# Patient Record
Sex: Female | Born: 1940
Health system: Southern US, Community
[De-identification: ages and names within clinical notes are randomized; demographics above are authoritative.]

## PROBLEM LIST (undated history)

## (undated) DIAGNOSIS — K219 Gastro-esophageal reflux disease without esophagitis: Secondary | ICD-10-CM

## (undated) DIAGNOSIS — C801 Malignant (primary) neoplasm, unspecified: Secondary | ICD-10-CM

## (undated) DIAGNOSIS — E039 Hypothyroidism, unspecified: Secondary | ICD-10-CM

## (undated) DIAGNOSIS — Z923 Personal history of irradiation: Secondary | ICD-10-CM

## (undated) DIAGNOSIS — F319 Bipolar disorder, unspecified: Secondary | ICD-10-CM

## (undated) DIAGNOSIS — C50919 Malignant neoplasm of unspecified site of unspecified female breast: Secondary | ICD-10-CM

## (undated) DIAGNOSIS — J449 Chronic obstructive pulmonary disease, unspecified: Secondary | ICD-10-CM

## (undated) DIAGNOSIS — Z950 Presence of cardiac pacemaker: Secondary | ICD-10-CM

## (undated) DIAGNOSIS — F329 Major depressive disorder, single episode, unspecified: Secondary | ICD-10-CM

## (undated) DIAGNOSIS — I2510 Atherosclerotic heart disease of native coronary artery without angina pectoris: Secondary | ICD-10-CM

## (undated) DIAGNOSIS — E119 Type 2 diabetes mellitus without complications: Secondary | ICD-10-CM

## (undated) DIAGNOSIS — E210 Primary hyperparathyroidism: Secondary | ICD-10-CM

## (undated) DIAGNOSIS — I4891 Unspecified atrial fibrillation: Secondary | ICD-10-CM

## (undated) DIAGNOSIS — K750 Abscess of liver: Secondary | ICD-10-CM

## (undated) DIAGNOSIS — E559 Vitamin D deficiency, unspecified: Secondary | ICD-10-CM

## (undated) DIAGNOSIS — F17200 Nicotine dependence, unspecified, uncomplicated: Secondary | ICD-10-CM

## (undated) DIAGNOSIS — F3289 Other specified depressive episodes: Secondary | ICD-10-CM

## (undated) DIAGNOSIS — E785 Hyperlipidemia, unspecified: Secondary | ICD-10-CM

## (undated) DIAGNOSIS — I442 Atrioventricular block, complete: Secondary | ICD-10-CM

## (undated) DIAGNOSIS — E215 Disorder of parathyroid gland, unspecified: Secondary | ICD-10-CM

## (undated) DIAGNOSIS — I5181 Takotsubo syndrome: Secondary | ICD-10-CM

## (undated) DIAGNOSIS — I10 Essential (primary) hypertension: Secondary | ICD-10-CM

## (undated) HISTORY — PX: BREAST BIOPSY: SHX20

## (undated) HISTORY — DX: Atherosclerotic heart disease of native coronary artery without angina pectoris: I25.10

## (undated) HISTORY — DX: Hypothyroidism, unspecified: E03.9

## (undated) HISTORY — DX: Type 2 diabetes mellitus without complications: E11.9

## (undated) HISTORY — DX: Takotsubo syndrome: I51.81

## (undated) HISTORY — PX: ABDOMINAL HYSTERECTOMY: SHX81

## (undated) HISTORY — DX: Other specified depressive episodes: F32.89

## (undated) HISTORY — DX: Essential (primary) hypertension: I10

## (undated) HISTORY — DX: Vitamin D deficiency, unspecified: E55.9

## (undated) HISTORY — DX: Abscess of liver: K75.0

## (undated) HISTORY — DX: Unspecified atrial fibrillation: I48.91

## (undated) HISTORY — DX: Major depressive disorder, single episode, unspecified: F32.9

## (undated) HISTORY — DX: Bipolar disorder, unspecified: F31.9

## (undated) HISTORY — DX: Chronic obstructive pulmonary disease, unspecified: J44.9

## (undated) HISTORY — DX: Hypercalcemia: E83.52

## (undated) HISTORY — PX: INSERT / REPLACE / REMOVE PACEMAKER: SUR710

## (undated) HISTORY — PX: CARDIAC CATHETERIZATION: SHX172

## (undated) HISTORY — DX: Primary hyperparathyroidism: E21.0

## (undated) HISTORY — DX: Atrioventricular block, complete: I44.2

## (undated) HISTORY — PX: PARATHYROIDECTOMY: SHX19

## (undated) HISTORY — DX: Malignant neoplasm of unspecified site of unspecified female breast: C50.919

## (undated) HISTORY — DX: Hyperlipidemia, unspecified: E78.5

## (undated) HISTORY — DX: Presence of cardiac pacemaker: Z95.0

## (undated) HISTORY — DX: Gastro-esophageal reflux disease without esophagitis: K21.9

## (undated) HISTORY — PX: BREAST LUMPECTOMY: SHX2

## (undated) HISTORY — DX: Nicotine dependence, unspecified, uncomplicated: F17.200

## (undated) HISTORY — DX: Disorder of parathyroid gland, unspecified: E21.5

---

## 1998-09-14 ENCOUNTER — Ambulatory Visit (HOSPITAL_COMMUNITY): Admission: RE | Admit: 1998-09-14 | Discharge: 1998-09-14 | Payer: Self-pay | Admitting: Endocrinology

## 1998-09-14 ENCOUNTER — Encounter: Payer: Self-pay | Admitting: Endocrinology

## 1999-05-23 ENCOUNTER — Ambulatory Visit (HOSPITAL_COMMUNITY): Admission: RE | Admit: 1999-05-23 | Discharge: 1999-05-23 | Payer: Self-pay | Admitting: Gastroenterology

## 1999-05-23 ENCOUNTER — Encounter: Payer: Self-pay | Admitting: Gastroenterology

## 1999-06-06 ENCOUNTER — Other Ambulatory Visit: Admission: RE | Admit: 1999-06-06 | Discharge: 1999-06-06 | Payer: Self-pay | Admitting: Gastroenterology

## 1999-06-06 ENCOUNTER — Encounter (INDEPENDENT_AMBULATORY_CARE_PROVIDER_SITE_OTHER): Payer: Self-pay | Admitting: Specialist

## 1999-12-26 ENCOUNTER — Ambulatory Visit (HOSPITAL_COMMUNITY): Admission: RE | Admit: 1999-12-26 | Discharge: 1999-12-27 | Payer: Self-pay | Admitting: Internal Medicine

## 1999-12-27 ENCOUNTER — Encounter: Payer: Self-pay | Admitting: Internal Medicine

## 2000-05-11 ENCOUNTER — Ambulatory Visit (HOSPITAL_COMMUNITY): Admission: RE | Admit: 2000-05-11 | Discharge: 2000-05-11 | Payer: Self-pay | Admitting: Internal Medicine

## 2000-05-11 ENCOUNTER — Encounter: Payer: Self-pay | Admitting: Internal Medicine

## 2000-07-11 ENCOUNTER — Ambulatory Visit (HOSPITAL_COMMUNITY): Admission: RE | Admit: 2000-07-11 | Discharge: 2000-07-11 | Payer: Self-pay | Admitting: Specialist

## 2000-10-01 ENCOUNTER — Ambulatory Visit (HOSPITAL_COMMUNITY): Admission: RE | Admit: 2000-10-01 | Discharge: 2000-10-01 | Payer: Self-pay | Admitting: Specialist

## 2002-04-04 ENCOUNTER — Encounter: Payer: Self-pay | Admitting: Internal Medicine

## 2002-04-04 ENCOUNTER — Inpatient Hospital Stay (HOSPITAL_COMMUNITY): Admission: RE | Admit: 2002-04-04 | Discharge: 2002-04-11 | Payer: Self-pay | Admitting: Internal Medicine

## 2002-04-05 ENCOUNTER — Encounter: Payer: Self-pay | Admitting: Internal Medicine

## 2002-04-11 ENCOUNTER — Encounter: Payer: Self-pay | Admitting: Internal Medicine

## 2002-07-08 ENCOUNTER — Ambulatory Visit (HOSPITAL_COMMUNITY): Admission: RE | Admit: 2002-07-08 | Discharge: 2002-07-08 | Payer: Self-pay | Admitting: Family Medicine

## 2003-02-03 ENCOUNTER — Encounter: Admission: RE | Admit: 2003-02-03 | Discharge: 2003-02-03 | Payer: Self-pay | Admitting: Obstetrics and Gynecology

## 2003-04-23 ENCOUNTER — Ambulatory Visit (HOSPITAL_COMMUNITY): Admission: RE | Admit: 2003-04-23 | Discharge: 2003-04-23 | Payer: Self-pay | Admitting: Internal Medicine

## 2003-04-23 ENCOUNTER — Encounter: Payer: Self-pay | Admitting: Internal Medicine

## 2003-05-08 ENCOUNTER — Encounter: Admission: RE | Admit: 2003-05-08 | Discharge: 2003-05-08 | Payer: Self-pay | Admitting: Family Medicine

## 2003-08-11 ENCOUNTER — Encounter: Admission: RE | Admit: 2003-08-11 | Discharge: 2003-08-11 | Payer: Self-pay | Admitting: Obstetrics and Gynecology

## 2004-04-12 ENCOUNTER — Ambulatory Visit: Payer: Self-pay | Admitting: Internal Medicine

## 2004-04-12 ENCOUNTER — Ambulatory Visit: Payer: Self-pay | Admitting: *Deleted

## 2004-04-13 ENCOUNTER — Ambulatory Visit: Payer: Self-pay | Admitting: Internal Medicine

## 2004-04-15 ENCOUNTER — Ambulatory Visit: Payer: Self-pay | Admitting: *Deleted

## 2004-04-15 ENCOUNTER — Ambulatory Visit: Payer: Self-pay | Admitting: Internal Medicine

## 2004-04-20 ENCOUNTER — Ambulatory Visit (HOSPITAL_COMMUNITY): Admission: RE | Admit: 2004-04-20 | Discharge: 2004-04-20 | Payer: Self-pay | Admitting: *Deleted

## 2004-05-16 ENCOUNTER — Ambulatory Visit: Payer: Self-pay | Admitting: Internal Medicine

## 2004-06-07 ENCOUNTER — Ambulatory Visit: Payer: Self-pay | Admitting: Internal Medicine

## 2004-07-07 ENCOUNTER — Ambulatory Visit: Payer: Self-pay | Admitting: Internal Medicine

## 2004-07-26 ENCOUNTER — Ambulatory Visit: Payer: Self-pay | Admitting: Internal Medicine

## 2004-08-08 ENCOUNTER — Ambulatory Visit: Payer: Self-pay | Admitting: Internal Medicine

## 2004-08-12 ENCOUNTER — Ambulatory Visit: Payer: Self-pay | Admitting: Internal Medicine

## 2004-08-19 ENCOUNTER — Ambulatory Visit: Payer: Self-pay | Admitting: Internal Medicine

## 2004-08-25 ENCOUNTER — Ambulatory Visit: Payer: Self-pay | Admitting: Internal Medicine

## 2004-09-07 ENCOUNTER — Ambulatory Visit: Payer: Self-pay | Admitting: Internal Medicine

## 2004-09-08 ENCOUNTER — Ambulatory Visit: Payer: Self-pay | Admitting: Internal Medicine

## 2004-10-05 ENCOUNTER — Ambulatory Visit: Payer: Self-pay | Admitting: Internal Medicine

## 2004-11-07 ENCOUNTER — Ambulatory Visit: Payer: Self-pay | Admitting: Internal Medicine

## 2004-12-05 ENCOUNTER — Ambulatory Visit: Payer: Self-pay | Admitting: Internal Medicine

## 2004-12-14 ENCOUNTER — Ambulatory Visit: Payer: Self-pay | Admitting: Internal Medicine

## 2004-12-19 ENCOUNTER — Ambulatory Visit: Payer: Self-pay | Admitting: Internal Medicine

## 2005-01-09 ENCOUNTER — Ambulatory Visit: Payer: Self-pay | Admitting: Internal Medicine

## 2005-01-16 ENCOUNTER — Ambulatory Visit: Payer: Self-pay | Admitting: Internal Medicine

## 2005-02-06 ENCOUNTER — Ambulatory Visit: Payer: Self-pay | Admitting: Nurse Practitioner

## 2005-02-15 ENCOUNTER — Ambulatory Visit: Payer: Self-pay | Admitting: Internal Medicine

## 2005-03-27 ENCOUNTER — Ambulatory Visit: Payer: Self-pay | Admitting: Nurse Practitioner

## 2005-04-27 ENCOUNTER — Ambulatory Visit: Payer: Self-pay | Admitting: Nurse Practitioner

## 2005-04-27 ENCOUNTER — Ambulatory Visit: Payer: Self-pay | Admitting: Internal Medicine

## 2005-05-03 ENCOUNTER — Ambulatory Visit: Payer: Self-pay | Admitting: Nurse Practitioner

## 2005-05-16 ENCOUNTER — Ambulatory Visit (HOSPITAL_COMMUNITY): Admission: RE | Admit: 2005-05-16 | Discharge: 2005-05-16 | Payer: Self-pay | Admitting: Internal Medicine

## 2005-05-18 ENCOUNTER — Ambulatory Visit: Payer: Self-pay | Admitting: Nurse Practitioner

## 2005-05-24 ENCOUNTER — Ambulatory Visit: Payer: Self-pay | Admitting: Family Medicine

## 2005-06-05 ENCOUNTER — Ambulatory Visit (HOSPITAL_COMMUNITY): Admission: RE | Admit: 2005-06-05 | Discharge: 2005-06-05 | Payer: Self-pay | Admitting: Internal Medicine

## 2005-06-19 ENCOUNTER — Ambulatory Visit: Payer: Self-pay | Admitting: Nurse Practitioner

## 2005-06-21 ENCOUNTER — Ambulatory Visit: Payer: Self-pay | Admitting: Nurse Practitioner

## 2005-07-04 ENCOUNTER — Ambulatory Visit: Payer: Self-pay | Admitting: Nurse Practitioner

## 2005-07-19 ENCOUNTER — Ambulatory Visit: Payer: Self-pay | Admitting: Nurse Practitioner

## 2005-07-20 ENCOUNTER — Ambulatory Visit: Payer: Self-pay | Admitting: Nurse Practitioner

## 2005-08-03 ENCOUNTER — Ambulatory Visit: Payer: Self-pay | Admitting: Nurse Practitioner

## 2005-08-10 ENCOUNTER — Ambulatory Visit: Payer: Self-pay | Admitting: Internal Medicine

## 2005-08-12 ENCOUNTER — Emergency Department (HOSPITAL_COMMUNITY): Admission: EM | Admit: 2005-08-12 | Discharge: 2005-08-12 | Payer: Self-pay | Admitting: Emergency Medicine

## 2005-08-14 ENCOUNTER — Ambulatory Visit: Payer: Self-pay | Admitting: Nurse Practitioner

## 2005-08-17 ENCOUNTER — Ambulatory Visit: Payer: Self-pay | Admitting: Nurse Practitioner

## 2005-08-28 ENCOUNTER — Ambulatory Visit: Payer: Self-pay | Admitting: Internal Medicine

## 2005-09-04 ENCOUNTER — Ambulatory Visit (HOSPITAL_COMMUNITY): Admission: RE | Admit: 2005-09-04 | Discharge: 2005-09-04 | Payer: Self-pay | Admitting: *Deleted

## 2005-09-04 ENCOUNTER — Ambulatory Visit: Payer: Self-pay | Admitting: "Endocrinology

## 2005-09-08 ENCOUNTER — Ambulatory Visit: Payer: Self-pay | Admitting: Nurse Practitioner

## 2005-09-11 ENCOUNTER — Encounter (HOSPITAL_COMMUNITY): Admission: RE | Admit: 2005-09-11 | Discharge: 2005-12-10 | Payer: Self-pay | Admitting: "Endocrinology

## 2005-09-25 ENCOUNTER — Ambulatory Visit: Payer: Self-pay | Admitting: Nurse Practitioner

## 2005-10-10 ENCOUNTER — Ambulatory Visit: Payer: Self-pay | Admitting: "Endocrinology

## 2005-10-16 ENCOUNTER — Ambulatory Visit: Payer: Self-pay | Admitting: Nurse Practitioner

## 2005-10-17 ENCOUNTER — Ambulatory Visit: Payer: Self-pay | Admitting: Nurse Practitioner

## 2005-11-02 ENCOUNTER — Ambulatory Visit: Payer: Self-pay | Admitting: Internal Medicine

## 2005-11-16 ENCOUNTER — Ambulatory Visit (HOSPITAL_COMMUNITY): Admission: RE | Admit: 2005-11-16 | Discharge: 2005-11-16 | Payer: Self-pay | Admitting: Internal Medicine

## 2005-11-16 ENCOUNTER — Ambulatory Visit: Payer: Self-pay | Admitting: Cardiology

## 2005-11-21 ENCOUNTER — Ambulatory Visit: Payer: Self-pay | Admitting: Nurse Practitioner

## 2005-12-07 ENCOUNTER — Ambulatory Visit (HOSPITAL_COMMUNITY): Admission: RE | Admit: 2005-12-07 | Discharge: 2005-12-08 | Payer: Self-pay | Admitting: Surgery

## 2005-12-07 ENCOUNTER — Encounter (INDEPENDENT_AMBULATORY_CARE_PROVIDER_SITE_OTHER): Payer: Self-pay | Admitting: *Deleted

## 2005-12-20 ENCOUNTER — Ambulatory Visit: Payer: Self-pay | Admitting: "Endocrinology

## 2005-12-21 ENCOUNTER — Ambulatory Visit: Payer: Self-pay | Admitting: Nurse Practitioner

## 2005-12-26 ENCOUNTER — Ambulatory Visit (HOSPITAL_COMMUNITY): Admission: RE | Admit: 2005-12-26 | Discharge: 2005-12-26 | Payer: Self-pay | Admitting: "Endocrinology

## 2006-01-23 ENCOUNTER — Ambulatory Visit: Payer: Self-pay | Admitting: Cardiology

## 2006-01-30 ENCOUNTER — Ambulatory Visit: Payer: Self-pay | Admitting: Gastroenterology

## 2006-01-31 ENCOUNTER — Ambulatory Visit: Payer: Self-pay | Admitting: Internal Medicine

## 2006-02-05 ENCOUNTER — Ambulatory Visit: Payer: Self-pay | Admitting: Cardiovascular Disease

## 2006-02-05 ENCOUNTER — Ambulatory Visit: Payer: Self-pay | Admitting: Internal Medicine

## 2006-02-23 ENCOUNTER — Ambulatory Visit: Payer: Self-pay | Admitting: Gastroenterology

## 2006-02-28 ENCOUNTER — Ambulatory Visit: Payer: Self-pay | Admitting: Gastroenterology

## 2006-02-28 ENCOUNTER — Encounter (INDEPENDENT_AMBULATORY_CARE_PROVIDER_SITE_OTHER): Payer: Self-pay | Admitting: *Deleted

## 2006-02-28 LAB — HM COLONOSCOPY

## 2006-03-06 ENCOUNTER — Ambulatory Visit: Payer: Self-pay | Admitting: Cardiology

## 2006-03-07 ENCOUNTER — Ambulatory Visit: Payer: Self-pay | Admitting: "Endocrinology

## 2006-04-02 ENCOUNTER — Ambulatory Visit: Payer: Self-pay | Admitting: Internal Medicine

## 2006-04-03 ENCOUNTER — Ambulatory Visit: Payer: Self-pay | Admitting: *Deleted

## 2006-04-16 ENCOUNTER — Ambulatory Visit: Payer: Self-pay | Admitting: Internal Medicine

## 2006-04-26 ENCOUNTER — Ambulatory Visit: Payer: Self-pay | Admitting: Gastroenterology

## 2006-05-01 ENCOUNTER — Ambulatory Visit: Payer: Self-pay | Admitting: Cardiovascular Disease

## 2006-05-16 ENCOUNTER — Ambulatory Visit: Payer: Self-pay | Admitting: Cardiology

## 2006-05-25 ENCOUNTER — Ambulatory Visit: Payer: Self-pay | Admitting: Internal Medicine

## 2006-05-25 ENCOUNTER — Ambulatory Visit: Payer: Self-pay | Admitting: Cardiology

## 2006-05-25 LAB — CONVERTED CEMR LAB
Chol/HDL Ratio, serum: 6.2
Cholesterol: 270 mg/dL (ref 0–200)
HDL: 43.4 mg/dL (ref >39.0)
Triglyceride fasting, serum: 414 mg/dL (ref 0–149)
VLDL: 83 mg/dL — ABNORMAL HIGH (ref 0–40)

## 2006-06-07 ENCOUNTER — Ambulatory Visit: Payer: Self-pay | Admitting: "Endocrinology

## 2006-06-08 ENCOUNTER — Ambulatory Visit: Payer: Self-pay | Admitting: Cardiology

## 2006-06-19 ENCOUNTER — Ambulatory Visit (HOSPITAL_COMMUNITY): Admission: RE | Admit: 2006-06-19 | Discharge: 2006-06-19 | Payer: Self-pay | Admitting: Internal Medicine

## 2006-06-29 ENCOUNTER — Ambulatory Visit: Payer: Self-pay | Admitting: Cardiology

## 2006-07-02 ENCOUNTER — Encounter: Admission: RE | Admit: 2006-07-02 | Discharge: 2006-07-02 | Payer: Self-pay | Admitting: Internal Medicine

## 2006-07-05 ENCOUNTER — Ambulatory Visit: Payer: Self-pay | Admitting: Internal Medicine

## 2006-07-05 ENCOUNTER — Ambulatory Visit (HOSPITAL_COMMUNITY): Admission: RE | Admit: 2006-07-05 | Discharge: 2006-07-05 | Payer: Self-pay | Admitting: Internal Medicine

## 2006-07-12 ENCOUNTER — Ambulatory Visit: Payer: Self-pay | Admitting: Cardiovascular Disease

## 2006-07-27 ENCOUNTER — Ambulatory Visit: Payer: Self-pay | Admitting: Cardiology

## 2006-07-27 ENCOUNTER — Ambulatory Visit: Payer: Self-pay

## 2006-08-16 ENCOUNTER — Ambulatory Visit: Payer: Self-pay | Admitting: Cardiology

## 2006-09-06 ENCOUNTER — Ambulatory Visit: Payer: Self-pay | Admitting: Cardiology

## 2006-09-14 ENCOUNTER — Ambulatory Visit: Payer: Self-pay | Admitting: "Endocrinology

## 2006-09-27 ENCOUNTER — Ambulatory Visit: Payer: Self-pay | Admitting: Cardiology

## 2006-10-18 ENCOUNTER — Ambulatory Visit: Payer: Self-pay | Admitting: Cardiology

## 2006-10-31 ENCOUNTER — Ambulatory Visit: Payer: Self-pay | Admitting: Internal Medicine

## 2006-11-15 ENCOUNTER — Ambulatory Visit: Payer: Self-pay | Admitting: Internal Medicine

## 2006-11-21 ENCOUNTER — Ambulatory Visit: Payer: Self-pay | Admitting: Internal Medicine

## 2006-11-29 ENCOUNTER — Ambulatory Visit: Payer: Self-pay | Admitting: Cardiology

## 2006-12-13 ENCOUNTER — Ambulatory Visit: Payer: Self-pay | Admitting: "Endocrinology

## 2006-12-13 ENCOUNTER — Ambulatory Visit: Payer: Self-pay | Admitting: Cardiology

## 2006-12-27 ENCOUNTER — Ambulatory Visit: Payer: Self-pay | Admitting: Cardiology

## 2006-12-28 ENCOUNTER — Emergency Department (HOSPITAL_COMMUNITY): Admission: EM | Admit: 2006-12-28 | Discharge: 2006-12-28 | Payer: Self-pay | Admitting: *Deleted

## 2006-12-29 ENCOUNTER — Encounter: Payer: Self-pay | Admitting: Internal Medicine

## 2006-12-29 ENCOUNTER — Inpatient Hospital Stay (HOSPITAL_COMMUNITY): Admission: EM | Admit: 2006-12-29 | Discharge: 2007-01-02 | Payer: Self-pay | Admitting: Emergency Medicine

## 2006-12-29 ENCOUNTER — Ambulatory Visit: Payer: Self-pay | Admitting: *Deleted

## 2007-01-05 ENCOUNTER — Ambulatory Visit: Payer: Self-pay | Admitting: Family Medicine

## 2007-01-08 ENCOUNTER — Ambulatory Visit: Payer: Self-pay | Admitting: Cardiology

## 2007-01-18 ENCOUNTER — Encounter: Payer: Self-pay | Admitting: Internal Medicine

## 2007-01-18 ENCOUNTER — Ambulatory Visit: Payer: Self-pay

## 2007-01-23 ENCOUNTER — Ambulatory Visit: Payer: Self-pay | Admitting: Cardiology

## 2007-02-06 ENCOUNTER — Ambulatory Visit: Payer: Self-pay | Admitting: Cardiology

## 2007-02-06 ENCOUNTER — Ambulatory Visit: Payer: Self-pay | Admitting: Internal Medicine

## 2007-02-27 ENCOUNTER — Ambulatory Visit: Payer: Self-pay | Admitting: Cardiology

## 2007-03-27 ENCOUNTER — Ambulatory Visit: Payer: Self-pay | Admitting: Cardiology

## 2007-04-04 ENCOUNTER — Ambulatory Visit: Payer: Self-pay | Admitting: "Endocrinology

## 2007-04-19 ENCOUNTER — Ambulatory Visit: Payer: Self-pay | Admitting: Internal Medicine

## 2007-04-24 ENCOUNTER — Ambulatory Visit: Payer: Self-pay | Admitting: Internal Medicine

## 2007-05-16 ENCOUNTER — Ambulatory Visit: Payer: Self-pay | Admitting: Cardiology

## 2007-05-31 ENCOUNTER — Ambulatory Visit: Payer: Self-pay | Admitting: Cardiovascular Disease

## 2007-06-28 ENCOUNTER — Ambulatory Visit: Payer: Self-pay | Admitting: Internal Medicine

## 2007-07-12 ENCOUNTER — Ambulatory Visit: Payer: Self-pay | Admitting: Cardiology

## 2007-07-17 ENCOUNTER — Ambulatory Visit: Payer: Self-pay | Admitting: Internal Medicine

## 2007-07-23 ENCOUNTER — Ambulatory Visit: Payer: Self-pay | Admitting: "Endocrinology

## 2007-08-13 ENCOUNTER — Ambulatory Visit: Payer: Self-pay | Admitting: Cardiology

## 2007-09-03 ENCOUNTER — Ambulatory Visit: Payer: Self-pay | Admitting: Cardiovascular Disease

## 2007-09-11 ENCOUNTER — Ambulatory Visit: Payer: Self-pay | Admitting: Cardiology

## 2007-09-11 ENCOUNTER — Inpatient Hospital Stay (HOSPITAL_COMMUNITY): Admission: EM | Admit: 2007-09-11 | Discharge: 2007-09-17 | Payer: Self-pay | Admitting: Emergency Medicine

## 2007-09-12 ENCOUNTER — Encounter: Payer: Self-pay | Admitting: Cardiology

## 2007-10-02 ENCOUNTER — Ambulatory Visit: Payer: Self-pay | Admitting: Cardiology

## 2007-10-16 ENCOUNTER — Ambulatory Visit: Payer: Self-pay | Admitting: Cardiology

## 2007-11-06 ENCOUNTER — Ambulatory Visit: Payer: Self-pay | Admitting: "Endocrinology

## 2007-11-18 ENCOUNTER — Ambulatory Visit: Payer: Self-pay | Admitting: Internal Medicine

## 2007-11-20 ENCOUNTER — Ambulatory Visit: Payer: Self-pay | Admitting: Cardiology

## 2007-12-16 ENCOUNTER — Ambulatory Visit: Payer: Self-pay | Admitting: Cardiology

## 2007-12-17 ENCOUNTER — Encounter: Payer: Self-pay | Admitting: Internal Medicine

## 2008-01-01 ENCOUNTER — Encounter: Payer: Self-pay | Admitting: Internal Medicine

## 2008-01-13 ENCOUNTER — Ambulatory Visit: Payer: Self-pay | Admitting: Cardiology

## 2008-01-24 ENCOUNTER — Encounter: Payer: Self-pay | Admitting: Internal Medicine

## 2008-01-27 ENCOUNTER — Ambulatory Visit: Payer: Self-pay | Admitting: Cardiology

## 2008-02-17 ENCOUNTER — Ambulatory Visit: Payer: Self-pay | Admitting: Internal Medicine

## 2008-02-20 ENCOUNTER — Ambulatory Visit: Payer: Self-pay | Admitting: "Endocrinology

## 2008-02-23 ENCOUNTER — Encounter: Payer: Self-pay | Admitting: Gastroenterology

## 2008-02-24 ENCOUNTER — Encounter: Payer: Self-pay | Admitting: Gastroenterology

## 2008-02-25 ENCOUNTER — Telehealth (INDEPENDENT_AMBULATORY_CARE_PROVIDER_SITE_OTHER): Payer: Self-pay | Admitting: *Deleted

## 2008-02-25 ENCOUNTER — Inpatient Hospital Stay (HOSPITAL_COMMUNITY): Admission: AD | Admit: 2008-02-25 | Discharge: 2008-03-06 | Payer: Self-pay | Admitting: Internal Medicine

## 2008-02-25 ENCOUNTER — Ambulatory Visit: Payer: Self-pay | Admitting: Gastroenterology

## 2008-02-25 ENCOUNTER — Ambulatory Visit: Payer: Self-pay | Admitting: Cardiology

## 2008-02-25 DIAGNOSIS — K5730 Diverticulosis of large intestine without perforation or abscess without bleeding: Secondary | ICD-10-CM | POA: Insufficient documentation

## 2008-02-25 DIAGNOSIS — E039 Hypothyroidism, unspecified: Secondary | ICD-10-CM | POA: Insufficient documentation

## 2008-02-25 DIAGNOSIS — I10 Essential (primary) hypertension: Secondary | ICD-10-CM | POA: Insufficient documentation

## 2008-02-25 DIAGNOSIS — F319 Bipolar disorder, unspecified: Secondary | ICD-10-CM | POA: Insufficient documentation

## 2008-02-25 DIAGNOSIS — E785 Hyperlipidemia, unspecified: Secondary | ICD-10-CM | POA: Insufficient documentation

## 2008-02-26 ENCOUNTER — Ambulatory Visit: Payer: Self-pay | Admitting: Infectious Disease

## 2008-02-26 ENCOUNTER — Encounter: Payer: Self-pay | Admitting: Internal Medicine

## 2008-03-02 ENCOUNTER — Ambulatory Visit: Payer: Self-pay | Admitting: Internal Medicine

## 2008-03-05 ENCOUNTER — Encounter: Payer: Self-pay | Admitting: Internal Medicine

## 2008-03-06 ENCOUNTER — Encounter (INDEPENDENT_AMBULATORY_CARE_PROVIDER_SITE_OTHER): Payer: Self-pay | Admitting: Physician Assistant

## 2008-03-09 ENCOUNTER — Emergency Department (HOSPITAL_COMMUNITY): Admission: EM | Admit: 2008-03-09 | Discharge: 2008-03-09 | Payer: Self-pay | Admitting: Emergency Medicine

## 2008-03-17 ENCOUNTER — Ambulatory Visit: Payer: Self-pay | Admitting: Cardiovascular Disease

## 2008-03-17 ENCOUNTER — Telehealth: Payer: Self-pay | Admitting: Gastroenterology

## 2008-03-23 ENCOUNTER — Ambulatory Visit: Payer: Self-pay | Admitting: Internal Medicine

## 2008-03-23 ENCOUNTER — Encounter: Payer: Self-pay | Admitting: Gastroenterology

## 2008-03-23 ENCOUNTER — Ambulatory Visit: Payer: Self-pay | Admitting: Cardiology

## 2008-03-26 ENCOUNTER — Ambulatory Visit: Payer: Self-pay | Admitting: Gastroenterology

## 2008-03-26 DIAGNOSIS — I2510 Atherosclerotic heart disease of native coronary artery without angina pectoris: Secondary | ICD-10-CM | POA: Insufficient documentation

## 2008-03-26 DIAGNOSIS — Z9861 Coronary angioplasty status: Secondary | ICD-10-CM

## 2008-03-26 DIAGNOSIS — I442 Atrioventricular block, complete: Secondary | ICD-10-CM | POA: Insufficient documentation

## 2008-03-26 LAB — CONVERTED CEMR LAB
Basophils Absolute: 0.1 10*3/uL (ref 0.0–0.1)
Basophils Relative: 1.5 % (ref 0.0–3.0)
HCT: 38.2 % (ref 36.0–46.0)
Hemoglobin: 13.5 g/dL (ref 12.0–15.0)
MCHC: 35.4 g/dL (ref 30.0–36.0)
Monocytes Absolute: 0.5 10*3/uL (ref 0.1–1.0)
Neutro Abs: 4.6 10*3/uL (ref 1.4–7.7)
RBC: 4.22 M/uL (ref 3.87–5.11)
RDW: 13.1 % (ref 11.5–14.6)
Sed Rate: 46 mm/hr — ABNORMAL HIGH (ref 0–22)

## 2008-03-27 ENCOUNTER — Telehealth: Payer: Self-pay | Admitting: Gastroenterology

## 2008-03-30 ENCOUNTER — Ambulatory Visit: Payer: Self-pay | Admitting: Internal Medicine

## 2008-04-06 ENCOUNTER — Ambulatory Visit: Payer: Self-pay | Admitting: Cardiology

## 2008-04-06 ENCOUNTER — Telehealth: Payer: Self-pay | Admitting: Gastroenterology

## 2008-04-09 ENCOUNTER — Ambulatory Visit: Payer: Self-pay | Admitting: Gastroenterology

## 2008-04-14 ENCOUNTER — Ambulatory Visit: Payer: Self-pay

## 2008-04-14 ENCOUNTER — Ambulatory Visit: Payer: Self-pay | Admitting: Cardiology

## 2008-05-05 ENCOUNTER — Ambulatory Visit: Payer: Self-pay | Admitting: Cardiology

## 2008-06-02 ENCOUNTER — Ambulatory Visit: Payer: Self-pay | Admitting: Cardiovascular Disease

## 2008-06-11 ENCOUNTER — Telehealth: Payer: Self-pay | Admitting: Gastroenterology

## 2008-06-30 ENCOUNTER — Ambulatory Visit: Payer: Self-pay | Admitting: Cardiovascular Disease

## 2008-07-03 ENCOUNTER — Telehealth: Payer: Self-pay | Admitting: Internal Medicine

## 2008-07-07 ENCOUNTER — Ambulatory Visit: Payer: Self-pay | Admitting: "Endocrinology

## 2008-07-07 ENCOUNTER — Telehealth: Payer: Self-pay | Admitting: Gastroenterology

## 2008-07-14 ENCOUNTER — Ambulatory Visit: Payer: Self-pay | Admitting: Internal Medicine

## 2008-07-28 ENCOUNTER — Ambulatory Visit: Payer: Self-pay | Admitting: Cardiovascular Disease

## 2008-08-25 ENCOUNTER — Ambulatory Visit: Payer: Self-pay | Admitting: Cardiology

## 2008-09-14 ENCOUNTER — Ambulatory Visit: Payer: Self-pay | Admitting: Cardiology

## 2008-09-28 ENCOUNTER — Ambulatory Visit: Payer: Self-pay | Admitting: Cardiovascular Disease

## 2008-10-08 ENCOUNTER — Ambulatory Visit: Payer: Self-pay | Admitting: Cardiovascular Disease

## 2008-10-22 ENCOUNTER — Ambulatory Visit: Payer: Self-pay | Admitting: Internal Medicine

## 2008-11-03 ENCOUNTER — Encounter: Payer: Self-pay | Admitting: Internal Medicine

## 2008-11-10 ENCOUNTER — Encounter: Payer: Self-pay | Admitting: Internal Medicine

## 2008-11-10 ENCOUNTER — Ambulatory Visit: Payer: Self-pay | Admitting: Internal Medicine

## 2008-11-12 ENCOUNTER — Ambulatory Visit: Payer: Self-pay | Admitting: Internal Medicine

## 2008-12-03 ENCOUNTER — Ambulatory Visit: Payer: Self-pay | Admitting: Internal Medicine

## 2008-12-31 ENCOUNTER — Ambulatory Visit: Payer: Self-pay | Admitting: Cardiovascular Disease

## 2008-12-31 LAB — CONVERTED CEMR LAB: POC INR: 2.3

## 2009-01-05 ENCOUNTER — Encounter: Payer: Self-pay | Admitting: *Deleted

## 2009-01-28 ENCOUNTER — Ambulatory Visit: Payer: Self-pay | Admitting: Cardiology

## 2009-01-28 LAB — CONVERTED CEMR LAB: POC INR: 1.9

## 2009-02-10 ENCOUNTER — Encounter: Payer: Self-pay | Admitting: *Deleted

## 2009-02-12 ENCOUNTER — Encounter: Payer: Self-pay | Admitting: Internal Medicine

## 2009-02-16 ENCOUNTER — Ambulatory Visit: Payer: Self-pay | Admitting: Internal Medicine

## 2009-02-22 ENCOUNTER — Encounter: Payer: Self-pay | Admitting: Internal Medicine

## 2009-02-22 ENCOUNTER — Ambulatory Visit: Payer: Self-pay | Admitting: Cardiology

## 2009-02-22 LAB — CONVERTED CEMR LAB
POC INR: 2.4
Prothrombin Time: 18.7 s

## 2009-03-04 ENCOUNTER — Telehealth: Payer: Self-pay | Admitting: Internal Medicine

## 2009-03-04 ENCOUNTER — Ambulatory Visit: Payer: Self-pay | Admitting: "Endocrinology

## 2009-03-09 ENCOUNTER — Telehealth: Payer: Self-pay | Admitting: Internal Medicine

## 2009-03-22 ENCOUNTER — Ambulatory Visit: Payer: Self-pay | Admitting: Cardiology

## 2009-03-22 ENCOUNTER — Telehealth: Payer: Self-pay | Admitting: Internal Medicine

## 2009-03-22 ENCOUNTER — Ambulatory Visit: Payer: Self-pay | Admitting: Internal Medicine

## 2009-03-22 DIAGNOSIS — F17200 Nicotine dependence, unspecified, uncomplicated: Secondary | ICD-10-CM | POA: Insufficient documentation

## 2009-03-25 ENCOUNTER — Telehealth (INDEPENDENT_AMBULATORY_CARE_PROVIDER_SITE_OTHER): Payer: Self-pay | Admitting: Radiology

## 2009-03-29 ENCOUNTER — Encounter: Payer: Self-pay | Admitting: Cardiology

## 2009-03-29 ENCOUNTER — Ambulatory Visit: Payer: Self-pay

## 2009-04-05 ENCOUNTER — Ambulatory Visit: Payer: Self-pay | Admitting: Cardiology

## 2009-04-05 LAB — CONVERTED CEMR LAB: POC INR: 2.6

## 2009-04-15 ENCOUNTER — Encounter: Payer: Self-pay | Admitting: Internal Medicine

## 2009-04-15 ENCOUNTER — Telehealth: Payer: Self-pay | Admitting: Internal Medicine

## 2009-04-19 ENCOUNTER — Ambulatory Visit (HOSPITAL_COMMUNITY): Admission: RE | Admit: 2009-04-19 | Discharge: 2009-04-19 | Payer: Self-pay | Admitting: Cardiology

## 2009-04-19 ENCOUNTER — Telehealth (INDEPENDENT_AMBULATORY_CARE_PROVIDER_SITE_OTHER): Payer: Self-pay | Admitting: *Deleted

## 2009-04-22 ENCOUNTER — Ambulatory Visit (HOSPITAL_COMMUNITY): Admission: RE | Admit: 2009-04-22 | Discharge: 2009-04-22 | Payer: Self-pay | Admitting: Cardiovascular Disease

## 2009-04-22 ENCOUNTER — Ambulatory Visit: Payer: Self-pay | Admitting: Cardiovascular Disease

## 2009-04-26 ENCOUNTER — Telehealth (INDEPENDENT_AMBULATORY_CARE_PROVIDER_SITE_OTHER): Payer: Self-pay | Admitting: *Deleted

## 2009-04-27 ENCOUNTER — Telehealth (INDEPENDENT_AMBULATORY_CARE_PROVIDER_SITE_OTHER): Payer: Self-pay | Admitting: *Deleted

## 2009-04-29 ENCOUNTER — Ambulatory Visit: Payer: Self-pay | Admitting: Internal Medicine

## 2009-04-29 LAB — CONVERTED CEMR LAB: POC INR: 1.6

## 2009-04-30 ENCOUNTER — Telehealth (INDEPENDENT_AMBULATORY_CARE_PROVIDER_SITE_OTHER): Payer: Self-pay | Admitting: *Deleted

## 2009-05-13 ENCOUNTER — Ambulatory Visit: Payer: Self-pay | Admitting: Internal Medicine

## 2009-05-21 ENCOUNTER — Encounter: Payer: Self-pay | Admitting: Internal Medicine

## 2009-05-28 ENCOUNTER — Encounter: Payer: Self-pay | Admitting: Internal Medicine

## 2009-05-31 ENCOUNTER — Ambulatory Visit: Payer: Self-pay | Admitting: Internal Medicine

## 2009-06-07 ENCOUNTER — Ambulatory Visit: Payer: Self-pay | Admitting: Internal Medicine

## 2009-06-14 ENCOUNTER — Encounter: Payer: Self-pay | Admitting: Internal Medicine

## 2009-07-05 ENCOUNTER — Ambulatory Visit: Payer: Self-pay | Admitting: Cardiology

## 2009-07-08 ENCOUNTER — Telehealth (INDEPENDENT_AMBULATORY_CARE_PROVIDER_SITE_OTHER): Payer: Self-pay | Admitting: *Deleted

## 2009-08-05 ENCOUNTER — Ambulatory Visit: Payer: Self-pay | Admitting: Internal Medicine

## 2009-08-05 LAB — CONVERTED CEMR LAB: POC INR: 2.2

## 2009-08-11 ENCOUNTER — Ambulatory Visit: Payer: Self-pay | Admitting: Internal Medicine

## 2009-08-11 DIAGNOSIS — F329 Major depressive disorder, single episode, unspecified: Secondary | ICD-10-CM | POA: Insufficient documentation

## 2009-08-11 DIAGNOSIS — Z860100 Personal history of colon polyps, unspecified: Secondary | ICD-10-CM | POA: Insufficient documentation

## 2009-08-11 DIAGNOSIS — K750 Abscess of liver: Secondary | ICD-10-CM | POA: Insufficient documentation

## 2009-08-11 DIAGNOSIS — M25529 Pain in unspecified elbow: Secondary | ICD-10-CM | POA: Insufficient documentation

## 2009-08-11 LAB — CONVERTED CEMR LAB
ALT: 22 units/L (ref 0–35)
Bilirubin, Direct: 0.1 mg/dL (ref 0.0–0.3)
Direct LDL: 77.5 mg/dL
HDL: 50.8 mg/dL (ref >39.00)
Total Protein: 7.1 g/dL (ref 6.0–8.3)
Triglycerides: 208 mg/dL — ABNORMAL HIGH (ref 0.0–149.0)
VLDL: 41.6 mg/dL — ABNORMAL HIGH (ref 0.0–40.0)

## 2009-08-30 ENCOUNTER — Ambulatory Visit: Payer: Self-pay | Admitting: "Endocrinology

## 2009-09-02 ENCOUNTER — Ambulatory Visit: Payer: Self-pay | Admitting: Cardiology

## 2009-09-02 LAB — CONVERTED CEMR LAB: POC INR: 2

## 2009-09-07 ENCOUNTER — Encounter: Payer: Self-pay | Admitting: Internal Medicine

## 2009-09-17 ENCOUNTER — Ambulatory Visit: Payer: Self-pay | Admitting: Internal Medicine

## 2009-09-23 ENCOUNTER — Ambulatory Visit: Payer: Self-pay | Admitting: Cardiology

## 2009-09-23 LAB — CONVERTED CEMR LAB: POC INR: 2.3

## 2009-10-01 ENCOUNTER — Encounter: Payer: Self-pay | Admitting: Internal Medicine

## 2009-10-18 ENCOUNTER — Telehealth (INDEPENDENT_AMBULATORY_CARE_PROVIDER_SITE_OTHER): Payer: Self-pay | Admitting: *Deleted

## 2009-10-21 ENCOUNTER — Ambulatory Visit: Payer: Self-pay | Admitting: Internal Medicine

## 2009-11-08 ENCOUNTER — Ambulatory Visit: Payer: Self-pay | Admitting: Internal Medicine

## 2009-11-08 DIAGNOSIS — M76899 Other specified enthesopathies of unspecified lower limb, excluding foot: Secondary | ICD-10-CM | POA: Insufficient documentation

## 2009-11-11 ENCOUNTER — Ambulatory Visit: Payer: Self-pay | Admitting: Internal Medicine

## 2009-11-11 DIAGNOSIS — I4891 Unspecified atrial fibrillation: Secondary | ICD-10-CM | POA: Insufficient documentation

## 2009-11-17 ENCOUNTER — Encounter: Payer: Self-pay | Admitting: Internal Medicine

## 2009-11-17 ENCOUNTER — Telehealth: Payer: Self-pay | Admitting: Internal Medicine

## 2009-11-22 ENCOUNTER — Ambulatory Visit: Payer: Self-pay | Admitting: Internal Medicine

## 2009-11-24 LAB — CONVERTED CEMR LAB
BUN: 14 mg/dL (ref 6–23)
Basophils Absolute: 0.1 10*3/uL (ref 0.0–0.1)
Calcium: 9.4 mg/dL (ref 8.4–10.5)
Creatinine, Ser: 1.1 mg/dL (ref 0.4–1.2)
Eosinophils Relative: 1.1 % (ref 0.0–5.0)
GFR calc non Af Amer: 52.34 mL/min (ref >60)
Glucose, Bld: 184 mg/dL — ABNORMAL HIGH (ref 70–99)
HCT: 38.1 % (ref 36.0–46.0)
Lymphocytes Relative: 25.6 % (ref 12.0–46.0)
Lymphs Abs: 3.3 10*3/uL (ref 0.7–4.0)
Monocytes Relative: 4.1 % (ref 3.0–12.0)
Neutrophils Relative %: 68.7 % (ref 43.0–77.0)
Platelets: 208 10*3/uL (ref 150.0–400.0)
Potassium: 3.7 meq/L (ref 3.5–5.1)
RDW: 13.1 % (ref 11.5–14.6)
WBC: 12.8 10*3/uL — ABNORMAL HIGH (ref 4.5–10.5)

## 2010-01-17 ENCOUNTER — Ambulatory Visit: Payer: Self-pay | Admitting: Internal Medicine

## 2010-01-17 DIAGNOSIS — R259 Unspecified abnormal involuntary movements: Secondary | ICD-10-CM | POA: Insufficient documentation

## 2010-01-17 DIAGNOSIS — D179 Benign lipomatous neoplasm, unspecified: Secondary | ICD-10-CM | POA: Insufficient documentation

## 2010-01-17 LAB — CONVERTED CEMR LAB: TSH: 2.67 microintl units/mL (ref 0.35–5.50)

## 2010-01-18 LAB — CONVERTED CEMR LAB: Lithium Lvl: 0.84 meq/L (ref 0.80–1.40)

## 2010-01-19 ENCOUNTER — Telehealth: Payer: Self-pay | Admitting: Internal Medicine

## 2010-01-21 ENCOUNTER — Telehealth: Payer: Self-pay | Admitting: Internal Medicine

## 2010-01-25 ENCOUNTER — Encounter: Payer: Self-pay | Admitting: Internal Medicine

## 2010-02-11 ENCOUNTER — Encounter: Payer: Self-pay | Admitting: Internal Medicine

## 2010-02-18 ENCOUNTER — Ambulatory Visit: Payer: Self-pay | Admitting: Internal Medicine

## 2010-02-19 ENCOUNTER — Encounter: Payer: Self-pay | Admitting: Internal Medicine

## 2010-02-21 ENCOUNTER — Ambulatory Visit: Payer: Self-pay | Admitting: "Endocrinology

## 2010-03-04 ENCOUNTER — Encounter: Payer: Self-pay | Admitting: Internal Medicine

## 2010-03-08 ENCOUNTER — Encounter: Payer: Self-pay | Admitting: Internal Medicine

## 2010-03-08 ENCOUNTER — Ambulatory Visit: Payer: Self-pay | Admitting: Internal Medicine

## 2010-05-25 ENCOUNTER — Encounter: Payer: Self-pay | Admitting: Internal Medicine

## 2010-05-26 ENCOUNTER — Encounter: Payer: Self-pay | Admitting: Internal Medicine

## 2010-05-26 ENCOUNTER — Ambulatory Visit: Payer: Self-pay | Admitting: Internal Medicine

## 2010-05-26 ENCOUNTER — Telehealth: Payer: Self-pay | Admitting: Internal Medicine

## 2010-06-02 ENCOUNTER — Telehealth: Payer: Self-pay | Admitting: Internal Medicine

## 2010-06-24 ENCOUNTER — Encounter: Payer: Self-pay | Admitting: Internal Medicine

## 2010-06-24 LAB — CONVERTED CEMR LAB
CO2: 27 meq/L
Calcium: 9.8 mg/dL
Chloride: 104 meq/L
Creatinine, Ser: 0.87 mg/dL
Sodium: 138 meq/L

## 2010-07-28 ENCOUNTER — Ambulatory Visit: Payer: Self-pay | Admitting: "Endocrinology

## 2010-08-18 ENCOUNTER — Telehealth: Payer: Self-pay | Admitting: Internal Medicine

## 2010-08-27 ENCOUNTER — Encounter (INDEPENDENT_AMBULATORY_CARE_PROVIDER_SITE_OTHER): Payer: Self-pay | Admitting: *Deleted

## 2010-08-28 ENCOUNTER — Encounter: Payer: Self-pay | Admitting: Internal Medicine

## 2010-09-01 ENCOUNTER — Encounter: Payer: Self-pay | Admitting: Internal Medicine

## 2010-09-01 ENCOUNTER — Ambulatory Visit
Admission: RE | Admit: 2010-09-01 | Discharge: 2010-09-01 | Payer: Self-pay | Source: Home / Self Care | Attending: Internal Medicine | Admitting: Internal Medicine

## 2010-09-06 NOTE — Progress Notes (Signed)
Summary: Shaking   Phone Note Call from Patient Call back at [facility] Phone [phone]   Caller: Patient Summary of Call: Pt called stating that she was unclear on why if anything MD wanted to do about her shaking. I called pt back to advise per pt instructions MD stated: i suspect lithium may be what is causing your tremors - as long as the lithium levels are in good range, the occasssional tremor is nothing to worry about - will check labs today for this. Labs were within range and message was left on PT stating same. Pt was unavailable to take my call but person who answered will tell pt to call [facility] Pt called back and I advised of above but she stated that she is not having an "occasional" tremor, shaking is severe mostly in the morning every other day and she is still concerned, please advise. Initial call taken by: Margaret Pyle, CMA,  January 21, 2010 10:57 AM  Follow-up for Phone Call        refer to neuro for eval and tx of same- Follow-up by: Newt Lukes MD,  January 21, 2010 12:16 PM  Additional Follow-up for Phone Call Additional follow up Details #1::        left mess to call office back...............Marland KitchenLamar Sprinkles, CMA  January 21, 2010 12:34 PM   Pt informed and will expect a call from [facility] Additional Follow-up by: Margaret Pyle, CMA,  January 21, 2010 2:58 PM

## 2010-09-06 NOTE — Progress Notes (Signed)
----   Converted from flag ---- ---- 07/07/2009 1:32 PM, Newt Lukes MD wrote: ok  ---- 07/07/2009 12:47 PM, Verdell Face wrote: Thsi pt, Stacy [name] wants to switch PCP's from Dr Jonny Ruiz to Dr Felicity Coyer, she wants a female, is this okay?  Elnita Maxwell ------------------------------

## 2010-09-06 NOTE — Consult Note (Signed)
Summary: [facility]  [facility]   Imported By: Sherian Rein 03/25/2010 15:06:22  _____________________________________________________________________  External Attachment:    Type:   Image     Comment:   External Document

## 2010-09-06 NOTE — Assessment & Plan Note (Signed)
Summary: 1 YR F/U MEDTRONIC  Medications Added SYNTHROID 88 MCG  TABS (LEVOTHYROXINE SODIUM) Take 1 tablet by mouth on even days PRADAXA 150 MG CAPS (DABIGATRAN ETEXILATE MESYLATE) take one tab by mouth two times a day      Allergies Added:   Primary Provider:  Newt Lukes MD  CC:  1 year follow up.  Pt has questions about Pradaxa.  History of Present Illness: Stacy [name] is seen in followup for  bradycardia and  intermittent complete heart block.  She is status post pacemaker implantation.  She has a history of coronary disease and has had problems with recurrent chest pain. We undertook Myoview scan in the fall. She then underwent catheterization demonstrating no obstruction; her previously implanted stent was patent her ejection fraction was normal  She's had no significant chest pain. There's been no change in her breathing status. There is no peripheral edema   She comes in asking about Pradaxa; her sister is on it.  Current Medications (verified): 1)  Fluoxetine Hcl 20 Mg Caps (Fluoxetine Hcl) .Marland Kitchen.. 1 Tablet By Mouth Once Daily 2)  Furosemide 40 Mg Tabs (Furosemide) .... Take 1 Tablet By Mouth 3)  Synthroid 88 Mcg  Tabs (Levothyroxine Sodium) .... Take 1 Tablet By Mouth On Even Days 4)  Lithium Carbonate 300 Mg Caps (Lithium Carbonate) .... Take 1 Tablet Two Times A Day 5)  Prilosec 40 Mg Cpdr (Omeprazole) .... Take 1 Daily 6)  Lipitor 80 Mg  Tabs (Atorvastatin Calcium) .Marland Kitchen.. 1 Tablet By Mouth Once Daily 7)  Coumadin 5 Mg Tabs (Warfarin Sodium) .... Take As Directed By Coumadin Clinic. 8)  Aspirin 81 Mg Tabs (Aspirin) .... Once Daily 9)  Centrum Silver  Tabs (Multiple Vitamins-Minerals) .... Once Daily 10)  Fe-Caps 250 Mg Cr-Caps (Ferrous Sulfate) .... Once Daily 11)  Synthroid 100 Mcg Tabs (Levothyroxine Sodium) .... Take 1 By Mouth On Odd Days 12)  Avapro 150 Mg Tabs (Irbesartan) .... Take One-Half  Tablet By Mouth Two Times A Day 13)  Calcium 500 Mg Tabs (Calcium)  .... Take 1 Two Times A Day  Allergies (verified): 1)  ! Pcn 2)  ! Flagyl (Metronidazole) 3)  Codeine  Past History:  Past Medical History: Last updated: 11/11/2009 chronic bipolar disorder Coronary artery disease s/p PTCA complete AVB s/p PPM--Medtronic Adapta ADDr01 hypertension hyperlipidemia hypothyroid Penicillin and codeine allergies History of takotsubo GERD Diverticulosis Tobacco abuse and COPD  MD rooster: endo - brennan cards -Graciela Husbands psyc - [facility] - [facility] - [facility]   Past Surgical History: Last updated: 11/11/2009 Hysterectomy Pacemaker--Medtronic Adapta ADDr01 Right inferior parathyroidectomy  Family History: Last updated: 11/03/2008 Family History of Heart Disease: mother No FH of Colon Cancer:  Social History: Last updated: 08/11/2009 Widowed, 2 girls lives with daughter (who is Engineer, civil (consulting) at [employer]) Patient currently smokes.  Alcohol Use - no Daily Caffeine Use Illicit Drug Use - no  Vital Signs:  Patient profile:   70 year old female Height:      67 inches Weight:      151 pounds BMI:     23.74 Pulse rate:   67 / minute Pulse rhythm:   regular BP sitting:   152 / 80  (left arm) Cuff size:   regular  Vitals Entered By: Judithe Modest CMA (November 11, 2009 1:47 PM)  Physical Exam  General:  The patient was alert and oriented in no acute distress. HEENT Normal.  Neck veins were flat, carotids were brisk.  Lungs were clear.  Heart sounds were regular without murmurs or gallops.  Abdomen was soft with active bowel sounds. There is no clubbing cyanosis or edema. Skin Warm and dry    PPM Specifications Following MD:  Sherryl Manges, MD     PPM Vendor:  Medtronic     PPM Model Number:  [id number]     PPM Serial Number:  [id number] PPM DOI:  07/05/2006     PPM Implanting MD:  Sherryl Manges, MD  Lead 1    Location: RA     DOI: 12/26/1999     Model #: [id number]     Serial #: [id number]     Status: active Lead 2    Location: RV      DOI: 12/26/1999     Model #: [id number]     Serial #: [id number]     Status: active  Magnet Response Rate:  BOL 85 ERI  65  Indications:  CHB  Explantation Comments:  [facility]  PPM Follow Up Remote Check?  No Battery Voltage:  2.77 V     Battery Est. Longevity:  7 years     Pacer Dependent:  Yes       PPM Device Measurements Atrium  Amplitude: 5.6 mV, Impedance: 570 ohms, Threshold: 0.625 V at 0.4 msec Right Ventricle  Impedance: 1298 ohms, Threshold: 1.0 V at 0.4 msec  Episodes MS Episodes:  61     Percent Mode Switch:  0.2%     Coumadin:  Yes Ventricular High Rate:  2     Atrial Pacing:  38.8%     Ventricular Pacing:  99.3%  Parameters Mode:  DDDR     Lower Rate Limit:  60     Upper Rate Limit:  130 Paced AV Delay:  200     Sensed AV Delay:  200 Next Remote Date:  02/10/2010     Next Cardiology Appt Due:  11/06/2010 Tech Comments:  No parameter changes.  2 VHR episodes the longest 15 seconds.  [facility] transmissions every 3 months.  ROV 1 year with Dr. Graciela Husbands. Altha Harm, LPN  November 12, 1322 2:15 PM   Impression & Recommendations:  Problem # 1:  ATRIOVENTRICULAR BLOCK, COMPLETE (ICD-426.0) stable post pacemaker insertion The following medications were removed from the medication list:    Aspirin 81 Mg Tabs (Aspirin) ..... Once daily  Problem # 2:  CORONARY ARTERY DISEASE, S/P PTCA (ICD-414.9) We will. anticipate discontinuing her aspirin. The following medications were removed from the medication list:    Aspirin 81 Mg Tabs (Aspirin) ..... Once daily  Problem # 3:  PACEMAKER, MDT DDD-MEDTRONIC ADAPTA ADDR01 (ICD-V45.01) Device parameters and data were reviewed and no changes were made  Problem # 4:  ATRIAL FIBRILLATION (ICD-427.31) Will discontinue asa.  We had a lengthy discussion regarding the relative merits of Coumadin versus Pradaxa. These included a relative benefits as well as risks. The patient would like to begin on Pradaxa. This discussion took greater than 10  minutes  The following medications were removed from the medication list:    Aspirin 81 Mg Tabs (Aspirin) ..... Once daily  Patient Instructions: 1)  Your physician recommends that you return for lab work in: 7-10 days- bmet/cbc (427.31;414.8) 2)  Your physician has recommended you make the following change in your medication: 1) STOP aspirin, 2) STOP coumadin, 3) START Pradaxa 150mg  two times a day. You will need to be off coumadin for 3 days prior to starting your Pradaxa. 3)  Your physician wants you to follow-up in: 6 months.   You will receive a reminder letter in the mail two months in advance. If you don't receive a letter, please call our office to schedule the follow-up appointment. Prescriptions: PRADAXA 150 MG CAPS (DABIGATRAN ETEXILATE MESYLATE) take one tab by mouth two times a day  #60 x 6   Entered by:   Sherri Rad, RN, BSN   Authorized by:   Nathen May, MD, [facility]   Signed by:   Sherri Rad, RN, BSN on 11/11/2009   Method used:   Electronically to        [facility] [phone]* (retail)       [address]       [place], [place]  [address]       Ph: [phone]       Fax: [phone]   RxID:   [id number]

## 2010-09-06 NOTE — Cardiovascular Report (Signed)
Summary: Office Visit Remote   Office Visit Remote   Imported By: Roderic Ovens 10/01/2009 13:52:06  _____________________________________________________________________  External Attachment:    Type:   Image     Comment:   External Document

## 2010-09-06 NOTE — Letter (Signed)
Summary: Remote Device Check  [facility], Main Office  [address]   [place], [place] [address]   Phone: [phone]  Fax: [phone]     March 08, 2010 MRN: [id number]   Aylee Maranto [address] [place], [place]  [address]   Dear Ms. Lince,   Your remote transmission was recieved and reviewed by your physician.  All diagnostics were within normal limits for you.  __X___Your next transmission is scheduled for: 05-26-2010.  Please transmit at any time this day.  If you have a wireless device your transmission will be sent automatically.   Sincerely,  Vella Kohler

## 2010-09-06 NOTE — Medication Information (Signed)
Summary: Coumadin Clinic  Anticoagulant Therapy  Managed by: Inactive Referring MD: Sherryl Manges MD PCP: Newt Lukes MD Supervising MD: Johney Frame MD, Fayrene Fearing Indication 1: CVA-stroke (ICD-436) Indication 2: TIA (ICD-435.9) Lab Used: [facility] Site: [facility] INR RANGE 2 - 3          Comments: Pt started on Pradaxa 3 days ago, off coumadin. Cloyde Reams RN  November 17, 2009 10:22 AM   Allergies: 1)  ! Pcn 2)  ! Flagyl (Metronidazole) 3)  Codeine  Anticoagulation Management History:      Positive risk factors for bleeding include an age of 70 years or older.  The bleeding index is 'intermediate risk'.  Positive CHADS2 values include History of HTN.  Negative CHADS2 values include Age > 11 years old.  The start date was 04/11/2002.  Anticoagulation responsible provider: Jaylea Plourde MD, Fayrene Fearing.  Exp: 12/2010.    Anticoagulation Management Assessment/Plan:      The target INR is 2 - 3.  The next INR is due 11/18/2009.  Anticoagulation instructions were given to patient.  Results were reviewed/authorized by Inactive.         Prior Anticoagulation Instructions: INR 2.0  Take an extra 1/2 tablet today then resume same dosage 1 tablet daily except 1/2 tablet on Mondays and Wednesdays.  Recheck in 4 weeks.

## 2010-09-06 NOTE — Progress Notes (Signed)
Summary: med refills  Phone Note Refill Request Message from:  Fax from Pharmacy on May 26, 2010 8:40 AM  Refills Requested: Medication #1:  LITHIUM CARBONATE 300 MG CAPS Take 1 tablet two times a day  Medication #2:  FLUOXETINE HCL 20 MG CAPS 1 tablet by mouth once daily  Medication #3:  SYNTHROID 88 MCG  TABS Take 1 tablet by mouth on even days  Medication #4:  AVAPRO 150 MG TABS Take one-half  tablet by mouth two times a day [facility]  Initial call taken by: Orlan Leavens RMA,  May 26, 2010 8:41 AM  Follow-up for Phone Call        MD sign paper request fax backto [facility]. updated EMR Follow-up by: Orlan Leavens RMA,  May 26, 2010 8:45 AM    Prescriptions: SYNTHROID 100 MCG TABS (LEVOTHYROXINE SODIUM) Take 1 by mouth on odd days  #90 x 3   Entered by:   Orlan Leavens RMA   Authorized by:   Newt Lukes MD   Signed by:   Orlan Leavens RMA on 05/26/2010   Method used:   Historical   RxID:   [id number] PRILOSEC 40 MG CPDR (OMEPRAZOLE) Take 1 daily  #90 x 3   Entered by:   Orlan Leavens RMA   Authorized by:   Newt Lukes MD   Signed by:   Orlan Leavens RMA on 05/26/2010   Method used:   Historical   RxID:   [id number] LIPITOR 80 MG  TABS (ATORVASTATIN CALCIUM) 1 tablet by mouth once daily  #90 x 3   Entered by:   Orlan Leavens RMA   Authorized by:   Newt Lukes MD   Signed by:   Orlan Leavens RMA on 05/26/2010   Method used:   Historical   RxID:   [id number] AVAPRO 150 MG TABS (IRBESARTAN) Take one-half  tablet by mouth two times a day  #90 x 3   Entered by:   Orlan Leavens RMA   Authorized by:   Newt Lukes MD   Signed by:   Orlan Leavens RMA on 05/26/2010   Method used:   Historical   RxID:   [id number] SYNTHROID 88 MCG  TABS (LEVOTHYROXINE SODIUM) Take 1 tablet by mouth on even days  #90 x 3   Entered by:   Orlan Leavens RMA   Authorized by:   Newt Lukes MD   Signed by:   Orlan Leavens RMA on 05/26/2010   Method used:    Historical   RxID:   [id number] FLUOXETINE HCL 20 MG CAPS (FLUOXETINE HCL) 1 tablet by mouth once daily  #90 x 3   Entered by:   Orlan Leavens RMA   Authorized by:   Newt Lukes MD   Signed by:   Orlan Leavens RMA on 05/26/2010   Method used:   Historical   RxID:   [id number] LITHIUM CARBONATE 300 MG CAPS (LITHIUM CARBONATE) Take 1 tablet two times a day  #180 x 3   Entered by:   Orlan Leavens RMA   Authorized by:   Newt Lukes MD   Signed by:   Orlan Leavens RMA on 05/26/2010   Method used:   Historical   RxID:   [id number]

## 2010-09-06 NOTE — Cardiovascular Report (Signed)
Summary: Office Visit Remote   Office Visit Remote   Imported By: Roderic Ovens 06/09/2010 16:18:31  _____________________________________________________________________  External Attachment:    Type:   Image     Comment:   External Document

## 2010-09-06 NOTE — Miscellaneous (Signed)
Summary: Flu Vaccine / [facility]  Flu Vaccine / [facility]   Imported By: Lennie Odor 05/31/2010 10:19:23  _____________________________________________________________________  External Attachment:    Type:   Image     Comment:   External Document

## 2010-09-06 NOTE — Letter (Signed)
Summary: Remote Device Check  [facility], Main Office  [address]   [place], [place] [address]   Phone: [phone]  Fax: [phone]     October 01, 2009 MRN: [id number]   Stacy [name] [address] [place], [place]  [address]   Dear [name],   Your remote transmission was recieved and reviewed by your physician.  All diagnostics were within normal limits for you.    ___X___Your next office visit is scheduled for:  November 11, 2009 AT 1:45PM WITH DR Graciela Husbands.     Sincerely,  Proofreader

## 2010-09-06 NOTE — Progress Notes (Signed)
Summary: pradaxa   Phone Note Call from Patient Call back at Home Phone [phone] Call back at [phone]   Caller: Patient Reason for Call: Talk to Nurse Summary of Call: has questions about pradaxa and if she still needs to go to coumadin clinic Initial call taken by: Migdalia Dk,  November 17, 2009 10:00 AM  Follow-up for Phone Call        Spoke with pt. Patient started Pradaxa 150 mg three days ago. Pt has an appointment in the [facility]in Clinic on tomorrow April 14th @ 9:30 AM.. Pt would like to know if she needs tokep the  appointment. I let pt. know she does not needs to continue checking her INR. CCR aware and canceled tomorrow's appointment. Follow-up by: Ollen Gross, RN, BSN,  November 17, 2009 10:22 AM

## 2010-09-06 NOTE — Medication Information (Signed)
Summary: rov/tm  Anticoagulant Therapy  Managed by: Cloyde Reams, RN, BSN Referring MD: Sherryl Manges MD PCP: Lesle Chris, MD Supervising MD: Shirlee Latch MD, Itzabella Sorrels Indication 1: CVA-stroke (ICD-436) Indication 2: TIA (ICD-435.9) Lab Used: [facility] Site: [facility] INR POC 2.0 INR RANGE 2 - 3  Dietary changes: no    Health status changes: no    Bleeding/hemorrhagic complications: no    Recent/future hospitalizations: no    Any changes in medication regimen? no    Recent/future dental: no  Any missed doses?: no       Is patient compliant with meds? yes       Allergies (verified): 1)  ! Pcn 2)  ! Flagyl (Metronidazole) 3)  Codeine  Anticoagulation Management History:      The patient is taking warfarin and comes in today for a routine follow up visit.  Positive risk factors for bleeding include an age of 70 years or older.  The bleeding index is 'intermediate risk'.  Positive CHADS2 values include History of HTN.  Negative CHADS2 values include Age > 70 years old.  The start date was 04/11/2002.  Anticoagulation responsible provider: Shirlee Latch MD, Chisom Muntean.  INR POC: 2.0.  Cuvette Lot#: [id number].  Exp: 12/2010.    Anticoagulation Management Assessment/Plan:      The patient's current anticoagulation dose is Coumadin 5 mg tabs: Take as directed by coumadin clinic..  The target INR is 2 - 3.  The next INR is due 11/18/2009.  Anticoagulation instructions were given to patient.  Results were reviewed/authorized by Cloyde Reams, RN, BSN.  She was notified by Cloyde Reams RN.         Prior Anticoagulation Instructions: INR 2.3 Continue 5mg s everyday except 2.5mg s on Mondays and Wednesdays. Recheck in 4 weeks.   Current Anticoagulation Instructions: INR 2.0  Take an extra 1/2 tablet today then resume same dosage 1 tablet daily except 1/2 tablet on Mondays and Wednesdays.  Recheck in 4 weeks.   Prescriptions: COUMADIN 5 MG TABS (WARFARIN SODIUM) Take as directed by coumadin  clinic.  #35 x 3   Entered by:   Cloyde Reams RN   Authorized by:   Nathen May, MD, [facility]   Signed by:   Cloyde Reams RN on 10/21/2009   Method used:   Electronically to        [facility] [address] [phone]* (retail)       [address]       [place], [place]  [address]       Ph: [phone]       Fax: [phone]   RxID:   [id number]

## 2010-09-06 NOTE — Progress Notes (Signed)
Summary: pt needs Rx filled    Phone Note Refill Request Call back at [phone] Message from:  [facility] on [facility] fax# [phone]  Refills Requested: Medication #1:  PRADAXA 150 MG CAPS take one tab by mouth two times a day pt changed Pharm and didn't get one of her meds and needs it called in asap  Initial call taken by: Omer Jack,  June 02, 2010 10:25 AM    Prescriptions: PRADAXA 150 MG CAPS (DABIGATRAN ETEXILATE MESYLATE) take one tab by mouth two times a day  #180 x 3   Entered by:   Judithe Modest CMA   Authorized by:   Nathen May, MD, [facility]   Signed by:   Judithe Modest CMA on 06/02/2010   Method used:   Faxed to ...       [facility] (mail[facility]order)             , [place]         Ph:        Fax: [phone]   RxID:   [id number]

## 2010-09-06 NOTE — Letter (Signed)
Summary: Request for power mobility/[facility] [employer]  Request for power mobility/[employer]   Imported By: Sherian Rein 03/10/2010 11:58:05  _____________________________________________________________________  External Attachment:    Type:   Image     Comment:   External Document

## 2010-09-06 NOTE — Cardiovascular Report (Signed)
Summary: Office Visit Remote   Office Visit Remote   Imported By: Roderic Ovens 03/11/2010 10:41:34  _____________________________________________________________________  External Attachment:    Type:   Image     Comment:   External Document

## 2010-09-06 NOTE — Medication Information (Signed)
Summary: rov/ez  Anticoagulant Therapy  Managed by: Bethena Midget, RN, BSN Referring MD: Sherryl Manges MD PCP: Lesle Chris, MD Supervising MD: Myrtis Ser MD, Tinnie Gens Indication 1: CVA-stroke (ICD-436) Indication 2: TIA (ICD-435.9) Lab Used: [facility] Site: [facility] INR POC 2.3 INR RANGE 2 - 3  Dietary changes: no    Health status changes: no    Bleeding/hemorrhagic complications: no    Recent/future hospitalizations: no    Any changes in medication regimen? no    Recent/future dental: no  Any missed doses?: no       Is patient compliant with meds? yes       Allergies: 1)  ! Pcn 2)  ! Flagyl (Metronidazole) 3)  Codeine  Anticoagulation Management History:      The patient is taking warfarin and comes in today for a routine follow up visit.  Positive risk factors for bleeding include an age of 70 years or older.  The bleeding index is 'intermediate risk'.  Positive CHADS2 values include History of HTN.  Negative CHADS2 values include Age > 57 years old.  The start date was 04/11/2002.  Anticoagulation responsible provider: Myrtis Ser MD, Tinnie Gens.  INR POC: 2.3.  Cuvette Lot#: [id number].  Exp: 11/2010.    Anticoagulation Management Assessment/Plan:      The patient's current anticoagulation dose is Coumadin 5 mg tabs: Take as directed by coumadin clinic..  The target INR is 2 - 3.  The next INR is due 10/21/2009.  Anticoagulation instructions were given to patient.  Results were reviewed/authorized by Bethena Midget, RN, BSN.  She was notified by Bethena Midget, RN, BSN.         Prior Anticoagulation Instructions: INR: 2.0 Change dose to 1 tablet daily except 1/2 tablet on Mondays and Fridays Recheck in 3 weeks  Current Anticoagulation Instructions: INR 2.3 Continue 5mg s everyday except 2.5mg s on Mondays and Wednesdays. Recheck in 4 weeks.

## 2010-09-06 NOTE — Cardiovascular Report (Signed)
Summary: Office Visit   Office Visit   Imported By: Roderic Ovens 11/16/2009 16:39:49  _____________________________________________________________________  External Attachment:    Type:   Image     Comment:   External Document

## 2010-09-06 NOTE — Progress Notes (Signed)
Summary: Exercise?  Phone Note Call from Patient Call back at Home Phone [phone]   Caller: Patient Summary of Call: Pt called requesting MD's recommendation on starting an exercise program at [facility] with Dx of Arthritis? Okay to exercise? Initial call taken by: Margaret Pyle, CMA,  January 19, 2010 11:36 AM  Follow-up for Phone Call        yes, medically stable to participate in program at gym Follow-up by: Newt Lukes MD,  January 19, 2010 12:56 PM  Additional Follow-up for Phone Call Additional follow up Details #1::        pt informed Additional Follow-up by: Margaret Pyle, CMA,  January 19, 2010 3:07 PM

## 2010-09-06 NOTE — Medication Information (Signed)
Summary: [facility]   Imported By: Lester [place] 05/31/2010 08:38:04  _____________________________________________________________________  External Attachment:    Type:   Image     Comment:   External Document

## 2010-09-06 NOTE — Medication Information (Signed)
Summary: rov/tm  Anticoagulant Therapy  Managed by: Bethena Midget, RN, BSN Referring MD: Sherryl Manges MD PCP: Lesle Chris, MD Supervising MD: Jens Som MD, Arlys John Indication 1: CVA-stroke (ICD-436) Indication 2: TIA (ICD-435.9) Lab Used: [facility] Site: [facility] INR POC 2.0 INR RANGE 2 - 3  Dietary changes: no    Health status changes: no    Bleeding/hemorrhagic complications: no    Recent/future hospitalizations: no    Any changes in medication regimen? no    Recent/future dental: no  Any missed doses?: no       Is patient compliant with meds? yes       Allergies: 1)  ! Pcn 2)  ! Flagyl (Metronidazole) 3)  Codeine  Anticoagulation Management History:      The patient is taking warfarin and comes in today for a routine follow up visit.  Positive risk factors for bleeding include an age of 70 years or older.  The bleeding index is 'intermediate risk'.  Positive CHADS2 values include History of HTN.  Negative CHADS2 values include Age > 50 years old.  The start date was 04/11/2002.  Anticoagulation responsible provider: Jens Som MD, Arlys John.  INR POC: 2.0.  Cuvette Lot#: [id number].  Exp: 11/2010.    Anticoagulation Management Assessment/Plan:      The patient's current anticoagulation dose is Coumadin 5 mg tabs: Take as directed by coumadin clinic..  The target INR is 2 - 3.  The next INR is due 09/23/2009.  Anticoagulation instructions were given to patient.  Results were reviewed/authorized by Bethena Midget, RN, BSN.  She was notified by Ysidro Evert, Pharm D Candidate.         Prior Anticoagulation Instructions: INR 2.2 Continue 5mg s daily except 2.5mg s on Mondays, Wednesdays, and Fridays. Recheck in 4 weeks.   Current Anticoagulation Instructions: INR: 2.0 Change dose to 1 tablet daily except 1/2 tablet on Mondays and Fridays Recheck in 3 weeks

## 2010-09-06 NOTE — Assessment & Plan Note (Signed)
Summary: SHAKING/HANDS MAINLY --REFUSED ANOTHER MD---STC   Vital Signs:  Patient profile:   70 year old female Height:      67 inches (170.18 cm) Weight:      150.4 pounds (68.36 kg) O2 Sat:      94 % on Room air Temp:     98.0 degrees F (36.67 degrees C) oral Pulse rate:   90 / minute BP sitting:   112 / 72  (left arm) Cuff size:   regular  Vitals Entered By: Orlan Leavens (January 17, 2010 2:50 PM)  O2 Flow:  Room air CC: shaking hands, also pt is concern about knot on (R) side back, denies pain Is Patient Diabetic? No Pain Assessment Patient in pain? no        Primary Care Provider:  Newt Lukes MD  CC:  shaking hands, also pt is concern about knot on (R) side back, and denies pain.  History of Present Illness: here for followup -3 month  1) hypothyroidism - follows with endo - no recent dose changes in thyroid replacment - no skin or weight changes  2)bipolar dz with depression - Li levels monitored periodically but not in past 6 months - no dose adjustments or changes symptoms well controlled - follows with psyc several times each year and as needed - does note long standing shaking of hands and arms, most notable in AM but not every AM - ?getting worse  3) dyslipidemia -  reports compliance with ongoing medical treatment and no changes in medication dose or frequency. denies adverse side effects related to current therapy.   4) CAD issues - reviewed hx - reports compliance with ongoing medical treatment and no changes in medication dose or frequency. denies adverse side effects related to current therapy.   5) c/o left flank knot - noticed 2 weeks ago - no injury or truama - not painful or discolored - not changing in size  Current Medications (verified): 1)  Fluoxetine Hcl 20 Mg Caps (Fluoxetine Hcl) .Marland Kitchen.. 1 Tablet By Mouth Once Daily 2)  Furosemide 40 Mg Tabs (Furosemide) .... Take 1 Tablet By Mouth 3)  Synthroid 88 Mcg  Tabs (Levothyroxine Sodium) ....  Take 1 Tablet By Mouth On Even Days 4)  Lithium Carbonate 300 Mg Caps (Lithium Carbonate) .... Take 1 Tablet Two Times A Day 5)  Prilosec 40 Mg Cpdr (Omeprazole) .... Take 1 Daily 6)  Lipitor 80 Mg  Tabs (Atorvastatin Calcium) .Marland Kitchen.. 1 Tablet By Mouth Once Daily 7)  Pradaxa 150 Mg Caps (Dabigatran Etexilate Mesylate) .... Take One Tab By Mouth Two Times A Day 8)  Centrum Silver  Tabs (Multiple Vitamins-Minerals) .... Once Daily 9)  Fe-Caps 250 Mg Cr-Caps (Ferrous Sulfate) .... Once Daily 10)  Synthroid 100 Mcg Tabs (Levothyroxine Sodium) .... Take 1 By Mouth On Odd Days 11)  Avapro 150 Mg Tabs (Irbesartan) .... Take One-Half  Tablet By Mouth Two Times A Day 12)  Calcium 500 Mg Tabs (Calcium) .... Take 1 Two Times A Day  Allergies (verified): 1)  ! Pcn 2)  ! Flagyl (Metronidazole) 3)  Codeine  Past History:  Past Medical History: chronic bipolar disorder Coronary artery disease s/p PTCA complete AVB s/p PPM--[employer]pta [id number] hypertension hyperlipidemia hypothyroid Penicillin and codeine allergies History of takotsubo GERD Diverticulosis Tobacco abuse and COPD  MD roster: endo - brennan cards -Graciela Husbands psyc - sara roberts[facility] - [facility] - kramer   Review of Systems  The patient denies anorexia, chest pain,  syncope, and abdominal pain.    Physical Exam  General:  alert, well-developed, well-nourished, and cooperative to examination.    Lungs:  normal respiratory effort, no intercostal retractions or use of accessory muscles; normal breath sounds bilaterally - no crackles and no wheezes.    Heart:  normal rate, regular rhythm, no murmur, and no rub. BLE without edema. normal DP pulses and normal cap refill in all 4 extremities    Neurologic:  alert & oriented X3 and cranial nerves II-XII symetrically intact.  strength normal in all extremities, sensation intact to light touch, and gait normal. speech fluent without dysarthria or aphasia; follows commands with good  comprehension. no resting or intention tremor noted Skin:  soft lipoma approx 2 cm diameter over left flank - no rashes, vesicles, ulcers, or erythema. No other nodules or irregularity to palpation.  Psych:  Oriented X3, memory intact for recent and remote, normally interactive, good eye contact, not anxious appearing, not depressed appearing, and not agitated.      Impression & Recommendations:  Problem # 1:  TREMOR (ICD-781.0) none on exam - reports endo keeping thyroid under good control but will check tsh now- also check Li level now as this most likely to be contrib to her symptoms  consider neuro eval if thyroid and Dierdre Searles leveles are normal Orders: T-Lithium Level (40102-72536) TLB-TSH (Thyroid Stimulating Hormone) (84443-TSH)  Problem # 2:  LIPOMA (ICD-214.9) dx education and reassurance provided - no need for other eval at this time  Problem # 3:  HYPOTHYROIDISM (ICD-244.9)  Her updated medication list for this problem includes:    Synthroid 88 Mcg Tabs (Levothyroxine sodium) .Marland Kitchen... Take 1 tablet by mouth on even days    Synthroid 100 Mcg Tabs (Levothyroxine sodium) .Marland Kitchen... Take 1 by mouth on odd days  Orders: TLB-TSH (Thyroid Stimulating Hormone) (84443-TSH)  managed by endo - will contact him if asst needed based on these labs and tremor symptoms   Problem # 4:  ATRIAL FIBRILLATION (ICD-427.31)   The patient is now on Pradaxa.  Reviewed the following: PT: 18.7 (02/22/2009)    Coumadin Dose (weekly): 30 mg (11/17/2009) Prior Coumadin Dose (weekly): 30 mg (11/17/2009) Next Protime: 11/18/2009 (dated on 10/21/2009)  Problem # 5:  HYPERTENSION (ICD-401.9)  Her updated medication list for this problem includes:    Furosemide 40 Mg Tabs (Furosemide) .Marland Kitchen... Take 1 tablet by mouth    Avapro 150 Mg Tabs (Irbesartan) .Marland Kitchen... Take one-half  tablet by mouth two times a day  BP today: 112/72 Prior BP: 152/80 (11/11/2009)  Labs Reviewed: K+: 3.7 (11/22/2009) Creat: : 1.1  (11/22/2009)   Chol: 158 (08/11/2009)   HDL: 50.80 (08/11/2009)   LDL: DEL (05/25/2006)   TG: 208.0 (08/11/2009)  Problem # 6:  DYSLIPIDEMIA (ICD-272.4)  Her updated medication list for this problem includes:    Lipitor 80 Mg Tabs (Atorvastatin calcium) .Marland Kitchen... 1 tablet by mouth once daily  Labs Reviewed: SGOT: 20 (08/11/2009)   SGPT: 22 (08/11/2009)   HDL:50.80 (08/11/2009), 43.4 (05/25/2006)  LDL:DEL (05/25/2006)  Chol:158 (08/11/2009), 270 (05/25/2006)  Trig:208.0 (08/11/2009), 414 (05/25/2006)  Complete Medication List: 1)  Fluoxetine Hcl 20 Mg Caps (Fluoxetine hcl) .Marland Kitchen.. 1 tablet by mouth once daily 2)  Furosemide 40 Mg Tabs (Furosemide) .... Take 1 tablet by mouth 3)  Synthroid 88 Mcg Tabs (Levothyroxine sodium) .... Take 1 tablet by mouth on even days 4)  Lithium Carbonate 300 Mg Caps (Lithium carbonate) .... Take 1 tablet two times a day 5)  Prilosec 40  Mg Cpdr (Omeprazole) .... Take 1 daily 6)  Lipitor 80 Mg Tabs (Atorvastatin calcium) .Marland Kitchen.. 1 tablet by mouth once daily 7)  Pradaxa 150 Mg Caps (Dabigatran etexilate mesylate) .... Take one tab by mouth two times a day 8)  Centrum Silver Tabs (Multiple vitamins-minerals) .... Once daily 9)  Fe-caps 250 Mg Cr-caps (Ferrous sulfate) .... Once daily 10)  Synthroid 100 Mcg Tabs (Levothyroxine sodium) .... Take 1 by mouth on odd days 11)  Avapro 150 Mg Tabs (Irbesartan) .... Take one-half  tablet by mouth two times a day 12)  Calcium 500 Mg Tabs (Calcium) .... Take 1 two times a day  Patient Instructions: 1)  it was good to see you today. 2)  i suspect lithium may be what is causing your tremors - as long as the lithium levels are in good range, the occasssional tremor is nothing to worry about - will check labs today for this 3)  test(s) ordered today - your results will be posted on the phone tree for review in 48-72 hours from the time of test completion; call [phone] and enter your 9 digit MRN (listed above on this page, just below  your name); if any changes need to be made or there are abnormal results, you will be contacted directly.  4)  no medication changes recommeneded at this time 5)  Please schedule a follow-up appointment in 4-6 months, sooner if problems.

## 2010-09-06 NOTE — Assessment & Plan Note (Signed)
Summary: 3-6 MTH FU--STC   Vital Signs:  Patient profile:   70 year old female Height:      67 inches (170.18 cm) Weight:      151.4 pounds (68.82 kg) O2 Sat:      94 % on Room air Temp:     98.3 degrees F (36.83 degrees C) oral Pulse rate:   83 / minute BP sitting:   124 / 78  (left arm) Cuff size:   regular  Vitals Entered By: Stacy [name] (November 08, 2009 9:53 AM)  O2 Flow:  Room air CC: 3 month follow-up. Pt is concern about why she  shakes in am, also need refill on prilosec Is Patient Diabetic? No Pain Assessment Patient in pain? no        Primary Care Provider:  Newt Lukes MD  CC:  3 month follow-up. Pt is concern about why she  shakes in am and also need refill on prilosec.  History of Present Illness: here for followup -3 month  1) hypothyroidism - follows with Stacy - no recent dose changes in thyroid replacment - no skin or weight changes  2)bipolar dz with depression - Li levels monitored periodically - no dose adjustments - symptoms well controlled - follows with psyc several times each year and as needed - does note long standing shaking of hands and arms, most notable in AM but not every AM -  3) dyslipidemia -  reports compliance with ongoing medical treatment and no changes in medication dose or frequency. denies adverse side effects related to current therapy.   4) CAD issues - reviewed hx - reports compliance with ongoing medical treatment and no changes in medication dose or frequency. denies adverse side effects related to current therapy.   5) c/o left knee and outer hip pain pain onset approx 1 mo ago pain precipitated by twisting motion when stepped wrong off step pain worst with lying on left side at night no radiation of pain up or down leg or into buttock denies assoc swelling or brusing no other joints with pain  Clinical Review Panels:  Immunizations   Last Tetanus Booster:  Historical (08/07/2004)   Last Flu Vaccine:  Fluvax  3+ (08/11/2009)   Last Pneumovax:  Historical (01/05/2006)  Lipid Management   Cholesterol:  158 (08/11/2009)   LDL (bad choesterol):  DEL (05/25/2006)   HDL (good cholesterol):  50.80 (08/11/2009)   Triglycerides:  414 (05/25/2006)  CBC   WBC:  8.3 (03/26/2008)   RBC:  4.22 (03/26/2008)   Hgb:  13.5 (03/26/2008)   Hct:  38.2 (03/26/2008)   Platelets:  228 (03/26/2008)   MCV  90.7 (03/26/2008)   MCHC  35.4 (03/26/2008)   RDW  13.1 (03/26/2008)   PMN:  54.7 (03/26/2008)   Lymphs:  35.4 (03/26/2008)   Monos:  6.3 (03/26/2008)   Eosinophils:  2.1 (03/26/2008)   Basophil:  1.5 (03/26/2008)  Complete Metabolic Panel   BUN:  14 (04/09/2008)   Creatinine:  0.8 (04/09/2008)   Albumin:  3.8 (08/11/2009)   Total Protein:  7.1 (08/11/2009)   Total Bili:  0.7 (08/11/2009)   Alk Phos:  69 (08/11/2009)   SGPT (ALT):  22 (08/11/2009)   SGOT (AST):  20 (08/11/2009)   Current Medications (verified): 1)  Fluoxetine Hcl 20 Mg Caps (Fluoxetine Hcl) .Marland Kitchen.. 1 Tablet By Mouth Once Daily 2)  Furosemide 40 Mg Tabs (Furosemide) .... Take 1 Tablet By Mouth 3)  Synthroid 88 Mcg  Tabs (  Levothyroxine Sodium) .... Take 1 Tablet By Mouth On Even Days 4)  Lithium Carbonate 300 Mg Caps (Lithium Carbonate) .... Take 1 Tablet Two Times A Day 5)  Prilosec 40 Mg Cpdr (Omeprazole) .... Take 1 Daily 6)  Lipitor 80 Mg  Tabs (Atorvastatin Calcium) .Marland Kitchen.. 1 Tablet By Mouth Once Daily 7)  Coumadin 5 Mg Tabs (Warfarin Sodium) .... Take As Directed By Coumadin Clinic. 8)  Aspirin 81 Mg Tabs (Aspirin) .... Once Daily 9)  Centrum Silver  Tabs (Multiple Vitamins-Minerals) .... Once Daily 10)  Fe-Caps 250 Mg Cr-Caps (Ferrous Sulfate) .... Once Daily 11)  Synthroid 100 Mcg Tabs (Levothyroxine Sodium) .... Take 1 By Mouth On Odd Days 12)  Avapro 150 Mg Tabs (Irbesartan) .... Take One-Half  Tablet By Mouth Two Times A Day 13)  Calcium 500 Mg Tabs (Calcium) .... Take 1 Two Times A Day  Allergies (verified): 1)  ! Pcn 2)   ! Flagyl (Metronidazole) 3)  Codeine  Past History:  Past Medical History: chronic bipolar disorder Coronary artery disease s/p PTCA complete AVB s/p PPM hypertension hyperlipidemia hypothyroid Penicillin and codeine allergies History of takotsubo GERD Diverticulosis Tobacco abuse and COPD  MD rooster: Stacy [name] cards -Stacy [name] psyc - Stacy [name] Stacy [name] Stacy [name]   Past Surgical History: Hysterectomy Pacemaker PTCA-Stent Right inferior parathyroidectomy  Review of Systems  The patient denies anorexia, fever, weight loss, chest pain, syncope, peripheral edema, headaches, and abdominal pain.    Physical Exam  General:  alert, well-developed, well-nourished, and cooperative to examination.    Lungs:  normal respiratory effort, no intercostal retractions or use of accessory muscles; normal breath sounds bilaterally - no crackles and no wheezes.    Heart:  normal rate, regular rhythm, no murmur, and no rub. BLE without edema. normal DP pulses and normal cap refill in all 4 extremities    Msk:  left knee: decreased range of motion, diffuse boggy synovitis. Tender to palpation on joint line. Increased pain with weight bearing. Positive crepitus. +palp tenderness over left greater troch bursa but normal FROM at left hip Neurologic:  alert & oriented X3 and cranial nerves II-XII symetrically intact.  strength normal in all extremities, sensation intact to light touch, and gait normal. speech fluent without dysarthria or aphasia; follows commands with good comprehension. no resting or intention tremor noted Psych:  Oriented X3, memory intact for recent and remote, normally interactive, good eye contact, not anxious appearing, not depressed appearing, and not agitated.      Impression & Recommendations:  Problem # 1:  BIPOLAR AFFECTIVE DISORDER (ICD-296.80)  cont Li and other med mgmt as per psyc -  appears very well controlled suspect occ AM tremor related  to Stacy [name] tx - no evidence for other neuro dz  Problem # 2:  HYPERTENSION (ICD-401.9)  Her updated medication list for this problem includes:    Furosemide 40 Mg Tabs (Furosemide) .Marland Kitchen... Take 1 tablet by mouth    Avapro 150 Mg Tabs (Irbesartan) .Marland Kitchen... Take one-half  tablet by mouth two times a day  BP today: 124/78 Prior BP: 124/78 (08/11/2009)  Labs Reviewed: Creat: : 0.8 (04/09/2008)   Chol: 158 (08/11/2009)   HDL: 50.80 (08/11/2009)   LDL: DEL (05/25/2006)   TG: 208.0 (08/11/2009)  Problem # 3:  DYSLIPIDEMIA (ICD-272.4)  Her updated medication list for this problem includes:    Lipitor 80 Mg Tabs (Atorvastatin calcium) .Marland Kitchen... 1 tablet by mouth once daily  Labs Reviewed: SGOT: 20 (08/11/2009)  SGPT: 22 (08/11/2009)   HDL:50.80 (08/11/2009), 43.4 (05/25/2006)  LDL:DEL (05/25/2006)  Chol:158 (08/11/2009), 270 (05/25/2006)  Trig:208.0 (08/11/2009), 414 (05/25/2006)  Problem # 4:  HYPOTHYROIDISM (ICD-244.9)  managed by Stacy -  Her updated medication list for this problem includes:    Synthroid 88 Mcg Tabs (Levothyroxine sodium) .Marland Kitchen... Take 1 tablet by mouth on even days    Synthroid 100 Mcg Tabs (Levothyroxine sodium) .Marland Kitchen... Take 1 by mouth on odd days  Labs Reviewed: TSH: 3.292 (03/04/2009)    Chol: 158 (08/11/2009)   HDL: 50.80 (08/11/2009)   LDL: DEL (05/25/2006)   TG: 208.0 (08/11/2009)  Problem # 5:  TROCHANTERIC BURSITIS, LEFT (ICD-726.5)  refer to ?steroid shot by Stacy/sports med  Orders: Orthopedic Referral (Stacy) Time spent with patient 25 minutes, more than 50% of this time was spent counseling patient on possible side effects of meds, esp Lithium and treatment options for left knee OA and greater troch pain symptoms - pt prefers poss shot  Complete Medication List: 1)  Fluoxetine Hcl 20 Mg Caps (Fluoxetine hcl) .Marland Kitchen.. 1 tablet by mouth once daily 2)  Furosemide 40 Mg Tabs (Furosemide) .... Take 1 tablet by mouth 3)  Synthroid 88 Mcg Tabs (Levothyroxine sodium) .... Take  1 tablet by mouth on even days 4)  Lithium Carbonate 300 Mg Caps (Lithium carbonate) .... Take 1 tablet two times a day 5)  Prilosec 40 Mg Cpdr (Omeprazole) .... Take 1 daily 6)  Lipitor 80 Mg Tabs (Atorvastatin calcium) .Marland Kitchen.. 1 tablet by mouth once daily 7)  Coumadin 5 Mg Tabs (Warfarin sodium) .... Take as directed by coumadin clinic. 8)  Aspirin 81 Mg Tabs (Aspirin) .... Once daily 9)  Centrum Silver Tabs (Multiple vitamins-minerals) .... Once daily 10)  Fe-caps 250 Mg Cr-caps (Ferrous sulfate) .... Once daily 11)  Synthroid 100 Mcg Tabs (Levothyroxine sodium) .... Take 1 by mouth on odd days 12)  Avapro 150 Mg Tabs (Irbesartan) .... Take one-half  tablet by mouth two times a day 13)  Calcium 500 Mg Tabs (Calcium) .... Take 1 two times a day  Patient Instructions: 1)  it was good to see you today. 2)  i suspect lithium may be what is causing your tremors - as long as the lithium levels are in good range, the occasssional tremor is nothing to worry about 3)  we'll make referral to dr. Farris Has (or someone at [facility]) to consider cortisone shots for your left outer hip and knee. Our office will contact you regarding this appointment once made.  4)  left hip rpoblem if called "greater trochanetic bursitis" 5)  Please schedule a follow-up appointment in 4-6 months, sooner if problems.  Prescriptions: PRILOSEC 40 MG CPDR (OMEPRAZOLE) Take 1 daily  #30 x 5   Entered by:   Stacy [name]   Authorized by:   Stacy Lukes MD   Signed by:   Stacy [name] on 11/08/2009   Method used:   Electronically to        [facility] [address] [address] [phone]* (retail)       [address]       [place], [place]  [address]       Ph: [phone]       Fax: [phone]   RxID:   [id number]

## 2010-09-06 NOTE — Assessment & Plan Note (Signed)
Summary: new / medicare / #/ cd   Vital Signs:  Patient profile:   70 year old female Height:      67 inches (170.18 cm) Weight:      153.2 pounds (69.64 kg) O2 Sat:      97 % on Room air Temp:     97.0 degrees F (36.11 degrees C) oral Pulse rate:   85 / minute BP sitting:   124 / 78  (left arm) Cuff size:   regular  Vitals Entered By: Orlan Leavens (August 11, 2009 9:28 AM)  O2 Flow:  Room air CC: New patient Is Patient Diabetic? No Pain Assessment Patient in pain? no        Primary Care Provider:  Lesle Chris, MD  CC:  New patient.  History of Present Illness: new pt to me - prec followed by my partner dr. Jonny Ruiz but not since 12/2006 -  recently, PC from [facility] Cle[facility]  1) hypothyroidism - follows with endo - no recent dose changes in thyroid replacment - no skin or weight changes  2)bipolar dz with depression - Li levels monitored periodically - no dose adjustments - symptoms well controlled - follows with psyc several times each year and as needed   3) dyslipidemia -  reports compliance with ongoing medical treatment and no changes in medication dose or frequency. denies adverse side effects related to current therapy.   4)CAD issues - reviewed hx - reports compliance with ongoing medical treatment and no changes in medication dose or frequency. denies adverse side effects related to current therapy.   5) c/o left elbow pain pain onset approx 1 mo ago pain precipitated by accidental fall onto left elbow and right knee pain worst with extension at elbow - no radiation of pain up or down arm denies assoc swelling or brusing no other joints with pain  Clinical Review Panels:  Prevention   Last Colonoscopy:  Location:  [facility].  (02/28/2006)  Immunizations   Last Tetanus Booster:  Historical (08/07/2004)   Last Flu Vaccine:  Fluvax 3+ (08/11/2009)   Last Pneumovax:  Historical (01/05/2006)  Lipid Management   Cholesterol:  270  (05/25/2006)   LDL (bad choesterol):  DEL (05/25/2006)   HDL (good cholesterol):  43.4 (05/25/2006)   Triglycerides:  414 (05/25/2006)  CBC   WBC:  8.3 (03/26/2008)   RBC:  4.22 (03/26/2008)   Hgb:  13.5 (03/26/2008)   Hct:  38.2 (03/26/2008)   Platelets:  228 (03/26/2008)   MCV  90.7 (03/26/2008)   MCHC  35.4 (03/26/2008)   RDW  13.1 (03/26/2008)   PMN:  54.7 (03/26/2008)   Lymphs:  35.4 (03/26/2008)   Monos:  6.3 (03/26/2008)   Eosinophils:  2.1 (03/26/2008)   Basophil:  1.5 (03/26/2008)   Current Medications (verified): 1)  Fluoxetine Hcl 20 Mg Caps (Fluoxetine Hcl) .Marland Kitchen.. 1 Tablet By Mouth Once Daily 2)  Furosemide 40 Mg Tabs (Furosemide) .... Take 1 Tablet By Mouth 3)  Synthroid 88 Mcg  Tabs (Levothyroxine Sodium) .... Take 1 Tablet By Mouth On Even Days 4)  Lithium Carbonate 300 Mg Caps (Lithium Carbonate) .... Take 1 Tablet Two Times A Day 5)  Prilosec 40 Mg Cpdr (Omeprazole) .... Take 1 Daily 6)  Lipitor 80 Mg  Tabs (Atorvastatin Calcium) .Marland Kitchen.. 1 Tablet By Mouth Once Daily 7)  Coumadin 5 Mg Tabs (Warfarin Sodium) .... Take As Directed By Coumadin Clinic. 8)  Aspirin 81 Mg Tabs (Aspirin) .... Once  Daily 9)  Centrum Silver  Tabs (Multiple Vitamins-Minerals) .... Once Daily 10)  Fe-Caps 250 Mg Cr-Caps (Ferrous Sulfate) .... Once Daily 11)  Synthroid 100 Mcg Tabs (Levothyroxine Sodium) .... Take 1 By Mouth On Odd Days 12)  Avapro 150 Mg Tabs (Irbesartan) .... Take One-Half  Tablet By Mouth Two Times A Day  Allergies (verified): 1)  ! Pcn 2)  ! Flagyl (Metronidazole) 3)  Codeine  Past History:  Past Surgical History: Last updated: 03/26/2008 Hysterectomy Pacemaker PTCA-Stent Right inferior parathyroidectomy  Family History: Last updated: 11/03/2008 Family History of Heart Disease: mother No FH of Colon Cancer:  Past Medical History: chronic bipolar disorder Coronary artery disease and hypertensive cardiovascular disease with hyperlipidemia. Chronic thyroid  dysfunction Penicillin and codeine allergies History of takotsubo GERD Diverticulosis Hyperlipidemia Tobacco abuse and COPD  MD rooster: endo - brennan cards -Graciela Husbands psyc - [facility] - [facility]  CORONARY ARTERY DISEASE, S/P PTCA (ICD-414.9) COUMADIN THERAPY (ICD-V58.61) ATRIOVENTRICULAR BLOCK, COMPLETE (ICD-426.0) DIVERTICULITIS-COLON (ICD-562.11), hx of BIPOLAR AFFECTIVE DISORDER (ICD-296.80) COLONIC POLYPS (ICD-211.3), hx of HYPOTHYROIDISM (ICD-244.9) HYPERTENSION (ICD-401.9) DYSLIPIDEMIA (ICD-272.4)  Social History: Widowed, 2 girls lives with daughter (who is Engineer, civil (consulting) at [employer]) Patient currently smokes.  Alcohol Use - no Daily Caffeine Use Illicit Drug Use - no  Review of Systems       see HPI above. I have reviewed all other systems and they were negative.   Physical Exam  General:  alert, well-developed, well-nourished, and cooperative to examination.    Eyes:  vision grossly intact; pupils equal, round and reactive to light.  conjunctiva and lids normal.   wears corrective lenses Ears:  normal pinnae bilaterally, without erythema, swelling, or tenderness to palpation. TMs clear, without effusion, or cerumen impaction. Hearing grossly normal bilaterally  Mouth:  teeth and gums in good repair; mucous membranes moist, without lesions or ulcers. oropharynx clear without exudate, no erythema.  Lungs:  normal respiratory effort, no intercostal retractions or use of accessory muscles; normal breath sounds bilaterally - no crackles and no wheezes.    Heart:  normal rate, regular rhythm, no murmur, and no rub. BLE without edema. normal DP pulses and normal cap refill in all 4 extremities    Abdomen:  soft, non-tender, normal bowel sounds, no distention; no masses and no appreciable hepatomegaly or splenomegaly.   Msk:  L elbow with tenderness over lateral epicondyle to palpation but no bruise or swelling or erythema - FROM but c/o pain with active  extension>flexion Neurologic:  alert & oriented X3 and cranial nerves II-XII symetrically intact.  strength normal in all extremities, sensation intact to light touch, and gait normal. speech fluent without dysarthria or aphasia; follows commands with good comprehension.  Psych:  Oriented X3, memory intact for recent and remote, normally interactive, good eye contact, not anxious appearing, not depressed appearing, and not agitated.      Impression & Recommendations:  Problem # 1:  ELBOW PAIN, LEFT (ICD-719.42)  fall x 1 mo ago - still pain with ext - check xray r/o fx advised pressure band for support pending xray results  Orders: T-Elbow Comp Left (73080TC)  Problem # 2:  DYSLIPIDEMIA (ICD-272.4)  fasting today - will check FLP and LFTs adjsut meds if/ as needed  Her updated medication list for this problem includes:    Lipitor 80 Mg Tabs (Atorvastatin calcium) .Marland Kitchen... 1 tablet by mouth once daily    HDL:43.4 (05/25/2006)  LDL:DEL (05/25/2006)  Chol:270 (05/25/2006)  Trig:414 (05/25/2006)  Orders: TLB-Lipid Panel (  80061-LIPID)  Problem # 3:  BIPOLAR AFFECTIVE DISORDER (ICD-296.80) cont Li and other med mgmt as per psyc -  appears very well controlled  Problem # 4:  HYPERTENSION (ICD-401.9)  The following medications were removed from the medication list:    Metoprolol Tartrate 25 Mg Tabs (Metoprolol tartrate) .Marland Kitchen... Take 1/2 tablet by mouth twice a day Her updated medication list for this problem includes:    Furosemide 40 Mg Tabs (Furosemide) .Marland Kitchen... Take 1 tablet by mouth    Avapro 150 Mg Tabs (Irbesartan) .Marland Kitchen... Take one-half  tablet by mouth two times a day  BP today: 124/78 Prior BP: 130/84 (03/22/2009)  Labs Reviewed: Creat: : 0.8 (04/09/2008)   Chol: 270 (05/25/2006)   HDL: 43.4 (05/25/2006)   LDL: DEL (05/25/2006)   TG: 414 (05/25/2006)  Problem # 5:  HYPOTHYROIDISM (ICD-244.9) mgmt as ongoing by endo -   Her updated medication list for this problem includes:     Synthroid 88 Mcg Tabs (Levothyroxine sodium) .Marland Kitchen... Take 1 tablet by mouth on even days    Synthroid 100 Mcg Tabs (Levothyroxine sodium) .Marland Kitchen... Take 1 by mouth on odd days  Labs Reviewed: TSH: 3.292 (03/04/2009)    Chol: 270 (05/25/2006)   HDL: 43.4 (05/25/2006)   LDL: DEL (05/25/2006)   TG: 414 (05/25/2006)  Problem # 6:  CORONARY ARTERY DISEASE, S/P PTCA (ICD-414.9) recent eval by cards with mgmt of coumadin as ongoing - no changes rec by me today The following medications were removed from the medication list:    Metoprolol Tartrate 25 Mg Tabs (Metoprolol tartrate) .Marland Kitchen... Take 1/2 tablet by mouth twice a day Her updated medication list for this problem includes:    Furosemide 40 Mg Tabs (Furosemide) .Marland Kitchen... Take 1 tablet by mouth    Aspirin 81 Mg Tabs (Aspirin) ..... Once daily    Avapro 150 Mg Tabs (Irbesartan) .Marland Kitchen... Take one-half  tablet by mouth two times a day  Complete Medication List: 1)  Fluoxetine Hcl 20 Mg Caps (Fluoxetine hcl) .Marland Kitchen.. 1 tablet by mouth once daily 2)  Furosemide 40 Mg Tabs (Furosemide) .... Take 1 tablet by mouth 3)  Synthroid 88 Mcg Tabs (Levothyroxine sodium) .... Take 1 tablet by mouth on even days 4)  Lithium Carbonate 300 Mg Caps (Lithium carbonate) .... Take 1 tablet two times a day 5)  Prilosec 40 Mg Cpdr (Omeprazole) .... Take 1 daily 6)  Lipitor 80 Mg Tabs (Atorvastatin calcium) .Marland Kitchen.. 1 tablet by mouth once daily 7)  Coumadin 5 Mg Tabs (Warfarin sodium) .... Take as directed by coumadin clinic. 8)  Aspirin 81 Mg Tabs (Aspirin) .... Once daily 9)  Centrum Silver Tabs (Multiple vitamins-minerals) .... Once daily 10)  Fe-caps 250 Mg Cr-caps (Ferrous sulfate) .... Once daily 11)  Synthroid 100 Mcg Tabs (Levothyroxine sodium) .... Take 1 by mouth on odd days 12)  Avapro 150 Mg Tabs (Irbesartan) .... Take one-half  tablet by mouth two times a day  Other Orders: TLB-Hepatic/Liver Function Pnl (80076-HEPATIC) Flu Vaccine 2yrs + (60454) Administration Flu vaccine  - MCR (U9811)  Patient Instructions: 1)  it was good to see you today.  2)  test(s) ordered today (xray and blood work) - your results will be posted on the phone tree for review in 48-72 hours from the time of test completion; call [phone] and enter your 9 digit MRN (listed above on this page, just below your name); if any changes need to be made or there are abnormal results, you will be contacted directly.  3)  no medication changes recommended today - 4)  wear a pressure band (like for tennis elbow) around your left elbow for support  5)  Please schedule a follow-up appointment in 3-6 months, sooner if problems.    Immunization History:  Pneumovax Immunization History:    Pneumovax:  historical (01/05/2006)  Tetanus/Td Immunization History:    Tetanus/Td:  historical (08/07/2004)    Colonoscopy  Procedure date:  02/28/2006  Findings:      Location:  [facility].  Results: Polyp.  Results: Diverticulosis.         Cardiac Cath  Procedure date:  09/16/2007  Findings:      Done @ [facility] Impression: 1. severe RCA stenosis wih successful PCI using a bare-metal stent. 2. Nonobstruction left anterior descending artery stenosis 3. Normal left circumflex 4. Normal left ventricular function Flu Vaccine Consent Questions     Do you have a history of severe allergic reactions to this vaccine? no    Any prior history of allergic reactions to egg and/or gelatin? no    Do you have a sensitivity to the preservative Thimersol? no    Do you have a past history of Guillan-Barre Syndrome? no    Do you currently have an acute febrile illness? no    Have you ever had a severe reaction to latex? no    Vaccine information given and explained to patient? yes    Are you currently pregnant? no    Lot Number:[id number]   Exp Date:11/04/2009   Manufacturer: [employer]    Site Given Right Deltoid IM 1. severe RCA stenosis wih successful PCI using a bare-metal  stent. 2. Nonobstruction left anterior descending artery stenosis 3. Normal left circumflex 4. Normal left ventricular function   .medflu

## 2010-09-06 NOTE — Letter (Signed)
Summary: Device-Delinquent Phone Journalist, newspaper, Main Office  [address]   [place], [place] [address]   Phone: [phone]  Fax: [phone]     February 11, 2010 MRN: [id number]   Stacy [name] [address] [place], [place]  [address]   Dear Ms. Barabas,  According to our records, you were scheduled for a device phone transmission on                              .     We did not receive any results from this check.  If you transmitted on your scheduled day, please call us to help troubleshoot your system.  If you forgot to send your transmission, please send one upon receipt of this letter.  Thank you,   Architectural technologist [facility]

## 2010-09-06 NOTE — Letter (Signed)
Summary: Device-Delinquent Phone Journalist, newspaper, Main Office  [address]   [place], [place] [address]   Phone: [phone]  Fax: [phone]     September 07, 2009 MRN: [id number]   Riti Dupras [address] [place], [place]  [address]   Dear Ms. Ambroise,  According to our records, you were scheduled for a device phone transmission on  September 01, 2009.     We did not receive any results from this check.  If you transmitted on your scheduled day, please call us to help troubleshoot your system.  If you forgot to send your transmission, please send one upon receipt of this letter.  Thank you,   Architectural technologist [facility]

## 2010-09-07 ENCOUNTER — Other Ambulatory Visit: Payer: Self-pay | Admitting: Internal Medicine

## 2010-09-07 ENCOUNTER — Ambulatory Visit (INDEPENDENT_AMBULATORY_CARE_PROVIDER_SITE_OTHER): Payer: Medicare HMO | Admitting: Internal Medicine

## 2010-09-07 ENCOUNTER — Encounter: Payer: Self-pay | Admitting: Internal Medicine

## 2010-09-07 DIAGNOSIS — E785 Hyperlipidemia, unspecified: Secondary | ICD-10-CM

## 2010-09-07 DIAGNOSIS — I4891 Unspecified atrial fibrillation: Secondary | ICD-10-CM

## 2010-09-07 DIAGNOSIS — R7309 Other abnormal glucose: Secondary | ICD-10-CM

## 2010-09-07 DIAGNOSIS — Z5181 Encounter for therapeutic drug level monitoring: Secondary | ICD-10-CM

## 2010-09-07 DIAGNOSIS — E039 Hypothyroidism, unspecified: Secondary | ICD-10-CM

## 2010-09-07 LAB — HEMOGLOBIN A1C: Hgb A1c MFr Bld: 6.3 % (ref 4.6–6.5)

## 2010-09-07 LAB — HEPATIC FUNCTION PANEL
ALT: 20 U/L (ref 0–35)
AST: 19 U/L (ref 0–37)
Albumin: 4 g/dL (ref 3.5–5.2)
Alkaline Phosphatase: 75 U/L (ref 39–117)

## 2010-09-07 LAB — LIPID PANEL
HDL: 50.2 mg/dL (ref >39.00)
Total CHOL/HDL Ratio: 3
Triglycerides: 152 mg/dL — ABNORMAL HIGH (ref 0.0–149.0)

## 2010-09-08 DIAGNOSIS — E119 Type 2 diabetes mellitus without complications: Secondary | ICD-10-CM | POA: Insufficient documentation

## 2010-09-08 NOTE — Letter (Signed)
Summary: Device-Delinquent Phone Journalist, newspaper, Main Office  [address]   [place], [place] [address]   Phone: [phone]  Fax: [phone]     August 27, 2010 MRN: [id number]   Stacy [name] [address] [place], [place]  [address]   Dear Ms. Whorley,  According to our records, you were scheduled for a device phone transmission on 08-25-2010.     We did not receive any results from this check.  If you transmitted on your scheduled day, please call us to help troubleshoot your system.  If you forgot to send your transmission, please send one upon receipt of this letter.  Thank you,   Architectural technologist [facility]

## 2010-09-08 NOTE — Progress Notes (Signed)
Summary: Referral  Phone Note Call from Patient   Caller: Patient [phone] Summary of Call: Pt called stating she was seen at [facility] for "knot" on jawline and advised that she needed referral to ENT MD. Pt is requesting referral, will she need OV with VAL first? Initial call taken by: Margaret Pyle, CMA,  August 18, 2010 11:21 AM  Follow-up for Phone Call        yes - need OV here to eval same or get records so we have documentation on emr of the problem Follow-up by: Newt Lukes MD,  August 18, 2010 12:34 PM  Additional Follow-up for Phone Call Additional follow up Details #1::        pt says she was seen by her dentist and an ENT MD that Dx her as having a blocked salivary gland. Additional Follow-up by: Margaret Pyle, CMA,  August 22, 2010 1:42 PM

## 2010-09-09 ENCOUNTER — Ambulatory Visit (INDEPENDENT_AMBULATORY_CARE_PROVIDER_SITE_OTHER): Payer: Medicare HMO

## 2010-09-09 ENCOUNTER — Encounter: Payer: Self-pay | Admitting: Internal Medicine

## 2010-09-09 DIAGNOSIS — E119 Type 2 diabetes mellitus without complications: Secondary | ICD-10-CM

## 2010-09-12 ENCOUNTER — Telehealth: Payer: Self-pay | Admitting: Internal Medicine

## 2010-09-13 ENCOUNTER — Encounter (INDEPENDENT_AMBULATORY_CARE_PROVIDER_SITE_OTHER): Payer: Self-pay | Admitting: *Deleted

## 2010-09-13 ENCOUNTER — Telehealth: Payer: Self-pay | Admitting: Internal Medicine

## 2010-09-14 NOTE — Assessment & Plan Note (Signed)
Summary: Diabetes   Vitals Entered By: Orlan Leavens RMA (September 09, 2010 10:59 AM) CC: New Diabete/Demonstrate how to use Blood sugar monitor and test everyday Is Patient Diabetic? Yes CBG Result 103   CC:  New Diabete/Demonstrate how to use Blood sugar monitor and test everyday.  Allergies: 1)  ! Pcn 2)  ! Flagyl (Metronidazole) 3)  Codeine   Complete Medication List: 1)  Fluoxetine Hcl 20 Mg Caps (Fluoxetine hcl) .Marland Kitchen.. 1 tablet by mouth once daily 2)  Synthroid 88 Mcg Tabs (Levothyroxine sodium) .... Take 1 tablet by mouth on even days 3)  Lithium Carbonate 300 Mg Caps (Lithium carbonate) .... Take 1 tablet two times a day 4)  Prilosec 40 Mg Cpdr (Omeprazole) .... Take 1 daily 5)  Lipitor 80 Mg Tabs (Atorvastatin calcium) .Marland Kitchen.. 1 tablet by mouth once daily 6)  Pradaxa 150 Mg Caps (Dabigatran etexilate mesylate) .... Take one tab by mouth two times a day 7)  Synthroid 100 Mcg Tabs (Levothyroxine sodium) .... Take 1 by mouth on odd days 8)  Avapro 150 Mg Tabs (Irbesartan) .... Take one-half  tablet by mouth two times a day 9)  Calcium 500 Mg Tabs (Calcium) .... Take 1 two times a day 10)  Multivitamins Tabs (Multiple vitamin) .... Take 1 by mouth once daily 11)  Metformin Hcl 500 Mg Xr24h-tab (Metformin hcl) .Marland Kitchen.. 1 by mouth  every morning 12)  Onetouch Ultra Blue Strp (Glucose blood) .... Check blood sugars once daily  dx 250.00 13)  Onetouch Lancets Misc (Lancets) .... Use as once daily for blood sugars dx 250.00  Other Orders: Glucose, (CBG) (86578) Prescriptions: ONETOUCH LANCETS  MISC (LANCETS) use as once daily for blood sugars dx 250.00  #30 x 3   Entered by:   Orlan Leavens RMA   Authorized by:   Newt Lukes MD   Signed by:   Newt Lukes MD on 09/09/2010   Method used:   Electronically to        [facility] [phone]* (retail)       [address]       [place], [place]  [address]       Ph: [phone]       Fax: [phone]   RxID:    [id number] ONETOUCH ULTRA BLUE  STRP (GLUCOSE BLOOD) check blood sugars once daily  dx 250.00  #30 x 3   Entered by:   Orlan Leavens RMA   Authorized by:   Newt Lukes MD   Signed by:   Newt Lukes MD on 09/09/2010   Method used:   Electronically to        [facility] [phone]* (retail)       [address]       [place], [place]  [address]       Ph: [phone]       Fax: [phone]   RxID:   [id number]    Orders Added: 1)  Glucose, (CBG) [82962]    Laboratory Results   Blood Tests     CBG Random:: 103mg /dL

## 2010-09-14 NOTE — Assessment & Plan Note (Signed)
Summary: OV---B4 REFERRED TO  ENT---STC   Vital Signs:  Patient profile:   70 year old female Height:      67 inches (170.18 cm) Weight:      147 pounds (66.82 kg) BMI:     23.11 O2 Sat:      96 % on Room air Temp:     97.8 degrees F (36.56 degrees C) oral Pulse rate:   81 / minute BP sitting:   132 / 80  (left arm) Cuff size:   regular  Vitals Entered By: Orlan Leavens RMA (September 07, 2010 10:44 AM)  O2 Flow:  Room air CC: follow-up visit Is Patient Diabetic? No Pain Assessment Patient in pain? no        Primary Care Provider:  Newt Lukes MD  CC:  follow-up visit.  History of Present Illness: here for followup -  hypothyroidism - follows with endo - no recent dose changes in thyroid replacment - no skin or weight changes  bipolar dz with depression - Li levels monitored periodically but not in past 6 months - no dose adjustments or changes -symptoms well controlled - follows with psyc several times each year and as needed - does note long standing shaking of hands and arms, most notable in AM but not every AM - ?getting worse - has seen neuro for same and felt related to Li  dyslipidemia -  reports compliance with ongoing medical treatment and no changes in medication dose or frequency. denies adverse side effects related to current therapy. no myalgia  AF - on pradaxa for anticoag = follows with cards for same  CAD issues - reviewed hx - reports compliance with ongoing medical treatment and no changes in medication dose or frequency. denies adverse side effects related to current therapy.   hyperglycemia? noted on incidental labs 06/2010 at psyc and believes she was fasting - no fh dm or personal hx same - no pu/pd  Clinical Review Panels:  Immunizations   Last Tetanus Booster:  Historical (08/07/2004)   Last Flu Vaccine:  Historical given @ [facility], [address] (05/26/2010)   Last Pneumovax:  Historical (01/05/2006)  Lipid Management   Cholesterol:   158 (08/11/2009)   LDL (bad choesterol):  DEL (05/25/2006)   HDL (good cholesterol):  50.80 (08/11/2009)   Triglycerides:  414 (05/25/2006)  CBC   WBC:  12.8 (11/22/2009)   RBC:  4.09 (11/22/2009)   Hgb:  13.1 (11/22/2009)   Hct:  38.1 (11/22/2009)   Platelets:  208.0 (11/22/2009)   MCV  93.3 (11/22/2009)   MCHC  34.2 (11/22/2009)   RDW  13.1 (11/22/2009)   PMN:  68.7 (11/22/2009)   Lymphs:  25.6 (11/22/2009)   Monos:  4.1 (11/22/2009)   Eosinophils:  1.1 (11/22/2009)   Basophil:  0.5 (11/22/2009)  Complete Metabolic Panel   Glucose:  112 (06/24/2010)   Sodium:  138 (06/24/2010)   Potassium:  4.9 (06/24/2010)   Chloride:  104 (06/24/2010)   CO2:  27 (06/24/2010)   BUN:  12 (06/24/2010)   Creatinine:  0.87 (06/24/2010)   Albumin:  3.8 (08/11/2009)   Total Protein:  7.1 (08/11/2009)   Calcium:  9.8 (06/24/2010)   Total Bili:  0.7 (08/11/2009)   Alk Phos:  69 (08/11/2009)   SGPT (ALT):  22 (08/11/2009)   SGOT (AST):  20 (08/11/2009)   -  Date:  06/24/2010    BG Random: 112    BUN: 12    Creatinine: 0.87  Sodium: 138    Potassium: 4.9    Chloride: 104    CO2 Total: 27    Calcium: 9.8  Current Medications (verified): 1)  Fluoxetine Hcl 20 Mg Caps (Fluoxetine Hcl) .Marland Kitchen.. 1 Tablet By Mouth Once Daily 2)  Furosemide 40 Mg Tabs (Furosemide) .... Take 1 Tablet By Mouth 3)  Synthroid 88 Mcg  Tabs (Levothyroxine Sodium) .... Take 1 Tablet By Mouth On Even Days 4)  Lithium Carbonate 300 Mg Caps (Lithium Carbonate) .... Take 1 Tablet Two Times A Day 5)  Prilosec 40 Mg Cpdr (Omeprazole) .... Take 1 Daily 6)  Lipitor 80 Mg  Tabs (Atorvastatin Calcium) .Marland Kitchen.. 1 Tablet By Mouth Once Daily 7)  Pradaxa 150 Mg Caps (Dabigatran Etexilate Mesylate) .... Take One Tab By Mouth Two Times A Day 8)  Fe-Caps 250 Mg Cr-Caps (Ferrous Sulfate) .... Once Daily 9)  Synthroid 100 Mcg Tabs (Levothyroxine Sodium) .... Take 1 By Mouth On Odd Days 10)  Avapro 150 Mg Tabs (Irbesartan) .... Take  One-Half  Tablet By Mouth Two Times A Day 11)  Calcium 500 Mg Tabs (Calcium) .... Take 1 Two Times A Day 12)  Multivitamins  Tabs (Multiple Vitamin) .... Take 1 By Mouth Once Daily  Allergies (verified): 1)  ! Pcn 2)  ! Flagyl (Metronidazole) 3)  Codeine  Past History:  Past Medical History: chronic bipolar disorder Coronary artery disease s/p PTCA complete AVB s/p PPM--Medtronic Adapta ADDr01 AFib - pradaxa hypertension hyperlipidemia hypothyroid Penicillin and codeine allergies History of takotsubo GERD Diverticulosis Tobacco abuse and COPD  MD roster: endo - b[facility] cards -Graciela Husbands psyc - [facility] - [facility] - [facility]   Review of Systems  The patient denies anorexia, fever, weight gain, chest pain, headaches, and abdominal pain.    Physical Exam  General:  alert, well-developed, well-nourished, and cooperative to examination.    Lungs:  normal respiratory effort, no intercostal retractions or use of accessory muscles; normal breath sounds bilaterally - no crackles and no wheezes.    Heart:  normal rate, irregular rhythm, no murmur, and no rub. BLE without edema. Psych:  Oriented X3, memory intact for recent and remote, normally interactive, good eye contact, not anxious appearing, not depressed appearing, and not agitated.      Impression & Recommendations:  Problem # 1:  HYPERGLYCEMIA (ICD-790.29)  112 fasting 06/2010 - check a1c now -  Orders: TLB-A1C / Hgb A1C (Glycohemoglobin) (83036-A1C)  Labs Reviewed: Creat: 0.87 (06/24/2010)     Problem # 2:  DYSLIPIDEMIA (ICD-272.4)  Her updated medication list for this problem includes:    Lipitor 80 Mg Tabs (Atorvastatin calcium) .Marland Kitchen... 1 tablet by mouth once daily  Orders: TLB-Lipid Panel (80061-LIPID)  Labs Reviewed: SGOT: 20 (08/11/2009)   SGPT: 22 (08/11/2009)   HDL:50.80 (08/11/2009), 43.4 (05/25/2006)  LDL:DEL (05/25/2006)  Chol:158 (08/11/2009), 270 (05/25/2006)  Trig:208.0  (08/11/2009), 414 (05/25/2006)  Problem # 3:  HYPOTHYROIDISM (ICD-244.9)  Her updated medication list for this problem includes:    Synthroid 88 Mcg Tabs (Levothyroxine sodium) .Marland Kitchen... Take 1 tablet by mouth on even days    Synthroid 100 Mcg Tabs (Levothyroxine sodium) .Marland Kitchen... Take 1 by mouth on odd days  managed by endo -  Labs Reviewed: TSH: 2.67 (01/17/2010)    Chol: 158 (08/11/2009)   HDL: 50.80 (08/11/2009)   LDL: DEL (05/25/2006)   TG: 208.0 (08/11/2009)  Problem # 4:  ATRIAL FIBRILLATION (ICD-427.31) per cards 01/2010 -  The patient is now on Pradaxa.  Complete Medication List:  1)  Fluoxetine Hcl 20 Mg Caps (Fluoxetine hcl) .Marland Kitchen.. 1 tablet by mouth once daily 2)  Synthroid 88 Mcg Tabs (Levothyroxine sodium) .... Take 1 tablet by mouth on even days 3)  Lithium Carbonate 300 Mg Caps (Lithium carbonate) .... Take 1 tablet two times a day 4)  Prilosec 40 Mg Cpdr (Omeprazole) .... Take 1 daily 5)  Lipitor 80 Mg Tabs (Atorvastatin calcium) .Marland Kitchen.. 1 tablet by mouth once daily 6)  Pradaxa 150 Mg Caps (Dabigatran etexilate mesylate) .... Take one tab by mouth two times a day 7)  Synthroid 100 Mcg Tabs (Levothyroxine sodium) .... Take 1 by mouth on odd days 8)  Avapro 150 Mg Tabs (Irbesartan) .... Take one-half  tablet by mouth two times a day 9)  Calcium 500 Mg Tabs (Calcium) .... Take 1 two times a day 10)  Multivitamins Tabs (Multiple vitamin) .... Take 1 by mouth once daily  Other Orders: TLB-Hepatic/Liver Function Pnl (80076-HEPATIC)  Patient Instructions: 1)  it was good to see you today. 2)  medications reviewed, no new changes today -  3)  test(s) ordered today - your results will be posted on the phone tree for review in 48-72 hours from the time of test completion; call [phone] and enter your 9 digit MRN (listed above on this page, just below your name); if any changes need to be made or there are abnormal results, you will be contacted directly.  4)  no medication changes  recommeneded at this time 5)  Please schedule a follow-up appointment in 4-6 months, sooner if problems.    Orders Added: 1)  TLB-Lipid Panel [80061-LIPID] 2)  TLB-Hepatic/Liver Function Pnl [80076-HEPATIC] 3)  TLB-A1C / Hgb A1C (Glycohemoglobin) [83036-A1C] 4)  Est. Patient Level IV [44034]

## 2010-09-19 ENCOUNTER — Encounter: Payer: Self-pay | Admitting: Internal Medicine

## 2010-09-20 ENCOUNTER — Encounter: Payer: Medicare HMO | Attending: Internal Medicine | Admitting: *Deleted

## 2010-09-20 ENCOUNTER — Telehealth: Payer: Self-pay | Admitting: Internal Medicine

## 2010-09-20 ENCOUNTER — Encounter: Payer: Self-pay | Admitting: Internal Medicine

## 2010-09-20 DIAGNOSIS — Z713 Dietary counseling and surveillance: Secondary | ICD-10-CM | POA: Insufficient documentation

## 2010-09-20 DIAGNOSIS — E119 Type 2 diabetes mellitus without complications: Secondary | ICD-10-CM | POA: Insufficient documentation

## 2010-09-21 ENCOUNTER — Encounter: Payer: Self-pay | Admitting: Internal Medicine

## 2010-09-21 ENCOUNTER — Telehealth: Payer: Self-pay | Admitting: Internal Medicine

## 2010-09-22 NOTE — Progress Notes (Signed)
Summary: referral  Phone Note Call from Patient Call back at Home Phone [phone]   Caller: Patient Summary of Call: Pt called requesting referral to [facility] Initial call taken by: Margaret Pyle, CMA,  September 12, 2010 1:17 PM  Follow-up for Phone Call        ok - order done Follow-up by: Newt Lukes MD,  September 12, 2010 2:46 PM  Additional Follow-up for Phone Call Additional follow up Details #1::        pt advised that she will be contacted by [facility] with appt info Additional Follow-up by: Margaret Pyle, CMA,  September 12, 2010 3:06 PM

## 2010-09-22 NOTE — Letter (Signed)
Summary: Results Follow-up Letter  [facility]  [address] [place], [place] [address]   Phone: [phone]  Fax: [phone]    09/13/2010  [address] [place], [place]  [address]  [place]  Dear Ms. Gidley,   The following are the results of your recent test 09/07/10. Per MD labs show early type 2 diabetes; other labs are ok. Will start low dose Metformin XR 500mg  take once daily. Prescription has been sent to your pharmacy. Will recheck labs at return office visit in 3 months to monitor sugar control. Please also get glucometer and supplies to check once each AM before breakfast. Keep a log og blood sugars and call if > 200    Sincerely,  Orlan Leavens RMA/Dr. Rene Paci [facility]

## 2010-09-22 NOTE — Progress Notes (Signed)
Summary: BS  Phone Note Call from Patient Call back at Home Phone [phone]   Caller: Patient Summary of Call: Left msg on triage stating need written information on how to use blood sugar monitor, and when she suppose to check BS. Per md she has sent referral for pt to see DM educator. [facility] has fx info waiting on there response. Let pt know about referral and she should check BS in AM before breakfast. Also sending pt letter with md response from labs from 09/07/10 Initial call taken by: Orlan Leavens RMA,  September 13, 2010 2:45 PM

## 2010-09-26 ENCOUNTER — Encounter (INDEPENDENT_AMBULATORY_CARE_PROVIDER_SITE_OTHER): Payer: Self-pay | Admitting: *Deleted

## 2010-09-28 NOTE — Progress Notes (Signed)
Summary: DIABETIC SUPPLIES ORDER---  Phone Note Call from Patient   Call For: Newt Lukes MD Summary of Call: Per John/[facility]--checking up on order for diabetic supplies--Ph#:  [phone]----DGU:  [id number] Initial call taken by: Ivar Bury,  September 21, 2010 9:46 AM  Follow-up for Phone Call        signed yest  - given to lucy today to fax Follow-up by: Newt Lukes MD,  September 21, 2010 11:34 AM  Additional Follow-up for Phone Call Additional follow up Details #1::        Notified John md sign faxed order Additional Follow-up by: Orlan Leavens RMA,  September 21, 2010 12:05 PM

## 2010-09-28 NOTE — Letter (Signed)
Summary: [facility]  [facility]   Imported By: Lester Chadwick 09/23/2010 10:33:05  _____________________________________________________________________  External Attachment:    Type:   Image     Comment:   External Document

## 2010-09-28 NOTE — Medication Information (Signed)
Summary: Losartan / [facility]  Losartan / [facility]   Imported By: Lennie Odor 09/22/2010 09:34:32  _____________________________________________________________________  External Attachment:    Type:   Image     Comment:   External Document

## 2010-09-28 NOTE — Progress Notes (Signed)
Summary: losartan  Phone Note From Pharmacy   [facility] Reason for Call: Patient requests substitution Summary of Call: Md recieved fax stating pt is requesting a new rx for Losartan as an approve change from Avapro 150mg  provide that you deem this clinically appropriate. With your approval this change could potentially save member $26.00 a year. MD agrred to change to Losartan fax form back to [facility]. Will update EMR Initial call taken by: Orlan Leavens RMA,  September 20, 2010 8:20 AM    New/Updated Medications: LOSARTAN POTASSIUM 50 MG TABS (LOSARTAN POTASSIUM) take 1 by mouth once daily Prescriptions: LOSARTAN POTASSIUM 50 MG TABS (LOSARTAN POTASSIUM) take 1 by mouth once daily  #90 x 3   Entered by:   Orlan Leavens RMA   Authorized by:   Newt Lukes MD   Signed by:   Orlan Leavens RMA on 09/20/2010   Method used:   Historical   RxID:   [id number]

## 2010-09-28 NOTE — Medication Information (Signed)
Summary: [facility]   Imported By: Sherian Rein 09/23/2010 13:49:58  _____________________________________________________________________  External Attachment:    Type:   Image     Comment:   External Document

## 2010-10-04 NOTE — Cardiovascular Report (Signed)
Summary: Office Visit Remote   Office Visit Remote   Imported By: Roderic Ovens 09/30/2010 11:21:41  _____________________________________________________________________  External Attachment:    Type:   Image     Comment:   External Document

## 2010-10-04 NOTE — Letter (Signed)
Summary: Remote Device Check  [facility], Main Office  [address]   [place], [place] [address]   Phone: [phone]  Fax: [phone]     September 26, 2010 MRN: [id number]   Stacy [name] [address] [place], [place]  [address]   Dear Ms. Parish,   Your remote transmission was recieved and reviewed by your physician.  All diagnostics were within normal limits for you.   __X____Your next office visit is scheduled for:  April 2012 with Dr Graciela Husbands. Please call our office to schedule an appointment.    Sincerely,  Vella Kohler

## 2010-11-01 ENCOUNTER — Encounter: Payer: Medicare HMO | Attending: Internal Medicine | Admitting: *Deleted

## 2010-11-01 DIAGNOSIS — Z713 Dietary counseling and surveillance: Secondary | ICD-10-CM | POA: Insufficient documentation

## 2010-11-01 DIAGNOSIS — E119 Type 2 diabetes mellitus without complications: Secondary | ICD-10-CM | POA: Insufficient documentation

## 2010-11-06 ENCOUNTER — Encounter: Payer: Self-pay | Admitting: Internal Medicine

## 2010-11-11 LAB — CBC
HCT: 42.8 % (ref 36.0–46.0)
Hemoglobin: 13.3 g/dL (ref 12.0–15.0)
Hemoglobin: 14.5 g/dL (ref 12.0–15.0)
MCHC: 33.6 g/dL (ref 30.0–36.0)
RBC: 4.25 MIL/uL (ref 3.87–5.11)
RBC: 4.58 MIL/uL (ref 3.87–5.11)
WBC: 9.1 10*3/uL (ref 4.0–10.5)
WBC: 8.4 10*3/uL (ref 4.0–10.5)

## 2010-11-11 LAB — BASIC METABOLIC PANEL
Chloride: 104 mEq/L (ref 96–112)
Creatinine, Ser: 0.87 mg/dL (ref 0.4–1.2)
GFR calc Af Amer: >60 mL/min (ref >60)
GFR calc Af Amer: >60 mL/min (ref >60)
GFR calc non Af Amer: >60 mL/min (ref >60)
Potassium: 4 mEq/L (ref 3.5–5.1)
Potassium: 4 mEq/L (ref 3.5–5.1)
Sodium: 141 mEq/L (ref 135–145)
Sodium: 143 mEq/L (ref 135–145)

## 2010-11-11 LAB — PROTIME-INR
INR: 1.2 (ref 0.00–1.49)
INR: 2 — ABNORMAL HIGH (ref 0.00–1.49)

## 2010-11-11 LAB — APTT
aPTT: 36 seconds (ref 24–37)
aPTT: 43 seconds — ABNORMAL HIGH (ref 24–37)

## 2010-11-17 ENCOUNTER — Encounter: Payer: Self-pay | Admitting: Internal Medicine

## 2010-11-17 ENCOUNTER — Ambulatory Visit (INDEPENDENT_AMBULATORY_CARE_PROVIDER_SITE_OTHER): Payer: Medicare HMO | Admitting: Internal Medicine

## 2010-11-17 VITALS — BP 108/64 | HR 67 | Ht 66.0 in | Wt 144.8 lb

## 2010-11-17 DIAGNOSIS — F17200 Nicotine dependence, unspecified, uncomplicated: Secondary | ICD-10-CM

## 2010-11-17 DIAGNOSIS — I442 Atrioventricular block, complete: Secondary | ICD-10-CM

## 2010-11-17 DIAGNOSIS — I4891 Unspecified atrial fibrillation: Secondary | ICD-10-CM

## 2010-11-17 DIAGNOSIS — I259 Chronic ischemic heart disease, unspecified: Secondary | ICD-10-CM

## 2010-11-17 DIAGNOSIS — Z950 Presence of cardiac pacemaker: Secondary | ICD-10-CM

## 2010-11-17 NOTE — Assessment & Plan Note (Signed)
Intermittent atrial fibrillation. Relatively infrequent. On Pradaxa

## 2010-11-17 NOTE — Assessment & Plan Note (Addendum)
The patient's device was interrogated and the information was fully reviewed.  The device was reprogrammed to A-D. To minimize ventricular pacing and preserve battery

## 2010-11-17 NOTE — Assessment & Plan Note (Signed)
We spent about 10 minutes discussing different strategies to try to stop smoking again.

## 2010-11-17 NOTE — Assessment & Plan Note (Signed)
Without chest pain 

## 2010-11-17 NOTE — Patient Instructions (Signed)
Your physician recommends that you schedule a follow-up appointment in: YEAR WITH DR KLEIN  Your physician recommends that you continue on your current medications as directed. Please refer to the Current Medication list given to you today. 

## 2010-11-17 NOTE — Progress Notes (Signed)
HPI  Stacy [name] is a 70 y.o. female seen in followup for  bradycardia and  intermittent complete heart block.  She is status post pacemaker implantation.  She has a history of coronary disease and has had problems with recurrent chest pain. We undertook Myoview scan in the 2010. She then underwent catheterization demonstrating no obstruction; her previously implanted stent was patent her ejection fraction was normal  She's had no significant chest pain. There's been no change in her breathing status. There is no peripheral edema.  Is to smoke although she did try to stop for a few days earlier this year.  She is tolerating her Pradaxa.    Past Medical History  Diagnosis Date  . Chronic bipolar disorder   . Coronary artery disease     s/p PTCA  . Complete AV block     s/p PPM--MEDTRONIC ADAPTA ADDr01  . Afib     ON PRADAXA  . Hypertension   . Hyperlipidemia   . Thyroid disease     HYPOTHROID  . Takotsubo syndrome   . GERD (gastroesophageal reflux disease)   . Diverticulosis   . Tobacco abuse     COPD  . COPD (chronic obstructive pulmonary disease)     TOBACCO ABUSE  . H/O: hysterectomy     Past Surgical History  Procedure Date  . Insert / replace / remove pacemaker     MEDTRONIC ADAPTA ADDr01  . Parathyroidectomy     RIGHT INFERIOR    Current Outpatient Prescriptions  Medication Sig Dispense Refill  . atorvastatin (LIPITOR) 80 MG tablet Take 80 mg by mouth daily.        . Calcium Carbonate (CALCIUM 500 PO) Take 1 tablet by mouth 2 (two) times daily.        . dabigatran (PRADAXA) 150 MG CAPS Take 150 mg by mouth every 12 (twelve) hours.        Marland Kitchen FLUoxetine (PROZAC) 20 MG capsule Take 20 mg by mouth daily.        Marland Kitchen glucose blood (ONE TOUCH ULTRA TEST) test strip 1 each by Other route as needed. Use as instructed       . levothyroxine (SYNTHROID, LEVOTHROID) 100 MCG tablet Take 100 mcg by mouth. TAKE 1 TABLET ON ODD DAYS.....Marland KitchenALTERNATE WITH 88 MCG ON EVEN DAYS        . levothyroxine (SYNTHROID, LEVOTHROID) 88 MCG tablet Take 88 mcg by mouth. TAKE 1 TABLET ON EVEN DAYS.Marland KitchenMarland KitchenALTERNATE THIS WITH 100 MCG ON ODD DAYS       . lithium 300 MG capsule Take 300 mg by mouth 2 (two) times daily with a meal.        . losartan (COZAAR) 50 MG tablet Take 50 mg by mouth daily.        . metFORMIN (GLUCOPHAGE-XR) 500 MG 24 hr tablet Take 500 mg by mouth daily with breakfast.        . Multiple Vitamin (MULTIVITAMIN) tablet Take 1 tablet by mouth daily.        Marland Kitchen omeprazole (PRILOSEC) 40 MG capsule Take 40 mg by mouth daily.        . ONE TOUCH LANCETS MISC by Does not apply route.        . NON FORMULARY         Allergies  Allergen Reactions  . Codeine     REACTION: vomiting  . Metronidazole   . Penicillins     REACTION: throat swelling, rash    Review of Systems  negative except from HPI and PMH  Physical Exam Well developed and well nourished Caucasian femalein no acute distress HENT normal E scleral and icterus clear Neck Supple JVP flat; carotids brisk and full Clear to ausculation Regular rate and rhythm, no murmurs Positive S4Soft with active bowel sounds No clubbing cyanosis and edema Alert and oriented, grossly normal motor and sensory function Skin Warm and Dry  ECG P. Synchronous pacing  Assessment and  Plan

## 2010-11-17 NOTE — Assessment & Plan Note (Signed)
Intermittent, we have programmed her device A-D.

## 2010-12-20 NOTE — Assessment & Plan Note (Signed)
[facility]                         ELECTROPHYSIOLOGY OFFICE NOTE   SHAASIA, ODLE                    MRN:          [id number]  DATE:11/18/2007                            DOB:          Aug 07, 1941    Ms. Stacy [name] is seen.  She has complete heart block.  She is status post  pacemaker implantation.  She continues to smoke and has paroxysmal  atrial fibrillation.  She is doing pretty well.   PHYSICAL EXAMINATION:  Her blood pressure was mildly elevated at 144/93.  Her pulse is 85.  LUNGS:  Clear.  CARDIAC:  Heart sounds were regular.  EXTREMITIES:  Without edema.   Interrogation of her Medtronic pacemaker demonstrated a T wave of 1.4  with an impedance of 563, a threshold of 0.75 at 0.4.  The R waves are  11.2 with an impedance of 1080 with a threshold of 1.25 and 0.4.  Battery voltage was 2.78.  There were three minimal mode switch  episodes.   IMPRESSION:  1. Paroxysmal atrial fibrillation.  2. Hypertension.  3. Cigarette abuse.  4. Status post pacemaker for the above.   Ms. Beighley is stable.  We will see her again in one year's time.  She  will begin remote followup in three months.     Duke Salvia, MD, [facility]  Electronically Signed    SCK/Med[facility]  DD: 11/18/2007  DT: 11/18/2007  Job #: [id number]

## 2010-12-20 NOTE — Consult Note (Signed)
Stacy [name], SPOONEMORE             ACCOUNT NO.:  [id number]   MEDICAL RECORD NO.:  [id number]          PATIENT TYPE:  INP   LOCATION:  5149                         FACILITY:  [facility]   PHYSICIAN:  Stacy [name], M.D.DATE OF BIRTH:  August 06, 1941   DATE OF CONSULTATION:  02/27/2008  DATE OF DISCHARGE:                                 CONSULTATION   CONSULTING SURGEON:  Stacy [name], M.D.   PRIMARY CARE PHYSICIAN:  Stacy [name], M.D.   REASON FOR CONSULTATION:  Sigmoid diverticulitis with liver abscess.   HISTORY PRESENT ILLNESS:  Stacy [name] is a 70 year old female patient  with multiple medical problems on Coumadin anticoagulation with known  diverticular disease, but has never had a severe episode of  diverticulitis nor been hospitalized for same.  She developed vague  constitutional symptoms about a week ago consisting of anorexia, chills,  and fever.  She subsequently felt nausea and vomiting and some vague  left lower quadrant discomfort.  She presented to her primary care  physician where she was found to have leukocytosis in the 21,000 range.  CT of the abdomen was ordered that demonstrated sigmoid diverticulitis  with small sigmoid abscess as well as 2 fluid collections around the  liver that were also concerning for abscess.  The patient was admitted,  started on empiric antibiotic therapy.  ID was consulted, and since that  time, the patient's symptoms have improved and her white count is  decreased.  Because of the abscesses, a surgical consultation has been  requested.   REVIEW OF SYSTEMS:  CONSTITUTIONAL:  As per the history present illness  with fevers and chills x1 week, fever up to 103.  GI:  Vague left lower  quadrant pain as mentioned.  She has been having a bloaty gassy feeling  prior to admit this has improved; intermittent nausea, vomiting.  She is  currently hungry.  No further nausea or vomiting.  Otherwise, other  review of systems categories  are negative.   FAMILY MEDICAL HISTORY:  Noncontributory.   SOCIAL HISTORY:  Positive for tobacco.  No alcohol.  No drugs.  She  lives with her daughters in [place].   PAST MEDICAL HISTORY:  1. Hypertension.  2. Hypothyroidism.  3. Bipolar disorder.  4. Dyslipidemia.  5. GERD.  6. Diverticulosis.  7. COPD and tobacco abuse.  8. Paroxysmal atrial fibrillation, complete heart block.  9. CAD with non-ST segment elevated MI in February 2009, EF 60% on      cardiac catheterization at that time.   PAST SURGICAL HISTORY:  1. Medtronic dual chamber pacemaker in November 2007.  2. Right inferior parathyroidectomy in May 2007 by Dr. Gerrit [name].   ALLERGIES:  CODEINE and PENICILLIN.   CURRENT MEDICATIONS:  Prozac, Lasix, Synthroid, lisinopril, Protonix,  Ativan, and lithium.  She has received pneumococcal vaccination this  admission.  She is on Cipro and Flagyl IV.  She is on IV fluids.  She  has p.r.n. Ambien and Tylenol.  The patient was on generic Coumadin  prior to admission.  She is currently not receiving this medication, and  since her INR is  therapeutic, she is not on a bridge.   PHYSICAL EXAMINATION:  CONSTITUTIONAL: GENERAL: Pleasant female patient  complaining of being hungry, states she has improved since arrival.  VITAL SIGNS:  Temperature 97, BP 118/69, pulse 77 and regular, and  respirations 18.  EYES:  Sclerae are noninjected.  Conjunctivae are pink.  Pupils are  equal and reactive to light.  EARS, NOSE, MOUTH, and THROAT:  Ears are symmetrical in appearance  No  otorrhea.  Nose is midline.  No rhinorrhea.  Mouth, oral mucous  membranes are pink and moist.  NECK:  Trachea is midline.  LUNGS:  Respirations effort is nonlabored.  Bilateral lung sounds clear  to auscultation.  She is satting 94% on room air.  CARDIOVASCULAR:  Heart sounds are S1 and S2, very crisp, especially the diastolic heart  sound.  No JVD.  Pulses regular.  No rubs, murmurs, thrills, or gallops.   No peripheral edema.  Pulses are 1+ bilaterally, radial and pedal.  ABDOMEN:  Soft.  Bowel sounds are present.  She is mildly tender in left  lower quadrant with deep palpation.  No guarding.  No rebounding.  MUSCULOSKELETAL:  Extremities are symmetrical in appearance without  clubbing or cyanosis.  NEURO:  Cranial nerves 2 through 12 are grossly intact.  EXTREMITIES:  Sensation in the upper and lower extremities bilaterally  is intact grossly.  Motor strength is 5/5.  PSYCH:  The patient is oriented to time, person, place, and situation.  Her affect is appropriate to current situation.   LAB:  CEA is 3.0.  CA19-9 is 15.7.  CA-125 is 6.0.  Sodium 140,  potassium 4.0, CO2 28, glucose 148, BUN 4, and creatinine 0.85.  PT 2.9.  LFTs were normal on February 25, 2008.  White count is now down to 13,700,  hemoglobin 11, platelets 314,000.   DIAGNOSTICS:  Ultrasound repeated after admission shows a 4.1 x 4.2 x  4.1 cm complex mass in the liver, it is either necrosis or fluid  collection, this is in the central liver.  There is a similar area at  dome of the liver measuring 1.8 x 1.3 x 1.8 cm.  The gallbladder also  contains small amount of sludge and stone.  Again, there is report in  the [facility] of an outside CT done on February 24, 2008, that demonstrated  sigmoid diverticulitis and a small sigmoid abscess as well as areas on  the liver similar to the ultrasound, I am unable to locate the disc at  this time for review.   IMPRESSION:  1. Reported sigmoid diverticulitis with micro perforation, first      episode.  2. Associated liver abscess x2.  3. Paroxysmal atrial fibrillation, history of complete heart block, on      Coumadin, anticoagulation.  4. Coronary artery disease with non-ST segment elevated myocardial      infarction in February 2009.  5. Chronic obstructive pulmonary disease and ongoing tobacco abuse.  6. Bipolar disorder.   PLAN:  1. Consulted interventional radiology for  aspiration of liver      abscesses and obtain cultures.  We will need INR less than 1.5      before proceeding.  2. Need to look at the CT done prior to admission to determine the      severity of diverticulitis.  Obviously, she has had a micro      perforation and had abscess collection, and seems to be improving      as  the abscess is resolving.  We can probably continue current      medical therapy with antibiotics and continue this after discharge      and follow up      CT in 2 weeks after discharge, plus or minus that she will need      interval sigmoid colectomy if so, if the surgeon determines this is      indicated in the future, she will need to obtain cardiac clearance      beforehand.  3. We would continue empiric antibiotic therapy as doing.  ID is      following.      Allison L. Rennis Harding, N.P.      Stacy [name], M.D.  Electronically Signed    ALE/MEDQ  D:  02/27/2008  T:  02/28/2008  Job:  [id number]   cc:   Stacy [name], M.D.

## 2010-12-20 NOTE — H&P (Signed)
NAME:  Stacy [name], Stacy [name]             ACCOUNT NO.:  [id number]   MEDICAL RECORD NO.:  [id number]          PATIENT TYPE:  INP   LOCATION:  5149                         FACILITY:  [facility]   PHYSICIAN:  [name]. Jarold Motto, MD, Clementeen Graham, FACP, FAGA DATE OF BIRTH:  17-Jul-1941   DATE OF ADMISSION:  02/25/2008  DATE OF DISCHARGE:                              HISTORY & PHYSICAL   REASON FOR ADMISSION:  Diverticulitis with CT showing sigmoid abscess as  well as likely abscesses in the liver.   HISTORY OF PRESENT ILLNESS:  The patient is a 70 year old white female.  She has a history of prior diverticulitis.  Colonoscopy within the last  few years had been unremarkable with the exception of a prominent  vascular polyp.  This was removed and the biopsy showed no adenomatous  changes.  She has had colonoscopy on February 25, 2008.  The patient had had  some bleeding per rectum and this was felt to be from the large polyp  that was subsequently removed.  The patient says that she has been  treated as an outpatient in the past with antibiotics for bouts of  diverticulitis, but they have never been complicated.   The patient had been having fever and chills with temperatures up to 103  degrees for the past 7-10 days.  There was some lower abdominal pain,  and intermittent nausea and limited vomiting.  Generally anorexia.  Dr.  Cleta Alberts, her primary care physician, ordered a CT scan of the abdomen  performed at [facility] on February 25, 2008.  This study showed  diverticulitis with sigmoid abscess and 2 lesions in the liver measuring  about 4 x 5 cm that perhaps represent abscesses.  Dr. Jarold Motto saw the  patient in the office and admitted her directly from the office to [facility].  The patient was not toxic in the office and her vital  signs were stable.   PAST MEDICAL HISTORY:  1. Bipolar disorder.  2. Coronary artery disease.  She is status post non-ST-elevation MI in      February 2009.   She is status post placement of a Liberte stent to      the right coronary artery in February 2009.  3. Hypothyroidism.  4. COPD with ongoing tobacco use.  5. Hyperlipidemia.  6. Paroxysmal atrial fibrillation.  7. Chronic Coumadin therapy because of the paroxysmal atrial      fibrillation.  8. History of complete heart block.  She is status post pacemaker.  9. Status post hysterectomy.  10.Status post right parathyroidectomy.  11.Diverticulosis, diverticulitis.  12.History of colon polyp.   CURRENT MEDICATIONS:  1. Fluoxetine 20 mg daily.  2. Furosemide 40 mg daily.  3. Synthroid 88 mcg daily.  4. Lithium carbonate 300 mg once daily.  5. Metoprolol 25 mg one-half tablet twice daily.  6. Protonix 40 mg once daily.  7. Warfarin 5 mg daily.  8. Lisinopril 10 mg daily.  9. Ativan 1 mg one-half to 1 p.o. daily as needed.   ALLERGIES:  PENICILLIN and CODEINE.  CHANTIX has caused nausea and  vomiting in  the past.  ACE INHIBITOR has caused ACE inhibitor-induced  cough.   FAMILY HISTORY:  Positive for heart disease, hypertension, emphysema in  her parents, and brother with vocal cord nodules that have required  occasional resection, some of her siblings have heart disease.   SOCIAL HISTORY:  The patient is widowed.  She has 2 daughters.  She  smokes and that is 1-pack of cigarettes daily.  She does not use  alcohol.  She drinks caffeine daily, but not to excess.  She does not  use illicit drugs.   REVIEW OF SYSTEMS:  RESPIRATORY:  The patient has chronic dyspnea with  exercise and does not exercise much.  She has no hemoptysis.  No  pleuritic pain.  CARDIOVASCULAR:  No palpitations, no angina, no PND,  and no peripheral edema or claudication.  PSYCHIATRIC:  Does not feel  suicidal or homicidal.  Denies significant anxiety.  NEUROLOGIC:  No  memory loss.  No headaches.  No vision changes.  HEMATOLOGIC:  No  significant bruising or inappropriate bleeding.  ID:  Does have the  night  sweats and recent fevers as described above.  Otherwise review of  systems is negative.   PHYSICAL EXAMINATION:  VITAL SIGNS:  The patient weighs 145 pounds,  pulse is 64, and blood pressure 110/62.  GENERAL:  The patient is a non obese and well-nourished appearing white  female, in no distress.  HEENT:  Sclerae nonicteric.  Conjunctivae is pink.  Extraocular  movements are intact.  Oropharynx is moist and clear.  NECK:  No JVD, no thyromegaly, and no masses.  PULMONARY:  Chest is clear to auscultation and percussion bilaterally.  No cough.  No shortness of breath at rest. CARDIOVASCULAR:  Regular rate  and rhythm.  No murmurs, rubs, or gallops.  ABDOMEN:  Flat, nondistended.  No masses, no hepatosplenomegaly.  There  is some minimal discomfort in the left lower quadrant elicited with deep  palpation.  No rebound or guarding.  Bowel sounds active and normal.  RECTAL:  No masses or tenderness.  The stool is guaiac-negative.  EXTREMITIES:  No cyanosis, clubbing, or edema.  NEUROLOGIC:  She is alert and oriented x3.  She is appropriate.  There  is no tremor.  She is able to walk, sit, and stand without difficulty  and without assistance.  PSYCHIATRIC:  The patient is alert and cooperative.  There is minimal  anxiety.   LABORATORY DATA:  White blood cell count 18.3.  Hemoglobin 12,  hematocrit 35.1, and MCV 91.3.  Platelets 337,000.  PT 29.5, INR 2.6,  and PTT 66.  Sodium 134, potassium 3.1, chloride 100, CO2 29, glucose  130, BUN 12, and creatinine 0.9.  Total bilirubin 0.6, alkaline  phosphatase 92, AST 22, and ALT 27.  Albumin 3.1.  Amylase 28 and lipase  18.  CA19-9 is 15.7, CA-125 6.0, and CEA 3.0.  Lithium level 0.37.  Urinalysis negative for nitrites and trace amount of leukocytes.  White  blood cells 3-6 per high-powered field and rare bacteria per high-  powered field.   IMPRESSION:  1. Diverticulitis.  The patient is symptomatic with fever and some      abdominal pain, and  she possibly has associated liver abscesses in      addition to sigmoid abscess.  The patient requiring hospitalization      for IV antibiotics, IV fluids, and bowel rest.  2. Question liver abscesses.  We will need to have the radiologist  here at [facility] read the disc with the CT images to ascertain      whether they also feel that these might be liver abscesses, in      which case these might require drainage.  3. Bipolar disorder.  4. Hypokalemia.   PLAN:  1. The patient admitted to the hospital for the above-stated reasons.      We will start antibiotics with Cipro and Flagyl, and continue her      outpatient oral medications.  2. Allow full liquid diet.  3. [facility] radiologic physician reading of the DVD and CT      images to ascertain regarding need for drainage of abscesses and      for opinion regarding possible liver abscesses.  4. We will add potassium to the patient's IV fluids.      Jennye Moccasin, PA-C      [name]. Jarold Motto, MD, Caleen Essex, FAGA     SG/MEDQ  D:  02/26/2008  T:  02/27/2008  Job:  [id number]

## 2010-12-20 NOTE — Discharge Summary (Signed)
NAMEALYCEA, SEGOVIANO             ACCOUNT NO.:  [id number]   MEDICAL RECORD NO.:  [id number]          PATIENT TYPE:  INP   LOCATION:  6531                         FACILITY:  [facility]   PHYSICIAN:  Maple Mirza, PA   DATE OF BIRTH:  21-Nov-1940   DATE OF ADMISSION:  09/11/2007  DATE OF DISCHARGE:  09/17/2007                               DISCHARGE SUMMARY   THE PATIENT HAS ALLERGIES TO CODEINE, PENICILLIN; NAUSEA AND VOMITING  WITH CHANTIX.   Time for this dictation greater than 40 minutes.   FINAL DIAGNOSES:  1. Admitted with chest pain.  2. Elevated troponin I studies 1.3 x2.  3. A 2-D echocardiogram September 12, 2007; ejection fraction 60%, no      left ventricular wall motion abnormalities, no pericardial      effusion.  4. Non-ST-segment elevation myocardial infarction.  5. Ongoing tobacco.  6. Left heart catheterization September 16, 2007.  There was an 80%      stenosis in proximal right coronary artery with ulcerated plaque,      Liberte stent placed, 80% to 0 residual; left anterior descending      artery disease 40% after the first diagonal, 30% distally; left      circumflex has no significant coronary artery disease.  The left      ventricular function is normal.  7. The patient discharging on Plavix for 30 days and lifelong enteric-      coated aspirin.   SECONDARY DIAGNOSES:  1. Previous history of takotsubo.  2. History of paroxysmal atrial fibrillation with Coumadin therapy.  3. Pacemaker Medtronic Adapta implant for complete heart block.  4. Hypertension.  5. Hypothyroidism.  6. Bipolar disorder.  7. Hyperlipidemia.  8. Gastroesophageal reflux disease with diverticulosis.  9. Ongoing tobacco habituation with chronic obstructive pulmonary      disease.  10.Stress Myoview study April 2007, ejection fraction 77%, no ischemia  11.Patient with nocturnal snoring.   SURGICAL HISTORY:  Right inferior parathyroidectomy.   PROCEDURE:  1. A 2-D echocardiogram  September 12, 2007; ejection fraction 55-60%, no      left ventricular wall motion abnormalities, no pericardial      effusion.  2. Smoking cessation consult refused, the patient determined to quit,      has nicotine patch  3. Left heart catheterization September 16, 2007 as above, proximal 80%      ulcerated plaque in the right coronary artery status post Liberte      stent.  The patient will discharge on one month of Plavix.   BRIEF HISTORY:  Ms. Ethridge is a 70 year old female.  At about 9:00  February 4, she developed sudden onset of anterior chest indigestion and  chest discomfort.  She had no radiation, but she rated it as a scale of  10 over 10.  It gradually eased off a little bit.  She had a cup of  coffee at the [facility] ,and her discomfort again increased.  She  noticed some slight shortness of breath and nausea and diaphoresis.  She  denied vomiting.   She feels that her symptoms are similar with  those that she experienced  when presenting with a myocardial infarction in May 2008.  At that time,  it was felt be takotsubo because the patient had a catheterization with  nonobstructive coronary artery disease.  This is her first time with  recurrent symptoms.  She denies any stress-related problems.  Her  boyfriend made her come to the emergency room.  She felt fine in the ED.  All in all, her discomfort lasted about one and one half hours.   DECISION:  Atypical chest discomfort with negative point of care markers  and negative EKG.  The patient will be counseled on tobacco cessation,  and she will have serial enzymes to rule out myocardial infarction.  We  will also get a 2-D echocardiogram.   HOSPITAL COURSE:  The patient presents February 4 with chest discomfort,  chest pain, and accompanying diaphoresis and nausea.  A 2-D  echocardiogram showed preserved ejection fraction 55-60%, no left  ventricular wall motion abnormalities.  Troponin I studies were elevated  at  1.3 x2.  The patient had tobacco cessation on February 5.  The  patient said she was unwilling to quit any time soon.  The patient has  been on nicotine patch, however, and is wearing it at the time of  discharge.  The patient was on Coumadin coming into hospital; her INR  was 3.2 on admission.  Her Coumadin was held with the proviso that she  would be started on IV heparin with INR less than 2, and she was planned  for left heart catheterization, Monday, February 9.  This was done  without complication.  The study showed that the patient did have an  ulcerated proximal right coronary artery plaque.  This was treated with  a Liberte stent, reducing the 80% stenosis to 0.  She had residual  disease in the LAD, was nonobstructive 40% after the first diagonal, 30%  distally.  The patient will go home on Plavix, aspirin as well as a  strict orders to stop smoking, also heavy statin therapy.  The patient  does have a known history of dyslipidemia.   DISCHARGE MEDICATIONS:  1. Plavix 75 mg daily for 1 month.  She was given a card for the first      14 tablets and then a prescription for the next 16 tablets.  She      has been counseled that she can take this for a longer period of      time, and the prescription also adjusts for this over one-year      course.  2. Enteric-coated aspirin 81 mg daily lifelong.  The patient is      counseled that both aspirin and Plavix work to keep her stent open.  3. Nicotine patch 14 mg per 24 hours, apply to upper outer arm daily      for 2 weeks, then shift to 7 mg patch for 24 hours for 2 weeks and      then stop.  She was counseled to remove the patch every evening to      avoid vivid dreams.  4. Lipitor 80 mg daily at bedtime, a new medication.  5. Micardis 40 mg daily.  6. Protonix 40 mg daily.  7. Lithium carbonate 300 mg daily.  8. Lasix 40 mg daily.  9. Prozac 20 mg daily.  10.Metoprolol 25 mg twice daily.  11.Synthroid 88 mcg daily.   12.Coumadin 5 mg daily.  13.Darvocet-N 100 as needed.   She  has follow-up appointments at [facility] at the [facility]  office.  1. Will be to see the [facility] February 25 at 9:15 followed by      office visit post catheterization with a physician assistant at      9:45.  She will see Dr. Graciela Husbands on April 13 at 3:15.  Of note,      Coumadin has been restarted after the catheterization February 9.   LABORATORY STUDIES:  Pertinent to this admission, on day of discharge  February 10:  Hemoglobin 14.2, hematocrit 41, white cells 9.9, platelets  of 278.  Serum electrolytes on February 10:  Sodium 133, potassium 3.9,  chloride 101, carbonate 27, BUN 17, creatinine 0.96, glucose 111.  Protime February 10 12.7, INR 0.9.  Alkaline phosphatase this admission  58, SGOT is 23, SGPT is 19.  Troponin I studies were 1.3, 1.3, 0.98,  0.17.  TSH this admission is 1.733.      Maple Mirza, PA     GM/MEDQ  D:  09/17/2007  T:  09/18/2007  Job:  [id number]   cc:   Corwin Levins, MD  Duke Salvia, MD, [facility]  [name]. Jarold Motto, MD, Caleen Essex, FAGA

## 2010-12-20 NOTE — Cardiovascular Report (Signed)
NAME:  Stacy [name], Stacy [name] NO.:  [id number]   MEDICAL RECORD NO.:  [id number]          PATIENT TYPE:  INP   LOCATION:  3701                         FACILITY:  [facility]   PHYSICIAN:  Arturo Morton. Riley Kill, MD, FACCDATE OF BIRTH:  May 31, 1941   DATE OF PROCEDURE:  DATE OF DISCHARGE:                            CARDIAC CATHETERIZATION   INDICATIONS:  Ms. Massett is a 70 year old who has recently been  involved in a traumatic personal incident.  She subsequently came in in  pulmonary edema, and also had positive enzymes and elevated BNP.  There  was marked T-wave inversion in the anterior precordial leads as well as  some inferior T-wave changes.  She was seen by Dr. Graciela Husbands and  subsequently referred for cardiac catheterization.  She has a history of  sick sinus syndrome, and currently is on warfarin.   This has been held and she has been given oral vitamin K.   PROCEDURE:  1. Left heart catheterization  2. Selective coronary arteriography.  3. Selective left ventriculography.   DESCRIPTION OF PROCEDURE:  The patient was brought to the  catheterization laboratory and prepped and draped in the usual fashion.  Through an anterior puncture, the femoral artery was entered.  A 5-  French sheath was placed.  We used a standard left Judkins catheter to  engage the left coronary.  The right coronary was engaged with a no-  torque catheter.  Central aortic and left ventricular pressures were  measured with a pigtail.  Ventriculography was performed in the RAO  projection.  She tolerated procedure well.  We then discussed the case  with her significant other for which she had given [place] permission.  There  were no complications.  She was taken to the holding area in  satisfactory clinical condition.   HEMODYNAMIC DATA:  1. Central aortic pressure 125/62.  2. Left ventricular pressure 115/17.  3. No gradient and pullback across the aortic valve.   ANGIOGRAPHIC DATA:  1.  Ventriculography in the RAO projection reveals some reduction in      overall left ventricular function.  Estimated ejection fraction      would be in the range of 35% to 45%.  There is hypokinesis at the      mid distal anterolateral apical and distal inferior wall,      compatible with what appears to be apical ballooning.  There is      very mild mitral regurgitation noted, but it does not appear to be      significant.  The proximal aortic root on the RAO ventriculogram      looks intact.  2. The left main is free of critical disease.  3. The LAD courses to the apex.  There is some calcification in the      left anterior descending artery, and it is in the mid portion but      does not appear to be critical.  Just proximal to this area is      about 30% to 40% narrowing.  There is a major diagonal branch which      assumes an intermediate position, and  a second diagonal branch of      moderate size, both of which are free of critical disease.  4. The circumflex provides a moderate marginal system with 2 to 3      marginal branches.  Both the circumflex marginals have minimal      luminal irregularity but no critical stenoses.  The ostium of the      circumflex does taper, and this would be estimated in the range of      about 30% to 40%, but does not appear to be high-grade.  5. The right coronary artery is a moderate size vessel.  It provides      posterior descending and posterolateral branches.  There is an      eccentric plaque of about 40% noted in the junction of the proximal      and mid vessel.  More distally there is also a second area of 30%      to 40% narrowing in the in the mid vessel in the distal portion      before the acute margin.   CONCLUSION:  1. Moderate reduction in left ventricular function with apical      ballooning suggestive of Takayasu's.  2. Scattered coronary artery plaques without significant high-grade      obstruction.   DISPOSITION:  The patient  will be likely started back on her warfarin.  Medical therapy is recommended.      Arturo Morton. Riley Kill, MD, [facility]  Electronically Signed     TDS/MEDQ  D:  01/01/2007  T:  01/01/2007  Job:  [id number]   cc:   Duke Salvia, MD, [facility]  Pricilla Riffle, MD, [facility]  Bevelyn Buckles. Bensimhon, MD

## 2010-12-20 NOTE — Consult Note (Signed)
NAME:  Stacy [name], Stacy [name]             ACCOUNT NO.:  [id number]   MEDICAL RECORD NO.:  [id number]          PATIENT TYPE:  INP   LOCATION:  5149                         FACILITY:  [facility]   PHYSICIAN:  Jesse Sans. Wall, MD, FACCDATE OF BIRTH:  1940/08/21   DATE OF CONSULTATION:  DATE OF DISCHARGE:                                 CONSULTATION   REFERRING PHYSICIAN:  John N. Marina Goodell, MD   We were asked by Dr. Yancey Flemings to consult on Stacy [name] about  stopping her Coumadin and also about her chronic cardiovascular issues.   HISTORY OF PRESENT ILLNESS:  Stacy [name] was admitted to [facility] on February 26, 2008, because of abdominal pain.  CT scan showing  diverticulitis with sigmoid abscess, as well as two lesions in her liver  that were felt to be abscesses as well.   She was having fever and chills with temperatures up to 103 for the last  7-10 days.  She has some abdominal pain, intermittent nausea, and  limited vomiting.  She saw Dr. Tonette Lederer, her primary care physician  who ordered CT of the abdomen.  This study showed the above findings.  The patient was admitted by Dr. Eloise Harman from the office.  The patient  has not been toxic.   She has history of paroxysmal atrial fibrillation and is on chronic  Coumadin.  Her INR on admission was therapeutic at 2.6.   She also has a history of coronary artery disease status post non-ST-  segment infarct with a Takotsubo  syndrome in the past as well.  During  her hospitalization in February 2009, she underwent a Liberte stent to  the proximal right coronary artery and had a residual 40% LAD, 30%  distal LAD, and normal LV function.   She has had a hard time affording her Plavix.   She is having no symptoms of angina or ischemic symptoms.  She cannot  remember the last time she was in atrial fib.  She does have symptoms  with this for her history.  She is in sinus rhythm now.   PAST MEDICAL HISTORY:  In addition to above, she has  bipolar disorder.  She has a history of hypothyroidism.  She has a history of COPD and she  continues to smoke.  She has hyperlipidemia.  She has a history of  complete heart block and is status post permanent pacemaker implantation  by Dr. Graciela Husbands.  She has had a hysterectomy in the past, right  parathyroidectomy.  She has got a history of colonic polyps.   Her medications on admission were;  1. Fluoxetine 20 mg a day.  2. Furosemide 40 mg a day.  3. Synthroid 88 mcg a day.  4. Lithium 300 mg once a day.  5. Metoprolol 25 mg 1/2 a tablet twice a day.  6. Protonix 40 mg a day.  7. Warfarin 5 mg a day.  8. Lisinopril 10 mg a day.  9. Ativan 1 mg one half to one p.o. daily as needed.   ALLERGIES:  She is allergic to PENICILLIN and CODEINE.  ZANTAC  has  caused nausea and vomiting in the past.  She had an ACE INHIBITOR-  induced cough.   Her family history is positive for heart disease, hypertension, and  emphysema.   SOCIAL HISTORY:  She is widowed.  She has two daughters.  She smokes  about one pack cigarettes a day.  She does not use alcohol.   REVIEW OF SYSTEMS:  Other than HPI is really negative.  She does have  chronic dyspnea on exertion.  She has had no recent angina,  palpitations, or chest pain.   Her exam today, she is very pleasant.  Her blood pressure 120/69, her  pulse is 73 and regular.  EKG shows sinus rhythm.  Her respiratory rate  is 18, temp is 98.1, sat is 96% on room air.  HEENT is normocephalic,  atraumatic.  PERRLA.  Extraocular movements are intact.  Sclerae are  clear.  Facial symmetry is normal.  Carotids upstrokes were equal  bilaterally without bruits.  No JVD.  Thyroid is not enlarged.  Trachea  is midline.  Lungs have remarkably decreased breath sounds throughout.  Heart reveals a nondisplaced PMI.  Regular rate and rhythm.  Permanent  pacemaker is in place.  Abdominal exam is soft, good bowel sounds.  I  did not do any palpation because of her  abscesses and tenderness.  Extremities show no sinus, clubbing, or edema.  Pulses were present but  reduced.   Her laboratory data is remarkable for a normal BMP.  Her INR on February 27, 2008, was 2.9.  CBC shows a white count of 13.7, hemoglobin 11.2,  platelets 314.  Blood cultures x2 from February 25, 2008, show no growth to  date.  Urine looks negative.  Lithium was 0.37 which is subtherapeutic.  Lipase was normal.  Amylase was normal.   CT scans and ultrasound are noted in the chart.   ASSESSMENT:  1. History of paroxysmal atrial fibrillation which has been very      quiescent by history.  She is on chronic anticoagulation and is      therapeutic on her Coumadin.  2. Coronary artery disease.  She has had a history of Takotsubo      disorder, and has also had a Liberte stent to her right coronary      artery with a non-ST-segment elevated myocardial infarction in      February 2009.  She has good left ventricular function.  3. Other problems as mentioned above.   Recommendation in regard to stopping her Coumadin for possible liver  biopsy or drainage of these abscesses is quite reasonable.  Since she  has not had atrial fib in sometime and she is in normal sinus rhythm,  the risk of thromboembolic event is low.  I have discussed this with the  patient.  She is comfortable with this.  Once she is over the current  procedures and acute illness, I would start her Coumadin back for long-  term prevention of thromboembolic events.   Her coronary artery disease is stable and requires no intervention at  present.  We will be available in case she has any problems during her  hospitalization.  Thank you very much for the notification and  consultation.      Thomas C. Daleen Squibb, MD, [facility]  Electronically Signed     TCW/MEDQ  D:  02/28/2008  T:  02/29/2008  Job:  [id number]   cc:   Duke Salvia, MD, [facility]  Wilhemina Bonito. Marina Goodell,  MD

## 2010-12-20 NOTE — H&P (Signed)
NAME:  Stacy [name], Stacy [name] NO.:  [id number]   MEDICAL RECORD NO.:  [id number]          PATIENT TYPE:  EMS   LOCATION:  MAJO                         FACILITY:  [facility]   PHYSICIAN:  Stacy Rued, PA-C     DATE OF BIRTH:  07/02/1941   DATE OF ADMISSION:  09/11/2007  DATE OF DISCHARGE:                              HISTORY & PHYSICAL   PRIMARY CARE PHYSICIAN:  Stacy Levins, MD   PRIMARY CARDIOLOGIST:  Stacy Salvia, MD, [facility]   GASTROENTEROLOGIST:  Stacy Rea. Jarold Motto, MD, Stacy [name], FAGA   BRIEF HISTORY:  Stacy [name] is a 70 year old white female who was in  her usual state of good health until around 9:00 a.m. this morning when  she developed a sudden onset of anterior chest indigestion-type  feelings.  She also described it as a lump in her chest.  This did not  radiate.  She gave it 10 on a scale of 0/10 and it gradually eased to a  5.  After dropping her daughter off at school, she went to [employer] with her boyfriend.  However, drinking after drinking coffee,  her discomfort again increased.  At this point, she noticed some slight  shortness of breath, some nausea and diaphoresis.  She denied vomiting.  She feels that this is similar to when she presented with her myocardial  infarction May 2008 which was felt to be Takayasu as that she had  nonobstructive coronary artery disease by cath.  This is the first time  she has had any recurring symptoms, but she denies any stress related  problems.  Due to her symptoms, her boyfriend made her come to the  emergency room.  She states that after getting to the emergency room she  felt fine.  So, all in all, her discomfort lasted about 1-1/2 hours.  She does not recall any alleviating or aggravating factors.  She does  recall on Monday evening after eating a heavy meal, she did have water  brash which was new for her.   ALLERGIES:  CODEINE AND PENICILLIN.  SHE ALSO HAS NAUSEA AND VOMITING  WITH CHANTIX.   MEDICATIONS PRIOR TO ADMISSION:  Fluoxetine 20 mg daily, Lasix 40 mg  daily, lithium 300 mg daily, metoprolol 25 daily, Micardis 40 daily,  Protonix 40 daily, Synthroid 88 mcg daily, Coumadin 5 mg daily,  propoxyphene napsylate 100/650 p.r.n.   MEDICAL HISTORY:  1. Hypertension.  2. Hypothyroidism.  3. Bipolar disorder.  4. Hyperlipidemia last checked May 2008, showed total cholesterol 239,      triglycerides 173, HDL 50, LDL 154.  5. GERD with diverticulosis.  6. COPD.  7. History of PAF in complete heart block for which she received a      [employer] Adapta pacer by Dr. Graciela [name].  Coumadin is also regulated at      Dr. Odessa [name] office.  8. She suffered a non-ST elevated myocardial infarction during a      stressful episode of stressful event.  Catheterization on Jan 01, 2007, showed EF 35-40%, 30-40% mid LAD, 30-40% ostial circumflex,  40% proximal mid RCA.  She had apical ballooning concerning for      Takayasu.  Echocardiogram in follow-up on January 18, 2007, showed EF      of 50-60% without wall motion abnormalities, left atrial      enlargement.  9. Last stress Myoview available to [place] on November 16, 2005, showed EF of      77%, no ischemia.  10.The patient also snores.   SURGICAL HISTORY:  Notable for right inferior parathyroidectomy.   SOCIAL HISTORY:  She resides in [place] with her daughter.  She has  two daughters that are alive and well.  She is currently widowed.  She  is retired from [employer].  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  She smoked up to a pack  a day for the preceding 40 years.  Denies any alcohol, drugs, herbal  medications, specific diet.  She does state that she walks approximately  one hour per day.   FAMILY HISTORY:  Both parents are deceased.  Mother at age 62 with some  type of heart problem, history of hypertension.  Father at age 60 with  emphysema.  She has three brothers that are living.  She knows that one  of her brothers has nodules that has to be  removed on a regular basis  from his vocal cords from Agent Orange.  She has one sister deceased in  her late 55s with heart problems.  Another sister that is alive and  well.   REVIEW OF SYSTEMS:  Notable for recent upper respiratory virus about a  month ago.  She still has residual sinus problems.  She has gained  approximately 4 pounds over the past 2 months.  Admits to headaches,  continued sinus congestion and glasses.  She has chronic shortness of  breath with exertion.  However this is not changed.  Chronic cough  productive of yellow sputum.  She is postmenopausal.  She also describes  GERD Symptoms.   PHYSICAL EXAMINATION:  GENERAL:  Well-nourished, well-developed,  pleasant, white female in no apparent distress.  VITAL SIGNS:  Temperature 97.8.  Blood pressure initially 178/93, is now  156/77, pulse 66, respirations 18, 99% sat on room air.  HEENT:  Grossly unremarkable.  NECK:  Supple without thyromegaly, adenopathy, JVD or carotid bruits.  CHEST:  Symmetrical excursion.  Lungs sounds are diminished, but clear  to auscultation.  HEART:  PMI is not displaced.  Regular rate and rhythm.  Normal S1-S2.  We do not appreciate any murmurs, rubs, clicks or gallops.  All pulses  are symmetrical and intact.  Did not appreciate abdominal femoral  bruits.  SKIN:  Integument is intact.  ABDOMEN:  Unremarkable.  EXTREMITIES:  Negative cyanosis, clubbing or edema.  MUSCULOSKELETAL/NEUROLOGICAL:  Unremarkable.   STUDIES:  Chest x-ray shows no active disease and EKG shows normal sinus  rhythm with a first-degree AV block with a PR interval of approximately  312.  Old EKGs are not available for comparison.  She does not have any  acute changes.  I-STAT showed H&H of 15.0 and 44.0, sodium 136,  potassium 38, BUN 14, creatinine 1.4, glucose 104.  Point of care  markers negative times one.  PT was 34.1, INR 3.2.   IMPRESSION:  1. Atypical chest discomfort with initial negative point of  care      marker and EKG  2. Hypertension.  3. Continued tobacco use.  4. Slightly elevated INR.  History as noted per past medical history.  DISPOSITION:  Dr. Dietrich Pates reviewed the patient's history, spoke with  and examined the patient and agrees with the above.  We both counseled  her on tobacco cessation.  We will admit her overnight for observation  to rule out  myocardial infarction.  Her discomfort is very atypical from a cardiac  perspective.  We will have an echocardiogram performed, and this did not  show any new changes compared to an echo on January 18, 2007.  She rules  out myocardial infarction.  She will be discharged home for outpatient  follow-up.  We will check her usual labs.      Stacy Rued, PA-C     EW/MEDQ  D:  09/11/2007  T:  09/12/2007  Job:  [id number]   cc:   Gerrit Friends. Dietrich Pates, MD, [facility]  Stacy Levins, MD  Stacy Salvia, MD, [facility]  Stacy Rea. Jarold Motto, MD, Stacy [name], FAGA

## 2010-12-20 NOTE — Assessment & Plan Note (Signed)
[facility]                            CARDIOLOGY OFFICE NOTE   Stacy [name], Stacy [name]                    MRN:          [id number]  DATE:10/02/2007                            DOB:          Mar 05, 1941    This is a patient of Dr. Berton Mount.  This is a 70 year old white  female patient of Dr. Odessa Fleming who presented to the hospital with non-ST  segment elevation MI and has a previous history of Tako-Tsubo syndrome.  She underwent like her LIBERTE stent to the proximal RCA and has  residual 40% LAD, 33 distal LAD, and normal LV function.  She was  discharged home on Plavix for 30 days and Micardis was added to her  medications but she is having trouble affording this as her insurance is  not paying for it.   Overall, the patient is doing pretty well.  She had no recurrent chest  pain since her hospitalization she is working on smoking cessation and  she has down to three cigarettes a day after meals, but is having  trouble quitting completely.  She is exercising 15 minutes a day and  plans on increasing this.   CURRENT MEDICATIONS:  Fluoxetine 20 mg daily, warfarin as directed,  furosemide 40 mg daily, pantoprazole 40 mg daily, Synthroid 88 mcg  daily, Aricept 10 mg daily, lithium 300 mg daily, metoprolol 25 mg  daily, Micardis 40 mg 1/2 daily, Lipitor 80 mg daily, aspirin 81 mg  daily, Plavix 75 mg daily, multivitamin two daily, iron daily and  calcium with D 2 daily.   PHYSICAL EXAM:  Is a pleasant 70 year old white female in no acute  distress.  Blood pressure 149/84, pulse 85, weight 149.  NECK:  Without JVD, HJR bruit or thyroid enlargement.  LUNGS:  Clear anterior posterior lateral.  HEART:  Regular rate and rhythm at 90 beats per minute normal S1-S2 no  murmur, rub, bruit, thrill or heave noted.  Abdomen is soft without organomegaly, masses, lesions or abnormal  tenderness.  Right groin without hematoma or hemorrhage.  EXTREMITIES:  Without  cyanosis or edema.  She has good distal pulses.   EKG AV paced.   IMPRESSION:  1. Status post non-ST segment elevation myocardial infarction on      September 11, 2007 treated with LIBERTE stent to the RCA with      residual 40% LAD after the first diagonal and 13% distal LAD,      normal LV function.  2. Tobacco abuse trying to quit.  3. Paroxysmal atrial fibrillation.  4. Coumadin therapy.  5. Previous history of Tako-Tsubo syndrome.  6. Medtronic pacemaker for complete heart block.  7. Hypertension.  8. Hypothyroidism.  9. Bipolar disorder.  10.Hyperlipidemia.  11.Gastroesophageal reflux disease.  12.Diverticulosis.  13.Chronic obstructive pulmonary disease.  14.History of right inferior parathyroidectomy.   PLAN:  At this time the patient is doing quite well.  I told her she  really needs to completely stop smoking and the importance of this  because she cannot afford the Micardis, we have no samples of this.  I  am switching her  to Atacand 8 mg 1/2 tablet a day once she is out for  Micardis samples.  She is to keep track of her blood pressures and if  they remain elevated, we can go up to 8 mg on Atacand and she is also  been given 1-800 number for assistance with this drug.  She will see Dr.  Graciela Husbands back in April.      Jacolyn Reedy, PA-C  Electronically Signed      Bevelyn Buckles. Bensimhon, MD  Electronically Signed   ML/MedQ  DD: 10/02/2007  DT: 10/02/2007  Job #: [id number]

## 2010-12-20 NOTE — Discharge Summary (Signed)
NAMESHONDELL, FABEL             ACCOUNT NO.:  [id number]   MEDICAL RECORD NO.:  [id number]          PATIENT TYPE:  INP   LOCATION:  6531                         FACILITY:  [facility]   PHYSICIAN:  Gerrit Friends. Dietrich Pates, MD, FACCDATE OF BIRTH:  October 04, 1940   DATE OF ADMISSION:  09/11/2007  DATE OF DISCHARGE:  09/17/2007                               DISCHARGE SUMMARY   ADDENDUM:  This is concerning one of her medications, Micardis, which she takes at  40 mg per day.  She has been getting it here during her hospitalization  from February 4 to February 10.  She had non-ST-elevation myocardial  infarction with right coronary artery ulcerated plaque which was treated  with a stent.  The patient did come in with elevated blood pressure, the  point being that the patient has an intolerance to the ACE INHIBITORS.  She had quite a persistent cough, and she was put on Micardis as an  alternative. I was looking through the generic drug file, and there are  no ARBs which are generic at the present time. She says that her  insurance company is currently refusing to cover Micardis, and she finds  it to be prohibitive in cost.  I told her that she should try to stumble  along and take it until she sees Dr. Graciela Husbands in followup April 13. Maybe  he can have a suggestion.  My thought would be clonidine, but she was  told that she needed ACE or ARB inhibition for sure.      Maple Mirza, PA      Gerrit Friends. Dietrich Pates, MD, [facility]  Electronically Signed    GM/MEDQ  D:  09/17/2007  T:  09/18/2007  Job:  [id number]   cc:   Duke Salvia, MD, [facility]  Corwin Levins, MD  [name]. Jarold Motto, MD, [facility], FAGA

## 2010-12-20 NOTE — Discharge Summary (Signed)
NAME:  Stacy [name], Stacy [name]             ACCOUNT NO.:  [id number]   MEDICAL RECORD NO.:  [id number]          PATIENT TYPE:  INP   LOCATION:  5149                         FACILITY:  [facility]   PHYSICIAN:  Hedwig Morton. Juanda Chance, MD     DATE OF BIRTH:  05-Feb-1941   DATE OF ADMISSION:  02/25/2008  DATE OF DISCHARGE:  03/06/2008                               DISCHARGE SUMMARY   ADMITTING DIAGNOSES:  1. CT scan performed at [facility] radiology showing abscess in the      sigmoid colon and likely abscesses of the liver.  2. History of colon polyps on colonoscopy of February 25, 2008.  3. History of bleeding per rectum attributed to large polyp, which was      subsequently removed with colonoscopy.  4. Prior bouts of diverticulitis treated on an outpatient basis with      antibiotics.  5. Fever and chills with temps measuring up to 103 degrees for the      past 10 days associated with mild lower abdominal pain.  These were      now attributed to the findings on recent CT scan.  6. Bipolar disorder, well managed with current medications.  7. Coronary artery disease.  Status post non-ST elevation myocardial      infarction and placement of a bare metal stent in February 2009.  8. Hypothyroidism.  9. Chronic obstructive pulmonary disease with ongoing tobacco use.  10.Hyperlipidemia.  11.Paroxysmal atrial fibrillation requiring chronic Coumadin therapy.  12.History of complete heart block.  She is status post pacemaker      placement.  13.Status post hysterectomy.  14.Status post right parathyroidectomy.   DISCHARGE DIAGNOSES:  1. Liver abscess was treated with percutaneous drainage and IV      antibiotics.  The abscesses not delineated but may have been      secondary to now resolved sigmoid diverticulitis.  2. Questionable sigmoid diverticulitis.  On CT scan performed here at      [facility], there was no evidence for any sigmoid      diverticulitis or sigmoid abscess.  3. Atrial fibrillation with  chronic Coumadin.  Coumadin has been held      throughout this hospitalization to allow for interventions.  4. Loose stool.  She ruled out for C. diff.  This is probably a side      effect of her antibiotics.  5. Hypothyroidism.  Recently had full workup of her thyroid tests by      Dr. Fransico Michael and labs were acceptable.   Consultations with Cardiology, with General Surgery, with Infectious  disease, with Interventional Radiology.   PROCEDURES:  Percutaneous drain placement to the liver abscess by  Interventional Radiology on July 27 by Dr. Irish Lack.  The drain  was pulled on March 06, 2008.   LABORATORY DATA:  Initial white blood cell count was 18.3.  It was 10.6  at discharge.  Hemoglobin 12.4, hematocrit 37.2, MCV 92.3.  Platelet  count 307.  PT initially 29.5.  It was 13.3 at discharge.  INR 2.6  initially, 1.0 at discharge.  PTT 66.  It was not  rechecked.  Total  bilirubin 0.5, alkaline phosphatase 88.  AST 20, ALT 88.  Albumin 2.9.  Amylase 28, lipase 18.  CA-125 6.0, CA19-9 15.7.  CEA 3.0.  Lithium  level 0.37.  Urinalysis negative.  Abscess culture from the liver showed  no organisms, abundant white blood cells both PMNs and mononuclear  cells.  It did not grow anything on final report.  Stool culture,  negative for Salmonella, Shigella, Campylobacter or Yersinia.  C. diff  toxin negative twice.  Blood cultures negative.  Fecal lactoferrin was  positive.   Acute abdominal series on July 21 showed hyperaeration of the lungs, no  active infiltrates, and no obstruction or free air in the abdomen.  Ultrasound on July 22 showed two hypoechoic lesions in the liver.  Differential diagnosis includes abscess versus necrotic tumor.  Thin  layer of biliary sludge or small nonshadowing gallstones noted.  CT scan  on July 24 showed lesions in the caudate and right hepatic lobe most  consistent with abscesses.  Largest of these measured 2.6 x 5 x 2.8 cm.  CT scan of July 30 showed  essentially near complete resolution of  hepatic abscesses by imaging and no further significant abscesses seen  in the distal aspect of the drain catheter.  Small areas of low  attenuation remaining near the course of the catheter representing  either small pockets of undrained fluid or residual edema.  These were  too small to warrant additional percutaneous intervention.  No new  abscess seen.   HOSPITAL COURSE:  1. Liver abscess.  The patient had the larger abscess drained.  Within      6 days of repeat CT scan showed that the abscess was nearly      completely resolved.  The percutaneous drain was pulled on July 31.      The patient had some discomfort associated with the drain but      overall tolerated the presence of the drain well.  The patient did      not have significant abdominal pain either before she came into the      hospital or during hospitalization.   Infectious Disease helped manage the patient's antibiotics.  Antibiotics  used were IV Cipro and Flagyl.  She is to go home on a 2 weeks further  course of oral Cipro and Flagyl as to complete antibiotic treatment of  the abscess.  1. Diarrhea.  Once antibiotics were started, the patient almost      immediately started having diarrhea.  She was worked up for C.      Diff, which was negative.  Stool cultures were negative.  She was      treated with Florastor during the hospitalization and diarrhea      actually resolved once her diet was advanced to solid foods.  2. Bipolar disorder.  The patient had no incidences of mania during      this hospitalization.  She was always pleasant, agreeable, and      willing to go with the program.  3. Chronic Coumadin for treatment of history of paroxysmal atrial      fibrillation.  The patient's Coumadin was held throughout the      hospitalization as to allow for percutaneous intervention.      Coumadin was restarted on July 30.  The INR is not nearly      therapeutic at this  point, but she will go home without any Lovenox  or subcu heparin coverage.  She is aware that there is a small risk      of stroke and if she has any neurologic symptoms such as difficulty      with speech, vision, or just does not feel right mentally she      should return to the emergency room.  The patient's pulse has been      consistently regular, thus during exam and on repeated EKGs.   CONDITION ON DISCHARGE:  Stable.   DISCHARGE DIET:  Heart healthy.   MEDICATIONS:  At discharge:  1. Fluoxetine 20 mg once daily.  2. Furosemide 40 mg once daily.  3. Lithium 300 mg once daily.  4. Metoprolol 25 mg one-half p.o. twice daily.  5. Protonix 40 mg once daily.  6. Warfarin 2.5 mg twice daily.  7. Lisinopril 10 mg once daily.  8. Ativan 1 mg one-half to 1 tablet daily as needed for her nurse.  9. Lipitor 80 mg once daily.  10.Levofloxacin 88 mcg once daily.  11.Ciprofloxacin 500 mg twice daily.  12.Metronidazole 500 mg three times daily.  13.Calcium with D twice daily.  14.Centrum Silver twice daily.  15.Iron twice daily.   FOLLOW UP:  1. With Dr. Gerrit Friends, she is to call the surgical office and be seen      within the next 2-4 weeks.  2. With Dr. Sheryn Bison.  Plans were going to be made for an      appointment to be seen by him within the next 2-3 weeks, and we      will call the patient's home with that appointment.  3. Coumadin Clinic.  She has been in touch with the Coumadin clinic      and will be calling there on Monday to make an appointment to have      her INR checked by Wednesday or Thursday.  Note that, she went home      on a dose of Coumadin 2.5 mg daily.  Her previous dose of Coumadin      was 5 mg on day one, 2.5 mg on day two and three and this      alternates in a sequential cycle at those doses.  But because she      was going home on a couple more weeks of Cipro, it was feared that      her INR might climb too much and we were conservative with the       Coumadin dosing in this patient for the time being.      Stacy Moccasin, PA-C      Hedwig Morton. Juanda Chance, MD     SG/MEDQ  D:  03/06/2008  T:  03/07/2008  Job:  [id number]   cc:   [name]. Jarold Motto, MD, Caleen Essex, FAGA  Rise Mu, M.D.

## 2010-12-20 NOTE — Cardiovascular Report (Signed)
NAMESALENA, ORTLIEB NO.:  [id number]   MEDICAL RECORD NO.:  [id number]          PATIENT TYPE:  INP   LOCATION:  2807                         FACILITY:  [facility]   PHYSICIAN:  Veverly Fells. Excell Seltzer, MD  DATE OF BIRTH:  1940-12-10   DATE OF PROCEDURE:  09/16/2007  DATE OF DISCHARGE:                            CARDIAC CATHETERIZATION   PROCEDURE:  Left heart catheterization, selective coronary angiogram,  left ventricular angiogram, percutaneous transluminal coronary  angioplasty and stenting of the right proximal right coronary artery,  Angio-Seal of the right femoral artery.   INDICATIONS:  Ms. Asfour is a 70 year old woman who presented with an  acute coronary syndrome.  She had severe substernal chest pain and  ultimately ruled in for an MI with positive troponins and CK-MBs.  She  had a similar presentation just last year and was diagnosed with  takatsubo cardiomyopathy at that point.  Her angiogram demonstrated  coronary plaque but no significant stenoses.  Her chest pain felt  different at this presentation.   Risks and indications of the procedure were reviewed with the patient.  Informed consent was obtained.  The right groin was prepped, draped, and  anesthetized with 1% lidocaine.  Using the modified Seldinger technique,  a 6-French sheath was placed in right femoral artery.  Standard 6-French  Judkins catheters were used for coronary angiography.  An angled pigtail  catheter was used for left ventriculography.   At the completion of the diagnostic procedure, I elected to intervene on  the right coronary artery.  There was a new ulcerated plaque in the  proximal vessel.  This created at least an 80% stenosis in that region,  and there was hypodensity suggestive of thrombus and acute plaque  rupture.  Angiomax was used for anticoagulation.  The patient was  preloaded with 600 mg of clopidogrel and 4 baby aspirin on the table.  Once a therapeutic ACT was  achieved, a 6-French JR-4 side-hole guide was  placed.  A cougar guidewire was used to wire the lesion, and the wire  was placed in the distal right coronary artery.  The lesion was then  predilated with a 2.5 x 10-mm FireStar balloon up to 8 atmospheres.  I  then elected to stent the vessel with a 3.0 x 12-mm Liberte stent which  was deployed at 16 atmospheres.  The stent was well expanded.  The  patient tolerated the stent placed well.  There was TIMI-3 flow in the  vessel after stenting.  The stent was then postdilated with a 3.25 x 10-  mm DuraStar balloon up to 16 atmospheres on 2 inflations.  At completion  of the procedure, there was a good angiographic result with TIMI-3 flow  and a widely expanded stent.  There are diffuse nonobstructive stenoses  throughout the right coronary artery with significant plaque burden, but  there were no other areas of severe stenosis present.  At completion of  the procedure, a 6-French Angio-Seal device was used to  close the  femoral arteriotomy.   FINDINGS:  Aortic pressure 122/53 with a mean of 81, left ventricular  pressure  117/4.   The left mainstem is relatively small, but there are no significant  stenoses.  There is diffuse plaque throughout.  The left main bifurcates  into the LAD and left circumflex.   The LAD is a moderate caliber vessel that courses down and reaches the  LV apex.  The LAD supplies a large first diagonal branch that has minor  plaque just after the first septal perforator.  There is a 30-40%  stenosis in the LAD.  There is a smaller second diagonal branch, and  then there are no further significant stenoses in the LAD.   The left circumflex is relatively small.  It supplies 2 small OM  branches.  There are no significant stenoses throughout the left  circumflex.   The right coronary artery has diffuse disease.  There is an ulcerated  plaque in the proximal vessel with 80% stenosis and hypodensity.  The  stenosis  is very eccentric.  Again, this is new in comparison to her  angiogram last year.  The distal vessel has tandem 50% stenoses.  The  vessel terminates in a PDA branch, and a large posterolateral branch.  Distally, there are no significant stenoses.   Left ventriculography:  Left ventricular function is normal.  The LVEF  is 60-65%.  There is no significant mitral regurgitation.   ASSESSMENT:  1. Severe RCA stenosis (ulcerated plaque) with successful PCI using a      bare-metal stent.  2. Nonobstructive left anterior descending artery stenosis.  3. Normal left circumflex.  4. Normal left ventricular function.   Ms. Hakim was successfully treated with a focal bare-metal stent to  treat the area of severe stenosis in the proximal right coronary artery.  Recommend aspirin and Plavix for 1 month.  Since the patient requires  Coumadin, her aspirin dose will be 81 mg.  Following 30 days of Plavix,  she could continue with aspirin and Coumadin lifelong at the discretion  of Dr. Graciela Husbands.  She will require continued aggressive lipid lowering and  smoking cessation.      Veverly Fells. Excell Seltzer, MD  Electronically Signed     MDC/MEDQ  D:  09/16/2007  T:  09/17/2007  Job:  [id number]   cc:   Corwin Levins, MD  Duke Salvia, MD, [facility]  [name]. Jarold Motto, MD, [facility] Essex, FAGA

## 2010-12-20 NOTE — Assessment & Plan Note (Signed)
[facility]                                 ON-CALL NOTE   [name], [name]                    MRN:          [id number]  DATE:03/08/2008                            DOB:          March 14, 1941    [name] called to say that she was having nausea.  She was  discharged 3 days ago from the hospital after undergoing a percutaneous  drainage of a liver abscess.  She is on Cipro and Flagyl.  She has had a  total of 10 days of antibiotics.   I explained that the nausea is probably related to the Flagyl and that  she can discontinue this at this point.  If she has further problems,  she will call the office in the next couple of days.     Barbette Hair. Arlyce Dice, MD,FACG  Electronically Signed    RDK/MedQ  DD: 03/08/2008  DT: 03/09/2008  Job #: [id number]   cc:   [name]. Jarold Motto, MD, [facility] Essex, FAGA
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
## 2010-12-23 NOTE — Letter (Signed)
January 29, 2008    Dr. Derrill Kay  [facility]  [place]   RE:  GILMA, BESSETTE  MRN:  [id number]  /  DOB:  1941-07-27   Dear Dr. Derrill Kay:   If I can be of any assistance in trying to plan Mrs. Friedmann's  procedure, please do not hesitate to contact me.  I unfortunately do not  have any experience with the agent that is to be injected, but certainly  collaboration with my Cardiology colleagues can be obtained, if  necessary.      Sincerely,      Duke Salvia, MD, [facility]  Electronically Signed    SCK/MedQ  DD: 01/29/2008  DT: 01/30/2008  Job #: [id number]

## 2010-12-23 NOTE — Discharge Summary (Signed)
[place]. [facility]  Patient:    Stacy [name], Stacy [name]                    MRN: [id number] Adm. Date:  [id number] Disc. Date: 12/27/99 Attending:  Nathen May Dictator:   Leonides Cave, P.A. CC:         Nathen May, M.D., [facility]             Cuney. Charmayne Sheer., M.D.                           Discharge Summary  DATE OF BIRTH:  Oct 09, 1940  DISCHARGE DIAGNOSIS:  High-degree heart block, status post permanent pacemaker implantation by Dr. Berton Mount on Dec 26, 1999.  BRIEF HISTORY:  The patient is a 70 year old female with history of cardiac conduction defect and cardiac sarcoid.  She has had some problems recently with Wenckebach.  She recently had a treadmill test to assess chronotropic incompetence on Dec 12, 1999.  The treadmill report revealed modest chronotropic incompetence.  She was then ordered on a Holter monitor and this was reviewed with Dr. Berton Mount.  After review of the Holter monitor, it was felt that the patient needed a permanent pacemaker implantation.  She was admitted to [facility] on Dec 26, 1999 for this procedure.  HOSPITAL COURSE:  The patient was take to the EP lab on Dec 26, 1999 by Dr. Berton Mount.  A Medtronic pacemaker was placed without any periprocedural complications.  The patient tolerated the procedure very well.  On the following day, the pacemaker site was checked and it was felt that the patient could be discharged home in stable condition.  DISCHARGE MEDICATIONS:  The patient was discharged home on the same medications she came in on, which included: 1. Lithium 450 b.i.d. 2. Premarin 0.625 q.d. 3. Prozac 20 q.d. 4. Lasix 80 q.d. 5. Levothroid 0.125 q.d. 6. Protonix 40 q.d. 7. Tranxene 7.5 1/2 b.i.d. 8. Diovan 80 q.d. 9. KCl 20 q.d.  DISCHARGE INSTRUCTIONS:  The patient was given thorough instruction on no overhead activity with her left upper extremity for two weeks.  She was  also told to continue a low fat/low cholesterol diet and to keep her wound site clean and dry for seven days.  FOLLOW-UP:  She will follow up with the [facility] in 7-10 days for a wound check.  She will follow up with Dr. Graciela Husbands in three months for a pacemaker check. DD:  12/27/99 TD:  12/27/99 Job: [id number]

## 2010-12-23 NOTE — Discharge Summary (Signed)
NAME:  JAMIE, HAFFORD NO.:  [id number]   MEDICAL RECORD NO.:  [id number]          PATIENT TYPE:  OIB   LOCATION:  NA                           FACILITY:  [facility]   PHYSICIAN:  Maple Mirza, PA   DATE OF BIRTH:  03/24/1941   DATE OF ADMISSION:  07/05/2006  DATE OF DISCHARGE:  07/05/2006                               DISCHARGE SUMMARY   This patient is a 70 year old female who presented to [facility] today because her pacemaker is an elective replacement  indicator.   PRINCIPLE DIAGNOSES:  1. Medtronic KAPPA dual-chamber pacemaker implanted May 2001 for      complete heart block/tachybrady syndrome.  2. Patient discharging status post explantation of existing pacemaker,      with implantation of Medtronic ADAPTA dual-chamber pacemaker.  The      patient has had no post-procedural complications, discharging      November 29th.  The patient's device has been interrogated.  The      values are within normal limits.   SECONDARY DIAGNOSIS:  The patient has bipolar disorder.   BRIEF HISTORY:  The patient has:  1. History of paroxysmal atrial fibrillation on chronic Coumadin.  2. Hypertension.  3. Aforementioned tachybrady syndrome.  4. Right inferior para-thyroidectomy August 2007.  5. Myoview study April 2007, ejection fraction 77%.  No ischemia or      infarct.   BRIEF HISTORY:  Ms. Schuff is a 70 year old female with a history of  past pacemaker implantation 2001.  She has been relaying pacemaker  information by way of __________  Cardiac Services, by way of a  telephonic transport of information.  On the patient's telephonic  transport on November 27th, the receiving station noted that the  pacemaker was at end of life.  The patient has been alerted to come in  on November 29 for generator change.   HOSPITAL COURSE:  The patient admitted to [facility] through  short-stay November 29th.  She had successful generator change with  implantation of Medtronic ADAPTA dual-chamber pacemaker.  The leads are  in good condition and has stayed in place.  She discharged the same day.   MEDICATIONS:  She goes home on the following medication:  1. Coumadin as directed by the Coumadin clinic.  2. Levothroid 75 mcg daily.  3. Protonix 40 mg daily.  4. Aricept 10 mg daily.  5. Prozac 20 mg daily.  6. Lithium carbonate 300 mg daily.  7. Lisinopril 5 mg daily.  8. Caltrate vitamin D, one daily.  9. Iron, one daily.   The patient is asked to keep her incision dry for the next 7 days and to  sponge bathe until Thursday, December 6.  She is asked to remove her  bandage Friday, November 30th and leave the incision open to the air.  She has follow-up with [facility], [address]  December 19th at 9 o'clock.      Maple Mirza, PA     GM/MEDQ  D:  07/05/2006  T:  07/06/2006  Job:  [id number]   cc:  Corwin Levins, MD  Duke Salvia, MD, [facility]

## 2010-12-23 NOTE — Assessment & Plan Note (Signed)
[facility]                         ELECTROPHYSIOLOGY OFFICE NOTE   Stacy [name], Stacy [name]                    MRN:          [id number]  DATE:07/27/2006                            DOB:          23-Jan-1941    Stacy [name] was seen today in clinic on July 27, 2006, for followup  wound check of her new implanted Medtronic model No. [id number] Adapta.  Date of implant was July 06, 2006 for complete heart block.  On  interrogation of her device today, her battery voltage is 2.77.  P-waves  measured 4.0 to 5.6 millivolts  with an atrial capture threshold of 0.75  volts at 0.4 milliseconds and an atrial lead impedence of 554.  R-waves  measured 15.68 to 22.40 with a ventricular capture threshold of 1 volt  at 0.76 milliseconds, with a ventricular lead impedence of 942.  There  was one mode switch episode noted.  No changes were made in her  parameters.  These are chronic leads so her thresholds were already  turned down and her Steri-Strips removed today.  Her wound was without  redness or edema and she will be seen again in 3 months' time.      Altha Harm, LPN  Electronically Signed      Duke Salvia, MD, [facility]  Electronically Signed   PO/MedQ  DD: 07/27/2006  DT: 07/27/2006  Job #: [id number]
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
## 2010-12-23 NOTE — Consult Note (Signed)
NAME:  Stacy, [name] NO.:  [id number]   MEDICAL RECORD NO.:  [id number]                   PATIENT TYPE:  OIB   LOCATION:  2899                                 FACILITY:  [facility]   PHYSICIAN:  Melvyn Novas, M.D.               DATE OF BIRTH:  1941-05-21   DATE OF CONSULTATION:  04/04/2002  DATE OF DISCHARGE:                                   CONSULTATION   The consult was called in by the [facility] physician assistant to  the Stroke Consult team.   REASON FOR CONSULTATION:  Blurred vision?   FINDINGS:  This is a 70 year old Caucasian right-handed female admitted to  the short stay unit for a cardioversion/ablation therapy for intermittent  atrial fibrillation/atrial flutter.  The procedure did not succeed today.  The patient remained in an irregular heartbeat.  After waking up from the  anesthesia, the patient had complained about blurred vision.  This was more  than 3 1/2 hours after the patient's procedure had begun.  She had very  unspecifically complained that her eyes did not feel right, that her vision  was blurred and that she felt a little more drowsy than usual.  The patient  had left from the PACU at 12:45.  At 15:30,  the R.N. was notified about the  patient's vision impairment.  At approximately 4:00, we received a neurology  consultation.  The patient saturated 90% O2 on room air when she was in the  unit here waiting for her neurology consult.  A CT of the head was  immediately ordered then the consult was called in, results not yet  available.   PAST MEDICAL HISTORY:  Patient has an extensive past medical history  including COPD, cardiac arrhythmia, gastroesophageal reflux disease,  hysterectomy, pacemaker placement.  She is followed by Dr. Philipp Deputy at  [facility].  She also has recurrent sinus infections and  allergies.   SOCIAL HISTORY:  The patient is a heavy smoker two packs per day for over 40  years.  She  denies chronic alcohol use.  She is 70 years old.  Accordingly  to the chart, unemployed and uninsured.   FAMILY HISTORY:  Not noted in the chart.   REVIEW OF SYSTEMS:  The patient complains of shortness of breath, reflux and  heartburn problems and occasional fatigability.  She now complains mostly of  blurred vision since waking up from her procedure.  The chart mentions that  the patient also has depressive symptoms and has a questionable diagnosis of  bipolar disorder.   CURRENT MEDICATIONS:  1. Lithium 450 mg 1 1/2 tablets per day.  2. Levothroid 0.125 mcg once a day.  3. Prozac 20 mg once a day.  4. Furosemide 80 mg a day.  5. Avapro 450 mg one-half a tablet every day.  6. Clorazepate once a day.  7. Aspirin 325 mg once a day.  8. Protonix  40 mg once a day.  9. Cipro, unknown.  10.      K-Dur 20 mEq once a day.   ALLERGIES:  The patient is listed as allergic to PENICILLIN and CODEINE.   PHYSICAL EXAMINATION:  GENERAL:  Alert and oriented pleasant Caucasian right-  handed female, 98 degrees Farenheit temperature, heart rate 82, respiratory  rate 16, blood pressure between 120-130/70.  Height 5 feet 6 inches.  Weight  150 pounds.  LUNGS:  Clear to auscultation.  The patient has no acute pain, distress or  pressure on her chest.  CARDIAC:  Regular rate, no murmur.  ABDOMEN:  Abdomen is soft, nontender.  EXTREMITIES:  No peripheral edema is noted.  Peripheral pulses are palpable.  SKIN:  Neither hypo- or hyperpigmented.  No lesions.  NEUROLOGIC:  Alert and oriented, compliant with normal attention.  Recent  and remote memory seems intact.  Cranial nerves II-XII intact.  HEENT:  Pupils equal, round and reactive to light and accomodation.  The  patient had cataract surgery on the left which makes her left eye more  sluggish to light but she has equal pupillary size.  She has no papilledema  on fundoscopy.  Her extraocular movements appear intact.  She has _________   peripheral vision and she has decreased acuity when reading with glasses.  The patient is not able to read medium size print from the milk carton and  states this is not normal for her.  She has no loss of facial sensation.  There is decreased labial fold in her left face.  The left eye appears wider  open than the right.  Tongue and uvula, however, are midline.  The neck is  supple.  Question of carotid bruit on the left.  MOTOR:  Bilateral 5/5 motor strength.  No pronator drift.  Equal tone and  mass.  SENSORY:  Extremities equal and sensory to temperature, vibration, touch.  I  did not perform pin prick.  Coordination and finger-to-nose test:  the  patient has significant dysmetria and ataxia on the left.  She does not have  baseline tremor.  Her gait is stable as she walks to the bathroom.   ASSESSMENT:  The patient has multiple risk factors for vascular disease and  continues nicotine abuse, and blood pressure problems in her past.  Pacemaker placement made it impossible to obtain MRI so will need a CT scan,  which the patient has still not undergone at this point.  The patient was  admitted for the ablation procedure and was taken off her anticoagulants.  She will need tonight, after the CT hopefully verifies ischemic or no  visible event, to start on IV heparin by pharmacy protocol without bolus.  I  suspect that this is most likely a lesion in the right hemispheric basal  ganglia or its cerebellar connection.  The cranial nerve VII is the only  involved motor finding.  Otherwise I see only ataxia.  Definitely no sensory  loss, which makes this more likely a lacunar size infarct.  I recommended  for the patient _______________ that she should have received the CT by now  and it is pending in 2 1/2 hours and hopefully will be obtained in a fast  fashion now.  The patient will need to be monitored with neuro checks every  4 hours.  Rehabilitation PT, OT and ST have to be involved.  In  addition, I asked the patient to whistle which she states she can do normally  very well  and she could not do that.  It would be helpful to have a speech therapist  advisor evaluate her before her next meal.  I will try to contact Dr. Graciela Husbands  and, if not possible, the [facility] physician on call.   I thank you sincerely for the consultation and hope that all of the stroke  team will follow the patient over the weekend.  Transthoracic echocardiogram  or Carotid Doppler could be helpful depending on the size and development of  infarct and I will leave this up to the discretion of the cardiologist who  performs tests in their own laboratory over the weekend.                                                Melvyn Novas, M.D.    CD/MEDQ  D:  04/04/2002  T:  04/05/2002  Job:  [id number]

## 2010-12-23 NOTE — Discharge Summary (Signed)
   NAME:  Stacy [name], Stacy [name] NO.:  [id number]   MEDICAL RECORD NO.:  [id number]                   PATIENT TYPE:  INP   LOCATION:  3741                                 FACILITY:  [facility]   PHYSICIAN:  Duke Salvia, M.D. [facility]           DATE OF BIRTH:  02-11-1941   DATE OF ADMISSION:  04/04/2002  DATE OF DISCHARGE:                                 DISCHARGE SUMMARY   ADDENDUM:  After speaking with Dr. Graciela Husbands and Dr. Vickey Huger, it was decided  that the patient was not allowed to drive for two weeks until she was seen  in Dr. Oliva Bustard office for visual field and reaction time testing for her  driving evaluation.  Her appointment was switched from October 3 at 1:30  p.m. to September 18 at 9 a.m.  The patient was called at home and I spoke  to her at length with the above decision regarding her driving.  The patient  stated that she understood.     Chinita Pester, C.R.N.P. [facility]                 Duke Salvia, M.D. [facility]    DS/MEDQ  D:  04/11/2002  T:  04/14/2002  Job:  [id number]

## 2010-12-23 NOTE — Assessment & Plan Note (Signed)
[facility]                         ELECTROPHYSIOLOGY OFFICE NOTE   [name], [name]                    MRN:          [id number]  DATE:10/31/2006                            DOB:          1941-05-23    Stacy [name] is seen.  She is status post pacer implantation for  tachy-brady syndrome.  She is doing really very well.  She had a  complete heart block, but has some intrinsic rhythm at this point.   She continues to smoke and she is interested in discontinuation of this.   On examination today, her blood pressure was 151/88, her pulse was 87.  This is an anomaly, compared to her last blood pressure of 118/70.  Lungs were clear.  Heart sounds were regular.  Extremities were without  edema.   Interrogation of her Medtronic Adapta pulse generator demonstrates a P-  wave of 4 with impedance of 563, a threshold of 0.75 at 0.4, R-wave of  15.7 with impedance of 995, a threshold of 1 volt at 0.4.  Device was  reprogrammed.   IMPRESSION:  1. Tachy-brady syndrome.  2. Atrial fibrillation.  3. Coumadin for the above.  4. Cigarette-smoking with desire to discontinue.   I have encouraged her to follow up with Dr. Jonny Ruiz about Chantix.  We will  see her again in one year's time and made no other medication changes at  the present time.     Duke Salvia, MD, [facility]  Electronically Signed    SCK/MedQ  DD: 10/31/2006  DT: 10/31/2006  Job #: [id number]   cc:   Corwin Levins, MD

## 2010-12-23 NOTE — Assessment & Plan Note (Signed)
[facility]                           GASTROENTEROLOGY OFFICE NOTE   Stacy [name], Stacy [name]                    MRN:          [id number]  DATE:04/26/2006                            DOB:          1940/09/09    [name] returns today for followup from her colonoscopy and removal of a  large vascular polyp which surprisingly was a hyperplastic polyp.  She also  had diverticulosis coli and continues to complain of some chronic  constipation.  She has chronic acid reflux for which she takes daily  Protonix, and really to me today denies dysphagia or odynophagia.  Her vital  signs are all stable and her weight is stable.  Examination of her  oropharynx did not show any evidence of thrush or other abnormalities.   I have added daily Benefiber to her regime with liberal p.o. fluids.  She is  to call again if she has recurrent dysphagia or odynophagia and we will  proceed with endoscopy, last done in 2000.                                   [name]. Jarold Motto, MD, [facility], [place]   DRP/MedQ  DD:  04/26/2006  DT:  04/27/2006  Job #:  [id number]   cc:   Corwin Levins, MD

## 2010-12-23 NOTE — Op Note (Signed)
[place]. [facility]  Patient:    Stacy [name], Stacy [name]                    MRN: [id number] Proc. Date: 07/11/00 Adm. Date:  [id number] Disc. Date: [id number] Attending:  Mick Sell                           Operative Report  PREOPERATIVE DIAGNOSIS:  Cataract, left eye.  POSTOPERATIVE DIAGNOSIS:  Cataract, left eye.  OPERATION PERFORMED:  Cataract extraction with intraocular lens implant, left eye.  SURGEON:  Chucky May, M.D.  INDICATIONS FOR SURGERY:  The patient is a 70 year old female with a posterior subcapsular cataract interfering with her ability to drive.  On examination the patient was found to have a dense nuclear sclerotic and posterior subcapsular cataract consistent with the decrease in visual acuity.  DESCRIPTION OF PROCEDURE:  The patient was brought to the main operating room and placed in supine position.  Anesthesia was obtained by means of topical 4% lidocaine drops with tetracaine.  The patient was then prepped and draped in the usual manner.  A lid speculum was inserted and the cornea was entered with a diamond keratome superiorly with an additional port superior temporally. OcuCoat was instilled and an anterior capsulorrhexis was performed without difficulty.  The nucleus was mobilized by hydrodissection, phacoemulsified, and residual cortical material removed by irrigation and aspiration.  The posterior capsule was polished and a posterior chamber lens implant was placed in the bag without difficulty.  OcuCoat was removed and replaced with balanced salt solution.  The wound was hydrated with balanced salt solution and checked for fluid leaks but none were noted.  The eye was dressed with topical Pred Forte, Ocuflox, Voltaren and a Fox shield and the patient was taken to the recovery room in excellent condition where she received written and verbal instructions for her postoperative care and was scheduled for follow up in  24 hours. DD:  07/26/00 TD:  07/26/00 Job: [id number]

## 2010-12-23 NOTE — Op Note (Signed)
Stacy [name], Stacy [name]             ACCOUNT NO.:  [id number]   MEDICAL RECORD NO.:  [id number]          PATIENT TYPE:  OIB   LOCATION:  2899                         FACILITY:  [facility]   PHYSICIAN:  Duke Salvia, MD, FACCDATE OF BIRTH:  06/16/1941   DATE OF PROCEDURE:  07/05/2006  DATE OF DISCHARGE:  07/05/2006                               OPERATIVE REPORT   PREOPERATIVE DIAGNOSIS:  Previously implanted pacemaker, now at ERI,  with reversion to VVI mode.   POSTOPERATIVE DIAGNOSIS:  Previously implanted pacemaker, now at ERI,  with reversion to VVI mode.   PROCEDURE:  Explantation of a previously implanted device and  implantation of new dual-chamber device.   SURGEON:  Duke Salvia, MD, [facility].   DESCRIPTION OF PROCEDURE:  Following the obtaining of informed consent,  the patient was brought to the catheterization laboratory and placed on  the fluoroscopic table in the supine position.  After routine prep and  drape, lidocaine was infiltrated along the line of the previous incision  and carried down to the layer of the device pocket using sharp  dissection.  The pocket was opened.  The device was explanted.  The  leads were freed up.   The previously implanted atrial lead, Medtronic 4592, was assessed.  I  should note that the serial number was [id number].  The P wave was 3.6  with a pacing impedance of 528, and a threshold of 0.6 V at 0.5 msec,  with a current at threshold of 0.9 mA.   The previously implanted ventricular lead was a 6940, 62-cm length lead,  serial #[id number].  The R wave was 18.7 with an impedance of 794,  threshold of 1 V at 0.5 msec, and the catheters was 1.6 mA.   The pocket was copiously irrigated with antibiotic-containing saline  solution, and in the freeing of the pocket, the posterior fibrous  capsule was removed.  The leads and the pulse generator were placed back  in the pocket.  The wound was then closed in 3 layers in the normal  fashion.   The wound was washed and dried, and a benzoin and Steri-Strips  dressing was then applied.  Needle counts, sponge counts and instrument  counts were correct at the end of the procedure according to the staff.  The patient tolerated the procedure without apparent complication.      Duke Salvia, MD, [facility]  Electronically Signed     SCK/MEDQ  D:  07/05/2006  T:  07/06/2006  Job:  [id number]

## 2010-12-23 NOTE — Op Note (Signed)
[place]  Patient:    Stacy [name], Stacy [name]                    MRN: [id number] Proc. Date: 12/26/99 Adm. Date:  [id number] Disc. Date: [id number] Attending:  Nathen May CC:         Electrophysiology Laboratory             [place] [facility]:  Attn:  Katheren Puller. Charmayne Sheer., M.D. - [facility]                           Operative Report  PREOPERATIVE DIAGNOSES:  Bradycardia with second degree heart block.  Heart rate is less than 30.  POSTOPERATIVE DIAGNOSES:  Bradycardia with second degree heart block.  Heart rate is less than 30.  PROCEDURE:  Implantation of a dual chamber rate responsive pacer with contrast venography.  DESCRIPTION OF PROCEDURE:  Following the obtainment of informed consent the patient was brought to electrophysiology laboratory and placed on the fluoroscopic table in supine position.  After routine prep and drape of the left upper chest, intravenous contrast was injected via the left antecubital vein to identify the course and patency of the extrathoracic left subclavian vein.  This having been accomplished, lidocaine was infiltrated in the prepectoral subclavicular region and incision was made and carried down to the layer of the prepectoral fascia using electrocautery.  A pocket was formed using electrocautery.  Hemostasis was obtained.  Thereafter attention was turned to gaining access to the extrathoracic left subclavian vein which was accomplished without difficulty without puncture of the artery or aspiration of air.  Two venipunctures were completed and 2 guidewires were placed and retained.  A 0 silk suture was placed in a figure-of-eight fashion and allowed to hang loosely.  Subsequently 9 [place], and 7 French trocar introducer sheaths were placed through which were passed a Medtronic active fixation ventricular lead 6940-52 cm Serial #[id number] and an ______ fixation  Medtronics 4592 atrial lead 45 cm in length Serial #[id number].  Under fluoroscopic guidance these were manipulated into the right ventricular apex and the right atrial appendage respectively where the bipolar R wave was 11.2 millivolts with a pacing impedance of 909 ohms and a pacing threshold immediately after screw in of 1.1 volts at 0.5 milliseconds.  A current of threshold to 1.7 MA and there was no diaphragmatic pacing at 10 volts.  The bipolar T wave was 3.7 millivolts with a pacing impedance of 442 ohms and a pacing threshold of 0.5 milliseconds at 0.9 volts and a current of threshold of 2.8 MA.  With these acceptable parameters recorded, the leads were secured to the prepectoral fascia.  The hemostatic suture was removed and another suture was replaced.  The pocket was irrigated copiously with antibiotic containing saline solution and the leads were then attached to a Medtronics *Kappa VR4 3 pulse generator, Serial #[id number].  Ventricular and then AV pacing were identified.  The leads in the pulse generator were then placed in the pocket, secured to the prepectoral fascia, and the wound was closed in 3 layers in a normal fashion.  The wound was washed, dried, and a Benzoin steri-strips dressing was then applied.  Needle count, sponge counts, and instrument counts were correct at the end of the procedure according to the EP lab staff.  The patients pacemaker was  programmed at 60-130 and the rate response is on.  COMPLICATIONS:  None apparent. DD:  12/26/99 TD:  12/30/99 Job: [id number]

## 2010-12-23 NOTE — Assessment & Plan Note (Signed)
[facility]                         ELECTROPHYSIOLOGY OFFICE NOTE   Stacy, [name]                    MRN:          [id number]  DATE:07/05/2006                            DOB:          [birth year]    The patient is a very pleasant 70 year old woman with a history of  paroxysmal atrial fibrillation, symptomatic bradycardia, and chronic  Coumadin therapy, who underwent pacemaker insertion several years ago.  She has now reached device ERI.  She has a Medtronic Kappa 400 in place.  This device reverts to the VVI mode and has been pacing her  symptomatically since reversion to this mode.  She otherwise denies  specific complaints today.   PHYSICAL EXAMINATION:  GENERAL:  She is a pleasant, well-appearing woman  in no acute distress.  VITAL SIGNS:  Blood pressure was 118/70, pulse 68 and regular,  respirations 18.  The weight was 137 pounds.  NECK:  No jugular venous distention.  LUNGS:  Clear bilaterally to auscultation.  CARDIOVASCULAR:  Regular rate and rhythm with normal S1 and S2.  EXTREMITIES:  No edema.   IMPRESSION:  1. Symptomatic bradycardia.  2. Status post pacemaker insertion.  3. Paroxysmal atrial fibrillation.  4. History of stroke.   DISCUSSION:  Ms. Rhome is ready for a pacemaker generator change.  Will plan to obtain a PT INR today and other admission labs, and then re-  interrogate her device with changing her generator out later today or  tomorrow.     Doylene Canning. Ladona Ridgel, MD  Electronically Signed    GWT/MedQ  DD: 07/05/2006  DT: 07/05/2006  Job #: [id number]

## 2010-12-23 NOTE — H&P (Signed)
NAME:  Stacy [name], Stacy [name]             ACCOUNT NO.:  [id number]   MEDICAL RECORD NO.:  [id number]          PATIENT TYPE:  OIB   LOCATION:  2899                         FACILITY:  [facility]   PHYSICIAN:  Maple Mirza, PA   DATE OF BIRTH:  May 23, 1941   DATE OF ADMISSION:  07/05/2006  DATE OF DISCHARGE:  07/05/2006                              HISTORY & PHYSICAL   ALLERGIES:  She has ALLERGIES to both CODEINE and PENICILLIN.   ELECTROPHYSIOLOGIST:  Dr. Sherryl Manges.   PRESENTING CIRCUMSTANCE:  I'm here because my pacemaker is out of  juice.   HISTORY OF PRESENT ILLNESS:  Stacy [name] is a 70 year old female.  She  had a Medtronic Kappa 403 implanted Dec 26, 1999 for complete heart  block.  She was seen int he office March of 2007 by Dr. Graciela Husbands.  Her  pacemaker was interrogated at that time.  It was known to be approaching  end of life but a number of months still left.  She has been  transmitting by telephone on a regular basis the parameters of her  pacemaker to [employer].  They had advised her yesterday to present to the  office on November 30.  They called once again and asked her to come to  the office today on November 29.  The device was interrogated.  It is in  VVI mode.  The battery voltage is 2.69.  It is determined to be at  elective replacement indicator.  Patient was transferred from [facility] office to [facility] for a generator change today,  November 29.   PAST MEDICAL HISTORY:  Includes:  1. Complete heart block/tachy-brady syndrome status post pacemaker      implantation.  2. History of paroxysmal atrial fibrillation, on Coumadin.  3. Hypertension.  4. Bipolar syndrome.  5. Treated hypothyroidism.   PAST SURGICAL HISTORY:  Includes a right inferior parathyroidectomy.  In  preparation for that procedure, the patient had clearance, including a  stress study which showed ejection fraction 77% with no evidence of  ischemia.   MEDICATIONS:  Stacy [name] medications include:  1. Coumadin as directed by the pharmacy.  2. Levothyroxine 75 mcg daily.  3. Protonix 40 mg daily.  4. Aricept 10 mg daily.  5. Prozac 20 mg daily.  6. Lithium carbonate 300 mg daily.  7. Lisinopril 5 mg daily.  8. Caltrate with vitamin D, one tab daily.  9. Iron tablets, one tab daily.   PHYSICAL EXAM:  GENERAL:  Stacy [name] presents alert and oriented  x3, in no acute distress.  She has lately had chest pain, no shortness  of breath with exertion or orthopnea, no paroxysmal nocturnal dyspnea,  no lower extremity edema, no palpitations, no syncope or presyncope.  VITAL SIGNS:  Temperature 98.1, pulse is 74, respirations 18, blood  pressure 120/69, oxygen saturation 98% on room air.  Weight is 131,  height 5 feet and 1/2 inches.  HEENT:  Eyes:  Pupils equal, round, reactive to light.  Extraocular  movements intact.  NECK:  Supple.  No carotid bruits auscultated.  No jugular venous  distention.  LUNGS:  Clear to auscultation bilaterally.  HEART:  Regular rate and rhythm.  Electrocardiogram taken in the office  today shows sinus rhythm.  ABDOMEN:  Soft, nondistended.  Bowel sounds are present.  EXTREMITIES:  Show that the patient has dorsalis pedis pulses which are  readily palpable bilaterally, so are the radial pulses bilaterally.  NEUROLOGIC:  Grossly intact.   IMPRESSION:  History of tachy-brady syndrome/complete heart block status  post pacemaker implantation, 2001.   PLAN:  Generator change today for pacemaker at elective replacement  indicator.      Maple Mirza, PA     GM/MEDQ  D:  07/05/2006  T:  07/06/2006  Job:  [id number]

## 2010-12-23 NOTE — Discharge Summary (Signed)
NAME:  Stacy [name], Stacy [name]                       ACCOUNT NO.:  [id number]   MEDICAL RECORD NO.:  [id number]                   PATIENT TYPE:  INP   LOCATION:  3741                                 FACILITY:  [facility]   PHYSICIAN:  Chinita Pester, C.R.N.P. [facility]           DATE OF BIRTH:  02-Jul-1941   DATE OF ADMISSION:  04/04/2002  DATE OF DISCHARGE:  04/11/2002                                 DISCHARGE SUMMARY   PRIMARY DISCHARGE DIAGNOSIS:  Atrial flutter.   SECONDARY DIAGNOSIS:  1. Status post transient ischemic attack post procedure.  2. Intermittent complete heart block status post pacemaker.  3. Atrial high rate episodes associated with atrial flutter.  4. Sarcoid heart disease.  5. Hypertension.  6. Bipolar disorder.   HISTORY OF PRESENT ILLNESS:  The patient is a 70 year old female who is seen  in the electrophysiology office.  She has question of sarcoid heart disease.  She has a history of complete heart block and is status post permanent  pacemaker. Also atrial high rate episode of specific mechanism which is not  clear.  She came for an office visit for follow up appointment for her  pacemaker and had no further complaints.  The patient was to be admitted for  an atrial flutter ablation.   HOSPITAL COURSE:  The patient was admitted on April 04, 2002 and underwent  atrial flutter ablation.  Post procedure the patient complained of blurred  vision.  A neurological consultation was obtained.  The patient was placed  on heparin and Coumadin for a transient ischemic attack.  CT scan of the  head showed no evidence of acute intracranial abnormalities.  An MRI was  unable to be done secondary to her having a permanent pacemaker.  Neurology  continued to follow the patient.  She was continued on heparin until her INR  was therapeutic for greater than 24 hours with heparin overlap.  A carotid  ultrasound was performed which showed bilateral plaque with no significant  internal carotid  artery stenosis.  The patient underwent a TEE  bubble study  which was reported as showing a small PFO although the results are not on  the chart, information was passed along by Dr. Dietrich Pates.  The patient was  to continue on Coumadin therapy.  She was discharged to home in stable  condition on the following medications.   DISCHARGE MEDICATIONS:  1. Coumadin 5 mg nightly.  2. Lithium 450 daily and 225 at night.  3. Protonix 40 mg daily.  4. Synthroid 125 mcg daily.  5. Avapro 150 daily.  6. Prozac 20 mg daily.  7. Enteric coated aspirin 81 mg daily.  8. Tranxene 3.75 mg nightly.  9. Hydrochlorothiazide 25 mg daily.  10.      Patient is to continue taking antibiotics before any dental, urine,     bowel or GYN procedures.   DIET:  Patient is to be on a low fat,  low cholesterol diet.   WOUND CARE:  The patient is to call should she develop a lump or any  drainage in her groin.   FOLLOW UP:  An appointment is scheduled at the Coumadin clinic on Monday,  April 14, 2002 at 11:00 a.m. and she is to see Dr. Melvyn Novas of  [facility] on May 09, 2002 at 1:30 p.m.  An appointment is  scheduled with Dr. Duke Salvia on May 02, 2002 at 10:30 a.m.                                               Chinita Pester, C.R.N.P. [facility]    DS/MEDQ  D:  04/11/2002  T:  04/14/2002  Job:  [id number]   cc:   Tresa Endo L. Philipp Deputy, M.D.

## 2010-12-23 NOTE — Op Note (Signed)
Stacy [name], Stacy [name]             ACCOUNT NO.:  [id number]   MEDICAL RECORD NO.:  [id number]          PATIENT TYPE:  OIB   LOCATION:  5708                         FACILITY:  [facility]   PHYSICIAN:  Velora Heckler, MD      DATE OF BIRTH:  05/03/1941   DATE OF PROCEDURE:  12/07/2005  DATE OF DISCHARGE:                                 OPERATIVE REPORT   PREOPERATIVE DIAGNOSIS:  Primary hyperparathyroidism.   POSTOPERATIVE DIAGNOSIS:  Primary hyperparathyroidism.   PROCEDURE:  Right inferior minimally invasive parathyroidectomy.   SURGEON:  Velora Heckler, MD, FACS   ASSISTANT:  Manus Rudd, MD   ANESTHESIA:  General.   ESTIMATED BLOOD LOSS:  Minimal.   PREPARATION:  Betadine.   COMPLICATIONS:  None.   INDICATIONS:  The patient is a 70 year old white female from [place],  [place] referred by Dr. Molli Knock for newly diagnosed primary  hyperparathyroidism.  The patient was noted to have elevated serum calcium  levels ranging from 12.5-13.2.  An intact-PTH level was elevated at 155.  A  sestamibi scan localized an abnormal area of uptake in the right inferior  position.  The patient now comes to surgery for resection.   BODY OF REPORT:  Procedure was done in OR #16 at [facility].  The patient was brought to the operating room, placed in supine  position on the operating room table.  Following administration of general  anesthesia, the patient is positioned and then prepped and draped in usual  strict aseptic fashion.  After ascertaining that an adequate level of  anesthesia been obtained, a right neck incision was made with a #15 blade.  Dissection was carried through subcutaneous tissues and platysma.  Hemostasis was obtained with electrocautery.  Skin flaps were developed  cephalad and caudad.  The Weitlaner retractor was placed for exposure.  Strap muscles were incised in the midline and reflected to the right.  Inferior pole of the thyroid  was visualized.  There is a nodular density in  the tracheoesophageal groove which was quite prominent.  This was gently  dissected out.  Hemostasis was obtained with electrocautery.  Vascular  structures were divided between small and medium Ligaclips.  This appears to  be a markedly enlarged parathyroid gland.  It measures approximately 2 cm in  greatest dimension.  It is completely excised and submitted to pathology.  Dr. Jimmy Picket confirms on frozen section biopsy at this was parathyroid  tissue consistent with parathyroid adenoma.  Good hemostasis was noted.  Surgicel was placed in the bed of the parathyroid gland.  Strap muscles were  reapproximated in the midline with interrupted 4-0 Vicryl sutures.  Platysma  was closed with interrupted 4-0 Vicryl sutures.  Skin was anesthetized with  local Marcaine anesthetic.  Skin edges were reapproximated running 4-0 Vicryl subcuticular suture.  Wound is washed and dried.  Benzoin, Steri-Strips were applied.  Sterile  dressings were applied.  The patient is awakened from anesthesia and brought  to the recovery room in stable condition.  The patient tolerated the  procedure well.  Velora Heckler, MD  Electronically Signed     TMG/MEDQ  D:  12/07/2005  T:  12/08/2005  Job:  [id number]   cc:   David Stall, M.D.  Fax: [phone]   Duke Salvia, M.D.  [address]  [address]  [place]  [place] [address]

## 2010-12-30 ENCOUNTER — Other Ambulatory Visit: Payer: Self-pay | Admitting: Internal Medicine

## 2011-01-03 ENCOUNTER — Encounter: Payer: Medicare HMO | Attending: Internal Medicine | Admitting: *Deleted

## 2011-01-03 DIAGNOSIS — E119 Type 2 diabetes mellitus without complications: Secondary | ICD-10-CM | POA: Insufficient documentation

## 2011-01-03 DIAGNOSIS — Z713 Dietary counseling and surveillance: Secondary | ICD-10-CM | POA: Insufficient documentation

## 2011-01-19 ENCOUNTER — Encounter: Payer: Self-pay | Admitting: Internal Medicine

## 2011-01-19 DIAGNOSIS — Z86010 Personal history of colonic polyps: Secondary | ICD-10-CM

## 2011-01-23 ENCOUNTER — Ambulatory Visit (INDEPENDENT_AMBULATORY_CARE_PROVIDER_SITE_OTHER): Payer: Medicare HMO | Admitting: "Endocrinology

## 2011-01-23 VITALS — BP 135/83 | HR 83 | Wt 145.4 lb

## 2011-01-23 DIAGNOSIS — E063 Autoimmune thyroiditis: Secondary | ICD-10-CM

## 2011-01-23 DIAGNOSIS — E038 Other specified hypothyroidism: Secondary | ICD-10-CM

## 2011-01-23 DIAGNOSIS — E892 Postprocedural hypoparathyroidism: Secondary | ICD-10-CM

## 2011-01-23 DIAGNOSIS — E209 Hypoparathyroidism, unspecified: Secondary | ICD-10-CM

## 2011-01-23 DIAGNOSIS — IMO0001 Reserved for inherently not codable concepts without codable children: Secondary | ICD-10-CM

## 2011-01-23 DIAGNOSIS — E559 Vitamin D deficiency, unspecified: Secondary | ICD-10-CM

## 2011-01-23 LAB — POCT GLYCOSYLATED HEMOGLOBIN (HGB A1C): Hemoglobin A1C: 5.4

## 2011-01-23 NOTE — Patient Instructions (Signed)
Please try to walk for 30-60 minutes daily.

## 2011-01-28 LAB — TSH: TSH: 3.486 u[IU]/mL (ref 0.350–4.500)

## 2011-01-29 LAB — VITAMIN D 1,25 DIHYDROXY
Vitamin D 1, 25 (OH)2 Total: 39 pg/mL (ref 18–72)
Vitamin D2 1, 25 (OH)2: <8 pg/mL
Vitamin D3 1, 25 (OH)2: 39 pg/mL

## 2011-02-02 ENCOUNTER — Other Ambulatory Visit: Payer: Self-pay | Admitting: *Deleted

## 2011-02-06 ENCOUNTER — Telehealth: Payer: Self-pay

## 2011-02-06 MED ORDER — BAYER CONTOUR MONITOR W/DEVICE KIT: 1.0000 | PACK | Status: DC

## 2011-02-06 NOTE — Telephone Encounter (Signed)
Pt called requesting Rx for Bayer contour glucometer.

## 2011-02-13 ENCOUNTER — Other Ambulatory Visit: Payer: Self-pay

## 2011-02-13 MED ORDER — GLUCOSE BLOOD VI STRP: ORAL_STRIP | Status: DC

## 2011-02-16 ENCOUNTER — Encounter: Payer: Medicare HMO | Admitting: *Deleted

## 2011-02-23 ENCOUNTER — Encounter: Payer: Self-pay | Admitting: *Deleted

## 2011-02-25 ENCOUNTER — Other Ambulatory Visit: Payer: Self-pay | Admitting: Internal Medicine

## 2011-02-27 ENCOUNTER — Ambulatory Visit (INDEPENDENT_AMBULATORY_CARE_PROVIDER_SITE_OTHER): Payer: Medicare HMO | Admitting: *Deleted

## 2011-02-27 DIAGNOSIS — I4891 Unspecified atrial fibrillation: Secondary | ICD-10-CM

## 2011-02-27 DIAGNOSIS — I442 Atrioventricular block, complete: Secondary | ICD-10-CM

## 2011-02-27 LAB — REMOTE PACEMAKER DEVICE
AL AMPLITUDE: 2.8 mv
AL IMPEDENCE PM: 590 Ohm
AL THRESHOLD: 0.75 V
RV LEAD IMPEDENCE PM: 1286 Ohm
RV LEAD THRESHOLD: 1.25 V
VENTRICULAR PACING PM: 5

## 2011-03-01 NOTE — Progress Notes (Signed)
Pacer remote check  

## 2011-03-06 ENCOUNTER — Other Ambulatory Visit: Payer: Self-pay | Admitting: Internal Medicine

## 2011-03-17 ENCOUNTER — Other Ambulatory Visit: Payer: Self-pay | Admitting: Internal Medicine

## 2011-03-29 ENCOUNTER — Encounter: Payer: Self-pay | Admitting: *Deleted

## 2011-04-05 ENCOUNTER — Ambulatory Visit: Payer: Medicare HMO | Admitting: *Deleted

## 2011-04-28 LAB — LIPID PANEL
HDL: 43
HDL: 46
LDL Cholesterol: 142 — ABNORMAL HIGH
Triglycerides: 145
Triglycerides: 231 — ABNORMAL HIGH
VLDL: 46 — ABNORMAL HIGH

## 2011-04-28 LAB — CBC
HCT: 39.8
Hemoglobin: 13.6
Hemoglobin: 14.2
MCHC: 34.1
MCHC: 34.6
MCV: 91.7
MCV: 92.3
Platelets: 243
RBC: 4.32
RBC: 4.47
WBC: 11 — ABNORMAL HIGH
WBC: 9.9

## 2011-04-28 LAB — COMPREHENSIVE METABOLIC PANEL
AST: 23
Albumin: 3.5
Alkaline Phosphatase: 58
Chloride: 100
GFR calc Af Amer: >60
Potassium: 3.5
Total Bilirubin: 0.4

## 2011-04-28 LAB — BASIC METABOLIC PANEL
CO2: 27
CO2: 31
Chloride: 100
Chloride: 101
Creatinine, Ser: 0.97
GFR calc Af Amer: >60
GFR calc Af Amer: >60
Potassium: 3.9
Potassium: 4.6
Sodium: 133 — ABNORMAL LOW
Sodium: 139

## 2011-04-28 LAB — I-STAT 8, (EC8 V) (CONVERTED LAB)
Acid-Base Excess: 3 — ABNORMAL HIGH
BUN: 14
Bicarbonate: 27.2 — ABNORMAL HIGH
HCT: 44
Hemoglobin: 15
Operator id: 198171
Sodium: 136
TCO2: 28
pCO2, Ven: 40.4 — ABNORMAL LOW

## 2011-04-28 LAB — POCT CARDIAC MARKERS
CKMB, poc: 1.6
Operator id: 198171
Troponin i, poc: <0.05

## 2011-04-28 LAB — PROTIME-INR
INR: 2.2 — ABNORMAL HIGH
INR: 3.2 — ABNORMAL HIGH
Prothrombin Time: 17.7 — ABNORMAL HIGH
Prothrombin Time: 34.1 — ABNORMAL HIGH

## 2011-04-28 LAB — POCT I-STAT CREATININE: Creatinine, Ser: 1.1

## 2011-04-28 LAB — CARDIAC PANEL(CRET KIN+CKTOT+MB+TROPI)
CK, MB: 8.3 — ABNORMAL HIGH
Relative Index: INVALID
Total CK: 116

## 2011-04-28 LAB — APTT: aPTT: 60 — ABNORMAL HIGH

## 2011-04-28 LAB — TROPONIN I: Troponin I: 0.17 — ABNORMAL HIGH

## 2011-05-03 ENCOUNTER — Ambulatory Visit (INDEPENDENT_AMBULATORY_CARE_PROVIDER_SITE_OTHER): Payer: Medicare HMO | Admitting: "Endocrinology

## 2011-05-03 VITALS — BP 140/86 | HR 72 | Wt 143.4 lb

## 2011-05-03 DIAGNOSIS — R251 Tremor, unspecified: Secondary | ICD-10-CM

## 2011-05-03 DIAGNOSIS — R259 Unspecified abnormal involuntary movements: Secondary | ICD-10-CM

## 2011-05-03 DIAGNOSIS — E049 Nontoxic goiter, unspecified: Secondary | ICD-10-CM

## 2011-05-03 DIAGNOSIS — F3160 Bipolar disorder, current episode mixed, unspecified: Secondary | ICD-10-CM

## 2011-05-03 DIAGNOSIS — R7303 Prediabetes: Secondary | ICD-10-CM

## 2011-05-03 DIAGNOSIS — K219 Gastro-esophageal reflux disease without esophagitis: Secondary | ICD-10-CM

## 2011-05-03 DIAGNOSIS — E038 Other specified hypothyroidism: Secondary | ICD-10-CM

## 2011-05-03 DIAGNOSIS — E063 Autoimmune thyroiditis: Secondary | ICD-10-CM

## 2011-05-03 DIAGNOSIS — E559 Vitamin D deficiency, unspecified: Secondary | ICD-10-CM

## 2011-05-03 DIAGNOSIS — R7309 Other abnormal glucose: Secondary | ICD-10-CM

## 2011-05-03 DIAGNOSIS — I10 Essential (primary) hypertension: Secondary | ICD-10-CM

## 2011-05-03 LAB — POCT GLYCOSYLATED HEMOGLOBIN (HGB A1C): Hemoglobin A1C: 5.7

## 2011-05-03 NOTE — Patient Instructions (Signed)
Followup visit in 4 months. These have a thyroid test run in 2 months.

## 2011-05-05 LAB — DIFFERENTIAL
Basophils Relative: 0
Eosinophils Absolute: 0
Eosinophils Absolute: 0
Eosinophils Absolute: 0.2
Eosinophils Relative: 1
Lymphocytes Relative: 11 — ABNORMAL LOW
Lymphs Abs: 1.7
Lymphs Abs: 2.2
Lymphs Abs: 2.4
Monocytes Absolute: 0.9
Monocytes Relative: 7
Monocytes Relative: 7
Monocytes Relative: 7
Neutro Abs: 12.4 — ABNORMAL HIGH
Neutro Abs: 14.7 — ABNORMAL HIGH
Neutrophils Relative %: 80 — ABNORMAL HIGH
Neutrophils Relative %: 82 — ABNORMAL HIGH

## 2011-05-05 LAB — CULTURE, BLOOD (ROUTINE X 2): Culture: NO GROWTH

## 2011-05-05 LAB — CBC
HCT: 32.5 — ABNORMAL LOW
HCT: 33.6 — ABNORMAL LOW
HCT: 33.9 — ABNORMAL LOW
HCT: 35 — ABNORMAL LOW
HCT: 35.1 — ABNORMAL LOW
Hemoglobin: 11.2 — ABNORMAL LOW
Hemoglobin: 11.4 — ABNORMAL LOW
Hemoglobin: 11.5 — ABNORMAL LOW
Hemoglobin: 12
MCHC: 33.8
MCHC: 34.5
MCV: 91.3
MCV: 91.5
MCV: 91.5
MCV: 92.9
Platelets: 281
Platelets: 337
RBC: 3.57 — ABNORMAL LOW
RBC: 3.58 — ABNORMAL LOW
RBC: 3.67 — ABNORMAL LOW
RBC: 3.71 — ABNORMAL LOW
RBC: 4.04
RDW: 13.3
RDW: 13.5
WBC: 10.6 — ABNORMAL HIGH
WBC: 13 — ABNORMAL HIGH
WBC: 14.4 — ABNORMAL HIGH
WBC: 15.2 — ABNORMAL HIGH

## 2011-05-05 LAB — PROTIME-INR
INR: 2.6 — ABNORMAL HIGH
INR: 2.9 — ABNORMAL HIGH
Prothrombin Time: 21 — ABNORMAL HIGH
Prothrombin Time: 29.5 — ABNORMAL HIGH

## 2011-05-05 LAB — BASIC METABOLIC PANEL
BUN: 7
CO2: 28
CO2: 28
Calcium: 8.9
Chloride: 100
Chloride: 102
GFR calc Af Amer: >60
GFR calc Af Amer: >60
GFR calc non Af Amer: >60
Glucose, Bld: 119 — ABNORMAL HIGH
Glucose, Bld: 148 — ABNORMAL HIGH
Potassium: 3.3 — ABNORMAL LOW
Potassium: 4
Potassium: 4.2
Sodium: 137
Sodium: 140

## 2011-05-05 LAB — HEPATIC FUNCTION PANEL
ALT: 18
AST: 20
Albumin: 2.9 — ABNORMAL LOW
Alkaline Phosphatase: 88
Total Protein: 6.6

## 2011-05-05 LAB — FECAL LACTOFERRIN, QUANT: Fecal Lactoferrin: POSITIVE

## 2011-05-05 LAB — ANAEROBIC CULTURE

## 2011-05-05 LAB — LITHIUM LEVEL: Lithium Lvl: 0.37 — ABNORMAL LOW

## 2011-05-05 LAB — CLOSTRIDIUM DIFFICILE EIA
C difficile Toxins A+B, EIA: NEGATIVE
C difficile Toxins A+B, EIA: NEGATIVE

## 2011-05-05 LAB — URINE MICROSCOPIC-ADD ON

## 2011-05-05 LAB — COMPREHENSIVE METABOLIC PANEL
Alkaline Phosphatase: 92
BUN: 12
CO2: 29
Calcium: 9.3
GFR calc non Af Amer: 56 — ABNORMAL LOW
Glucose, Bld: 130 — ABNORMAL HIGH
Total Protein: 7.3

## 2011-05-05 LAB — URINALYSIS, ROUTINE W REFLEX MICROSCOPIC
Glucose, UA: NEGATIVE
Hgb urine dipstick: NEGATIVE
Protein, ur: NEGATIVE
pH: 7

## 2011-05-05 LAB — LIPASE, BLOOD: Lipase: 18

## 2011-05-05 LAB — CULTURE, ROUTINE-ABSCESS

## 2011-05-09 ENCOUNTER — Other Ambulatory Visit: Payer: Self-pay | Admitting: Internal Medicine

## 2011-05-22 ENCOUNTER — Other Ambulatory Visit: Payer: Self-pay | Admitting: Internal Medicine

## 2011-06-01 ENCOUNTER — Other Ambulatory Visit: Payer: Self-pay | Admitting: Internal Medicine

## 2011-06-01 ENCOUNTER — Encounter: Payer: Self-pay | Admitting: Internal Medicine

## 2011-06-01 ENCOUNTER — Ambulatory Visit (INDEPENDENT_AMBULATORY_CARE_PROVIDER_SITE_OTHER): Payer: Medicare HMO | Admitting: *Deleted

## 2011-06-01 DIAGNOSIS — I442 Atrioventricular block, complete: Secondary | ICD-10-CM

## 2011-06-01 DIAGNOSIS — I4891 Unspecified atrial fibrillation: Secondary | ICD-10-CM

## 2011-06-04 LAB — REMOTE PACEMAKER DEVICE
AL THRESHOLD: 0.75 V
ATRIAL PACING PM: 50
RV LEAD THRESHOLD: 1 V

## 2011-06-08 NOTE — Progress Notes (Signed)
Pacer remote check  

## 2011-06-21 ENCOUNTER — Other Ambulatory Visit: Payer: Self-pay | Admitting: *Deleted

## 2011-06-21 MED ORDER — LOSARTAN POTASSIUM 50 MG PO TABS: 50.0000 mg | ORAL_TABLET | Freq: Every day | ORAL | Status: DC

## 2011-06-27 ENCOUNTER — Encounter: Payer: Self-pay | Admitting: *Deleted

## 2011-07-01 ENCOUNTER — Encounter: Payer: Self-pay | Admitting: "Endocrinology

## 2011-07-01 DIAGNOSIS — E063 Autoimmune thyroiditis: Secondary | ICD-10-CM | POA: Insufficient documentation

## 2011-07-01 DIAGNOSIS — E210 Primary hyperparathyroidism: Secondary | ICD-10-CM | POA: Insufficient documentation

## 2011-07-01 DIAGNOSIS — E215 Disorder of parathyroid gland, unspecified: Secondary | ICD-10-CM | POA: Insufficient documentation

## 2011-07-01 DIAGNOSIS — E559 Vitamin D deficiency, unspecified: Secondary | ICD-10-CM | POA: Insufficient documentation

## 2011-07-01 NOTE — Progress Notes (Addendum)
Subjective:  Patient Name: Stacy [name] Date of Birth: Dec 29, 1940  MRN: [id number]  Stacy [name]  presents to the office today for follow-up of her Follow-up   HISTORY OF PRESENT ILLNESS:   Stacy [name] is a 70 y.o. Caucasian woman.  Stacy [name] was unaccompanied.  1. Patient was first referred to me on 09/04/05 for evaluation and management of chronic hypothyroidism and new hypercalcemia and hyperparathyroidism. The patient had developed hypothyroidism some 15 years previously. Since she never had thyroid surgery or thyroid irradiation, it was presumed that her hypothyroidism was due to Hashimoto's thyroiditis. The patient had a routine chemistry panel performed on 08/12/05 in which the calcium was 12.5 (normal 8.4-10.5) per. Repeat labs were done on 08/805 which showed a calcium of 11.8 and a PTH value of 130.5 (normal 14-72). Calcitonin was less than 2.0. Repeat labs on 08/17/05 again showed the calcium to be elevated with an ionized calcium of 1.59 (1.12-1.32). PTH was elevated at 138. Laboratory tests on her 09/04/05 showed a TSH of 0.797, free T4-1 0.8, and free T3 of 2.2. Calcium was 13.2, parathyroid hormone was 155.3, 25 vitamin D was 7, and 1, 25 vitamin D. was 40. It appeared at that point that the patient was definitely vitamin D deficient. It also seemed likely that she might have been vitamin D deficient for years, that she might have developed secondary hyperparathyroidism, which then evolved into tertiary hyperparathyroidism. I ordered a nuclear medicine parathyroid scan to be performed on 09/11/05. The scan showed a persistent focus of abnormal increased tracer uptake adjacent to the lower pole of the right lobe of the thyroid gland. I referred her to Dr. Vallery Sa of our local thyroid and parathyroid surgeons, who performed a parathyroidectomy on 12/07/05. The PTH values subsequently normalized. In the past 3 years the PTH values have varied from 33.2-76.5, depending on how consistent the  patient has been with taking her calcium and vitamin D. During the same period of time, the patient has had waxing and waning of the thyroid gland size consistent with flareups of Hashimoto's disease, but has generally remained euthyroid. We gradually increased her Synthroid dose to 88 mcg on one day alternating with 100 mcg the next day 2. The patient has experienced many other problems during the last 5 years, to include bipolar disorder, GERD, fatigue, iron deficiency, 2 heart attacks, hypertension, orthostatic hypotension, and the need for a cardiac pacemaker. We've helped to manage some of those problems and kept her as supplied as we could with the samples of antihypertensive medications and Synthroid. The patient's last [facility] visit was on 07/26/10. In the interim, he was diagnosed with type 2 diabetes in March. She was started on metformin 500 mg once daily. She was also started on atorvastatin. 3. Pertinent Review of Systems:  Constitutional: The patient feels pretty well overall. Eyes: Her eyes have been matted upon awakening a fair amount. She went to an ophthalmologist who put her on eye drops for possible Sicca Syndrome. Vision is good otherwise. There are no other significant eye complaints. Neck: The patient has no complaints of anterior neck swelling, soreness, tenderness,  pressure, discomfort, or difficulty swallowing.  Heart: Heart rate increases with exercise or other physical activity. The patient has no complaints of palpitations, irregular heat beats, chest pain, or chest pressure. Gastrointestinal: She has diarrhea occasionally. Bowel movents seem normal most times.. The patient has no complaints of excessive hunger, acid reflux, upset stomach, stomach aches or pains. Legs: Muscle mass and strength seem normal. She  still has episodic pains in her left knee and left hip. There are no complaints of numbness, tingling, or burning. No edema is noted. Feet: There are no obvious foot  problems. There are no complaints of numbness, tingling, burning, or pain. No edema is noted. Hypoglycemia: None   PAST MEDICAL, FAMILY, AND SOCIAL HISTORY:  Past Medical History  Diagnosis Date  . Chronic bipolar disorder   . Coronary artery disease     s/p PTCA  . Complete AV block     s/p PPM--MEDTRONIC ADAPTA ADDr01  . Afib     ON PRADAXA  . Hypertension   . Hyperlipidemia   . Thyroid disease     HYPOTHROID  . Takotsubo syndrome   . GERD (gastroesophageal reflux disease)   . Diverticulosis   . Tobacco abuse     COPD  . COPD (chronic obstructive pulmonary disease)     TOBACCO ABUSE  . H/O: hysterectomy   . Abscess of liver 08/11/2009  . Atrial fibrillation 11/11/2009  . Atrioventricular block, complete 03/26/2008  . BIPOLAR AFFECTIVE DISORDER 02/25/2008  . COLONIC POLYPS, HX OF 08/11/2009  . CORONARY ARTERY DISEASE, S/P PTCA 03/26/2008  . DEPRESSION 08/11/2009  . DIABETES MELLITUS, TYPE II 09/08/2010  . DIVERTICULOSIS, COLON 02/25/2008  . DYSLIPIDEMIA 02/25/2008  . HYPERTENSION 02/25/2008  . HYPOTHYROIDISM 02/25/2008  . LIPOMA 01/17/2010  . TOBACCO ABUSE 03/22/2009  . Pacemaker-Medtronic-dual-chamber 11/17/2010  . TROCHANTERIC BURSITIS, LEFT 11/08/2009  . Parathyroid related hypercalcemia   . Primary hyperparathyroidism   . Vitamin D deficiency disease   . Thyroiditis, autoimmune     Family History  Problem Relation Age of Onset  . Heart disease Mother   . Colon cancer      FH  . Diabetes Maternal Aunt     Current outpatient prescriptions:Calcium Carbonate (CALCIUM 500 PO), Take 1 tablet by mouth 2 (two) times daily.  , Disp: , Rfl: ;  dabigatran (PRADAXA) 150 MG CAPS, Take 150 mg by mouth every 12 (twelve) hours.  , Disp: , Rfl: ;  FLUoxetine (PROZAC) 20 MG capsule, Take 20 mg by mouth daily.  , Disp: , Rfl: ;  lithium 300 MG capsule, Take 300 mg by mouth 2 (two) times daily with a meal.  , Disp: , Rfl:  Multiple Vitamin (MULTIVITAMIN) tablet, Take 1 tablet by mouth daily.  ,  Disp: , Rfl: ;  ONE TOUCH LANCETS MISC, by Does not apply route.  , Disp: , Rfl: ;  atorvastatin (LIPITOR) 80 MG tablet, TAKE 1 TABLET DAILY, Disp: 90 tablet, Rfl: 3;  Blood Glucose Monitoring Suppl (BAYER CONTOUR MONITOR) W/DEVICE KIT, 1 Device by Does not apply route as directed., Disp: 1 kit, Rfl: 0 glucose blood (BAYER CONTOUR TEST) test strip, Use as instructed, Disp: 100 each, Rfl: 12;  levothyroxine (SYNTHROID) 100 MCG tablet,  , Disp: , Rfl: ;  losartan (COZAAR) 50 MG tablet, Take 1 tablet (50 mg total) by mouth daily., Disp: 90 tablet, Rfl: 1;  metFORMIN (GLUCOPHAGE-XR) 500 MG 24 hr tablet, take 1 tablet by mouth every morning, Disp: 30 tablet, Rfl: 5;  NON FORMULARY, , Disp: , Rfl:  omeprazole (PRILOSEC) 40 MG capsule, TAKE 1 CAPSULE DAILY, Disp: 90 capsule, Rfl: 1;  SYNTHROID 88 MCG tablet, TAKE 1 TABLET EVERY OTHER DAY (ON EVEN DAYS), Disp: 45 each, Rfl: 3  Allergies as of 01/23/2011 - Review Complete 11/17/2010  Allergen Reaction Noted  . Codeine    . Metronidazole  03/26/2008  . Penicillins  1. Work and Family: No new issues 2. Activities: She keeps fairly busy to 3. Smoking, alcohol, or drugs: She still smokes  4. Primary Care Provider: Rene Paci, MD, MD  ROS: There are no other significant problems involving Stacy [name]'s other body systems.   Objective:  Vital Signs:  BP 135/83  Pulse 83  Wt 145 lb 6.4 oz (65.953 kg)   Ht Readings from Last 3 Encounters:  11/17/10 5\' 6"  (1.676 m)  09/07/10 5\' 7"  (1.702 m)  01/17/10 5\' 7"  (1.702 m)   Wt Readings from Last 3 Encounters:  05/03/11 143 lb 6.4 oz (65.046 kg)  01/23/11 145 lb 6.4 oz (65.953 kg)  11/17/10 144 lb 12.8 oz (65.681 kg)   PHYSICAL EXAM:  Constitutional: The patient appears healthy and well nourished.  The patient has lost 3 pounds since her last visit in December.  Face: The face appears normal.  Eyes: There is no obvious arcus or proptosis. Moisture appears normal. Mouth: The oropharynx and tongue  appear normal. Oral moisture is normal. Neck: The neck appears to be visibly normal. No carotid bruits are noted. The thyroid gland is  20 grams in size. The consistency of the thyroid gland is normal. The thyroid gland is not tender to palpation. Lungs: The lungs are clear to auscultation. Air movement is good. Heart: Heart rate and rhythm are regular.Heart sounds S1 and S2 are normal. I did not appreciate any pathologic cardiac murmurs. Abdomen: The abdomen appears to be normal in size. Bowel sounds are normal. There is no obvious hepatomegaly, splenomegaly, or other mass effect.  Arms: Muscle size and bulk are normal for age. Hands: There is no obvious tremor. Phalangeal and metacarpophalangeal joints are normal. Palmar muscles are normal. Palmar skin is normal. Palmar moisture is also normal. Legs: Muscles appear normal for age. No edema is present. Feet: Feet are normally formed. Dorsalis pedal pulses are normal 1+ bilaterally. No edema is present. Neurologic: Strength is normal for age in both the upper and lower extremities. Muscle tone is normal. Sensation to touch is normal in both the legs and feet.    LAB DATA: 08/15/10: TSH was 3.070, free T4 was 1.10, free T3 was 2.6. PTH was 53.3, calcium 10.2, 1,25-vitamin D 35, 25-vitamin D 35, iron 110.   Assessment and Plan:   ASSESSMENT:  1. Hypothyroid: The patient was borderline hypothyroid in January.  2. Hashimoto's thyroiditis: Clinically quiescent  3. Hypertension: Patient still needs to walk every day.  4. Prediabetes/diabetes: Treatment in this case is the same. She is currently on metformin. She needs to eat right and exercise right. She may benefit from one of the DPP-4 inhibitors and/or exenatide (Byetta) or liraglutide (Victoza). 5.  Fatigue: She is really feeling very well. 6.  Hyperparathyroidism and vitamin D deficiency: She was doing well in January.   PLAN:  1. Diagnostic:  TFTs, calcium, PTH, and vitamin D.  2.  Therapeutic:  Eat Right, exercise right (walk 60 minutes per day), and take metformin. Stop smoking. 3. Patient education: We spent quite a bit of time reviewing our Eat Right Diet plan.  4. Follow-up: Return in about 3 months (around 04/25/2011).  Level of Service: This visit lasted in excess of 40 minutes. More than 50% of the visit was devoted to counseling.    David Stall, MD 07/01/2011 9:08 PM           2

## 2011-07-02 ENCOUNTER — Encounter: Payer: Self-pay | Admitting: "Endocrinology

## 2011-07-02 NOTE — Progress Notes (Signed)
Subjective:  Patient Name: Stacy [name] Date of Birth: 1940/09/20  MRN: [id number]  Ayris Carano  presents to the office today for follow-up of her Hypothyroidism   HISTORY OF PRESENT ILLNESS:   Kayla is a 70 y.o. Caucasian woman.  Careen was unaccompanied.  1. Patient was first referred to me on 09/04/05 for evaluation and management of chronic hypothyroidism and new hypercalcemia and hyperparathyroidism. In brief, she developed vitamin D deficiency, and then probably secondary hyperparathyroidism, and then tertiary hyperparathyroidism. Right inferior parathyroid gland was quite enlarged. She was taken to surgery on 53/07 for parathyroidectomy. Since that time she's done well, provided that she has continue to take her calcium and vitamin D.  She has had some waxing and waning of her thyroid gland during these years, which is consistent with inflammatory flareups of her Hashimoto's disease. We've increased her thyroid hormone dose slightly over time. She now takes 88 mcg one day alternating with 100 mcg the next day. She has remained euthyroid.  2. The patient has experienced many other problems during the last 5 years, to include bipolar disorder, GERD, fatigue, iron deficiency, 2 heart attacks, hypertension, orthostatic hypotension, and the need for a cardiac pacemaker. We've helped to manage some of those problems and kept her as supplied as we could with the samples of antihypertensive medications and Synthroid. She was diagnosed with type 2 diabetes in March. She was started on metformin 500 mg once daily. She was also started on atorvastatin. The patient's last [facility] visit was on 01/23/11. In the interim, since her thyroid tests on 01/23/2011 were somewhat low, increased her Synthroid dose to 100 mcg per day. She is sometimes having issues with remembering. As she describes these episodes,  however, it seems that she really has more problems paying attention. When she pays attention she seems to  remember better. 3. Pertinent Review of Systems:  Constitutional: The patient feels "pretty good ". Her energy level still varies. Eyes: Vision is good as long as she wears her glasses the. There are no other significant eye complaints. Neck: The patient has no complaints of anterior neck swelling, soreness, tenderness,  pressure, discomfort, or difficulty swallowing.  Heart: Heart rate increases with exercise or other physical activity. The patient has no complaints of palpitations, irregular heat beats, chest pain, or chest pressure. Gastrointestinal: Bowel movents seem normal most times.. The patient has no complaints of excessive hunger, acid reflux, upset stomach, stomach aches or pains, diarrhea, or constipation. Legs: Muscle mass and strength seem normal. She still has episodic pains in her left knee and left hip. There are no complaints of numbness, tingling, or burning. No edema is noted. Feet: There are no obvious foot problems. There are no complaints of numbness, tingling, burning, or pain. No edema is noted. Hypoglycemia: None   PAST MEDICAL, FAMILY, AND SOCIAL HISTORY:  Past Medical History  Diagnosis Date  . Chronic bipolar disorder   . Coronary artery disease     s/p PTCA  . Complete AV block     s/p PPM--MEDTRONIC ADAPTA ADDr01  . Afib     ON PRADAXA  . Hypertension   . Hyperlipidemia   . Thyroid disease     HYPOTHROID  . Takotsubo syndrome   . GERD (gastroesophageal reflux disease)   . Diverticulosis   . Tobacco abuse     COPD  . COPD (chronic obstructive pulmonary disease)     TOBACCO ABUSE  . H/O: hysterectomy   . Abscess of liver 08/11/2009  .  Atrial fibrillation 11/11/2009  . Atrioventricular block, complete 03/26/2008  . BIPOLAR AFFECTIVE DISORDER 02/25/2008  . COLONIC POLYPS, HX OF 08/11/2009  . CORONARY ARTERY DISEASE, S/P PTCA 03/26/2008  . DEPRESSION 08/11/2009  . DIABETES MELLITUS, TYPE II 09/08/2010  . DIVERTICULOSIS, COLON 02/25/2008  . DYSLIPIDEMIA  02/25/2008  . HYPERTENSION 02/25/2008  . HYPOTHYROIDISM 02/25/2008  . LIPOMA 01/17/2010  . TOBACCO ABUSE 03/22/2009  . Pacemaker-Medtronic-dual-chamber 11/17/2010  . TROCHANTERIC BURSITIS, LEFT 11/08/2009  . Parathyroid related hypercalcemia   . Primary hyperparathyroidism   . Vitamin D deficiency disease   . Thyroiditis, autoimmune     Family History  Problem Relation Age of Onset  . Heart disease Mother   . Colon cancer      FH  . Diabetes Maternal Aunt     Current outpatient prescriptions:Blood Glucose Monitoring Suppl (BAYER CONTOUR MONITOR) W/DEVICE KIT, 1 Device by Does not apply route as directed., Disp: 1 kit, Rfl: 0;  Calcium Carbonate (CALCIUM 500 PO), Take 1 tablet by mouth 2 (two) times daily.  , Disp: , Rfl: ;  dabigatran (PRADAXA) 150 MG CAPS, Take 150 mg by mouth every 12 (twelve) hours.  , Disp: , Rfl: ;  FLUoxetine (PROZAC) 20 MG capsule, Take 20 mg by mouth daily.  , Disp: , Rfl:  glucose blood (BAYER CONTOUR TEST) test strip, Use as instructed, Disp: 100 each, Rfl: 12;  levothyroxine (SYNTHROID) 100 MCG tablet,  , Disp: , Rfl: ;  lithium 300 MG capsule, Take 300 mg by mouth 2 (two) times daily with a meal.  , Disp: , Rfl: ;  Multiple Vitamin (MULTIVITAMIN) tablet, Take 1 tablet by mouth daily.  , Disp: , Rfl: ;  ONE TOUCH LANCETS MISC, by Does not apply route.  , Disp: , Rfl:  atorvastatin (LIPITOR) 80 MG tablet, TAKE 1 TABLET DAILY, Disp: 90 tablet, Rfl: 3;  losartan (COZAAR) 50 MG tablet, Take 1 tablet (50 mg total) by mouth daily., Disp: 90 tablet, Rfl: 1;  metFORMIN (GLUCOPHAGE-XR) 500 MG 24 hr tablet, take 1 tablet by mouth every morning, Disp: 30 tablet, Rfl: 5;  NON FORMULARY, , Disp: , Rfl: ;  omeprazole (PRILOSEC) 40 MG capsule, TAKE 1 CAPSULE DAILY, Disp: 90 capsule, Rfl: 1 SYNTHROID 88 MCG tablet, TAKE 1 TABLET EVERY OTHER DAY (ON EVEN DAYS), Disp: 45 each, Rfl: 3  Allergies as of 05/03/2011 - Review Complete 05/03/2011  Allergen Reaction Noted  . Codeine    .  Metronidazole  03/26/2008  . Penicillins      1. Work and Family: She is still working part-time anywhere from 6-24 hours per week. 2. Activities: She keeps fairly busy. 3. Smoking, alcohol, or drugs: She still smokes  4. Primary Care Provider: Rene Paci, MD, MD  ROS: There are no other significant problems involving [place]'s other body systems.   Objective:  Vital Signs:  BP 140/86  Pulse 72  Wt 143 lb 6.4 oz (65.046 kg)   Ht Readings from Last 3 Encounters:  11/17/10 5\' 6"  (1.676 m)  09/07/10 5\' 7"  (1.702 m)  01/17/10 5\' 7"  (1.702 m)   Wt Readings from Last 3 Encounters:  05/03/11 143 lb 6.4 oz (65.046 kg)  01/23/11 145 lb 6.4 oz (65.953 kg)  11/17/10 144 lb 12.8 oz (65.681 kg)   PHYSICAL EXAM:  Constitutional: The patient appears healthy and well nourished.  The patient has lost 2 pounds since her last visit in June.  Face: The face appears normal.  Eyes: There is no obvious arcus  or proptosis. Moisture appears normal. Mouth: The oropharynx and tongue appear normal. Oral moisture is normal. Neck: The neck appears to be visibly normal. No carotid bruits are noted. The thyroid gland is  20+ grams in size. The right lobe was within normal for size. The left lobe is slightly prominent. The consistency of the thyroid gland is normal. The thyroid gland is not tender to palpation. Lungs: The lungs are clear to auscultation. Air movement is good. Heart: Heart rate and rhythm are regular.Heart sounds S1 and S2 are normal. I did not appreciate any pathologic cardiac murmurs. Abdomen: The abdomen appears to be normal in size. Bowel sounds are normal. There is no obvious hepatomegaly, splenomegaly, or other mass effect. She has about a 3 cm lipoma of her left posterior flank there Arms: Muscle size and bulk are normal for age. Hands: There is no obvious tremor. Phalangeal and metacarpophalangeal joints are normal. Palmar muscles are normal. Palmar skin is normal. Palmar moisture  is also normal. Legs: Muscles appear normal for age. No edema is present. Feet: Feet are normally formed. Dorsalis pedal pulses are normal 1+ bilaterally. No edema is present. Neurologic: Strength is normal for age in both the upper and lower extremities. Muscle tone is normal. Sensation to touch is normal in both the legs and feet.    LAB DATA: 01/23/11: TSH was 3.4 and 6. Free T4 was 1.12. Free T3 was 2.5. PTH was 33.2. Calcium was 10.0. 25-hydroxy vitamin D was 45. 1, 25-dihydroxy vitamin D was 39.                     Hemoglobin A1c today was 5.7%   Assessment and Plan:   ASSESSMENT:  1. Hypothyroid: The patient was mildly hypothyroid in June. I increased her thyroid hormone dose accordingly  2. Hashimoto's thyroiditis: Clinically quiescent  3. Hypertension: Her blood pressure is higher today. Since she doesn't want to take any more medication, in part because she can't afford it, she really needs to walk every day.   4. Prediabetes/diabetes: Treatment in this case is the same. She is currently on metformin. She is doing pretty well. She needs to eat right and exercise right. She may benefit from one of the DPP-4 inhibitors and/or exenatide (Byetta) or liraglutide (Victoza). 5.  Fatigue: She is really feeling very well. 6.  Hyperparathyroidism and vitamin D deficiency: She was doing well in January and continued to do well in June.   PLAN:  1. Diagnostic:  TFTs in 2 months. See orthopedics about her hip. 2. Therapeutic:  Eat Right, exercise right (walk 60 minutes per day), and take metformin. Stop smoking. 3. Patient education: We spent quite a bit of time reviewing our Eat Right Diet plan.  4. Follow-up: Return in about 4 months (around 09/02/2011).  Level of Service: This visit lasted in excess of 40 minutes. More than 50% of the visit was devoted to counseling.    David Stall, MD 07/02/2011 12:39 AM           2

## 2011-07-20 LAB — T4, FREE: Free T4: 1.05 ng/dL (ref 0.80–1.80)

## 2011-07-20 LAB — LITHIUM LEVEL: Lithium Lvl: 0.78 mEq/L — ABNORMAL LOW (ref 0.80–1.40)

## 2011-07-20 LAB — T3, FREE: T3, Free: 2.3 pg/mL (ref 2.3–4.2)

## 2011-08-22 ENCOUNTER — Telehealth: Payer: Self-pay | Admitting: Internal Medicine

## 2011-08-22 NOTE — Telephone Encounter (Signed)
I spoke with the patient and made her aware of Dr. Odessa Fleming recommendations.

## 2011-08-22 NOTE — Telephone Encounter (Signed)
On pradaxa for a-fib. No history of CVA noted. I will forward to Dr. Graciela Husbands for review.

## 2011-08-22 NOTE — Telephone Encounter (Signed)
Pt needs an ok to go off pradaxa for  3 days for dental work, pls call pt,

## 2011-08-22 NOTE — Telephone Encounter (Signed)
Reasonable thanks 

## 2011-09-04 ENCOUNTER — Ambulatory Visit: Payer: Medicare HMO | Admitting: "Endocrinology

## 2011-09-07 ENCOUNTER — Encounter: Payer: Medicare HMO | Admitting: *Deleted

## 2011-09-11 ENCOUNTER — Encounter: Payer: Self-pay | Admitting: *Deleted

## 2011-09-18 ENCOUNTER — Ambulatory Visit (INDEPENDENT_AMBULATORY_CARE_PROVIDER_SITE_OTHER): Payer: Medicare HMO | Admitting: *Deleted

## 2011-09-18 ENCOUNTER — Telehealth: Payer: Self-pay | Admitting: Internal Medicine

## 2011-09-18 ENCOUNTER — Encounter: Payer: Self-pay | Admitting: Internal Medicine

## 2011-09-18 DIAGNOSIS — I442 Atrioventricular block, complete: Secondary | ICD-10-CM

## 2011-09-18 NOTE — Telephone Encounter (Signed)
New Msg: Pt calling wanting to know if her remote device transmission was received. Please return pt call to discuss further.

## 2011-09-18 NOTE — Telephone Encounter (Signed)
Pt to resend transmission and give call back to see if was received.

## 2011-09-24 LAB — REMOTE PACEMAKER DEVICE
AL AMPLITUDE: 2.8 mv
AL IMPEDENCE PM: 664 Ohm
BATTERY VOLTAGE: 2.77 V
RV LEAD AMPLITUDE: 22.4 mv
VENTRICULAR PACING PM: 4

## 2011-09-25 ENCOUNTER — Other Ambulatory Visit: Payer: Self-pay | Admitting: Dermatology

## 2011-09-29 NOTE — Progress Notes (Signed)
PPM remote 

## 2011-10-03 ENCOUNTER — Telehealth: Payer: Self-pay

## 2011-10-03 ENCOUNTER — Telehealth: Payer: Self-pay | Admitting: Internal Medicine

## 2011-10-03 NOTE — Telephone Encounter (Signed)
As this supplement is not FDA approved, I cannot endorse its use-

## 2011-10-03 NOTE — Telephone Encounter (Signed)
Patient would like to know if she can take green tea capsules recommended by Dr. Neil Crouch in [place] to loose weight.

## 2011-10-03 NOTE — Telephone Encounter (Signed)
Pt called requesting MD's recommendation on if it would be safe for her to take "Green Coffee Bean Extract"  (dietary aid) with her current prescription medicines, please advise.

## 2011-10-03 NOTE — Telephone Encounter (Signed)
New msg Pt wants to talk to you about green coffee bean capsules to help her lose weight. Please call

## 2011-10-03 NOTE — Telephone Encounter (Signed)
Pt informed and expressed understanding. 

## 2011-10-04 ENCOUNTER — Encounter: Payer: Self-pay | Admitting: *Deleted

## 2011-10-05 NOTE — Telephone Encounter (Signed)
If the only content is green tea that should be fine thnaks

## 2011-10-05 NOTE — Telephone Encounter (Signed)
I am not sure about this. Will send to Dr. Graciela Husbands to review and see if he has an opinion on this.

## 2011-10-06 NOTE — Telephone Encounter (Signed)
The patient is aware of Dr. Odessa Fleming recommendations. I advised if any part of what she is taking appears to be a stimulant, she should not use it.

## 2011-10-25 ENCOUNTER — Encounter: Payer: Self-pay | Admitting: "Endocrinology

## 2011-10-25 ENCOUNTER — Ambulatory Visit (INDEPENDENT_AMBULATORY_CARE_PROVIDER_SITE_OTHER): Payer: Medicare HMO | Admitting: "Endocrinology

## 2011-10-25 VITALS — BP 147/80 | HR 67 | Wt 141.0 lb

## 2011-10-25 DIAGNOSIS — R5383 Other fatigue: Secondary | ICD-10-CM

## 2011-10-25 DIAGNOSIS — R251 Tremor, unspecified: Secondary | ICD-10-CM

## 2011-10-25 DIAGNOSIS — E038 Other specified hypothyroidism: Secondary | ICD-10-CM

## 2011-10-25 DIAGNOSIS — R7309 Other abnormal glucose: Secondary | ICD-10-CM

## 2011-10-25 DIAGNOSIS — E559 Vitamin D deficiency, unspecified: Secondary | ICD-10-CM

## 2011-10-25 DIAGNOSIS — R7303 Prediabetes: Secondary | ICD-10-CM

## 2011-10-25 DIAGNOSIS — R231 Pallor: Secondary | ICD-10-CM

## 2011-10-25 DIAGNOSIS — IMO0001 Reserved for inherently not codable concepts without codable children: Secondary | ICD-10-CM

## 2011-10-25 DIAGNOSIS — I10 Essential (primary) hypertension: Secondary | ICD-10-CM

## 2011-10-25 DIAGNOSIS — E211 Secondary hyperparathyroidism, not elsewhere classified: Secondary | ICD-10-CM

## 2011-10-25 DIAGNOSIS — R5381 Other malaise: Secondary | ICD-10-CM

## 2011-10-25 DIAGNOSIS — E063 Autoimmune thyroiditis: Secondary | ICD-10-CM

## 2011-10-25 DIAGNOSIS — R259 Unspecified abnormal involuntary movements: Secondary | ICD-10-CM

## 2011-10-25 LAB — POCT GLYCOSYLATED HEMOGLOBIN (HGB A1C): Hemoglobin A1C: 5.4

## 2011-10-25 NOTE — Progress Notes (Signed)
Subjective:  Patient Name: Stacy [name] Date of Birth: 12-03-1940  MRN: [id number]  Stacy [name]  presents to the office today for follow-up of  Her pre-diabetes/T2DM, hypothyroidism, thyroiditis, hypertension, fatigue, secondary hyperparathyroidism, vitamin D deficiency, tobacco abuse, and tremor.  HISTORY OF PRESENT ILLNESS:   Stacy [name] is a 71 y.o. Caucasian woman.  [place] was unaccompanied.  1. Patient was first referred to me on 09/04/05 for evaluation and management of chronic hypothyroidism and new hypercalcemia and hyperparathyroidism. In brief, she developed vitamin D deficiency, then probably secondary hyperparathyroidism, and then tertiary hyperparathyroidism. Her right inferior parathyroid gland was quite enlarged. She was taken to surgery on 12/07/05 for parathyroidectomy. Since that time she's done well, provided that she has continue to take her calcium and vitamin D.  She has had some waxing and waning of her thyroid gland size during these years, which is consistent with inflammatory flare-ups of her Hashimoto's disease. We've increased her thyroid hormone dose slightly over time. She now takes 100 mcg of Synthroid daily. She has remained euthyroid most of the time.  2. The patient has experienced many other problems during the last 5 years, to include bipolar disorder, GERD, fatigue, iron deficiency, 2 heart attacks, hypertension, orthostatic hypotension, atrial fibrillation, and the need for a cardiac pacemaker. We've helped to manage some of those problems and kept her as supplied as we could with samples of antihypertensive medications and Synthroid. She was diagnosed with type 2 diabetes in March. She was started on metformin 500 mg once daily. She was also started on atorvastatin for hyperlipidemia. 3. The patient's last [facility] visit was on 05/03/11. In the interim, she is still sometimes having issues with remembering. As she describes these episodes,  however, it seems that she really  has more problems paying attention. When she pays attention she seems to remember better. She had a squamous cell CA removed from her abdomen recently. She will have a follow-up visit re the cancer soon.  3. Pertinent Review of Systems:  Constitutional: The patient says, "I actually feel pretty good." Her energy level still varies. Eyes: Vision is good as long as she wears her glasses. There are no other significant eye complaints. Neck: The patient has no complaints of anterior neck swelling, soreness, tenderness,  pressure, discomfort, or difficulty swallowing.  Heart: Her pacemaker is still working. She has no complaints of palpitations, irregular heat beats, chest pain, or chest pressure. Gastrointestinal: Bowel movents seem normal most of the time, but she does have occasional diarrhea. The patient has no complaints of excessive hunger, acid reflux, upset stomach, stomach aches or pains, or constipation. Arms: She is having more pain in her right shoulder, which she attributes to arthritis.  Legs: Muscle mass and strength seem normal. She still has episodic pains in her knees, but her left hip pain has resolved. There are no complaints of numbness, tingling, or burning. No edema is noted. Feet: There are no obvious foot problems. There are no complaints of numbness, tingling, burning, or pain. No edema is noted. Hypoglycemia: None   PAST MEDICAL, FAMILY, AND SOCIAL HISTORY:  Past Medical History  Diagnosis Date  . Chronic bipolar disorder   . Coronary artery disease     s/p PTCA  . Complete AV block     s/p PPM--MEDTRONIC ADAPTA ADDr01  . Afib     ON PRADAXA  . Hypertension   . Hyperlipidemia   . Thyroid disease     HYPOTHROID  . Takotsubo syndrome   . GERD (  gastroesophageal reflux disease)   . Diverticulosis   . Tobacco abuse     COPD  . COPD (chronic obstructive pulmonary disease)     TOBACCO ABUSE  . H/O: hysterectomy   . Abscess of liver 08/11/2009  . Atrial fibrillation  11/11/2009  . Atrioventricular block, complete 03/26/2008  . BIPOLAR AFFECTIVE DISORDER 02/25/2008  . COLONIC POLYPS, HX OF 08/11/2009  . CORONARY ARTERY DISEASE, S/P PTCA 03/26/2008  . DEPRESSION 08/11/2009  . DIABETES MELLITUS, TYPE II 09/08/2010  . DIVERTICULOSIS, COLON 02/25/2008  . DYSLIPIDEMIA 02/25/2008  . HYPERTENSION 02/25/2008  . HYPOTHYROIDISM 02/25/2008  . LIPOMA 01/17/2010  . TOBACCO ABUSE 03/22/2009  . Pacemaker-Medtronic-dual-chamber 11/17/2010  . TROCHANTERIC BURSITIS, LEFT 11/08/2009  . Parathyroid related hypercalcemia   . Primary hyperparathyroidism   . Vitamin D deficiency disease   . Thyroiditis, autoimmune     Family History  Problem Relation Age of Onset  . Heart disease Mother   . Colon cancer      FH  . Diabetes Maternal Aunt     Current outpatient prescriptions:atorvastatin (LIPITOR) 80 MG tablet, TAKE 1 TABLET DAILY, Disp: 90 tablet, Rfl: 3;  Blood Glucose Monitoring Suppl (BAYER CONTOUR MONITOR) W/DEVICE KIT, 1 Device by Does not apply route as directed., Disp: 1 kit, Rfl: 0;  Calcium Carbonate (CALCIUM 500 PO), Take 1 tablet by mouth 2 (two) times daily.  , Disp: , Rfl: ;  dabigatran (PRADAXA) 150 MG CAPS, Take 150 mg by mouth every 12 (twelve) hours.  , Disp: , Rfl:  FLUoxetine (PROZAC) 20 MG capsule, Take 20 mg by mouth daily.  , Disp: , Rfl: ;  glucose blood (BAYER CONTOUR TEST) test strip, Use as instructed, Disp: 100 each, Rfl: 12;  levothyroxine (SYNTHROID) 100 MCG tablet,  , Disp: , Rfl: ;  lithium 300 MG capsule, Take 300 mg by mouth 2 (two) times daily with a meal.  , Disp: , Rfl: ;  losartan (COZAAR) 50 MG tablet, Take 1 tablet (50 mg total) by mouth daily., Disp: 90 tablet, Rfl: 1 metFORMIN (GLUCOPHAGE-XR) 500 MG 24 hr tablet, take 1 tablet by mouth every morning, Disp: 30 tablet, Rfl: 5;  Multiple Vitamin (MULTIVITAMIN) tablet, Take 1 tablet by mouth daily.  , Disp: , Rfl: ;  omeprazole (PRILOSEC) 40 MG capsule, TAKE 1 CAPSULE DAILY, Disp: 90 capsule, Rfl: 1;  ONE  TOUCH LANCETS MISC, by Does not apply route.  , Disp: , Rfl: ;  NON FORMULARY, , Disp: , Rfl:  SYNTHROID 88 MCG tablet, TAKE 1 TABLET EVERY OTHER DAY (ON EVEN DAYS), Disp: 45 each, Rfl: 3  Allergies as of 10/25/2011 - Review Complete 10/25/2011  Allergen Reaction Noted  . Codeine    . Metronidazole  03/26/2008  . Penicillins      1. Work and Family: She quit her part-time job. The requirement to lift heavy objects was aggravating her shoulder pain. 2. Activities: Since quitting her job she has been very sedentary.  3. Smoking, alcohol, or drugs: She still smokes  4. Primary Care Provider: Rene Paci, MD, MD  ROS: There are no other significant problems involving Kandiss's other body systems.   Objective:  Vital Signs:  BP 147/80  Pulse 67  Wt 141 lb (63.957 kg)   Ht Readings from Last 3 Encounters:  11/17/10 5\' 6"  (1.676 m)  09/07/10 5\' 7"  (1.702 m)  01/17/10 5\' 7"  (1.702 m)   Wt Readings from Last 3 Encounters:  10/25/11 141 lb (63.957 kg)  05/03/11 143 lb 6.4  oz (65.046 kg)  01/23/11 145 lb 6.4 oz (65.953 kg)   PHYSICAL EXAM:  Constitutional: The patient appears healthy and well nourished.  The patient has lost another 2 pounds since her last visit in September.  Head: She has a noticeable 1-2+ head tremor, which is worse than it's been since I've been seeing her.  Face: The face appears normal.  Eyes: There is no obvious arcus or proptosis. Moisture appears normal. Mouth: The oropharynx and tongue appear normal. Oral moisture is normal. Neck: The neck appears to be visibly normal. No carotid bruits are noted. She has a low-lying thyroid gland. The thyroid gland is probably 20+ grams in size. The right lobe is within normal for size. The left lobe is slightly prominent. The consistency of the thyroid gland is normal. The thyroid gland is not tender to palpation. Lungs: The lungs are clear to auscultation. Air movement is good. Heart: Heart rate and rhythm are  regular. Heart sounds S1 and S2 are normal. I did not appreciate any pathologic cardiac murmurs. Abdomen: The abdomen appears to be normal in size. Bowel sounds are normal. There is no obvious hepatomegaly, splenomegaly, or other mass effect.  Arms: Muscle size and bulk are normal for age. She has a slight cog-wheeling of her arms when I passively flex and extend her arms at the elbows. Hands: She has a 1-2+ hand tremor. Phalangeal and metacarpophalangeal joints are normal. Palmar muscles are normal. Palmar skin is normal. Palmar moisture is also normal. Fingernails are somewhat pale. Legs: Muscles appear normal for age. No edema is present. Neurologic: Strength is normal for age in both the upper and lower extremities. Muscle tone is normal. Sensation to touch is normal in both the legs and feet. She walked normally, without any of the hesitation and rigidity usually seen with Parkinson's disease.   LAB DATA: 05/02/12: TSH was 1.821. Free T4 was 1.05. Free T3 was 2.3. Lithium level was 0.78 (0.80-1.40).                              Hemoglobin A1c today was 5.4%, compared to 5.7% at last visit.   Assessment and Plan:   ASSESSMENT:  1. Hypothyroid: The patient was mildly hypothyroid in June, but after her Synthroid dose was increased to 100 mcg daily, she was euthyroid in September. 2. Hashimoto's thyroiditis: Clinically quiescent  3. Hypertension: Her systolic blood pressure is slightly higher today, but her diastolic BP is slightly lower. A daily walk for at least 30 minutes would be beneficial.  4. T2DM/Prediabetes:  She is doing well on metformin. Her HbA1c is well within normal limits now.  5.  Fatigue: She is really feeling very well. 6.  Hyperparathyroidism and vitamin D deficiency: She was doing well in January 2012 and continued to do well in June. It's time to repeat her lab tests. 7. Tobacco abuse: She needs to stop smoking, but is not motivated to so at this time. 8. Tremor: Although  it's possible that her tremor is due to psych meds, the cog-wheeling raises the suspicion for Parkinson's disease. 9. Pallor: Need to rule out iron deficiency and anemia.  10. Hyperparathyroid and vitamin D deficiency: Need to repeat labs.   PLAN:  1. Diagnostic:  PTH, calcium, 25-hydroxy vitamin D, CBC, and serum iron today. Repeat TFTs in 5 months. 2. Therapeutic:  Eat Right, exercise right (walk 30-60 minutes per day), and take metformin. Stop smoking. 3. Patient  education: We talked a lot about her tremor. I asked her to talk with her psychiatrist to see if any of her medications need to be adjusted or stopped. The patient may require a neurology consultation.  4. Follow-up: 5 months  Level of Service: This visit lasted in excess of 40 minutes. More than 50% of the visit was devoted to counseling.   David Stall, MD 10/25/2011 1:31 PM           2

## 2011-10-25 NOTE — Patient Instructions (Signed)
Follow up visit in 5 months.  

## 2011-10-26 LAB — VITAMIN D 25 HYDROXY (VIT D DEFICIENCY, FRACTURES): Vit D, 25-Hydroxy: 43 ng/mL (ref 30–89)

## 2011-10-26 LAB — CBC
MCV: 97.5 fL (ref 78.0–100.0)
Platelets: 220 10*3/uL (ref 150–400)
RBC: 4.81 MIL/uL (ref 3.87–5.11)
WBC: 7.8 10*3/uL (ref 4.0–10.5)

## 2011-10-26 LAB — IRON: Iron: 71 ug/dL (ref 42–145)

## 2011-10-26 LAB — PTH, INTACT AND CALCIUM
Calcium, Total (PTH): 10.4 mg/dL (ref 8.4–10.5)
PTH: 30.8 pg/mL (ref 14.0–72.0)

## 2011-11-02 ENCOUNTER — Other Ambulatory Visit: Payer: Self-pay

## 2011-11-08 ENCOUNTER — Encounter: Payer: Self-pay | Admitting: Neurology

## 2011-11-08 ENCOUNTER — Encounter: Payer: Self-pay | Admitting: Internal Medicine

## 2011-11-08 ENCOUNTER — Ambulatory Visit (INDEPENDENT_AMBULATORY_CARE_PROVIDER_SITE_OTHER): Payer: Medicare HMO | Admitting: Internal Medicine

## 2011-11-08 VITALS — BP 118/82 | HR 80 | Temp 97.4°F | Ht 66.0 in | Wt 139.4 lb

## 2011-11-08 DIAGNOSIS — E785 Hyperlipidemia, unspecified: Secondary | ICD-10-CM

## 2011-11-08 DIAGNOSIS — R259 Unspecified abnormal involuntary movements: Secondary | ICD-10-CM

## 2011-11-08 DIAGNOSIS — E119 Type 2 diabetes mellitus without complications: Secondary | ICD-10-CM

## 2011-11-08 DIAGNOSIS — E039 Hypothyroidism, unspecified: Secondary | ICD-10-CM

## 2011-11-08 NOTE — Assessment & Plan Note (Signed)
Dx 04/2010 on incidental labs - on metformin since then Well controlled by hx and labs Check a1c q3-44mo and adjust tx as needed Lab Results  Component Value Date   HGBA1C 5.4 10/25/2011

## 2011-11-08 NOTE — Assessment & Plan Note (Signed)
Prior neuro eval 2011 felt related to Lithium use (consult reviewed) dtr requests second opinion on same Refer done

## 2011-11-08 NOTE — Assessment & Plan Note (Signed)
On atorva for same - plan recheck lipids when fasting

## 2011-11-08 NOTE — Progress Notes (Signed)
Subjective:    Patient ID: Stacy [name], female    DOB: 1941-02-18, 71 y.o.   MRN: [id number]  HPI  here for followup - last seen >48mo ago   hypothyroidism - follows with endo - no recent dose changes in thyroid replacment - no skin or weight changes   bipolar dz with depression - Li levels monitored periodically - no dose adjustments or changes -symptoms well controlled - follows with psyc Nature conservation officer) several times each year and as needed - does note long standing shaking of hands and arms, most notable in AM but not every AM - ?getting worse - has seen neuro for same 2011 and felt related to Dierdre Searles - dtr requests 2nd opinion on same   dyslipidemia - on statin -reports compliance with ongoing medical treatment and no changes in medication dose or frequency. denies adverse side effects related to current therapy. no myalgia   AF - on pradaxa for anticoag = follows with cards for same   CAD issues - reviewed hx - reports compliance with ongoing medical treatment and no changes in medication dose or frequency. denies adverse side effects related to current therapy.    diabetes mellitus 2 - noted on incidental labs 06/2010 at psyc - started on metformin for same - the patient reports compliance with medication(s) as prescribed. Denies adverse side effects.  Past Medical History  Diagnosis Date  . Chronic bipolar disorder   . Coronary artery disease     s/p PTCA  . Complete AV block     s/p PPM--MEDTRONIC ADAPTA ADDr01  . Takotsubo syndrome   . GERD (gastroesophageal reflux disease)   . COPD (chronic obstructive pulmonary disease)     TOBACCO ABUSE  . Abscess of liver   . Atrial fibrillation     chronic anticoag - pradaxa  . BIPOLAR AFFECTIVE DISORDER   . DEPRESSION   . DIABETES MELLITUS, TYPE II dx 04/2010  . DYSLIPIDEMIA   . HYPERTENSION   . HYPOTHYROIDISM   . TOBACCO ABUSE   . Pacemaker-Medtronic-dual-chamber   . Parathyroid related hypercalcemia   . Primary  hyperparathyroidism   . Vitamin D deficiency disease      Review of Systems  Constitutional: Negative for fever and unexpected weight change.  Neurological: Positive for tremors. Negative for syncope and weakness.       Objective:   Physical Exam BP 118/82  Pulse 80  Temp(Src) 97.4 F (36.3 C) (Oral)  Ht 5\' 6"  (1.676 m)  Wt 139 lb 6.4 oz (63.231 kg)  BMI 22.50 kg/m2  SpO2 97% Wt Readings from Last 3 Encounters:  11/08/11 139 lb 6.4 oz (63.231 kg)  10/25/11 141 lb (63.957 kg)  05/03/11 143 lb 6.4 oz (65.046 kg)   Constitutional: Stacy [name] appears well-developed and well-nourished. No distress. dtr at side HENT: Head: Normocephalic and atraumatic. Ears: B TMs ok, no erythema or effusion; Nose: Nose normal. Mouth/Throat: Oropharynx is clear and moist. No oropharyngeal exudate.  Eyes: Conjunctivae and EOM are normal. Pupils are equal, round, and reactive to light. No scleral icterus.  Neck: Normal range of motion. Neck supple. No JVD present. No thyromegaly present.  Cardiovascular: Normal rate, regular rhythm and normal heart sounds.  No murmur heard. No BLE edema. Pulmonary/Chest: Effort normal and breath sounds normal. No respiratory distress. Stacy [name] has no wheezes.  Neurological: Stacy [name] is alert and oriented to person, place, and time. No cranial nerve deficit. Coordination normal. tremor BUE, intention>rest Psychiatric: Stacy [name] has a normal mood and affect. Her behavior  is normal. Judgment and thought content normal.   Lab Results  Component Value Date   WBC 7.8 10/25/2011   HGB 14.9 10/25/2011   HCT 46.9* 10/25/2011   PLT 220 10/25/2011   GLUCOSE 112 06/24/2010   CHOL 128 09/07/2010   TRIG 152.0* 09/07/2010   HDL 50.20 09/07/2010   LDLDIRECT 77.5 08/11/2009   LDLCALC 47 09/07/2010   ALT 20 09/07/2010   AST 19 09/07/2010   NA 138 06/24/2010   K 4.9 06/24/2010   CL 104 06/24/2010   CREATININE 0.87 06/24/2010   BUN 12 06/24/2010   CO2 27 06/24/2010   TSH 1.821 05/03/2011   INR 2.0 10/21/2009    HGBA1C 5.4 10/25/2011       Assessment & Plan:  See problem list. Medications and labs reviewed today.

## 2011-11-08 NOTE — Patient Instructions (Addendum)
It was good to see you today. We have reviewed your prior records including labs and tests today Medications reviewed, no changes at this time. we'll make referral to neurology for second opinion about tremor. Our office will contact you regarding appointment(s) once made. Please schedule followup in 3-4 months for diabetes and cholesterol check, call sooner if problems. Please come fasting in preparation for blood work at that visit

## 2011-11-08 NOTE — Assessment & Plan Note (Signed)
History of thyroiditis, on hormone replacement therapy check every 6 months and adjust tx as needed Lab Results  Component Value Date   TSH 1.821 05/03/2011

## 2011-11-09 ENCOUNTER — Other Ambulatory Visit: Payer: Self-pay

## 2011-11-09 MED ORDER — LOSARTAN POTASSIUM 50 MG PO TABS: 50.0000 mg | ORAL_TABLET | Freq: Every day | ORAL | Status: DC

## 2011-11-14 ENCOUNTER — Encounter: Payer: Self-pay | Admitting: Internal Medicine

## 2011-11-14 ENCOUNTER — Ambulatory Visit (INDEPENDENT_AMBULATORY_CARE_PROVIDER_SITE_OTHER): Payer: Medicare HMO | Admitting: Internal Medicine

## 2011-11-14 VITALS — BP 110/68 | HR 69 | Ht 66.0 in | Wt 138.8 lb

## 2011-11-14 DIAGNOSIS — I442 Atrioventricular block, complete: Secondary | ICD-10-CM

## 2011-11-14 DIAGNOSIS — I10 Essential (primary) hypertension: Secondary | ICD-10-CM

## 2011-11-14 DIAGNOSIS — I4891 Unspecified atrial fibrillation: Secondary | ICD-10-CM

## 2011-11-14 DIAGNOSIS — Z950 Presence of cardiac pacemaker: Secondary | ICD-10-CM

## 2011-11-14 DIAGNOSIS — I259 Chronic ischemic heart disease, unspecified: Secondary | ICD-10-CM

## 2011-11-14 LAB — PACEMAKER DEVICE OBSERVATION
AL AMPLITUDE: 5.6 mv
AL IMPEDENCE PM: 652 Ohm
BATTERY VOLTAGE: 2.77 V
RV LEAD AMPLITUDE: 22.4 mv
RV LEAD IMPEDENCE PM: 1232 Ohm
VENTRICULAR PACING PM: 4

## 2011-11-14 NOTE — Assessment & Plan Note (Signed)
./  dfn The patient's device was interrogated.  The information was reviewed. No changes were made in the programming.    

## 2011-11-14 NOTE — Assessment & Plan Note (Signed)
Blood pressure well controlled

## 2011-11-14 NOTE — Assessment & Plan Note (Signed)
Frequent episodes on dabigitran

## 2011-11-14 NOTE — Assessment & Plan Note (Signed)
stable °

## 2011-11-14 NOTE — Assessment & Plan Note (Signed)
Stable on current meds 

## 2011-11-14 NOTE — Patient Instructions (Signed)

## 2011-11-14 NOTE — Progress Notes (Signed)
HPI  Stacy [name] is a 71 y.o. female seen in followup for  bradycardia and  intermittent complete heart block.  She is status post pacemaker implantation.  She has a history of coronary disease and has had problems with recurrent chest pain. We undertook Myoview scan in the 2010. She then underwent catheterization demonstrating no obstruction; her previously implanted stent was patent her ejection fraction was normal  The patient denies chest pain, shortness of breath, nocturnal dyspnea, orthopnea or peripheral edema.  There have been no palpitations, lightheadedness or syncope.   She is scheduled to see nuerology and psychiatry for some unusual symptoms   Past Medical History  Diagnosis Date  . Chronic bipolar disorder   . Coronary artery disease     s/p PTCA  . Complete AV block     s/p PPM--MEDTRONIC ADAPTA ADDr01  . Takotsubo syndrome   . GERD (gastroesophageal reflux disease)   . COPD (chronic obstructive pulmonary disease)     TOBACCO ABUSE  . Abscess of liver   . Atrial fibrillation     chronic anticoag - pradaxa  . BIPOLAR AFFECTIVE DISORDER   . DEPRESSION   . DIABETES MELLITUS, TYPE II dx 04/2010  . DYSLIPIDEMIA   . HYPERTENSION   . HYPOTHYROIDISM   . TOBACCO ABUSE   . Pacemaker-Medtronic-dual-chamber   . Parathyroid related hypercalcemia   . Primary hyperparathyroidism   . Vitamin D deficiency disease     Past Surgical History  Procedure Date  . Insert / replace / remove pacemaker     MEDTRONIC ADAPTA ADDr01  . Parathyroidectomy     RIGHT INFERIOR  . Abdominal hysterectomy     Current Outpatient Prescriptions  Medication Sig Dispense Refill  . atorvastatin (LIPITOR) 80 MG tablet TAKE 1 TABLET DAILY  90 tablet  3  . Blood Glucose Monitoring Suppl (BAYER CONTOUR MONITOR) W/DEVICE KIT 1 Device by Does not apply route as directed.  1 kit  0  . Calcium Carbonate (CALCIUM 500 PO) Take 1 tablet by mouth 2 (two) times daily.        . dabigatran (PRADAXA)  150 MG CAPS Take 150 mg by mouth every 12 (twelve) hours.        Marland Kitchen FLUoxetine (PROZAC) 20 MG capsule Take 20 mg by mouth daily.        Marland Kitchen glucose blood (BAYER CONTOUR TEST) test strip Use as instructed  100 each  12  . levothyroxine (SYNTHROID) 100 MCG tablet Take 100 mcg by mouth daily.       Marland Kitchen lithium 300 MG capsule Take 300 mg by mouth 2 (two) times daily with a meal.        . losartan (COZAAR) 50 MG tablet Take 1 tablet (50 mg total) by mouth daily.  90 tablet  1  . metFORMIN (GLUCOPHAGE-XR) 500 MG 24 hr tablet take 1 tablet by mouth every morning  30 tablet  5  . Multiple Vitamin (MULTIVITAMIN) tablet Take 1 tablet by mouth daily.        Marland Kitchen omeprazole (PRILOSEC) 40 MG capsule TAKE 1 CAPSULE DAILY  90 capsule  1  . ONE TOUCH LANCETS MISC by Does not apply route.          Allergies  Allergen Reactions  . Codeine     REACTION: vomiting  . Metronidazole   . Penicillins     REACTION: throat swelling, rash    Review of Systems negative except from HPI and PMH  Physical Exam BP 110/68  Pulse 69  Ht 5\' 6"  (1.676 m)  Wt 138 lb 12.8 oz (62.959 kg)  BMI 22.40 kg/m2 Well developed and well nourished in no acute distress HENT normal E scleral and icterus clear Neck Supple JVP flat;   Clear to ausculation Regular rate and rhythm, no murmurs +S4 Soft with active bowel sounds No clubbing cyanosis none Edema Alert and oriented, grossly normal motor and sensory function Skin Warm and Dry   Assessment and  Plan

## 2011-11-20 ENCOUNTER — Other Ambulatory Visit: Payer: Self-pay | Admitting: Internal Medicine

## 2011-12-11 ENCOUNTER — Encounter: Payer: Self-pay | Admitting: Neurology

## 2011-12-11 ENCOUNTER — Other Ambulatory Visit (INDEPENDENT_AMBULATORY_CARE_PROVIDER_SITE_OTHER): Payer: Medicare HMO

## 2011-12-11 ENCOUNTER — Ambulatory Visit (INDEPENDENT_AMBULATORY_CARE_PROVIDER_SITE_OTHER): Payer: Medicare HMO | Admitting: Neurology

## 2011-12-11 VITALS — BP 118/68 | HR 76 | Wt 137.0 lb

## 2011-12-11 DIAGNOSIS — R259 Unspecified abnormal involuntary movements: Secondary | ICD-10-CM

## 2011-12-11 DIAGNOSIS — R251 Tremor, unspecified: Secondary | ICD-10-CM

## 2011-12-11 LAB — BASIC METABOLIC PANEL
BUN: 15 mg/dL (ref 6–23)
CO2: 28 mEq/L (ref 19–32)
Chloride: 103 mEq/L (ref 96–112)
Creatinine, Ser: 0.9 mg/dL (ref 0.4–1.2)
Glucose, Bld: 105 mg/dL — ABNORMAL HIGH (ref 70–99)
Potassium: 4.4 mEq/L (ref 3.5–5.1)

## 2011-12-11 NOTE — Patient Instructions (Addendum)
Your CTA head and neck is scheduled for Wednesday, May 8th at 11:00am.  Please arrive to [facility] by 10:45am.  First floor admitting.  No solid food 4 hours prior to scans.  Liquids and medications are fine.  [phone].  We will see you back on August 6th. At 10:00am.

## 2011-12-11 NOTE — Progress Notes (Signed)
Valinda Hoar.    Dear Dr. Felicity Coyer,  Thank you for having me see Stacy [name] in consultation today at [facility] for her problem with tremor.  As you may recall, she is a 71 y.o. year old female with a history of bipolar disease on chronic lithium, ischemic left hemisphere stroke of presumed cardiac etiology who presents with a multi year history of tremor.  She says it interferes with her handwriting and drinking coffee.  It is worse in the am.  It also involves her head.  She has not noticed any changes in dexterity or stiffness.  She does stumble when she walks at times.  She drinks 3-4 cups of caffeine in the am.  The tremor also involves her head at times.  She does not drink alcohol so she cannot endorse whether this improves it.  She has been on lithium since the 80s for her bipolar disorder.  She has not tried a medication for her tremor.  She has also had a history of stroke that effected her right side, although she does not remember this.  It occurred during a procedure involving her pacemaker.  She is hypothyroid, but had her TSH checked recently by her endocrinologist and it was normal.  Past Medical History  Diagnosis Date  . Chronic bipolar disorder   . Coronary artery disease     s/p PTCA  . Complete AV block     s/p PPM--MEDTR[employer]IC ADAPTA ADDr01  . Takotsubo syndrome   . GERD (gastroesophageal reflux disease)   . COPD (chronic obstructive pulmonary disease)     TOBACCO ABUSE  . Abscess of liver   . Atrial fibrillation     chronic anticoag - pradaxa  . BIPOLAR AFFECTIVE DISORDER   . DEPRESSION   . DIABETES MELLITUS, TYPE II dx 04/2010  . DYSLIPIDEMIA   . HYPERTENSION   . HYPOTHYROIDISM   . TOBACCO ABUSE   . Pacemaker-Medtronic-dual-chamber   . Parathyroid related hypercalcemia   . Primary hyperparathyroidism   . Vitamin D deficiency disease     Past Surgical History  Procedure Date  . Insert / replace / remove pacemaker     MEDTRONIC ADAPTA  ADDr01  . Parathyroidectomy     RIGHT INFERIOR  . Abdominal hysterectomy     History   Social History  . Marital Status: Widowed    Spouse Name: N/A    Number of Children: 2  . Years of Education: N/A   Occupational History  .     Social History Main Topics  . Smoking status: Current Everyday Smoker -- 1.0 packs/day for 48 years    Types: Cigarettes  . Smokeless tobacco: Never Used   Comment: Widowed- 2 girls. Lives with daughter (who is nurse at [employer])  . Alcohol Use: No  . Drug Use: No  . Sexually Active: None   Other Topics Concern  . None   Social History Narrative   WIDOWED2 DAUGHTERSLIVES W/DAUGHTER ( WHO IS A NURSE @ [employer])CURRENTLY SMOKESNO ALCOHOL USENO ILLICIT DRUG USEDAILY CAFFEINE USEPPM-MEDTRONICPATIENT SIGNED A DESIGNATED PARTY RELEASE TO ALLOW DAUGHTER, NICOLE COX, TO HAVE ACCESS TO HER MEDICAL RECORDS/INFORMATION. Daphane Shepherd, November 09, 2009 9:19 AM    Family History  Problem Relation Age of Onset  . Heart disease Mother   . Colon cancer      FH  . Diabetes Maternal Aunt   - no family history of tremor  Current Outpatient Prescriptions on File Prior to Visit  Medication Sig Dispense Refill  . atorvastatin (LIPITOR) 80 MG tablet TAKE 1 TABLET DAILY  90 tablet  3  . Blood Glucose Monitoring Suppl (BAYER CONTOUR MONITOR) W/DEVICE KIT 1 Device by Does not apply route as directed.  1 kit  0  . Calcium Carbonate (CALCIUM 500 PO) Take 1 tablet by mouth 2 (two) times daily.        . dabigatran (PRADAXA) 150 MG CAPS Take 150 mg by mouth every 12 (twelve) hours.        Marland Kitchen FLUoxetine (PROZAC) 20 MG capsule Take 20 mg by mouth daily.        Marland Kitchen glucose blood (BAYER CONTOUR TEST) test strip Use as instructed  100 each  12  . levothyroxine (SYNTHROID) 100 MCG tablet Take 100 mcg by mouth daily.       Marland Kitchen lithium 300 MG capsule Take 300 mg by mouth 2 (two) times daily with a meal.        . losartan (COZAAR) 50 MG tablet Take 1 tablet (50 mg total) by mouth  daily.  90 tablet  1  . metFORMIN (GLUCOPHAGE-XR) 500 MG 24 hr tablet take 1 tablet by mouth every morning  30 tablet  5  . Multiple Vitamin (MULTIVITAMIN) tablet Take 1 tablet by mouth daily.        Marland Kitchen omeprazole (PRILOSEC) 40 MG capsule TAKE 1 CAPSULE DAILY  90 capsule  1  . ONE TOUCH LANCETS MISC by Does not apply route.          Allergies  Allergen Reactions  . Codeine     REACTION: vomiting  . Metronidazole   . Penicillins     REACTION: throat swelling, rash      ROS:  13 systems were reviewed and are notable for problems with weakness in the arms and legs and problems with memory..  All other review of systems are unremarkable.   Examination:  Filed Vitals:   12/11/11 0927  BP: 118/68  Pulse: 76  Weight: 137 lb (62.143 kg)     In general, thin appearing women.  Cardiovascular: The patient has an irregularly irregular.  Fundoscopy:  Disks are flat. Vessel caliber within normal limits.  Mental status:   The patient is oriented to person, place and time. Recent and remote memory are intact. Attention span and concentration are normal. Language including repetition, naming, following commands are intact. Fund of knowledge of current and historical events, as well as vocabulary are normal.  Cranial Nerves: Pupils are equally round and reactive to light. Visual fields full to confrontation. Extraocular movements are intact without nystagmus. Facial sensation and muscles of mastication are intact. Muscles of facial expression are symmetric although slightly blunted with decreased blink frequency. Hearing intact to bilateral finger rub. Tongue protrusion, uvula, palate midline.  Shoulder shrug intact  Motor:  The patient has normal bulk and but some subtle cogwheeling in her left wrist, with a small amount of bradykinesia in the left hand.  Bilateral fine postural tremor with no resting component, worse on the left.    5/5 muscle strength bilaterally.  Reflexes:    Biceps  Triceps Brachioradialis Knee Ankle  Right 3+  3+  3+   2+ 2+  Left  2+  2+  2+   2+ 2+  Toes down  Coordination:  Normal finger to nose(except terminal shakiness)  No dysdiadokinesia.  Sensation is intact to temperature and vibration symmetrically, although some loss of vibration at the toes, with normal position sense.  Gait and Station are normal.   Romberg is mildly unsteady.   Impression/Recs: 1.  Postural tremor - She has some subtle signs of parkinsonism, but this is in the spectrum of essential tremor/enhanced physiologic tremor.  I think Lithium has likely cause most of her tremor, but its diurnal variation suggests that her considerable coffee intake is contributing.  She is going to try to decrease this to see if it helps.  She is also going to talk to her psychiatrist about whether lithium could be changed, but she is reluctant to do this.  If the decrease in the caffeine does not help, and she can't stop lithium I would likely start her on primidone. 2.  Ischemic stroke - I am going to get a CTA head and neck because of this history of ischemic stroke, for which I can't find her workup.  She is on Pradaxa and has had no more events so I would not advocate any change in her medical therapy.   We will see the patient back in 3 months.  Thank you for having [place] see Stacy [name] in consultation.  Feel free to contact me with any questions.  Lupita Raider Modesto Charon, MD [facility], [place] [address] [place], [place] [address] Phone: [phone] Fax: [phone].

## 2011-12-12 ENCOUNTER — Other Ambulatory Visit: Payer: Self-pay

## 2011-12-12 MED ORDER — OMEPRAZOLE 40 MG PO CPDR: 40.0000 mg | DELAYED_RELEASE_CAPSULE | Freq: Every day | ORAL | Status: DC

## 2011-12-13 ENCOUNTER — Other Ambulatory Visit (HOSPITAL_COMMUNITY): Payer: Medicare HMO

## 2011-12-14 ENCOUNTER — Ambulatory Visit (HOSPITAL_COMMUNITY)
Admission: RE | Admit: 2011-12-14 | Discharge: 2011-12-14 | Disposition: A | Discharge status: Home / Self Care | Payer: Medicare HMO | Source: Ambulatory Visit | Attending: Neurology | Admitting: Neurology

## 2011-12-14 ENCOUNTER — Inpatient Hospital Stay (HOSPITAL_COMMUNITY): Admission: RE | Admit: 2011-12-14 | Payer: Medicare HMO | Source: Ambulatory Visit

## 2011-12-14 DIAGNOSIS — R251 Tremor, unspecified: Secondary | ICD-10-CM

## 2011-12-14 DIAGNOSIS — M47812 Spondylosis without myelopathy or radiculopathy, cervical region: Secondary | ICD-10-CM | POA: Insufficient documentation

## 2011-12-14 DIAGNOSIS — Z7901 Long term (current) use of anticoagulants: Secondary | ICD-10-CM | POA: Insufficient documentation

## 2011-12-14 DIAGNOSIS — Z8673 Personal history of transient ischemic attack (TIA), and cerebral infarction without residual deficits: Secondary | ICD-10-CM | POA: Insufficient documentation

## 2011-12-14 DIAGNOSIS — R259 Unspecified abnormal involuntary movements: Secondary | ICD-10-CM | POA: Insufficient documentation

## 2011-12-14 MED ORDER — IOHEXOL 350 MG/ML SOLN
50.0000 mL | Freq: Once | INTRAVENOUS | Status: AC | PRN
  Administered 2011-12-14: 50 mL via INTRAVENOUS

## 2011-12-21 ENCOUNTER — Telehealth: Payer: Self-pay | Admitting: Neurology

## 2011-12-21 NOTE — Telephone Encounter (Signed)
Message copied by Benay Spice on Thu Dec 21, 2011  3:50 PM ------      Message from: Milas Gain      Created: Thu Dec 21, 2011  2:14 PM       Let Ms. Carignan know her blood vessels in her head and neck looked good.

## 2011-12-21 NOTE — Telephone Encounter (Signed)
Called and spoke with the patient. Information given as per Dr. Modesto Charon below. No other concerns/issues voiced at this time.

## 2012-01-18 ENCOUNTER — Other Ambulatory Visit: Payer: Self-pay | Admitting: *Deleted

## 2012-01-18 DIAGNOSIS — E039 Hypothyroidism, unspecified: Secondary | ICD-10-CM

## 2012-01-18 MED ORDER — LEVOTHYROXINE SODIUM 100 MCG PO TABS: 100.0000 ug | ORAL_TABLET | Freq: Every day | ORAL | Status: DC

## 2012-02-15 ENCOUNTER — Encounter: Payer: Medicare HMO | Admitting: *Deleted

## 2012-02-23 ENCOUNTER — Other Ambulatory Visit: Payer: Self-pay

## 2012-02-23 ENCOUNTER — Encounter: Payer: Self-pay | Admitting: *Deleted

## 2012-02-23 MED ORDER — OMEPRAZOLE 40 MG PO CPDR: 40.0000 mg | DELAYED_RELEASE_CAPSULE | Freq: Every day | ORAL | Status: DC

## 2012-02-26 ENCOUNTER — Other Ambulatory Visit: Payer: Self-pay | Admitting: *Deleted

## 2012-02-26 MED ORDER — OMEPRAZOLE 40 MG PO CPDR: 40.0000 mg | DELAYED_RELEASE_CAPSULE | Freq: Every day | ORAL | Status: DC

## 2012-02-27 ENCOUNTER — Other Ambulatory Visit: Payer: Self-pay | Admitting: *Deleted

## 2012-02-27 NOTE — Telephone Encounter (Signed)
Refill for omeprazole already been done... 02/27/12@10 :41am/LMB

## 2012-03-01 ENCOUNTER — Other Ambulatory Visit: Payer: Self-pay | Admitting: *Deleted

## 2012-03-01 ENCOUNTER — Encounter: Payer: Self-pay | Admitting: Internal Medicine

## 2012-03-01 ENCOUNTER — Ambulatory Visit (INDEPENDENT_AMBULATORY_CARE_PROVIDER_SITE_OTHER): Payer: Medicare HMO | Admitting: *Deleted

## 2012-03-01 DIAGNOSIS — IMO0001 Reserved for inherently not codable concepts without codable children: Secondary | ICD-10-CM

## 2012-03-01 DIAGNOSIS — I442 Atrioventricular block, complete: Secondary | ICD-10-CM

## 2012-03-04 LAB — REMOTE PACEMAKER DEVICE
AL AMPLITUDE: 2.8 mv
AL IMPEDENCE PM: 590 Ohm
AL THRESHOLD: 0.625 V
RV LEAD AMPLITUDE: 11.2 mv
RV LEAD IMPEDENCE PM: 1235 Ohm

## 2012-03-06 ENCOUNTER — Other Ambulatory Visit: Payer: Self-pay | Admitting: *Deleted

## 2012-03-06 ENCOUNTER — Other Ambulatory Visit: Payer: Self-pay

## 2012-03-06 MED ORDER — ATORVASTATIN CALCIUM 80 MG PO TABS: 80.0000 mg | ORAL_TABLET | Freq: Every day | ORAL | Status: DC

## 2012-03-12 ENCOUNTER — Ambulatory Visit (INDEPENDENT_AMBULATORY_CARE_PROVIDER_SITE_OTHER): Payer: Medicare HMO | Admitting: Neurology

## 2012-03-12 ENCOUNTER — Encounter: Payer: Self-pay | Admitting: Neurology

## 2012-03-12 VITALS — BP 116/72 | HR 88 | Wt 134.0 lb

## 2012-03-12 DIAGNOSIS — R259 Unspecified abnormal involuntary movements: Secondary | ICD-10-CM

## 2012-03-12 NOTE — Progress Notes (Signed)
Dear Dr. Felicity [name],  I saw  Stacy [name] back in [facility] for her problem with essential/pharmacologic induced tremor.  As you may recall, she is a 71 y.o. year old female with a history of bipolar disorder, complete heart block s/p pacemaker placement, left hemispheric ischemic stroke and atrial fibrillation who I saw for the first time at her last visit for her tremor.  At that visit, I thought her tremor was likely exacerbated by caffeine use as well as the use of lithium.  She talked to her psychiatrist about coming off the lithium, but her psychiatrist did not think this was optimal.  In addition, she has decreased her caffeine intake with no benefit.  She is interested in treatment today.    Medical history, social history, and family history were reviewed and have not changed since the last clinic visit.  Current Outpatient Prescriptions on File Prior to Visit  Medication Sig Dispense Refill  . atorvastatin (LIPITOR) 80 MG tablet Take 1 tablet (80 mg total) by mouth daily.  90 tablet  1  . Blood Glucose Monitoring Suppl (BAYER CONTOUR MONITOR) W/DEVICE KIT 1 Device by Does not apply route as directed.  1 kit  0  . Calcium Carbonate (CALCIUM 500 PO) Take 1 tablet by mouth 2 (two) times daily.        . dabigatran (PRADAXA) 150 MG CAPS Take 150 mg by mouth every 12 (twelve) hours.        Marland Kitchen FLUoxetine (PROZAC) 20 MG capsule Take 20 mg by mouth daily.        Marland Kitchen levothyroxine (SYNTHROID) 100 MCG tablet Take 1 tablet (100 mcg total) by mouth daily.  30 tablet  5  . lithium 300 MG capsule Take 300 mg by mouth 2 (two) times daily with a meal.        . losartan (COZAAR) 50 MG tablet Take 1 tablet (50 mg total) by mouth daily.  90 tablet  1  . metFORMIN (GLUCOPHAGE-XR) 500 MG 24 hr tablet take 1 tablet by mouth every morning  30 tablet  5  . Multiple Vitamin (MULTIVITAMIN) tablet Take 1 tablet by mouth daily.        Marland Kitchen omeprazole (PRILOSEC) 40 MG capsule Take 1 capsule (40 mg  total) by mouth daily.  90 capsule  1  . ONE TOUCH LANCETS MISC by Does not apply route.        Marland Kitchen glucose blood (BAYER CONTOUR TEST) test strip Use as instructed  100 each  12    Allergies  Allergen Reactions  . Codeine     REACTION: vomiting  . Metronidazole   . Penicillins     REACTION: throat swelling, rash    ROS:  13 systems were reviewed and  are remarkable for increased anxiety after a breakup with her boyfriend as well as some depression are unremarkable.  Exam: . Filed Vitals:   03/12/12 0950  BP: 116/72  Pulse: 88  Weight: 134 lb (60.782 kg)    In general, well appearing women.  Motor:  Fine high frequency postural tremor bilateraly.  No rest component, no significant action component.  Decreased amplitude of fine finger movements, but no significant slowing.    Gait:  Normal gait and station.    Impression/Recommendations: 1.  Essential tremor - Since stopping her lithium is not an option I offered treatment with propranolol.  Of course she would have to watch her mood, but in my experience there is no certainty that  it would cause depression.  I am going to talk to Dr. Graciela Husbands about the use of propranolol. 2.  History of stroke - CTA head and neck unremarkable.  W/U complete, presumed cardioembolic, patient now on pradaxa.  The patient will follow with Dr. Lurena Joiner Tat.  Lupita Raider Modesto Charon, MD [facility], [place]

## 2012-03-15 ENCOUNTER — Other Ambulatory Visit: Payer: Self-pay | Admitting: Internal Medicine

## 2012-03-18 ENCOUNTER — Telehealth: Payer: Self-pay | Admitting: Internal Medicine

## 2012-03-18 NOTE — Telephone Encounter (Signed)
New Problem:    Called in needing a refill of her PRADAXA 150 MG CAPS faxed into [phone].  Please call back once the order has been placed.

## 2012-03-25 ENCOUNTER — Other Ambulatory Visit: Payer: Self-pay | Admitting: Neurology

## 2012-03-25 ENCOUNTER — Telehealth: Payer: Self-pay | Admitting: Neurology

## 2012-03-25 MED ORDER — PROPRANOLOL HCL 20 MG PO TABS: ORAL_TABLET | ORAL | Status: DC

## 2012-03-25 NOTE — Telephone Encounter (Signed)
Spoke with Steward Drone. Information given as per Dr. Modesto Charon. The prescription to be sent to [facility] on [address]. No additional questions or concerns voiced at this time.

## 2012-03-25 NOTE — Telephone Encounter (Signed)
Message copied by Benay Spice on Mon Mar 25, 2012  3:21 PM ------      Message from: Milas Gain      Created: Mon Mar 25, 2012  9:24 AM       Hi Jan.            could you call pt and let them know I talked to Dr. Graciela Husbands and he is ok starting propranolol for her tremor.  I would start 20 daily for 2 weeks and then 20 bid - morning and dinner.            Thx.            Judie Petit

## 2012-04-02 ENCOUNTER — Other Ambulatory Visit: Payer: Self-pay | Admitting: *Deleted

## 2012-04-02 MED ORDER — LOSARTAN POTASSIUM 50 MG PO TABS: 50.0000 mg | ORAL_TABLET | Freq: Every day | ORAL | Status: DC

## 2012-04-04 ENCOUNTER — Encounter: Payer: Self-pay | Admitting: *Deleted

## 2012-04-05 ENCOUNTER — Other Ambulatory Visit: Payer: Self-pay | Admitting: *Deleted

## 2012-04-05 DIAGNOSIS — E039 Hypothyroidism, unspecified: Secondary | ICD-10-CM

## 2012-04-05 MED ORDER — LEVOTHYROXINE SODIUM 100 MCG PO TABS: 100.0000 ug | ORAL_TABLET | Freq: Every day | ORAL | Status: DC

## 2012-04-11 ENCOUNTER — Telehealth: Payer: Self-pay

## 2012-04-11 DIAGNOSIS — Z7251 High risk heterosexual behavior: Secondary | ICD-10-CM

## 2012-04-11 NOTE — Telephone Encounter (Signed)
HIV, GC and Chlam orders in EMR

## 2012-04-11 NOTE — Telephone Encounter (Signed)
Pt advised of lab orders.

## 2012-04-11 NOTE — Telephone Encounter (Signed)
Pt called stating that she has become "suspicious" of live in boyfriend and is requesting STD panel testing.

## 2012-04-18 ENCOUNTER — Other Ambulatory Visit: Payer: Medicare HMO

## 2012-04-18 DIAGNOSIS — Z7251 High risk heterosexual behavior: Secondary | ICD-10-CM

## 2012-04-18 LAB — T4, FREE: Free T4: 1.41 ng/dL (ref 0.80–1.80)

## 2012-04-18 LAB — T3, FREE: T3, Free: 2.8 pg/mL (ref 2.3–4.2)

## 2012-04-18 LAB — TSH: TSH: 2.106 u[IU]/mL (ref 0.350–4.500)

## 2012-04-18 LAB — HIV ANTIBODY (ROUTINE TESTING W REFLEX): HIV: NONREACTIVE

## 2012-04-19 NOTE — Progress Notes (Signed)
Patient notified per MD.

## 2012-04-26 ENCOUNTER — Encounter: Payer: Self-pay | Admitting: Gastroenterology

## 2012-05-03 ENCOUNTER — Emergency Department (HOSPITAL_COMMUNITY)
Admission: EM | Admit: 2012-05-03 | Discharge: 2012-05-03 | Disposition: A | Discharge status: Home / Self Care | Payer: Medicare HMO | Attending: Emergency Medicine | Admitting: Emergency Medicine

## 2012-05-03 ENCOUNTER — Emergency Department (HOSPITAL_COMMUNITY): Payer: Medicare HMO

## 2012-05-03 ENCOUNTER — Encounter (HOSPITAL_COMMUNITY): Payer: Self-pay | Admitting: Emergency Medicine

## 2012-05-03 DIAGNOSIS — T148XXA Other injury of unspecified body region, initial encounter: Secondary | ICD-10-CM

## 2012-05-03 DIAGNOSIS — J4489 Other specified chronic obstructive pulmonary disease: Secondary | ICD-10-CM | POA: Insufficient documentation

## 2012-05-03 DIAGNOSIS — W108XXA Fall (on) (from) other stairs and steps, initial encounter: Secondary | ICD-10-CM | POA: Insufficient documentation

## 2012-05-03 DIAGNOSIS — S1093XA Contusion of unspecified part of neck, initial encounter: Secondary | ICD-10-CM | POA: Insufficient documentation

## 2012-05-03 DIAGNOSIS — S0003XA Contusion of scalp, initial encounter: Secondary | ICD-10-CM | POA: Insufficient documentation

## 2012-05-03 DIAGNOSIS — F319 Bipolar disorder, unspecified: Secondary | ICD-10-CM | POA: Insufficient documentation

## 2012-05-03 DIAGNOSIS — R269 Unspecified abnormalities of gait and mobility: Secondary | ICD-10-CM | POA: Insufficient documentation

## 2012-05-03 DIAGNOSIS — Z79899 Other long term (current) drug therapy: Secondary | ICD-10-CM | POA: Insufficient documentation

## 2012-05-03 DIAGNOSIS — E119 Type 2 diabetes mellitus without complications: Secondary | ICD-10-CM | POA: Insufficient documentation

## 2012-05-03 DIAGNOSIS — I10 Essential (primary) hypertension: Secondary | ICD-10-CM | POA: Insufficient documentation

## 2012-05-03 DIAGNOSIS — J449 Chronic obstructive pulmonary disease, unspecified: Secondary | ICD-10-CM | POA: Insufficient documentation

## 2012-05-03 DIAGNOSIS — M25559 Pain in unspecified hip: Secondary | ICD-10-CM | POA: Insufficient documentation

## 2012-05-03 DIAGNOSIS — I2510 Atherosclerotic heart disease of native coronary artery without angina pectoris: Secondary | ICD-10-CM | POA: Insufficient documentation

## 2012-05-03 MED ORDER — ONDANSETRON 4 MG PO TBDP
8.0000 mg | ORAL_TABLET | Freq: Once | ORAL | Status: AC
  Administered 2012-05-03: 8 mg via ORAL

## 2012-05-03 MED ORDER — ONDANSETRON 4 MG PO TBDP
ORAL_TABLET | ORAL | Status: AC
  Filled 2012-05-03: qty 2

## 2012-05-03 NOTE — ED Notes (Signed)
Pt changed into gown from waist up.

## 2012-05-03 NOTE — ED Notes (Signed)
Per EMS: pt fell on escalator and hit back of head; hematoma noted; pt on blood thinners; denies LOC or other injury; pt mae; PERRL

## 2012-05-03 NOTE — ED Notes (Signed)
Patient transported to CT 

## 2012-05-06 ENCOUNTER — Telehealth: Payer: Self-pay | Admitting: Internal Medicine

## 2012-05-06 NOTE — Telephone Encounter (Signed)
She got a call from [facility] where she gets her meds from and they told there she needed a refill on her atovastatin and her fluoxetine.  They had told her they had sent in a couple requests   Pharmacy fax number [phone]

## 2012-05-07 ENCOUNTER — Ambulatory Visit (INDEPENDENT_AMBULATORY_CARE_PROVIDER_SITE_OTHER): Payer: Medicare HMO | Admitting: "Endocrinology

## 2012-05-07 ENCOUNTER — Encounter: Payer: Self-pay | Admitting: "Endocrinology

## 2012-05-07 VITALS — BP 118/80 | HR 67 | Wt 129.6 lb

## 2012-05-07 DIAGNOSIS — R251 Tremor, unspecified: Secondary | ICD-10-CM

## 2012-05-07 DIAGNOSIS — R231 Pallor: Secondary | ICD-10-CM

## 2012-05-07 DIAGNOSIS — Z720 Tobacco use: Secondary | ICD-10-CM

## 2012-05-07 DIAGNOSIS — I10 Essential (primary) hypertension: Secondary | ICD-10-CM

## 2012-05-07 DIAGNOSIS — R259 Unspecified abnormal involuntary movements: Secondary | ICD-10-CM

## 2012-05-07 DIAGNOSIS — E063 Autoimmune thyroiditis: Secondary | ICD-10-CM

## 2012-05-07 DIAGNOSIS — E038 Other specified hypothyroidism: Secondary | ICD-10-CM

## 2012-05-07 DIAGNOSIS — F17200 Nicotine dependence, unspecified, uncomplicated: Secondary | ICD-10-CM

## 2012-05-07 DIAGNOSIS — E559 Vitamin D deficiency, unspecified: Secondary | ICD-10-CM

## 2012-05-07 DIAGNOSIS — IMO0001 Reserved for inherently not codable concepts without codable children: Secondary | ICD-10-CM

## 2012-05-07 DIAGNOSIS — E049 Nontoxic goiter, unspecified: Secondary | ICD-10-CM

## 2012-05-07 DIAGNOSIS — E210 Primary hyperparathyroidism: Secondary | ICD-10-CM

## 2012-05-07 LAB — POCT GLYCOSYLATED HEMOGLOBIN (HGB A1C): Hemoglobin A1C: 5.9

## 2012-05-07 LAB — GLUCOSE, POCT (MANUAL RESULT ENTRY): POC Glucose: 123 mg/dl — AB (ref 70–99)

## 2012-05-07 MED ORDER — FLUOXETINE HCL 20 MG PO CAPS: 20.0000 mg | ORAL_CAPSULE | Freq: Every day | ORAL | Status: DC

## 2012-05-07 MED ORDER — ATORVASTATIN CALCIUM 80 MG PO TABS: 80.0000 mg | ORAL_TABLET | Freq: Every day | ORAL | Status: DC

## 2012-05-07 NOTE — Telephone Encounter (Signed)
Rx's sent, pt's daughter informed.

## 2012-05-07 NOTE — Progress Notes (Signed)
Subjective:  Patient Name: Stacy [name] Date of Birth: March 18, 1941  MRN: [id number]  Stacy [name]  presents to the office today for follow-up of her pre-diabetes/T2DM, hypothyroidism, thyroiditis, hypertension, fatigue, secondary hyperparathyroidism, vitamin D deficiency, tobacco abuse, and tremor.  HISTORY OF PRESENT ILLNESS:   [name] is a 71 y.o. Caucasian woman.  Jaysie was unaccompanied.  1. Patient was first referred to me on 09/04/05 for evaluation and management of chronic hypothyroidism and new hypercalcemia and hyperparathyroidism. In brief, she developed vitamin D deficiency, then probably secondary hyperparathyroidism, and then tertiary hyperparathyroidism. Her right inferior parathyroid gland was quite enlarged. She was taken to surgery on 12/07/05 for parathyroidectomy. Since that time she's done well, provided that she has continue to take her calcium and vitamin D.  She has had some waxing and waning of her thyroid gland size during these years, which is consistent with inflammatory flare-ups of her Hashimoto's disease. We've increased her thyroid hormone dose slightly over time. She now takes 100 mcg of Synthroid daily. She has remained euthyroid most of the time.  2. The patient has experienced many other problems during the last 6 years, to include bipolar disorder, GERD, fatigue, iron deficiency, 2 heart attacks, hypertension, orthostatic hypotension, atrial fibrillation, and the need for a cardiac pacemaker. We've helped to manage some of those problems and kept her as supplied as we could with samples of antihypertensive medications and Synthroid. She was diagnosed with type 2 diabetes in March. She was started on metformin 500 mg once daily. She was also started on atorvastatin for hyperlipidemia. 3. The patient's last [facility] visit was on 10/25/11. In the interim, she saw a neurologist who felt that her memory was normal for her age. He did not think that she had Parkinson's Disease.  He put her on propranolol for her tremor and her tremor has improved. Her memory is essentially unchanged. She still has some "senior moments" but is other wise doing well. When she pays attention she remembers better. The site of her squamous cell cancer resection on her abdomen has healed. She hurt her right hand about two weeks ago, accidentally striking her hand on the rotor of her washing machine. On 05/03/22 she was on an escalator going up, caught her shoe, and fell backward, striking her occiput and left forearm. She has bruising of both the right hand and left forearm. Except for trauma, however, she does not usually have bruising. She is taking levothyroxine, 100 mcg/day, instead of the Synthroid that I had ordered,  and metformin, 500 mg once daily. Food still tastes good. Since breaking up with her companion she has stopped eating out every night and has been having smaller meals at home. If she is home alone she will not usually prepare a big meal. She does sometimes drink regular coke and sometimes eats more candy than she should. She does like to have pasta.  3. Pertinent Review of Systems:  Constitutional: The patient says, "I actually feel pretty good physically". She broke up with her female companion, so she is "heart sick." Her energy level is "okay".  Eyes: Vision is good as long as she wears her glasses. She had another eye exam two weeks ago and had a good report. There are no other significant eye complaints. Neck: The patient has no complaints of anterior neck swelling, soreness, tenderness,  pressure, discomfort, or difficulty swallowing.  Heart: Her pacemaker is still working. She has no complaints of palpitations, irregular heat beats, chest pain, or chest pressure. Gastrointestinal:  She has constipation alternating with diarrhea depending upon her fluid and fiber intake. The patient has no complaints of excessive hunger, acid reflux, upset stomach, stomach aches or pains. Arms: She  is no longer having pain in her right shoulder, but does have some right scapular pain as a result of her fall last week.  Legs: Muscle mass and strength seem normal. Her knees and hips are doing fine. There are no complaints of numbness, tingling, or burning. No edema is noted. Feet: There are no obvious foot problems. There are no complaints of numbness, tingling, burning, or pain. No edema is noted. Hypoglycemia: None   PAST MEDICAL, FAMILY, AND SOCIAL HISTORY:  Past Medical History  Diagnosis Date  . Chronic bipolar disorder   . Coronary artery disease     s/p PTCA  . Complete AV block     s/p PPM--MEDTRONIC ADAPTA ADDr01  . Takotsubo syndrome   . GERD (gastroesophageal reflux disease)   . COPD (chronic obstructive pulmonary disease)     TOBACCO ABUSE  . Abscess of liver   . Atrial fibrillation     chronic anticoag - pradaxa  . BIPOLAR AFFECTIVE DISORDER   . DEPRESSION   . DIABETES MELLITUS, TYPE II dx 04/2010  . DYSLIPIDEMIA   . HYPERTENSION   . HYPOTHYROIDISM   . TOBACCO ABUSE   . Pacemaker-Medtronic-dual-chamber   . Parathyroid related hypercalcemia   . Primary hyperparathyroidism   . Vitamin D deficiency disease     Family History  Problem Relation Age of Onset  . Heart disease Mother   . Colon cancer      FH  . Diabetes Maternal Aunt     Current outpatient prescriptions:atorvastatin (LIPITOR) 80 MG tablet, Take 80 mg by mouth daily., Disp: , Rfl: ;  Calcium Carbonate (CALCIUM 500 PO), Take 1 tablet by mouth daily. , Disp: , Rfl: ;  dabigatran (PRADAXA) 150 MG CAPS, Take 150 mg by mouth every 12 (twelve) hours., Disp: , Rfl: ;  FLUoxetine (PROZAC) 20 MG capsule, Take 20 mg by mouth daily.  , Disp: , Rfl:  levothyroxine (SYNTHROID, LEVOTHROID) 100 MCG tablet, Take 100 mcg by mouth daily., Disp: , Rfl: ;  lithium 300 MG capsule, Take 300 mg by mouth 2 (two) times daily with a meal.  , Disp: , Rfl: ;  losartan (COZAAR) 50 MG tablet, Take 50 mg by mouth daily., Disp: ,  Rfl: ;  metFORMIN (GLUCOPHAGE-XR) 500 MG 24 hr tablet, Take 500 mg by mouth daily with breakfast. , Disp: , Rfl:  Multiple Vitamin (MULTIVITAMIN) tablet, Take 1 tablet by mouth daily.  , Disp: , Rfl: ;  omeprazole (PRILOSEC) 40 MG capsule, Take 40 mg by mouth daily., Disp: , Rfl: ;  propranolol (INDERAL) 20 MG tablet, Take 20 mg by mouth 2 (two) times daily., Disp: , Rfl: ;  glucose blood (BAYER CONTOUR TEST) test strip, Use as instructed, Disp: 100 each, Rfl: 12  Allergies as of 05/07/2012 - Review Complete 05/07/2012  Allergen Reaction Noted  . Codeine    . Latex Other (See Comments) 05/03/2012  . Metronidazole Other (See Comments) 03/26/2008  . Penicillins      1. Work and Family: She is no longer working outside the home. Her daughter, Joni Reining, lives with her.  2. Activities: She has been walking more, 45-60 minutes per day.   3. Smoking, alcohol, or drugs: She still smokes. She says that she will quit when she drops dead.  4. Primary Care Provider: Rene Paci,  MD  ROS: There are no other significant problems involving Nolia's other body systems.   Objective:  Vital Signs:  BP 118/80  Pulse 67  Wt 129 lb 9.6 oz (58.786 kg)   Ht Readings from Last 3 Encounters:  11/14/11 5\' 6"  (1.676 m)  11/08/11 5\' 6"  (1.676 m)  11/17/10 5\' 6"  (1.676 m)   Wt Readings from Last 3 Encounters:  05/07/12 129 lb 9.6 oz (58.786 kg)  03/12/12 134 lb (60.782 kg)  12/11/11 137 lb (62.143 kg)   PHYSICAL EXAM:  Constitutional: The patient appears healthy and well nourished.  She has lost another 12 pounds gradually since she began exercising after her last visit in March and since she is no longer eating out every night with her former companion. .  Head: Her head tremor is much reduced since starting propranolol.  Face: The face appears normal.  Eyes: There is no obvious arcus or proptosis. Moisture appears normal. Mouth: The oropharynx and tongue appear normal. Oral moisture is  normal. Neck: The neck appears to be visibly normal. No carotid bruits are noted. She has a low-lying thyroid gland. The thyroid gland is probably within normal size at 18-20 grams. Both lobes today are within normal limits for size. The consistency of the thyroid gland is normal. The thyroid gland is not tender to palpation. Lungs: The lungs are clear to auscultation. Air movement is good. Heart: Heart rate and rhythm are regular. Heart sounds S1 and S2 are normal. I did not appreciate any pathologic cardiac murmurs. Abdomen: The abdomen is slightly enlarged, but less so. Bowel sounds are normal. There is no obvious hepatomegaly, splenomegaly, or other mass effect.  Arms: Muscle size and bulk are normal for age. She has no cog-wheeling of her arms when I passively flex and extend her arms at the elbows. Hands: She has a 1-2+ hand tremor. Phalangeal and metacarpophalangeal joints are normal. Palmar muscles are normal. Palmar skin is normal. Palmar moisture is also normal. Fingernails are somewhat pale. Legs: Muscles appear normal for age. No edema is present. Neurologic: Strength is normal for age in both the upper and lower extremities. Muscle tone is normal. Sensation to touch is normal in both legs. She walked normally, without any of the hesitation and rigidity usually seen with Parkinson's disease.   LAB DATA: Hemoglobin A1c today was 5.9%, compared with 5.4% at last visit and 5.7% at the prior visit.   04/18/12: TSH was 2.106, Free T4 was 1.41. Free T3 was 2.8.                    10/25/11: PTH 30.8, calcium 10.4, 25-hydroxy vitamin D 43; WBC 7.8, hemoglobin 14.9, hematocrit 46.9%, iron 71             Assessment and Plan:   ASSESSMENT:  1. Hypothyroid: The patient is euthyroid now on generic LT4, 100 mcg daily. 2. Hashimoto's thyroiditis: Clinically quiescent  3. Hypertension: Her blood pressure is better, c/w weight loss and exercise.   4. T2DM/Prediabetes: Her HbA1c is higher today, c/w  more starch and sugar intake than she needs.   5.  Fatigue: She is really feeling very well. 6.  Hyperparathyroidism and vitamin D deficiency: She was doing well in January 2012 and continued to do well in March of this year.  7. Tobacco abuse: She needs to stop smoking, but is not motivated to so at this time. 8. Tremor: Her tremor was not felt to be due to Parkinson's Dz.  She is doing better with propranolol.  9. Pallor: Her iron level, hemoglobin, and hematocrit were fine. Pallor has improved over time.   10. Goiter: Her thyroid gland is smaller today, c/w less thyroiditis.   PLAN:  1. Diagnostic:  PTH, calcium, 25-hydroxy vitamin D, and TFTs in 6 months. 2. Therapeutic:  Eat Right, exercise right (walk 30-60 minutes per day), and take metformin. Stop smoking. 3. Patient education: We talked a lot about her tremor, her smoking, and her thyroiditis and hypothyroidism. I really leaned on her to quit smoking "cold [place]".   4. Follow-up: 6 months  Level of Service: This visit lasted in excess of 40 minutes. More than 50% of the visit was devoted to counseling.   David Stall, MD 05/07/2012 10:08 AM

## 2012-05-07 NOTE — Patient Instructions (Signed)
Follow up visit in 6 months. Please STOP SMOKING.

## 2012-05-09 NOTE — ED Provider Notes (Signed)
History     CSN: [id number]  Arrival date & time 05/03/12  1106   First MD Initiated Contact with Patient 05/03/12 1242      Chief Complaint  Patient presents with  . Fall    (Consider location/radiation/quality/duration/timing/severity/associated sxs/prior treatment) HPI Comments: Stacy [name] 71 y.o. female   The chief complaint is: Patient presents with:   Fall    71 year old female presents today with chief complaint of fall and head injury. Patient states that she was on the escalator when her flip-flop got caught. She was turned back, and slammed her head against the ground. Patient is on blood thinners. She states that she did not lose consciousness at the time. She denies headache, visual changes, weakness. Patient complains of large, tender bump on the left occiput. She also complains of pain in the left hip and sacral area, where she fell on her bottom. She denies any chest pain or shortness of breath. She denies any nausea or vomiting. She denies paresthesias. At any racing or skipping heart.    Patient is a 71 y.o. female presenting with fall. The history is provided by the patient and medical records. No language interpreter was used.  Fall The accident occurred less than 1 hour ago. Incident: while on escalator. She fell from a height of 3 to 5 ft. She landed on a hard floor. There was no blood loss. The point of impact was the head and left hip (left buttock). The pain is present in the head and left hip. The pain is at a severity of 5/10. The pain is moderate. She was ambulatory at the scene. There was no entrapment after the fall. There was no drug use involved in the accident. There was no alcohol use involved in the accident. Pertinent negatives include no visual change, no fever, no numbness, no abdominal pain, no bowel incontinence, no nausea, no vomiting, no hematuria, no headaches, no hearing loss, no loss of consciousness and no tingling. Treatment on scene  includes a c-collar and a backboard. She has tried nothing for the symptoms.    Past Medical History  Diagnosis Date  . Chronic bipolar disorder   . Coronary artery disease     s/p PTCA  . Complete AV block     s/p PPM--MEDTRONIC ADAPTA ADDr01  . Takotsubo syndrome   . GERD (gastroesophageal reflux disease)   . COPD (chronic obstructive pulmonary disease)     TOBACCO ABUSE  . Abscess of liver   . Atrial fibrillation     chronic anticoag - pradaxa  . BIPOLAR AFFECTIVE DISORDER   . DEPRESSION   . DIABETES MELLITUS, TYPE II dx 04/2010  . DYSLIPIDEMIA   . HYPERTENSION   . HYPOTHYROIDISM   . TOBACCO ABUSE   . Pacemaker-Medtronic-dual-chamber   . Parathyroid related hypercalcemia   . Primary hyperparathyroidism   . Vitamin D deficiency disease     Past Surgical History  Procedure Date  . Insert / replace / remove pacemaker     MEDTRONIC ADAPTA ADDr01  . Parathyroidectomy     RIGHT INFERIOR  . Abdominal hysterectomy     Family History  Problem Relation Age of Onset  . Heart disease Mother   . Colon cancer      FH  . Diabetes Maternal Aunt     History  Substance Use Topics  . Smoking status: Current Every Day Smoker -- 1.0 packs/day for 48 years    Types: Cigarettes  . Smokeless tobacco: Never Used  Comment: Widowed- 2 girls. Lives with daughter (who is nurse at [employer])  . Alcohol Use: No    OB History    Grav Para Term Preterm Abortions TAB SAB Ect Mult Living                  Review of Systems  Constitutional: Negative for fever.  HENT: Negative for hearing loss, ear pain, trouble swallowing and neck pain.   Respiratory: Negative for shortness of breath.   Cardiovascular: Negative for chest pain.  Gastrointestinal: Negative for nausea, vomiting, abdominal pain and bowel incontinence.  Genitourinary: Negative for hematuria.  Musculoskeletal: Positive for back pain and gait problem (history of poor balance).  Skin: Negative for wound.  Neurological:  Negative for dizziness, tingling, seizures, loss of consciousness, syncope, facial asymmetry, speech difficulty, weakness, light-headedness, numbness and headaches.    Allergies  Codeine; Latex; Metronidazole; and Penicillins  Home Medications   Current Outpatient Rx  Name Route Sig Dispense Refill  . CALCIUM 500 PO Oral Take 1 tablet by mouth daily.     Marland Kitchen DABIGATRAN ETEXILATE MESYLATE 150 MG PO CAPS Oral Take 150 mg by mouth every 12 (twelve) hours.    Marland Kitchen LEVOTHYROXINE SODIUM 100 MCG PO TABS Oral Take 100 mcg by mouth daily.    Marland Kitchen LITHIUM CARBONATE 300 MG PO CAPS Oral Take 300 mg by mouth 2 (two) times daily with a meal.      . LOSARTAN POTASSIUM 50 MG PO TABS Oral Take 50 mg by mouth daily.    Marland Kitchen METFORMIN HCL ER 500 MG PO TB24 Oral Take 500 mg by mouth daily with breakfast.     . ONE-DAILY MULTI VITAMINS PO TABS Oral Take 1 tablet by mouth daily.      Marland Kitchen OMEPRAZOLE 40 MG PO CPDR Oral Take 40 mg by mouth daily.    Marland Kitchen PROPRANOLOL HCL 20 MG PO TABS Oral Take 20 mg by mouth 2 (two) times daily.    . ATORVASTATIN CALCIUM 80 MG PO TABS Oral Take 1 tablet (80 mg total) by mouth daily. 90 tablet 1  . FLUOXETINE HCL 20 MG PO CAPS Oral Take 1 capsule (20 mg total) by mouth daily. 90 capsule 1  . GLUCOSE BLOOD VI STRP  Use as instructed 100 each 12    BP 137/69  Pulse 61  Temp 97.8 F (36.6 C) (Oral)  Resp 20  SpO2 96%  Physical Exam  Constitutional: She is oriented to person, place, and time. She appears well-developed and well-nourished. No distress.  HENT:  Head: Normocephalic and atraumatic.    Right Ear: Tympanic membrane and external ear normal.  Left Ear: Tympanic membrane and external ear normal.  Nose: Nose normal.  Mouth/Throat: Uvula is midline. Normal dentition.  Eyes: Conjunctivae normal and EOM are normal. Pupils are equal, round, and reactive to light. No scleral icterus.  Neck: Normal range of motion and full passive range of motion without pain. Neck supple. No spinous  process tenderness and no muscular tenderness present. No edema, no erythema and normal range of motion present.       No spinous process tenderness. Full range of motion.  Cardiovascular: Normal rate, regular rhythm and normal heart sounds.  Exam reveals no gallop and no friction rub.   No murmur heard. Pulmonary/Chest: Effort normal and breath sounds normal. No respiratory distress.  Abdominal: Soft. Bowel sounds are normal. She exhibits no distension and no mass. There is no tenderness. There is no guarding.  Musculoskeletal:  Back:       Tender to palpation of the left buttock. Pain is muscular in origin. There is no bony tenderness. Full passive range of motion of the hip. 5 out of 5 strength bilaterally.  Neurological: She is alert and oriented to person, place, and time. She has normal strength and normal reflexes. She displays normal reflexes. No cranial nerve deficit or sensory deficit. She displays a negative Romberg sign. Coordination normal. GCS eye subscore is 4. GCS verbal subscore is 5. GCS motor subscore is 6.  Skin: Skin is warm and dry. She is not diaphoretic.  Psychiatric: She has a normal mood and affect. Her behavior is normal. Judgment and thought content normal.    ED Course  Procedures (including critical care time)  Labs Reviewed - No data to display No results found.   1. Hematoma     CT Head Wo Contrast (Final result)   Result time:05/03/12 1345    Final result by Rad Results In Interface (05/03/12 13:45:30)    Narrative:   *RADIOLOGY REPORT*  Clinical Data: Fall, head trauma.  CT HEAD WITHOUT CONTRAST  Technique: Contiguous axial images were obtained from the base of the skull through the vertex without contrast.  Comparison: 12/14/2011  Findings: Mild prominence of the sulci, cisterns, and ventricles, in keeping with volume loss. There are mild subcortical and periventricular white matter hypodensities, a nonspecific finding most often seen  with chronic microangiopathic changes. There may be a remote left basal ganglia/anterior limb internal capsule lacunar infarction. There is no evidence for acute hemorrhage, overt hydrocephalus, mass lesion, or abnormal extra-axial fluid collection. No definite CT evidence for acute cortical based (large artery) infarction. Small left posterior scalp hematoma. Underlying calvarial fracture. Mild mucosal thickening along the right sphenoid chamber. Otherwise the visualized paranasal sinuses and mastoid air cells are predominately clear.  IMPRESSION: Small left posterior scalp hematoma. No underlying calvarial fracture.  Mild white matter changes and remote left basal ganglia/anterior limb internal capsule lacunar infarction as above. No CT evidence of acute intracranial abnormality.   Original Report Authenticated By: Waneta Martins, M.D.      MDM  Cervical spine cleared by next is criteria. CT scan is negative for any intra-cranial abnormalities. There is an old lacunar infarct. Patient's exam is neurologically intact. She denies headache or severe pain at this time. Physical exam does show hematoma on the occiput. Patient may take Tylenol for pain and supportive care with ice.  The patient complains of left hip and lower back pain, however, there is no bony tenderness. She clinically does not present as fracture. There may be some developing bruising. I suspect that pain is related to soft tissue damage and do not feel that the patient needs imaging at this time. I explained this to the patient, who agrees that she likely does not have a fracture, and is sore from the fall. All questions answered fully. Discussed reasons to seek immediate care. Patient expresses understanding and agrees with plan.         Arthor Captain, PA-C 05/11/12 1751

## 2012-05-14 ENCOUNTER — Other Ambulatory Visit: Payer: Self-pay | Admitting: Internal Medicine

## 2012-05-14 NOTE — ED Provider Notes (Signed)
Medical screening examination/treatment/procedure(s) were conducted as a shared visit with non-physician practitioner(s) and myself.  I personally evaluated the patient during the encounter   Pt with mechanical fall, head injury.  CT shows no intracranial sig injury.  Scalp hematoma.  No overt mentation changes.  D/c home with strict return precautions.    Gavin Pound. Josslin Sanjuan, MD 05/14/12 2316

## 2012-06-03 ENCOUNTER — Encounter: Payer: Medicare HMO | Admitting: *Deleted

## 2012-06-04 ENCOUNTER — Encounter: Payer: Self-pay | Admitting: Internal Medicine

## 2012-06-07 ENCOUNTER — Other Ambulatory Visit: Payer: Self-pay | Admitting: *Deleted

## 2012-06-07 DIAGNOSIS — E038 Other specified hypothyroidism: Secondary | ICD-10-CM

## 2012-06-07 MED ORDER — LEVOTHYROXINE SODIUM 100 MCG PO TABS: 100.0000 ug | ORAL_TABLET | Freq: Every day | ORAL | Status: DC

## 2012-06-11 ENCOUNTER — Encounter: Payer: Self-pay | Admitting: *Deleted

## 2012-06-13 ENCOUNTER — Other Ambulatory Visit: Payer: Self-pay | Admitting: *Deleted

## 2012-06-13 DIAGNOSIS — E038 Other specified hypothyroidism: Secondary | ICD-10-CM

## 2012-06-13 MED ORDER — LEVOTHYROXINE SODIUM 100 MCG PO TABS: 100.0000 ug | ORAL_TABLET | Freq: Every day | ORAL | Status: DC

## 2012-06-18 ENCOUNTER — Encounter: Payer: Self-pay | Admitting: Internal Medicine

## 2012-06-18 ENCOUNTER — Ambulatory Visit (INDEPENDENT_AMBULATORY_CARE_PROVIDER_SITE_OTHER): Payer: Medicare HMO | Admitting: *Deleted

## 2012-06-18 DIAGNOSIS — I442 Atrioventricular block, complete: Secondary | ICD-10-CM

## 2012-06-18 DIAGNOSIS — Z950 Presence of cardiac pacemaker: Secondary | ICD-10-CM

## 2012-06-21 LAB — REMOTE PACEMAKER DEVICE
AL AMPLITUDE: 2.8 mv
RV LEAD IMPEDENCE PM: 1107 Ohm
RV LEAD THRESHOLD: 1.125 V
VENTRICULAR PACING PM: 13

## 2012-07-02 ENCOUNTER — Ambulatory Visit (INDEPENDENT_AMBULATORY_CARE_PROVIDER_SITE_OTHER): Payer: Medicare HMO | Admitting: Internal Medicine

## 2012-07-02 ENCOUNTER — Encounter: Payer: Self-pay | Admitting: Internal Medicine

## 2012-07-02 VITALS — BP 122/78 | HR 67 | Temp 97.1°F | Resp 18 | Ht 66.0 in | Wt 129.5 lb

## 2012-07-02 DIAGNOSIS — E785 Hyperlipidemia, unspecified: Secondary | ICD-10-CM

## 2012-07-02 DIAGNOSIS — I10 Essential (primary) hypertension: Secondary | ICD-10-CM

## 2012-07-02 DIAGNOSIS — Z13820 Encounter for screening for osteoporosis: Secondary | ICD-10-CM

## 2012-07-02 DIAGNOSIS — E119 Type 2 diabetes mellitus without complications: Secondary | ICD-10-CM

## 2012-07-02 DIAGNOSIS — I4891 Unspecified atrial fibrillation: Secondary | ICD-10-CM

## 2012-07-02 NOTE — Assessment & Plan Note (Signed)
On atorva for same - check lipids annually, adjust as needed  

## 2012-07-02 NOTE — Assessment & Plan Note (Signed)
BP Readings from Last 3 Encounters:  07/02/12 122/78  05/07/12 118/80  05/03/12 137/69   The current medical regimen is effective;  continue present plan and medications.

## 2012-07-02 NOTE — Assessment & Plan Note (Signed)
Dx 04/2010 on incidental labs - on metformin since then, follows with endo for same overall controlled by hx and labs On statin, ARB - refer for eye exam annually and check microalb urine Check a1c q3-34mo and adjust tx as needed Lab Results  Component Value Date   HGBA1C 5.9 05/07/2012

## 2012-07-02 NOTE — Patient Instructions (Signed)
It was good to see you today. Health Maintenance reviewed - all recommended immunizations and age-appropriate screenings are up-to-date. we'll make referral for bone density screening . Our office will contact you regarding appointment(s) once made. Test(s) ordered today. Your results will be released to MyChart (or called to you) after review, usually within 72hours after test completion. If any changes need to be made, you will be notified at that same time. Medications reviewed, no changes at this time. Please schedule followup in 6-12 months, call sooner if problems.

## 2012-07-02 NOTE — Progress Notes (Signed)
Subjective:    Patient ID: Stacy [name], female    DOB: August 19, 1940, 72 y.o.   MRN: [id number]  HPI   here for followup - reviewed chronic medical issues today   Hypothyroidism, hx Hashimoto's - follows with endo every 6 mo for same - no dose changes in thyroid replacment - no skin or weight changes   bipolar dz with depression - Li levels monitored periodically - no dose adjustments or changes -symptoms well controlled - follows with [facility] Nature conservation officer) several times each year and as needed - associated with long standing shaking of hands and arms, most notable in AM but not every AM - s/p neuro eval for same 2011 and spring 2013 and each time tremor felt related to Lithium -    dyslipidemia - on statin -reports compliance with ongoing medical treatment and no changes in medication dose or frequency. denies adverse side effects related to current therapy. no myalgia   Atrial fibrillation - on pradaxa for anticoag = follows with cards for same   CAD issues - reviewed hx - reports compliance with ongoing medical treatment and no changes in medication dose or frequency. denies adverse side effects related to current therapy.    diabetes mellitus 2 -dx by incidental labs 06/2010 at [facility] -on metformin for same since then and follows with endo- the patient reports compliance with medication(s) as prescribed. Denies adverse side effects.  Past Medical History  Diagnosis Date  . Chronic bipolar disorder   . Coronary artery disease     s/p PTCA  . Complete AV block     s/p PPM--MEDTRONIC ADAPTA ADDr01  . Takotsubo syndrome   . GERD (gastroesophageal reflux disease)   . COPD (chronic obstructive pulmonary disease)     TOBACCO ABUSE  . Abscess of liver   . Atrial fibrillation     chronic anticoag - pradaxa  . BIPOLAR AFFECTIVE DISORDER   . DEPRESSION   . DIABETES MELLITUS, TYPE II dx 04/2010  . DYSLIPIDEMIA   . HYPERTENSION   . HYPOTHYROIDISM     hashimoto's  . TOBACCO ABUSE   .  Pacemaker-Medtronic-dual-chamber   . Parathyroid related hypercalcemia   . Primary hyperparathyroidism   . Vitamin D deficiency disease      Review of Systems  Constitutional: Negative for fever and unexpected weight change.  Neurological: Positive for tremors. Negative for syncope and weakness.       Objective:   Physical Exam  BP 122/78  Pulse 67  Temp 97.1 F (36.2 C) (Oral)  Resp 18  Ht 5\' 6"  (1.676 m)  Wt 129 lb 8 oz (58.741 kg)  BMI 20.90 kg/m2  SpO2 95% Wt Readings from Last 3 Encounters:  07/02/12 129 lb 8 oz (58.741 kg)  05/07/12 129 lb 9.6 oz (58.786 kg)  03/12/12 134 lb (60.782 kg)   Constitutional: She appears well-developed and well-nourished. No distress.  Neck: Normal range of motion. Neck supple. No JVD present. No thyromegaly present.  Cardiovascular: Normal rate, regular rhythm and normal heart sounds.  No murmur heard. No BLE edema. Pulmonary/Chest: Effort normal and breath sounds normal. No respiratory distress. She has no wheezes.  Neurological: She is alert and oriented to person, place, and time. No cranial nerve deficit. Coordination normal. tremor BUE, intention>rest Psychiatric: She has a normal mood and affect. Her behavior is normal. Judgment and thought content normal.   Lab Results  Component Value Date   WBC 7.8 10/25/2011   HGB 14.9 10/25/2011   HCT 46.9*  10/25/2011   PLT 220 10/25/2011   GLUCOSE 105* 12/11/2011   CHOL 128 09/07/2010   TRIG 152.0* 09/07/2010   HDL 50.20 09/07/2010   LDLDIRECT 77.5 08/11/2009   LDLCALC 47 09/07/2010   ALT 20 09/07/2010   AST 19 09/07/2010   NA 140 12/11/2011   K 4.4 12/11/2011   CL 103 12/11/2011   CREATININE 0.9 12/11/2011   BUN 15 12/11/2011   CO2 28 12/11/2011   TSH 2.106 04/18/2012   INR 2.0 10/21/2009   HGBA1C 5.9 05/07/2012       Assessment & Plan:  See problem list. Medications and labs reviewed today.

## 2012-07-02 NOTE — Assessment & Plan Note (Signed)
Rate controlled, asymptomatic  anticoag with pradaxa Follows with cards annually and prn

## 2012-07-05 ENCOUNTER — Other Ambulatory Visit: Payer: Self-pay | Admitting: *Deleted

## 2012-07-05 DIAGNOSIS — E038 Other specified hypothyroidism: Secondary | ICD-10-CM

## 2012-07-05 MED ORDER — LEVOTHYROXINE SODIUM 100 MCG PO TABS: 100.0000 ug | ORAL_TABLET | Freq: Every day | ORAL | Status: DC

## 2012-07-05 NOTE — Progress Notes (Signed)
This encounter opened by mistake.  Due to a problem with the way EPIC lists Synthroid in an e-scribe RX, I need to fax a handwritten RX for a 90 day supply of Synthroid, 100 mcg, 1 tablet (100 mcg total) by mouth daily. SYNTHROID ONLY, BRAND NAME MEDICALLY NECESSARY to [facility].  They will not accept our e-scribed RX the way the medication name is listed.

## 2012-07-05 NOTE — Telephone Encounter (Signed)
05/13/12 RX Refill for pt's Synthroid 100 mcg was e-scribed to [facility], pt's mail order pharmacy.  The RX was written as "levothyroxine (SYNTHROID, LEVOTHROID) 100 MCG tablet."  Pt received a letter from [facility] stating that they cannot process the RX for Synthroid.  This RX is not available as written.  They are waiting for our office to fax a form they faxed [place] back to them.  The problem may very well be the way the RX is written:  "levothyroxine (SYNTHROID, LEVOTHROID) 100 MCG tablet."   I do not recall received any information from [facility].  I have called in an RX to [facility] on [address] at [phone] for Synthroid 100 mcg, 1 tab daily, #30, 5 refills.  I will try to fax a written RX for a 90 day supply to [facility] at [phone].

## 2012-07-09 ENCOUNTER — Other Ambulatory Visit: Payer: Self-pay | Admitting: *Deleted

## 2012-07-09 MED ORDER — OMEPRAZOLE 40 MG PO CPDR: 40.0000 mg | DELAYED_RELEASE_CAPSULE | Freq: Every day | ORAL | Status: DC

## 2012-07-09 MED ORDER — ATORVASTATIN CALCIUM 80 MG PO TABS: 80.0000 mg | ORAL_TABLET | Freq: Every day | ORAL | Status: DC

## 2012-07-10 ENCOUNTER — Other Ambulatory Visit: Payer: Self-pay

## 2012-07-10 MED ORDER — PROPRANOLOL HCL 20 MG PO TABS: 20.0000 mg | ORAL_TABLET | Freq: Two times a day (BID) | ORAL | Status: DC

## 2012-07-15 ENCOUNTER — Encounter: Payer: Self-pay | Admitting: *Deleted

## 2012-07-19 ENCOUNTER — Encounter: Payer: Self-pay | Admitting: Internal Medicine

## 2012-07-19 ENCOUNTER — Ambulatory Visit (INDEPENDENT_AMBULATORY_CARE_PROVIDER_SITE_OTHER): Payer: Medicare HMO | Admitting: Internal Medicine

## 2012-07-19 VITALS — BP 126/76 | HR 64 | Temp 97.1°F | Ht 66.0 in | Wt 126.0 lb

## 2012-07-19 DIAGNOSIS — J029 Acute pharyngitis, unspecified: Secondary | ICD-10-CM

## 2012-07-19 MED ORDER — CEFUROXIME AXETIL 250 MG PO TABS: 250.0000 mg | ORAL_TABLET | Freq: Two times a day (BID) | ORAL | Status: DC

## 2012-07-19 NOTE — Patient Instructions (Signed)

## 2012-07-19 NOTE — Progress Notes (Signed)
Subjective:    Patient ID: Stacy [name], female    DOB: 08-Nov-1940, 71 y.o.   MRN: [id number]  HPI  Pt presents to the clinic with c/o sore throat. This started yesterday. She has also noted white patches on the left side of her throat. She tried salt water gargles without relief. She also c/o fever and fatigue. She has never had strep throat before that she knows of. She is having pain with swallowing. She denies excessive drooling. She is unsure if she has had sick contacts.  Review of Systems     Past Medical History  Diagnosis Date  . Chronic bipolar disorder   . Coronary artery disease     s/p PTCA  . Complete AV block     s/p PPM--[employer]TRON[employer] ADAPTA ADDr01  . Takotsubo syndrome   . GERD (gastroesophageal reflux disease)   . COPD (chronic obstructive pulmonary disease)     TOBACCO ABUSE  . Abscess of liver   . Atrial fibrillation     chronic anticoag - pradaxa  . BIPOLAR AFFECTIVE DISORDER   . DEPRESSION   . DIABETES MELLITUS, TYPE II dx 04/2010  . DYSLIPIDEMIA   . HYPERTENSION   . HYPOTHYROIDISM     hashimoto's  . TOBACCO ABUSE   . Pacemaker-Medtronic-dual-chamber   . Parathyroid related hypercalcemia   . Primary hyperparathyroidism   . Vitamin D deficiency disease     Current Outpatient Prescriptions  Medication Sig Dispense Refill  . atorvastatin (LIPITOR) 80 MG tablet Take 1 tablet (80 mg total) by mouth daily.  90 tablet  1  . Calcium Carbonate (CALCIUM 500 PO) Take 1 tablet by mouth daily.       . dabigatran (PRADAXA) 150 MG CAPS Take 150 mg by mouth every 12 (twelve) hours.      Marland Kitchen FLUoxetine (PROZAC) 20 MG capsule Take 1 capsule (20 mg total) by mouth daily.  90 capsule  1  . glucose blood (BAYER CONTOUR TEST) test strip Use as instructed  100 each  12  . levothyroxine (SYNTHROID, LEVOTHROID) 100 MCG tablet Take 1 tablet (100 mcg total) by mouth daily. SYNTHROID ONLY, BRAND NAME MEDICALLY NECESSARY.  30 tablet  5  . lithium 300 MG capsule Take 300 mg by  mouth 2 (two) times daily with a meal.        . losartan (COZAAR) 50 MG tablet Take 50 mg by mouth daily.      . metFORMIN (GLUCOPHAGE-XR) 500 MG 24 hr tablet take 1 tablet by mouth every morning  30 tablet  5  . Multiple Vitamin (MULTIVITAMIN) tablet Take 1 tablet by mouth daily.        Marland Kitchen omeprazole (PRILOSEC) 40 MG capsule Take 1 capsule (40 mg total) by mouth daily.  90 capsule  1  . propranolol (INDERAL) 20 MG tablet Take 1 tablet (20 mg total) by mouth 2 (two) times daily.  60 tablet  1    Allergies  Allergen Reactions  . Codeine     REACTION: vomiting  . Latex Other (See Comments)    irritation  . Metronidazole Other (See Comments)    Reaction unknown  . Penicillins     REACTION: throat swelling, rash    Family History  Problem Relation Age of Onset  . Heart disease Mother   . Colon cancer      FH  . Diabetes Maternal Aunt     History   Social History  . Marital Status: Widowed    Spouse  Name: N/A    Number of Children: 2  . Years of Education: N/A   Occupational History  .     Social History Main Topics  . Smoking status: Current Every Day Smoker -- 1.0 packs/day for 48 years    Types: Cigarettes  . Smokeless tobacco: Never Used     Comment: Widowed- 2 girls. Lives with daughter (who is nurse at [employer])  . Alcohol Use: No  . Drug Use: No  . Sexually Active: Not on file   Other Topics Concern  . Not on file   Social History Narrative   WIDOWED2 DAUGHTERSLIVES W/DAUGHTER ( WHO IS A NURSE @ [employer])CURRENTLY SMOKESNO ALCOHOL USENO ILLICIT DRUG USEDAILY CAFFEINE USEPPM-MEDTRONICPATIENT SIGNED A DESIGNATED PARTY RELEASE TO ALLOW DAUGHTER, NICOLE COX, TO HAVE ACCESS TO HER MEDICAL RECORDS/INFORMATION. Daphane Shepherd, November 09, 2009 9:19 AM     Constitutional: Pt reports fever and fatigue. Denies headache or abrupt weight changes.  HEENT: Pt reports sore throat. Denies eye pain, eye redness, ear pain, ringing in the ears, wax buildup, runny nose, nasal  congestion, bloody nose. Respiratory: Denies difficulty breathing, shortness of breath, cough or sputum production.   No other specific complaints in a complete review of systems (except as listed in HPI above).   Objective:   Physical Exam   BP 126/76  Pulse 64  Temp 97.1 F (36.2 C) (Oral)  Ht 5\' 6"  (1.676 m)  Wt 126 lb (57.153 kg)  BMI 20.34 kg/m2  SpO2 96% Wt Readings from Last 3 Encounters:  07/19/12 126 lb (57.153 kg)  07/02/12 129 lb 8 oz (58.741 kg)  05/07/12 129 lb 9.6 oz (58.786 kg)    General: Appears her stated age, well developed, well nourished in NAD. HEENT: Head: normal shape and size; Eyes: sclera white, no icterus, conjunctiva pink, PERRLA and EOMs intact; Ears: Tm's gray and intact, normal light reflex; Nose: mucosa pink and moist, septum midline; Throat/Mouth: Teeth present, mucosa erythemaotus and moist, small amount of cream exudate noted on the left tonsillar pillar, lesions or ulcerations noted.  Neck: Normal range of motion. Neck supple, trachea midline. No massses, lumps or thyromegaly present.  Cardiovascular: Normal rate and rhythm. S1,S2 noted.  No murmur, rubs or gallops noted. No JVD or BLE edema. No carotid bruits noted. Pulmonary/Chest: Normal effort and positive vesicular breath sounds. No respiratory distress. No wheezes, rales or ronchi noted.       Assessment & Plan:   Pharyngitis, likely due to strep, new onset with additional workup required:  Rapid strep test Ceftin BID x 10 days Take 1 tsp of honey to soothe throat   RTC as needed or if symptoms persist

## 2012-08-14 ENCOUNTER — Telehealth: Payer: Self-pay | Admitting: Internal Medicine

## 2012-08-14 MED ORDER — METFORMIN HCL ER 500 MG PO TB24: 500.0000 mg | ORAL_TABLET | Freq: Every day | ORAL | Status: DC

## 2012-08-14 MED ORDER — LOSARTAN POTASSIUM 50 MG PO TABS: 50.0000 mg | ORAL_TABLET | Freq: Every day | ORAL | Status: DC

## 2012-08-14 MED ORDER — OMEPRAZOLE 40 MG PO CPDR: 40.0000 mg | DELAYED_RELEASE_CAPSULE | Freq: Every day | ORAL | Status: DC

## 2012-08-14 MED ORDER — ATORVASTATIN CALCIUM 80 MG PO TABS: 80.0000 mg | ORAL_TABLET | Freq: Every day | ORAL | Status: DC

## 2012-08-14 NOTE — Telephone Encounter (Signed)
Pt is calling because her insurance is changing and she will now be under [employer].  Pt will need to get her prescriptions through [facility] (fax #:  [phone]).  Pt is calling to see if Dr Felicity Coyer will send a 90 day supply (refills) of her medications that she prescribes (Losartan, Metformin, Lipitor, and Prilosec).  OFFICE PLEASE FOLLOW UP WITH PT/REFILLS OF MEDICATIONS

## 2012-08-14 NOTE — Telephone Encounter (Signed)
Called pt no answer LMOM sent rx's to [facility]...Raechel Chute

## 2012-08-14 NOTE — Telephone Encounter (Signed)
New problem:   praxada 150 mg    [facility] fax # [phone].

## 2012-08-15 MED ORDER — DABIGATRAN ETEXILATE MESYLATE 150 MG PO CAPS: 150.0000 mg | ORAL_CAPSULE | Freq: Two times a day (BID) | ORAL | Status: DC

## 2012-09-06 ENCOUNTER — Ambulatory Visit (INDEPENDENT_AMBULATORY_CARE_PROVIDER_SITE_OTHER): Payer: Medicare Other | Admitting: Internal Medicine

## 2012-09-06 ENCOUNTER — Encounter: Payer: Self-pay | Admitting: Internal Medicine

## 2012-09-06 VITALS — BP 118/68 | HR 80 | Temp 98.3°F | Resp 10 | Wt 126.0 lb

## 2012-09-06 DIAGNOSIS — F17200 Nicotine dependence, unspecified, uncomplicated: Secondary | ICD-10-CM

## 2012-09-06 DIAGNOSIS — J019 Acute sinusitis, unspecified: Secondary | ICD-10-CM

## 2012-09-06 DIAGNOSIS — Z720 Tobacco use: Secondary | ICD-10-CM

## 2012-09-06 MED ORDER — FLUTICASONE PROPIONATE 50 MCG/ACT NA SUSP: 2.0000 | Freq: Every day | NASAL | Status: DC

## 2012-09-06 MED ORDER — LEVOFLOXACIN 500 MG PO TABS: 500.0000 mg | ORAL_TABLET | Freq: Every day | ORAL | Status: DC

## 2012-09-06 NOTE — Progress Notes (Signed)
  Subjective:    HPI  complains of head cold symptoms, ?sinusitus Onset >3 week ago, initially improved then relapsing and worse symptoms  First associated with rhinorrhea, sneezing, sore throat, mild headache and low grade fever Now sinus pressure and mild-mod nasal congestion, yellow-green discharge No relief with OTC meds Precipitated by sick contacts and weather change  Past Medical History  Diagnosis Date  . Chronic bipolar disorder   . Coronary artery disease     s/p PTCA  . Complete AV block     s/p PPM--MEDTRONIC ADAPTA ADDr01  . Takotsubo syndrome   . GERD (gastroesophageal reflux disease)   . COPD (chronic obstructive pulmonary disease)     TOBACCO ABUSE  . Abscess of liver   . Atrial fibrillation     chronic anticoag - pradaxa  . BIPOLAR AFFECTIVE DISORDER   . DEPRESSION   . DIABETES MELLITUS, TYPE II dx 04/2010  . DYSLIPIDEMIA   . HYPERTENSION   . HYPOTHYROIDISM     hashimoto's  . TOBACCO ABUSE   . Pacemaker-Medtronic-dual-chamber   . Parathyroid related hypercalcemia   . Primary hyperparathyroidism   . Vitamin D deficiency disease     Review of Systems Constitutional: No night sweats, no unexpected weight change Pulmonary: No pleurisy or hemoptysis Cardiovascular: No chest pain or palpitations     Objective:   Physical Exam BP 118/68  Pulse 80  Temp 98.3 F (36.8 C) (Oral)  Resp 10  Wt 126 lb (57.153 kg)  SpO2 95% GEN: mildly ill appearing and audible head/chest congestion HENT: NCAT, mild sinus tenderness bilaterally, nares with thick discharge and turbinate swelling, oropharynx mild erythema and PND, no exudate Eyes: Vision grossly intact, no conjunctivitis Lungs: Clear to auscultation without rhonchi or wheeze, no increased work of breathing Cardiovascular: Regular rate and rhythm, no bilateral edema  Lab Results  Component Value Date   WBC 7.8 10/25/2011   HGB 14.9 10/25/2011   HCT 46.9* 10/25/2011   PLT 220 10/25/2011   GLUCOSE 105*  12/11/2011   CHOL 128 09/07/2010   TRIG 152.0* 09/07/2010   HDL 50.20 09/07/2010   LDLDIRECT 77.5 08/11/2009   LDLCALC 47 09/07/2010   ALT 20 09/07/2010   AST 19 09/07/2010   NA 140 12/11/2011   K 4.4 12/11/2011   CL 103 12/11/2011   CREATININE 0.9 12/11/2011   BUN 15 12/11/2011   CO2 28 12/11/2011   TSH 2.106 04/18/2012   INR 2.0 10/21/2009   HGBA1C 5.9 05/07/2012      Assessment & Plan:  Viral URI > progression to acute sinusitis Cough, postnasal drip related to above Tobacco abuse - 5 minutes today spent counseling patient on unhealthy effects of continued tobacco abuse and encouragement of cessation including medical options available to help the patient quit smoking.    Empiric antibiotics prescribed due to symptom duration greater than 7 days and progression despite OTC symptomatic care Prescription nasal steroids - new prescriptions done Symptomatic care with Tylenol or Advil, decongestants, antihistamine, hydration and rest -  Saline irrigation and salt gargle advised as needed

## 2012-09-06 NOTE — Patient Instructions (Addendum)
It was good to see you today. Levaquin antibiotics and prescription nose spray - Your prescription(s) have been submitted to your pharmacy. Please take as directed and contact our office if you believe you are having problem(s) with the medication(s). Alternate between ibuprofen and tylenol for aches, pain and fever symptoms as discussed Hydrate, rest and call if worse or unimproved Use saline irrigation like Netti PotSinusitis Sinusitis an infection of the air pockets (sinuses) in your face. This can cause puffiness (swelling). It can also cause drainage from your sinuses.   HOME CARE    Only take medicine as told by your doctor.   Drink enough fluids to keep your pee (urine) clear or pale yellow.   Apply moist heat or ice packs for pain relief.   Use salt (saline) nose sprays. The spray will wet the thick fluid in the nose. This can help the sinuses drain.  GET HELP RIGHT AWAY IF:    You have a fever.   Your baby is older than 3 months with a rectal temperature of 102 F (38.9 C) or higher.   Your baby is 23 months old or younger with a rectal temperature of 100.4 F (38 C) or higher.   The pain gets worse.   You get a very bad headache.   You keep throwing up (vomiting).   Your face gets puffy.  MAKE SURE YOU:    Understand these instructions.   Will watch your condition.   Will get help right away if you are not doing well or get worse.  Document Released: 01/10/2008 Document Revised: 10/16/2011 Document Reviewed: 01/10/2008 [facility] Patient Information 2013 [place], [place].   You Can Quit Smoking If you are ready to quit smoking or are thinking about it, congratulations! You have chosen to help yourself be healthier and live longer! There are lots of different ways to quit smoking. Nicotine gum, nicotine patches, a nicotine inhaler, or nicotine nasal spray can help with physical craving. Hypnosis, support groups, and medicines help break the habit of smoking. TIPS TO  GET OFF AND STAY OFF CIGARETTES  Learn to predict your moods. Do not let a bad situation be your excuse to have a cigarette. Some situations in your life might tempt you to have a cigarette.   Ask friends and co-workers not to smoke around you.   Make your home smoke-free.   Never have "just one" cigarette. It leads to wanting another and another. Remind yourself of your decision to quit.   On a card, make a list of your reasons for not smoking. Read it at least the same number of times a day as you have a cigarette. Tell yourself everyday, "I do not want to smoke. I choose not to smoke."   Ask someone at home or work to help you with your plan to quit smoking.   Have something planned after you eat or have a cup of coffee. Take a walk or get other exercise to perk you up. This will help to keep you from overeating.   Try a relaxation exercise to calm you down and decrease your stress. Remember, you may be tense and nervous the first two weeks after you quit. This will pass.   Find new activities to keep your hands busy. Play with a pen, coin, or rubber band. Doodle or draw things on paper.   Brush your teeth right after eating. This will help cut down the craving for the taste of tobacco after meals. You can  try mouthwash too.   Try gum, breath mints, or diet candy to keep something in your mouth.  IF YOU SMOKE AND WANT TO QUIT:  Do not stock up on cigarettes. Never buy a carton. Wait until one pack is finished before you buy another.   Never carry cigarettes with you at work or at home.   Keep cigarettes as far away from you as possible. Leave them with someone else.   Never carry matches or a lighter with you.   Ask yourself, "Do I need this cigarette or is this just a reflex?"   Bet with someone that you can quit. Put cigarette money in a piggy bank every morning. If you smoke, you give up the money. If you do not smoke, by the end of the week, you keep the money.   Keep  trying. It takes 21 days to change a habit!   Talk to your doctor about using medicines to help you quit. These include nicotine replacement gum, lozenges, or skin patches.  Document Released: 05/20/2009 Document Revised: 10/16/2011 Document Reviewed: 05/20/2009 [facility] Patient Information 2013 [place], [place].

## 2012-09-09 ENCOUNTER — Telehealth: Payer: Self-pay | Admitting: Internal Medicine

## 2012-09-09 NOTE — Telephone Encounter (Signed)
Patient Information:  Caller Name: Susann  Phone: [phone]  Patient: Stacy [name], Stacy [name]  Gender: Female  DOB: 10-Jun-1941  Age: 72 Years  PCP: Rene Paci (Adults only)  Office Follow Up:  Does the office need to follow up with this patient?: No  Instructions For The Office: N/A   Symptoms  Reason For Call & Symptoms: Gwenevere was seen in office on 09/05/12 and diagnosed with sinus infection. Was ordered Levofloxacin and received 7 tablets at pharmacy. Oliver states she thought Dr. Felicity Coyer had ordered 10 tabs. Calling to verify prescription was ordered for 7 tabs. Per EPIC  Prescription was ordered for Levofloxacin 500mg  - take one tablet daily. Total of 7 tabs with no refills. Prescription verified and explained to Susannah it was ordered for 7 days. No further question,  Reviewed Health History In EMR: Yes  Reviewed Medications In EMR: Yes  Reviewed Allergies In EMR: Yes  Reviewed Surgeries / Procedures: Yes  Date of Onset of Symptoms: 08/22/2012  Guideline(s) Used:  No Protocol Available - Sick Adult  Disposition Per Guideline:   Home Care  Reason For Disposition Reached:   Patient's symptoms are safe to treat at home per nursing judgment  Advice Given:  Call Back If:  New symptoms develop  You become worse.

## 2012-09-16 ENCOUNTER — Ambulatory Visit (INDEPENDENT_AMBULATORY_CARE_PROVIDER_SITE_OTHER): Payer: Medicare Other | Admitting: *Deleted

## 2012-09-16 ENCOUNTER — Encounter: Payer: Self-pay | Admitting: Internal Medicine

## 2012-09-16 ENCOUNTER — Other Ambulatory Visit: Payer: Self-pay

## 2012-09-16 DIAGNOSIS — I442 Atrioventricular block, complete: Secondary | ICD-10-CM

## 2012-09-16 DIAGNOSIS — Z950 Presence of cardiac pacemaker: Secondary | ICD-10-CM

## 2012-09-19 LAB — REMOTE PACEMAKER DEVICE
AL AMPLITUDE: 2.8 mv
AL IMPEDENCE PM: 619 Ohm
AL THRESHOLD: 0.625 V
RV LEAD IMPEDENCE PM: 1203 Ohm
RV LEAD THRESHOLD: 1.125 V

## 2012-10-01 ENCOUNTER — Encounter: Payer: Self-pay | Admitting: *Deleted

## 2012-10-03 ENCOUNTER — Encounter: Payer: Self-pay | Admitting: *Deleted

## 2012-10-18 ENCOUNTER — Ambulatory Visit: Payer: Medicare Other | Admitting: Neurology

## 2012-10-18 ENCOUNTER — Ambulatory Visit (INDEPENDENT_AMBULATORY_CARE_PROVIDER_SITE_OTHER): Payer: Medicare Other | Admitting: Neurology

## 2012-10-18 ENCOUNTER — Encounter: Payer: Self-pay | Admitting: Neurology

## 2012-10-18 VITALS — BP 130/80 | HR 80 | Temp 97.3°F | Resp 20 | Wt 124.0 lb

## 2012-10-18 DIAGNOSIS — G251 Drug-induced tremor: Secondary | ICD-10-CM

## 2012-10-18 DIAGNOSIS — G250 Essential tremor: Secondary | ICD-10-CM

## 2012-10-18 MED ORDER — PROPRANOLOL HCL ER 60 MG PO CP24: 60.0000 mg | ORAL_CAPSULE | Freq: Every day | ORAL | Status: DC

## 2012-10-18 NOTE — Patient Instructions (Addendum)
1.  Start propranolol LA (Inderal LA)- 60 mg daily 2.  I will follow up with you 10 weeks

## 2012-10-18 NOTE — Progress Notes (Signed)
Dear Dr. Felicity Coyer,  I saw  Stacy [name] back in the movement disorder clinic clinic for her problem with essential/pharmacologic induced tremor.   She previously saw Dr. Modesto Charon.  I reviewed his notes and will be taking over the patients care. As you may recall, she is a 72 y.o. year old female with a history of bipolar disorder, complete heart block s/p pacemaker placement, left hemispheric ischemic stroke, atrial fibrillation and tremor.  The patient reports tremor for years (at few years ago).  She is R hand dominant but the tremor is in both hands equally.  She reports tremor only when she goes to grab something.  She states that it does not interfere with her eating.  She rarely speills liquid.  She is on lithium and has been on that for several years (lithium started first, then tremor later).   Dr Modesto Charon addressed this as a cause or exacerbator of tremor but her psychiatrist felt that she was unable to come off of it from a bipolar standpoint.  She has tried to decrease caffeine without benefit.   She drinks 2 regular size cups of coffee per day.   She has been on propranolol - 20 mg bid started by Dr. Modesto Charon last visit.  She has noted no benefit.  There is no fam hx of tremor.  She has no SE with the propranolol.  She cannot remember the name of her psychiatrist.   Current Outpatient Prescriptions on File Prior to Visit  Medication Sig Dispense Refill  . atorvastatin (LIPITOR) 80 MG tablet Take 1 tablet (80 mg total) by mouth daily.  90 tablet  1  . Calcium Carbonate (CALCIUM 500 PO) Take 1 tablet by mouth daily.       . dabigatran (PRADAXA) 150 MG CAPS Take 1 capsule (150 mg total) by mouth every 12 (twelve) hours.  180 capsule  1  . FLUoxetine (PROZAC) 20 MG capsule Take 1 capsule (20 mg total) by mouth daily.  90 capsule  1  . fluticasone (FLONASE) 50 MCG/ACT nasal spray Place 2 sprays into the nose daily.  16 g  2  . glucose blood test strip Use as instructed      . levofloxacin (LEVAQUIN)  500 MG tablet Take 1 tablet (500 mg total) by mouth daily.  7 tablet  0  . levothyroxine (SYNTHROID, LEVOTHROID) 100 MCG tablet Take 1 tablet (100 mcg total) by mouth daily. SYNTHROID ONLY, BRAND NAME MEDICALLY NECESSARY.  30 tablet  5  . lithium 300 MG capsule Take 300 mg by mouth 2 (two) times daily with a meal.        . losartan (COZAAR) 50 MG tablet Take 1 tablet (50 mg total) by mouth daily.  90 tablet  1  . metFORMIN (GLUCOPHAGE-XR) 500 MG 24 hr tablet Take 1 tablet (500 mg total) by mouth daily with breakfast.  90 tablet  1  . Multiple Vitamin (MULTIVITAMIN) tablet Take 1 tablet by mouth daily.        Marland Kitchen omeprazole (PRILOSEC) 40 MG capsule Take 1 capsule (40 mg total) by mouth daily.  90 capsule  1  . propranolol (INDERAL) 20 MG tablet Take 1 tablet (20 mg total) by mouth 2 (two) times daily.  60 tablet  1   No current facility-administered medications on file prior to visit.    Allergies  Allergen Reactions  . Codeine     REACTION: vomiting  . Latex Other (See Comments)    irritation  .  Metronidazole Other (See Comments)    Reaction unknown  . Penicillins     REACTION: throat swelling, rash   Past Medical History  Diagnosis Date  . Chronic bipolar disorder   . Coronary artery disease     s/p PTCA  . Complete AV block     s/p PPM--MEDTRONIC ADAPTA ADDr01  . Takotsubo syndrome   . GERD (gastroesophageal reflux disease)   . COPD (chronic obstructive pulmonary disease)     TOBACCO ABUSE  . Abscess of liver   . Atrial fibrillation     chronic anticoag - pradaxa  . BIPOLAR AFFECTIVE DISORDER   . DEPRESSION   . DIABETES MELLITUS, TYPE II dx 04/2010  . DYSLIPIDEMIA   . HYPERTENSION   . HYPOTHYROIDISM     hashimoto's  . TOBACCO ABUSE   . Pacemaker-Medtronic-dual-chamber   . Parathyroid related hypercalcemia   . Primary hyperparathyroidism   . Vitamin D deficiency disease    Past Surgical History  Procedure Laterality Date  . Insert / replace / remove pacemaker       MEDTRONIC ADAPTA ADDr01  . Parathyroidectomy      RIGHT INFERIOR  . Abdominal hysterectomy     History   Social History  . Marital Status: Widowed    Spouse Name: N/A    Number of Children: 2  . Years of Education: N/A   Occupational History  .     Social History Main Topics  . Smoking status: Current Every Day Smoker -- 1.00 packs/day for 48 years    Types: Cigarettes  . Smokeless tobacco: Never Used     Comment: Widowed- 2 girls. Lives with daughter (who is nurse at [employer])  . Alcohol Use: No  . Drug Use: No  . Sexually Active: Not on file   Other Topics Concern  . Not on file   Social History Narrative   WIDOWED   2 DAUGHTERS   LIVES W/DAUGHTER ( WHO IS A NURSE @ [employer])   CURRENTLY SMOKES   NO ALCOHOL USE   NO ILLICIT DRUG USE   DAILY CAFFEINE USE      PPM-MEDTRONIC   PATIENT SIGNED A DESIGNATED PARTY RELEASE TO ALLOW DAUGHTER, NICOLE COX, TO HAVE ACCESS TO HER MEDICAL RECORDS/INFORMATION. LATOYA BATTLE, November 09, 2009 9:19 AM     ROS:  Occasional diarrhea.  10 systems were reviewed and  are unremarkable except as reported above.  Exam: . Filed Vitals:   10/18/12 0924  BP: 130/80  Pulse: 80  Temp: 97.3 F (36.3 C)  Resp: 20  Weight: 124 lb (56.246 kg)    Gen:  Appears stated age and in NAD. HEENT:  Normocephalic, atraumatic. The mucous membranes are moist. The superficial temporal arteries are without ropiness or tenderness. Cardiovascular: Regular rate and rhythm. Lungs: Clear to auscultation bilaterally. Neck: There are no carotid bruits noted bilaterally.  NEUROLOGICAL:  Orientation:  The patient is alert and oriented x 3.  Recent and remote memory are intact.  Attention span and concentration are normal.  Able to name objects and repeat without trouble.  Fund of knowledge is appropriate Cranial nerves: There is good facial symmetry. The pupils are equal round and reactive to light bilaterally. Fundoscopic exam reveals clear disc margins  bilaterally. Extraocular muscles are intact and visual fields are full to confrontational testing. Speech is fluent and clear. Soft palate rises symmetrically and there is no tongue deviation. Hearing is intact to conversational tone. Tone: Tone is good throughout. Sensation:  Sensation is intact to light touch and pinprick throughout (facial, trunk, extremities). Vibration is intact at the bilateral big toe. There is no extinction with double simultaneous stimulation. There is no sensory dermatomal level identified. Coordination:  The patient has no difficulty with RAM's or FNF bilaterally. Motor: Strength is 5/5 in the bilateral upper and lower extremities.  Shoulder shrug is equal bilaterally.  There is no pronator drift.  There are no fasciculations noted. DTR's: Deep tendon reflexes are 3/4 at the bilateral biceps, triceps, brachioradialis, patella and 1/4 at the bilateral achilles.  Plantar responses are downgoing bilaterally. Gait and Station: The patient is able to ambulate without difficulty.   Movement examination:  There is very mild postural tremor.  She does have some head titubation that is fairly minimal.  It is in the "yes" direction.  She has some difficulty with Archimedes spirals, more so on the right than the left.  She has some difficulty pouring a full glass of liquid from one cup into the other.  She does still some of the water.    Impression/Recommendations: 1.  Tremor -   This is at least exacerbated if not caused by the lithium being used for bipolar d/o.  Since stopping her lithium is not an option, she would like to continue on the propranolol.  We are going to try a higher dosage since the current dosage has not been effective and she is not having side effects.  I will increase her Inderal to 60 mg daily.  She will remain out of any side effects.  Risks, benefits, side effects and alternative therapies were discussed.  The opportunity to ask questions was given and they  were answered to the best of my ability.  The patient expressed understanding and willingness to follow the outlined treatment protocols. 2.  History of stroke - CTA head and neck unremarkable.  W/U complete, presumed cardioembolic, patient now on pradaxa. 3.  I am going to plan on seeing the patient back in the next 2-3 months.  At that time, we will assess whether or not we higher dosage of the inderal or we'll need to consider changing to something else.  She understands that we may not be able to fully eliminate the tremor given the fact that she is on lithium.

## 2012-10-21 ENCOUNTER — Other Ambulatory Visit: Payer: Self-pay | Admitting: *Deleted

## 2012-10-21 DIAGNOSIS — IMO0001 Reserved for inherently not codable concepts without codable children: Secondary | ICD-10-CM

## 2012-10-24 ENCOUNTER — Other Ambulatory Visit: Payer: Self-pay | Admitting: *Deleted

## 2012-10-24 DIAGNOSIS — Z5181 Encounter for therapeutic drug level monitoring: Secondary | ICD-10-CM

## 2012-10-30 LAB — TSH: TSH: 2.55 u[IU]/mL (ref 0.350–4.500)

## 2012-10-30 LAB — T3, FREE: T3, Free: 2.5 pg/mL (ref 2.3–4.2)

## 2012-10-31 LAB — PARATHYROID HORMONE, INTACT (NO CA): PTH: 36.3 pg/mL (ref 14.0–72.0)

## 2012-10-31 LAB — CALCIUM: Calcium: 10.4 mg/dL (ref 8.4–10.5)

## 2012-10-31 LAB — LITHIUM LEVEL: Lithium Lvl: 0.8 mEq/L (ref 0.80–1.40)

## 2012-10-31 LAB — VITAMIN D 25 HYDROXY (VIT D DEFICIENCY, FRACTURES): Vit D, 25-Hydroxy: 42 ng/mL (ref 30–89)

## 2012-11-06 ENCOUNTER — Ambulatory Visit (INDEPENDENT_AMBULATORY_CARE_PROVIDER_SITE_OTHER): Payer: Medicare Other | Admitting: "Endocrinology

## 2012-11-06 ENCOUNTER — Encounter: Payer: Self-pay | Admitting: "Endocrinology

## 2012-11-06 VITALS — BP 94/59 | HR 72 | Wt 123.7 lb

## 2012-11-06 DIAGNOSIS — G250 Essential tremor: Secondary | ICD-10-CM

## 2012-11-06 DIAGNOSIS — E1169 Type 2 diabetes mellitus with other specified complication: Secondary | ICD-10-CM

## 2012-11-06 DIAGNOSIS — F17200 Nicotine dependence, unspecified, uncomplicated: Secondary | ICD-10-CM

## 2012-11-06 DIAGNOSIS — E049 Nontoxic goiter, unspecified: Secondary | ICD-10-CM

## 2012-11-06 DIAGNOSIS — E038 Other specified hypothyroidism: Secondary | ICD-10-CM

## 2012-11-06 DIAGNOSIS — I10 Essential (primary) hypertension: Secondary | ICD-10-CM

## 2012-11-06 DIAGNOSIS — E063 Autoimmune thyroiditis: Secondary | ICD-10-CM

## 2012-11-06 DIAGNOSIS — R251 Tremor, unspecified: Secondary | ICD-10-CM

## 2012-11-06 DIAGNOSIS — R5383 Other fatigue: Secondary | ICD-10-CM

## 2012-11-06 DIAGNOSIS — G252 Other specified forms of tremor: Secondary | ICD-10-CM

## 2012-11-06 DIAGNOSIS — E11649 Type 2 diabetes mellitus with hypoglycemia without coma: Secondary | ICD-10-CM

## 2012-11-06 DIAGNOSIS — E1065 Type 1 diabetes mellitus with hyperglycemia: Secondary | ICD-10-CM

## 2012-11-06 DIAGNOSIS — E211 Secondary hyperparathyroidism, not elsewhere classified: Secondary | ICD-10-CM

## 2012-11-06 DIAGNOSIS — E559 Vitamin D deficiency, unspecified: Secondary | ICD-10-CM

## 2012-11-06 DIAGNOSIS — R5381 Other malaise: Secondary | ICD-10-CM

## 2012-11-06 LAB — GLUCOSE, POCT (MANUAL RESULT ENTRY): POC Glucose: 118 mg/dl — AB (ref 70–99)

## 2012-11-06 LAB — POCT GLYCOSYLATED HEMOGLOBIN (HGB A1C): Hemoglobin A1C: 5.9

## 2012-11-06 NOTE — Progress Notes (Signed)
Subjective:  Patient Name: Stacy [name] Date of Birth: 02-20-1941  MRN: [id number]  Stacy [name]  presents to the office today for follow-up of her pre-diabetes/T2DM, hypothyroidism, thyroiditis, hypertension, fatigue, secondary hyperparathyroidism, vitamin D deficiency, tobacco abuse, and tremor.  HISTORY OF PRESENT ILLNESS:   [name] is a 72 y.o. Caucasian woman.  [name] was unaccompanied.  1. Patient was first referred to me on 09/04/05 for evaluation and management of chronic hypothyroidism and new hypercalcemia and hyperparathyroidism. In brief, she developed vitamin D deficiency, then secondary hyperparathyroidism, and then tertiary hyperparathyroidism. Her right inferior parathyroid gland was quite enlarged. She was taken to surgery on 12/07/05 for parathyroidectomy. Since that time she's done well, provided that she has continued to take her calcium and vitamin D.  She has had some waxing and waning of her thyroid gland size during these years, which is consistent with inflammatory flare-ups of her Hashimoto's disease. We've increased her thyroid hormone dose slightly over time. She now takes 100 mcg of Synthroid daily. She has remained euthyroid most of the time.  2. The patient has experienced many other problems during the last 6 years, to include bipolar disorder, GERD, fatigue, iron deficiency, 2 heart attacks, hypertension, orthostatic hypotension, atrial fibrillation, and the need for a cardiac pacemaker. We've helped to manage some of those problems and kept her as supplied as we could with samples of antihypertensive medications and Synthroid. She was diagnosed with type 2 diabetes in early 2012 and was started on metformin 500 mg once daily. She was also started on atorvastatin for hyperlipidemia. 3. The patient's last [facility] visit was on 05/07/12. In the interim, she has been healthy. Her new neurologist, dr. Arbutus Leas, increased her propranolol to 60 mg/day yesterday to treat her tremor. Her  memory is OK at times, but is "not worth a darn" at other times. When she pays attention she remembers better. She is taking Synthroid, 100 mcg/day and metformin, 500 mg, once daily. She has significantly reduced her intake of sweets and other carbs, but still consumes a lot of honey. 4. Pertinent Review of Systems:  Constitutional: The patient says, "I feel good.". Her energy level is variable. She is dating her former companion again. Her attitude has been a lot better.  Eyes: Vision is good as long as she wears her glasses. She had another eye exam four months ago and had a good report. There are no other significant eye complaints. Neck: The patient has no complaints of anterior neck swelling, soreness, tenderness,  pressure, discomfort, or difficulty swallowing.  Heart: Her pacemaker is still working. She has no complaints of palpitations, irregular heat beats, chest pain, or chest pressure. Gastrointestinal: She has occasional  constipation if she does not drink enough water and have enough fiber intake. The patient has no complaints of excessive hunger, acid reflux, upset stomach, stomach aches or pains. Arms: She is no longer having pain in her shoulders (trapezius areas) when sitting or standing, but prolonged lying on either shoulder causes trapezius pains.  Legs: Muscle mass and strength seem normal. Her knees and hips are doing fine. There are no complaints of numbness, tingling, or burning. No edema is noted. Feet: There are no obvious foot problems. There are no complaints of numbness, tingling, burning, or pain. No edema is noted. Hypoglycemia: None  5. BG printout: She did not bring her BG meter with her today.    PAST MEDICAL, FAMILY, AND SOCIAL HISTORY:  Past Medical History  Diagnosis Date  . Chronic bipolar disorder   .  Coronary artery disease     s/p PTCA  . Complete AV block     s/p PPM--MEDTR[employer]IC ADAPTA ADDr01  . Takotsubo syndrome   . GERD (gastroesophageal reflux  disease)   . COPD (chronic obstructive pulmonary disease)     TOBACCO ABUSE  . Abscess of liver   . Atrial fibrillation     chronic anticoag - pradaxa  . BIPOLAR AFFECTIVE DISORDER   . DEPRESSION   . DIABETES MELLITUS, TYPE II dx 04/2010  . DYSLIPIDEMIA   . HYPERTENSION   . HYPOTHYROIDISM     hashimoto's  . TOBACCO ABUSE   . Pacemaker-Medtronic-dual-chamber   . Parathyroid related hypercalcemia   . Primary hyperparathyroidism   . Vitamin D deficiency disease     Family History  Problem Relation Age of Onset  . Heart disease Mother   . Colon cancer      FH  . Diabetes Maternal Aunt     Current outpatient prescriptions:atorvastatin (LIPITOR) 80 MG tablet, Take 1 tablet (80 mg total) by mouth daily., Disp: 90 tablet, Rfl: 1;  Calcium Carbonate (CALCIUM 500 PO), Take 1 tablet by mouth daily. , Disp: , Rfl: ;  dabigatran (PRADAXA) 150 MG CAPS, Take 1 capsule (150 mg total) by mouth every 12 (twelve) hours., Disp: 180 capsule, Rfl: 1 FLUoxetine (PROZAC) 20 MG capsule, Take 1 capsule (20 mg total) by mouth daily., Disp: 90 capsule, Rfl: 1;  fluticasone (FLONASE) 50 MCG/ACT nasal spray, Place 2 sprays into the nose daily., Disp: 16 g, Rfl: 2;  glucose blood test strip, Use as instructed, Disp: , Rfl: ;  levothyroxine (SYNTHROID, LEVOTHROID) 100 MCG tablet, Take 1 tablet (100 mcg total) by mouth daily. SYNTHROID ONLY, BRAND NAME MEDICALLY NECESSARY., Disp: 30 tablet, Rfl: 5 lithium 300 MG capsule, Take 300 mg by mouth 2 (two) times daily with a meal.  , Disp: , Rfl: ;  losartan (COZAAR) 50 MG tablet, Take 1 tablet (50 mg total) by mouth daily., Disp: 90 tablet, Rfl: 1;  metFORMIN (GLUCOPHAGE-XR) 500 MG 24 hr tablet, Take 1 tablet (500 mg total) by mouth daily with breakfast., Disp: 90 tablet, Rfl: 1;  Multiple Vitamin (MULTIVITAMIN) tablet, Take 1 tablet by mouth daily.  , Disp: , Rfl:  omeprazole (PRILOSEC) 40 MG capsule, Take 1 capsule (40 mg total) by mouth daily., Disp: 90 capsule, Rfl: 1;   propranolol ER (INDERAL LA) 60 MG 24 hr capsule, Take 1 capsule (60 mg total) by mouth daily., Disp: 90 capsule, Rfl: 1  Allergies as of 11/06/2012 - Review Complete 11/06/2012  Allergen Reaction Noted  . Codeine    . Latex Other (See Comments) 05/03/2012  . Metronidazole Other (See Comments) 03/26/2008  . Penicillins      1. Work and Family: She is no longer working outside the home. Her daughter, Joni Reining, lives with her.  2. Activities: She has not been walking due to the colder weather.    3. Smoking, alcohol, or drugs: She still smokes. She says that she will quit when she drops dead.  4. Primary Care Provider: Rene Paci, MD  REVIEW OF SYSTEMS: There are no other significant problems involving Laine's other body systems.   Objective:  Vital Signs:  BP 94/59  Pulse 72  Wt 123 lb 11.2 oz (56.11 kg)  BMI 19.98 kg/m2   Ht Readings from Last 3 Encounters:  07/19/12 5\' 6"  (1.676 m)  07/02/12 5\' 6"  (1.676 m)  11/14/11 5\' 6"  (1.676 m)   Wt Readings from Last 3  Encounters:  11/06/12 123 lb 11.2 oz (56.11 kg)  10/18/12 124 lb (56.246 kg)  09/06/12 126 lb (57.153 kg)   PHYSICAL EXAM:  Constitutional: The patient appears healthy and well nourished.  She has lost another 6 pounds since her last visit.  Head: Her head tremor is still present, but is reduced since starting propranolol.  Face: The face appears normal.  Eyes: There is no obvious arcus or proptosis. Moisture appears normal. Mouth: The oropharynx and tongue appear normal. Oral moisture is normal. Neck: The neck appears to be visibly normal. No carotid bruits are noted. She has a low-lying thyroid gland. The thyroid gland is probably within normal size at 18-20 grams. Both lobes today are within normal limits for size. The consistency of the thyroid gland is normal. The thyroid gland is not tender to palpation. Lungs: The lungs are clear to auscultation. Air movement is good. Heart: Heart rate and rhythm are  regular. Heart sounds S1 and S2 are normal. I did not appreciate any pathologic cardiac murmurs. Abdomen: The abdomen is slightly enlarged, but less so. Bowel sounds are normal. There is no obvious hepatomegaly, splenomegaly, or other mass effect.  Arms: Muscle size and bulk are normal for age. She has no cog-wheeling of her arms when I passively flex and extend her arms at the elbows. Hands: She has a 1-2+ hand tremor. Phalangeal and metacarpophalangeal joints are normal. Palmar muscles are normal. Palmar skin is normal. Palmar moisture is also normal. Fingernails are somewhat pale. Legs: Muscles appear normal for age. No edema is present. Feet: She has a 2+ right dorsalis pedis pulse and a 1+ left dorsalis pedis pulse.  Neurologic: Strength is normal for age in both the upper and lower extremities. Muscle tone is normal. Sensation to touch is normal in both legs, but slightly decreased in the left heel.  She walked normally, without any of the hesitation and rigidity usually seen with Parkinson's disease.   LAB DATA: Hemoglobin A1c today was 5.9%, compared with 5.9% at last visit and 5.4% at the prior visit.   10/30/12: TSH 2.550, free T4 1.24, free T3 2.5; calcium 10.4, PTH 36.3, 24-hydroxy vitamin D 42; lithium 0.80 (0.80-1.40)   04/18/12: TSH was 2.106, Free T4 was 1.41. Free T3 was 2.8.                    10/25/11: PTH 30.8, calcium 10.4, 25-hydroxy vitamin D 43; WBC 7.8, hemoglobin 14.9, hematocrit 46.9%, iron 71             Assessment and Plan:   ASSESSMENT:  1. Hypothyroid: The patient is euthyroid  on Synthroid, 100 mcg daily. 2. Hashimoto's thyroiditis: Her thyroiditis is clinically quiescent  3. Hypertension: Her blood pressure is relatively low after increasing her propranolol. When she resumes exercising she may develop hypotension and may need to have her losartan dose decreased.    4. T2DM/Prediabetes: Her HbA1c is the same today. She needs to watch her carbohydrate intake. We may  need to increase her metformin to twice daily.   5.  Fatigue: She is really feeling very well. 6.  Hyperparathyroidism and vitamin D deficiency: She is doing very well now. She is very compliant with taking her Caltrate-D and MVI each evening.   7. Tobacco abuse: She needs to stop smoking, but is not motivated to do so at this time. 8. Tremor: Her tremor was not felt to be due to Parkinson's Dz. She is doing better with propranolol.  9. Pallor: Her iron level, hemoglobin, and hematocrit were fine. Pallor has improved over time.   10. Goiter: Her thyroid gland is again within normal size today, c/w less thyroiditis recently.   PLAN:  1. Diagnostic:  TFTs in 6 months. 2. Therapeutic:  Eat Right, exercise right (walk 30-60 minutes per day), and take metformin. Stop smoking. 3. Patient education: We talked a lot about her tremor, her smoking, and her thyroiditis and hypothyroidism. I really leaned on her again to quit smoking "cold [place]".   4. Follow-up: 6 months with me. Please schedule a FU visit with Dr. Felicity Coyer in about July.  Level of Service: This visit lasted in excess of 50 minutes. More than 50% of the visit was devoted to counseling.   David Stall, MD 11/06/2012 9:56 AM

## 2012-11-06 NOTE — Patient Instructions (Signed)
Follow up visit in 6 months. 

## 2012-11-19 ENCOUNTER — Encounter: Payer: Self-pay | Admitting: *Deleted

## 2012-11-20 ENCOUNTER — Encounter: Payer: Self-pay | Admitting: Internal Medicine

## 2012-11-20 ENCOUNTER — Ambulatory Visit (INDEPENDENT_AMBULATORY_CARE_PROVIDER_SITE_OTHER): Payer: Medicare Other | Admitting: Internal Medicine

## 2012-11-20 VITALS — BP 136/85 | HR 89 | Ht 66.0 in | Wt 123.0 lb

## 2012-11-20 DIAGNOSIS — I442 Atrioventricular block, complete: Secondary | ICD-10-CM

## 2012-11-20 DIAGNOSIS — Z950 Presence of cardiac pacemaker: Secondary | ICD-10-CM

## 2012-11-20 DIAGNOSIS — I259 Chronic ischemic heart disease, unspecified: Secondary | ICD-10-CM

## 2012-11-20 DIAGNOSIS — I4891 Unspecified atrial fibrillation: Secondary | ICD-10-CM

## 2012-11-20 LAB — PACEMAKER DEVICE OBSERVATION
AL AMPLITUDE: 5.6 mv
AL IMPEDENCE PM: 520 Ohm
BATTERY VOLTAGE: 2.74 V
RV LEAD AMPLITUDE: 22.4 mv

## 2012-11-20 NOTE — Patient Instructions (Addendum)
Your physician wants you to follow-up in: ONE YEAR WITH DR Logan Bores will receive a reminder letter in the mail two months in advance. If you don't receive a letter, please call our office to schedule the follow-up appointment.   Remote monitoring is used to monitor your Pacemaker of ICD from home. This monitoring reduces the number of office visits required to check your device to one time per year. It allows [place] to keep an eye on the functioning of your device to ensure it is working properly. You are scheduled for a device check from home on 02/24/13. You may send your transmission at any time that day. If you have a wireless device, the transmission will be sent automatically. After your physician reviews your transmission, you will receive a postcard with your next transmission date.

## 2012-11-20 NOTE — Assessment & Plan Note (Signed)
Stable continue current meds 

## 2012-11-20 NOTE — Assessment & Plan Note (Signed)
The patient's device was interrogated.  The information was reviewed. No changes were made in the programming.    

## 2012-11-20 NOTE — Assessment & Plan Note (Signed)
Stable post pacing 

## 2012-11-20 NOTE — Progress Notes (Signed)
Patient Care Team: Newt Lukes, MD as PCP - General Duke Salvia, MD (Cardiology) David Stall, MD (Endocrinology) Johnella Moloney, NP as Nurse Practitioner (Psychiatry) Mardella Layman, MD (Gastroenterology) Billie Ruddy (Optometry)   HPIright her   Stacy [name] is a 72 y.o. female  seen in followup for bradycardia and intermittent complete heart block. She is status post pacemaker implantation.  She has a history of coronary disease and has had problems with recurrent chest pain. We undertook Myoview scan in the 2010. She then underwent catheterization demonstrating no obstruction; her previously implanted stent was patent her ejection fraction was normal   The patient denies chest pain, shortness of breath, nocturnal dyspnea, orthopnea or peripheral edema.  There have been no palpitations, lightheadedness or syncope.   She is confused about a recent kneeling of what sounds like a Synthroid generic formulation. She is on her proprietary brand and saw her endocrinologist 2 weeks ago; at that time her TSH was normal       Past Medical History  Diagnosis Date  . Chronic bipolar disorder   . Coronary artery disease     s/p PTCA  . Complete AV block     s/p PPM--MEDTRONIC ADAPTA ADDr01  . Takotsubo syndrome   . GERD (gastroesophageal reflux disease)   . COPD (chronic obstructive pulmonary disease)     TOBACCO ABUSE  . Abscess of liver   . Atrial fibrillation     chronic anticoag - pradaxa  . BIPOLAR AFFECTIVE DISORDER   . DEPRESSION   . DIABETES MELLITUS, TYPE II dx 04/2010  . DYSLIPIDEMIA   . HYPERTENSION   . HYPOTHYROIDISM     hashimoto's  . TOBACCO ABUSE   . Pacemaker-Medtronic-dual-chamber   . Parathyroid related hypercalcemia   . Primary hyperparathyroidism   . Vitamin D deficiency disease     Past Surgical History  Procedure Laterality Date  . Insert / replace / remove pacemaker      MEDTRONIC ADAPTA ADDr01  . Parathyroidectomy      RIGHT  INFERIOR  . Abdominal hysterectomy      Current Outpatient Prescriptions  Medication Sig Dispense Refill  . atorvastatin (LIPITOR) 80 MG tablet Take 1 tablet (80 mg total) by mouth daily.  90 tablet  1  . Calcium Carbonate (CALCIUM 500 PO) Take 1 tablet by mouth daily.       . dabigatran (PRADAXA) 150 MG CAPS Take 1 capsule (150 mg total) by mouth every 12 (twelve) hours.  180 capsule  1  . FLUoxetine (PROZAC) 20 MG capsule Take 1 capsule (20 mg total) by mouth daily.  90 capsule  1  . fluticasone (FLONASE) 50 MCG/ACT nasal spray Place 2 sprays into the nose daily.  16 g  2  . glucose blood test strip Use as instructed      . lithium 300 MG capsule Take 300 mg by mouth 2 (two) times daily with a meal.        . losartan (COZAAR) 50 MG tablet Take 1 tablet (50 mg total) by mouth daily.  90 tablet  1  . metFORMIN (GLUCOPHAGE-XR) 500 MG 24 hr tablet Take 1 tablet (500 mg total) by mouth daily with breakfast.  90 tablet  1  . Multiple Vitamin (MULTIVITAMIN) tablet Take 1 tablet by mouth daily.        Marland Kitchen omeprazole (PRILOSEC) 40 MG capsule Take 1 capsule (40 mg total) by mouth daily.  90 capsule  1  . propranolol ER (  INDERAL LA) 60 MG 24 hr capsule Take 1 capsule (60 mg total) by mouth daily.  90 capsule  1  . levothyroxine (SYNTHROID, LEVOTHROID) 100 MCG tablet Take 1 tablet (100 mcg total) by mouth daily. SYNTHROID ONLY, BRAND NAME MEDICALLY NECESSARY.  30 tablet  5   No current facility-administered medications for this visit.    Allergies  Allergen Reactions  . Codeine     REACTION: vomiting  . Latex Other (See Comments)    irritation  . Metronidazole Other (See Comments)    Reaction unknown  . Penicillins     REACTION: throat swelling, rash    Review of Systems negative except from HPI and PMH  Physical Exam BP 136/85  Pulse 89  Ht 5\' 6"  (1.676 m)  Wt 123 lb (55.792 kg)  BMI 19.86 kg/m2 Well developed and well nourished in no acute distress HENT normal E scleral and icterus  clear Neck Supple JVP flat; carotids brisk and full Clear to ausculation  Regular rate and rhythm, no murmurs gallops or rub Soft with active bowel sounds No clubbing cyanosis none Edema Alert and oriented, grossly normal motor and sensory function Skin Warm and Dry    Assessment and  Plan

## 2012-11-21 ENCOUNTER — Encounter: Payer: Medicare HMO | Admitting: Internal Medicine

## 2012-12-23 ENCOUNTER — Other Ambulatory Visit: Payer: Self-pay | Admitting: *Deleted

## 2012-12-23 MED ORDER — LOSARTAN POTASSIUM 50 MG PO TABS: 50.0000 mg | ORAL_TABLET | Freq: Every day | ORAL | Status: DC

## 2012-12-23 MED ORDER — METFORMIN HCL ER 500 MG PO TB24: 500.0000 mg | ORAL_TABLET | Freq: Every day | ORAL | Status: DC

## 2012-12-23 MED ORDER — OMEPRAZOLE 40 MG PO CPDR: 40.0000 mg | DELAYED_RELEASE_CAPSULE | Freq: Every day | ORAL | Status: DC

## 2012-12-23 MED ORDER — ATORVASTATIN CALCIUM 80 MG PO TABS: 80.0000 mg | ORAL_TABLET | Freq: Every day | ORAL | Status: DC

## 2012-12-25 ENCOUNTER — Telehealth: Payer: Self-pay

## 2012-12-25 DIAGNOSIS — D179 Benign lipomatous neoplasm, unspecified: Secondary | ICD-10-CM

## 2012-12-25 NOTE — Telephone Encounter (Signed)
Pt called requesting a referral to a general surgeon to evaluate a fatty cyst (Lipoma?) per Dermatologist

## 2012-12-25 NOTE — Telephone Encounter (Signed)
done

## 2012-12-26 NOTE — Telephone Encounter (Signed)
Notified pt with md response.../lmb 

## 2012-12-27 ENCOUNTER — Telehealth: Payer: Self-pay

## 2012-12-27 NOTE — Telephone Encounter (Signed)
Phone call from patient to verify her appt next week. I let her know her appt with Dr Felicity Coyer is at 10 am and her bone density is at 11:30 am. She expressed understanding and had no questions or concerns.

## 2012-12-31 ENCOUNTER — Ambulatory Visit: Payer: Medicare HMO | Admitting: Internal Medicine

## 2012-12-31 ENCOUNTER — Inpatient Hospital Stay: Admission: RE | Admit: 2012-12-31 | Payer: Medicare HMO | Source: Ambulatory Visit

## 2013-01-02 ENCOUNTER — Other Ambulatory Visit: Payer: Self-pay | Admitting: *Deleted

## 2013-01-02 MED ORDER — DABIGATRAN ETEXILATE MESYLATE 150 MG PO CAPS: 150.0000 mg | ORAL_CAPSULE | Freq: Two times a day (BID) | ORAL | Status: DC

## 2013-01-06 ENCOUNTER — Ambulatory Visit: Payer: Self-pay | Admitting: Internal Medicine

## 2013-01-06 DIAGNOSIS — Z0289 Encounter for other administrative examinations: Secondary | ICD-10-CM

## 2013-01-15 ENCOUNTER — Telehealth: Payer: Self-pay | Admitting: *Deleted

## 2013-01-15 MED ORDER — BAYER MICROLET LANCETS MISC: Status: DC

## 2013-01-15 MED ORDER — GLUCOSE BLOOD VI STRP: ORAL_STRIP | Status: DC

## 2013-01-15 NOTE — Telephone Encounter (Signed)
Pt needs refills of Bayer Contour test strips and lancets sent to pharmacy. Rx sent to [facility] per pt's request.

## 2013-01-16 ENCOUNTER — Ambulatory Visit: Payer: Medicare Other | Admitting: Neurology

## 2013-01-16 ENCOUNTER — Encounter: Payer: Self-pay | Admitting: Neurology

## 2013-01-16 ENCOUNTER — Ambulatory Visit (INDEPENDENT_AMBULATORY_CARE_PROVIDER_SITE_OTHER): Payer: Medicare Other | Admitting: Neurology

## 2013-01-16 VITALS — BP 126/70 | HR 80 | Temp 98.0°F | Resp 12 | Ht 66.0 in | Wt 122.0 lb

## 2013-01-16 DIAGNOSIS — R251 Tremor, unspecified: Secondary | ICD-10-CM

## 2013-01-16 DIAGNOSIS — R259 Unspecified abnormal involuntary movements: Secondary | ICD-10-CM

## 2013-01-16 LAB — CBC WITH DIFFERENTIAL/PLATELET
Basophils Absolute: 0.1 10*3/uL (ref 0.0–0.1)
Eosinophils Absolute: 0.2 10*3/uL (ref 0.0–0.7)
Hemoglobin: 13.6 g/dL (ref 12.0–15.0)
Lymphocytes Relative: 34.2 % (ref 12.0–46.0)
Lymphs Abs: 3.1 10*3/uL (ref 0.7–4.0)
MCHC: 33.3 g/dL (ref 30.0–36.0)
Neutro Abs: 5 10*3/uL (ref 1.4–7.7)
RDW: 12.7 % (ref 11.5–14.6)

## 2013-01-16 LAB — COMPREHENSIVE METABOLIC PANEL
ALT: 20 U/L (ref 0–35)
AST: 17 U/L (ref 0–37)
Alkaline Phosphatase: 55 U/L (ref 39–117)
Chloride: 106 mEq/L (ref 96–112)
Creatinine, Ser: 0.8 mg/dL (ref 0.4–1.2)
Total Bilirubin: 0.8 mg/dL (ref 0.3–1.2)

## 2013-01-16 MED ORDER — PROPRANOLOL HCL ER 80 MG PO CP24: 80.0000 mg | ORAL_CAPSULE | Freq: Every day | ORAL | Status: DC

## 2013-01-16 NOTE — Patient Instructions (Addendum)
Follow up with Dr. Arbutus Leas in 3 months.

## 2013-01-16 NOTE — Progress Notes (Signed)
Dear Dr. Felicity Coyer,  I saw  TANIKA BRACCO back in the movement disorder clinic clinic for her problem with essential/pharmacologic induced tremor.   She previously saw Dr. Modesto Charon.  I reviewed his notes and will be taking over the patients care. As you may recall, she is a 72 y.o. year old female with a history of bipolar disorder, complete heart block s/p pacemaker placement, left hemispheric ischemic stroke, atrial fibrillation and tremor.  The patient reports tremor for years (at few years ago).  She is R hand dominant but the tremor is in both hands equally.  She reports tremor only when she goes to grab something.  She states that it does not interfere with her eating.  She rarely speills liquid.  She is on lithium and has been on that for several years (lithium started first, then tremor later).   Dr Modesto Charon addressed this as a cause or exacerbator of tremor but her psychiatrist felt that she was unable to come off of it from a bipolar standpoint.  She has tried to decrease caffeine without benefit.   She drinks 2 regular size cups of coffee per day.    .  There is no fam hx of tremor.  She has no SE with the propranolol.  01/16/2013:  Last visit, her propranolol was increased to 60 mg daily.  She is not sure that this really has been particularly helpful for the tremor, but states that it may have improved slightly.  She denies any side effects.  She has not had any syncopal episodes.  She denies daytime fatigue.  She continues to follow with psychiatry for bipolar disorder.  She does remain on Pradaxa are because of history of stroke.  She denies any falls.  She denies any hematochezia or melena.  She has not had any abnormal bleeding.   Current Outpatient Prescriptions on File Prior to Visit  Medication Sig Dispense Refill  . atorvastatin (LIPITOR) 80 MG tablet Take 1 tablet (80 mg total) by mouth daily.  90 tablet  1  . BAYER MICROLET LANCETS lancets Use as instructed to check blood sugar once  daily dx 250.00  100 each  3  . Calcium Carbonate (CALCIUM 500 PO) Take 1 tablet by mouth daily.       . dabigatran (PRADAXA) 150 MG CAPS Take 1 capsule (150 mg total) by mouth every 12 (twelve) hours.  180 capsule  1  . FLUoxetine (PROZAC) 20 MG capsule Take 1 capsule (20 mg total) by mouth daily.  90 capsule  1  . fluticasone (FLONASE) 50 MCG/ACT nasal spray Place 2 sprays into the nose daily.  16 g  2  . glucose blood (BAYER CONTOUR NEXT TEST) test strip Use as instructed to check blood sugar once daily dx 250.00  100 each  3  . levothyroxine (SYNTHROID, LEVOTHROID) 100 MCG tablet Take 1 tablet (100 mcg total) by mouth daily. SYNTHROID ONLY, BRAND NAME MEDICALLY NECESSARY.  30 tablet  5  . lithium 300 MG capsule Take 300 mg by mouth 2 (two) times daily with a meal.        . losartan (COZAAR) 50 MG tablet Take 1 tablet (50 mg total) by mouth daily.  90 tablet  1  . metFORMIN (GLUCOPHAGE-XR) 500 MG 24 hr tablet Take 1 tablet (500 mg total) by mouth daily with breakfast.  90 tablet  1  . Multiple Vitamin (MULTIVITAMIN) tablet Take 1 tablet by mouth daily.        Marland Kitchen  omeprazole (PRILOSEC) 40 MG capsule Take 1 capsule (40 mg total) by mouth daily.  90 capsule  1  . propranolol ER (INDERAL LA) 60 MG 24 hr capsule Take 1 capsule (60 mg total) by mouth daily.  90 capsule  1   No current facility-administered medications on file prior to visit.    Allergies  Allergen Reactions  . Codeine     REACTION: vomiting  . Latex Other (See Comments)    irritation  . Metronidazole Other (See Comments)    Reaction unknown  . Penicillins     REACTION: throat swelling, rash   Past Medical History  Diagnosis Date  . Chronic bipolar disorder   . Coronary artery disease     s/p PTCA  . Complete AV block     s/p PPM--MEDTRONIC ADAPTA ADDr01  . Takotsubo syndrome   . GERD (gastroesophageal reflux disease)   . COPD (chronic obstructive pulmonary disease)     TOBACCO ABUSE  . Abscess of liver(572.0)   .  Atrial fibrillation     chronic anticoag - pradaxa  . BIPOLAR AFFECTIVE DISORDER   . DEPRESSION   . DIABETES MELLITUS, TYPE II dx 04/2010  . DYSLIPIDEMIA   . HYPERTENSION   . HYPOTHYROIDISM     hashimoto's  . TOBACCO ABUSE   . Pacemaker-Medtronic-dual-chamber   . Parathyroid related hypercalcemia   . Primary hyperparathyroidism   . Vitamin D deficiency disease    Past Surgical History  Procedure Laterality Date  . Insert / replace / remove pacemaker      MEDTRONIC ADAPTA ADDr01  . Parathyroidectomy      RIGHT INFERIOR  . Abdominal hysterectomy     History   Social History  . Marital Status: Widowed    Spouse Name: N/A    Number of Children: 2  . Years of Education: N/A   Occupational History  .     Social History Main Topics  . Smoking status: Current Every Day Smoker -- 1.00 packs/day for 48 years    Types: Cigarettes  . Smokeless tobacco: Never Used     Comment: switched to vapor cigs. Widowed- 2 girls. Lives with daughter (who is nurse at [employer])  . Alcohol Use: No     Comment: "once in a blue moon" when I have [place] food  . Drug Use: No  . Sexually Active: Not on file   Other Topics Concern  . Not on file   Social History Narrative   WIDOWED   2 DAUGHTERS   LIVES W/DAUGHTER   CURRENTLY SMOKES   NO ALCOHOL USE   NO ILLICIT DRUG USE   DAILY CAFFEINE USE      PPM-MEDTRONIC   PATIENT SIGNED A DESIGNATED PARTY RELEASE TO ALLOW DAUGHTER, NICOLE COX, TO HAVE ACCESS TO HER MEDICAL RECORDS/INFORMATION. LATOYA BATTLE, November 09, 2009 9:19 AM     ROS:  Occasional diarrhea.  10 systems were reviewed and  are unremarkable except as reported above.  Exam: . Filed Vitals:   01/16/13 1308  BP: 126/70  Pulse: 80  Temp: 98 F (36.7 C)  Resp: 12  Height: 5\' 6"  (1.676 m)  Weight: 122 lb (55.339 kg)    Gen:  Appears stated age and in NAD. HEENT:  Normocephalic, atraumatic. The mucous membranes are moist. The superficial temporal arteries are without  ropiness or tenderness. Cardiovascular: Regular rate and rhythm. Lungs: Clear to auscultation bilaterally. Neck: There are no carotid bruits noted bilaterally.  NEUROLOGICAL:  Orientation:  The patient is alert and oriented x 3.  Recent and remote memory are intact.  Attention span and concentration are normal.  Able to name objects and repeat without trouble.  Fund of knowledge is appropriate Cranial nerves: There is good facial symmetry. The pupils are equal round and reactive to light bilaterally. Fundoscopic exam reveals clear disc margins bilaterally. Extraocular muscles are intact and visual fields are full to confrontational testing. Speech is fluent and clear. Soft palate rises symmetrically and there is no tongue deviation. Hearing is intact to conversational tone. Tone: Tone is good throughout. Sensation: Sensation is intact to light touch and pinprick throughout (facial, trunk, extremities). Vibration is intact at the bilateral big toe. There is no extinction with double simultaneous stimulation. There is no sensory dermatomal level identified. Coordination:  The patient has no difficulty with RAM's or FNF bilaterally. Motor: Strength is 5/5 in the bilateral upper and lower extremities.  Shoulder shrug is equal bilaterally.  There is no pronator drift.  There are no fasciculations noted. DTR's: Deep tendon reflexes are 3/4 at the bilateral biceps, triceps, brachioradialis, patella and 1/4 at the bilateral achilles.  Plantar responses are downgoing bilaterally. Gait and Station: The patient is able to ambulate without difficulty.   Movement examination:  There is very mild postural tremor.  She does have some head titubation that is fairly minimal.  It is in the "yes" direction.  She has some difficulty with Archimedes spirals, more so on the right than the left.  She has very little difficulty pouring a glass of liquid from one cup into the other today.    LABS:  Lab Results   Component Value Date   WBC 7.8 10/25/2011   HGB 14.9 10/25/2011   HCT 46.9* 10/25/2011   MCV 97.5 10/25/2011   PLT 220 10/25/2011     Chemistry      Component Value Date/Time   NA 140 12/11/2011 1024   K 4.4 12/11/2011 1024   CL 103 12/11/2011 1024   CO2 28 12/11/2011 1024   BUN 15 12/11/2011 1024   CREATININE 0.9 12/11/2011 1024      Component Value Date/Time   CALCIUM 10.4 10/30/2012 1201   CALCIUM 10.4 10/25/2011 1405   ALKPHOS 75 09/07/2010 1059   AST 19 09/07/2010 1059   ALT 20 09/07/2010 1059   BILITOT 0.5 09/07/2010 1059     Lab Results  Component Value Date   TSH 2.550 10/30/2012   Lab Results  Component Value Date   LITHIUM 0.80 10/30/2012   NA 140 12/11/2011   BUN 15 12/11/2011   CREATININE 0.9 12/11/2011   TSH 2.550 10/30/2012   WBC 7.8 10/25/2011     Impression/Recommendations: 1.  Tremor -   This is at least exacerbated if not caused by the lithium being used for bipolar d/o.  Since stopping her lithium is not an option, she would like to continue on the propranolol.  We are going to try a higher dosage since the current dosage has not been effective and she is not having side effects.  I will increase her Inderal to 80 mg daily.  She will let me know of any side effects.  Risks, benefits, side effects and alternative therapies were discussed.  The opportunity to ask questions was given and they were answered to the best of my ability.  The patient expressed understanding and willingness to follow the outlined treatment protocols.  -I. will check her CBC and chemistry panel today. 2.  History  of stroke - CTA head and neck unremarkable.  W/U complete, presumed cardioembolic, patient now on pradaxa. 3.  I am going to plan on seeing the patient back in the next-3 months.  At that time, we will assess whether or not we use a higher dosage of the inderal or we'll need to consider changing to something else.  She understands that we may not be able to fully eliminate the tremor given the fact that  she is on lithium.  Time in the room today was 25 minutes.

## 2013-01-20 ENCOUNTER — Encounter (HOSPITAL_COMMUNITY): Payer: Self-pay | Admitting: Respiratory Therapy

## 2013-01-20 ENCOUNTER — Ambulatory Visit (INDEPENDENT_AMBULATORY_CARE_PROVIDER_SITE_OTHER): Payer: Medicare Other | Admitting: Surgery

## 2013-01-20 ENCOUNTER — Telehealth: Payer: Self-pay | Admitting: Internal Medicine

## 2013-01-20 ENCOUNTER — Encounter (INDEPENDENT_AMBULATORY_CARE_PROVIDER_SITE_OTHER): Payer: Self-pay | Admitting: Surgery

## 2013-01-20 VITALS — BP 128/68 | HR 64 | Temp 98.4°F | Resp 15 | Ht 66.0 in | Wt 121.6 lb

## 2013-01-20 DIAGNOSIS — M7989 Other specified soft tissue disorders: Secondary | ICD-10-CM

## 2013-01-20 DIAGNOSIS — M799 Soft tissue disorder, unspecified: Secondary | ICD-10-CM

## 2013-01-20 NOTE — Progress Notes (Signed)
General Surgery - [facility]  Chief Complaint  Patient presents with  . New Evaluation    eval cyst left side    HISTORY: Patient is a 72-year-old female known to my practice from previous parathyroid surgery. She has developed a soft tissue mass in the left flank which is gradually increased in size. It causes some discomfort due to its location over the rib cage. She is uncomfortable when lying down. She has had no such lesions removed in the past. She presents today for consideration for surgical excision.  Past Medical History  Diagnosis Date  . Chronic bipolar disorder   . Coronary artery disease     s/p PTCA  . Complete AV block     s/p PPM--MEDTRONIC ADAPTA ADDr01  . Takotsubo syndrome   . GERD (gastroesophageal reflux disease)   . COPD (chronic obstructive pulmonary disease)     TOBACCO ABUSE  . Abscess of liver(572.0)   . Atrial fibrillation     chronic anticoag - pradaxa  . BIPOLAR AFFECTIVE DISORDER   . DEPRESSION   . DIABETES MELLITUS, TYPE II dx 04/2010  . DYSLIPIDEMIA   . HYPERTENSION   . HYPOTHYROIDISM     hashimoto's  . TOBACCO ABUSE   . Pacemaker-Medtronic-dual-chamber   . Parathyroid related hypercalcemia   . Primary hyperparathyroidism   . Vitamin D deficiency disease   . Arthritis   . Clotting disorder   . Heart murmur   . Stroke      Current Outpatient Prescriptions  Medication Sig Dispense Refill  . atorvastatin (LIPITOR) 80 MG tablet Take 1 tablet (80 mg total) by mouth daily.  90 tablet  1  . BAYER MICROLET LANCETS lancets Use as instructed to check blood sugar once daily dx 250.00  100 each  3  . Calcium Carbonate (CALCIUM 500 PO) Take 1 tablet by mouth daily.       . dabigatran (PRADAXA) 150 MG CAPS Take 1 capsule (150 mg total) by mouth every 12 (twelve) hours.  180 capsule  1  . FLUoxetine (PROZAC) 20 MG capsule Take 1 capsule (20 mg total) by mouth daily.  90 capsule  1  . glucose blood (BAYER CONTOUR NEXT TEST) test  strip Use as instructed to check blood sugar once daily dx 250.00  100 each  3  . levothyroxine (SYNTHROID, LEVOTHROID) 100 MCG tablet Take 1 tablet (100 mcg total) by mouth daily. SYNTHROID ONLY, BRAND NAME MEDICALLY NECESSARY.  30 tablet  5  . lithium 300 MG capsule Take 300 mg by mouth 2 (two) times daily with a meal.        . losartan (COZAAR) 50 MG tablet Take 1 tablet (50 mg total) by mouth daily.  90 tablet  1  . metFORMIN (GLUCOPHAGE-XR) 500 MG 24 hr tablet Take 1 tablet (500 mg total) by mouth daily with breakfast.  90 tablet  1  . Multiple Vitamin (MULTIVITAMIN) tablet Take 1 tablet by mouth daily.        . omeprazole (PRILOSEC) 40 MG capsule Take 1 capsule (40 mg total) by mouth daily.  90 capsule  1  . propranolol ER (INDERAL LA) 80 MG 24 hr capsule Take 1 capsule (80 mg total) by mouth daily.  30 capsule  5   No current facility-administered medications for this visit.     Allergies  Allergen Reactions  . Codeine     REACTION: vomiting  . Latex Other (See Comments)    irritation  . Metronidazole   Other (See Comments)    Reaction unknown  . Penicillins     REACTION: throat swelling, rash     Family History  Problem Relation Age of Onset  . CAD Mother   . Colon cancer      FH  . Diabetes Maternal Aunt   . CAD Father   . Diabetes type II Sister      History   Social History  . Marital Status: Single    Spouse Name: N/A    Number of Children: 2  . Years of Education: N/A   Occupational History  .     Social History Main Topics  . Smoking status: Current Every Day Smoker -- 1.00 packs/day for 48 years    Types: Cigarettes  . Smokeless tobacco: Never Used     Comment: switched to vapor cigs. Widowed- 2 girls. Lives with daughter (who is nurse at [employer])  . Alcohol Use: No     Comment: "once in a blue moon" when I have [place] food  . Drug Use: No  . Sexually Active: None   Other Topics Concern  . None   Social History Narrative   WIDOWED   2  DAUGHTERS   LIVES W/DAUGHTER   CURRENTLY SMOKES   NO ALCOHOL USE   NO ILLICIT DRUG USE   DAILY CAFFEINE USE      PPM-MEDTRONIC   PATIENT SIGNED A DESIGNATED PARTY RELEASE TO ALLOW DAUGHTER, NICOLE COX, TO HAVE ACCESS TO HER MEDICAL RECORDS/INFORMATION. LATOYA BATTLE, November 09, 2009 9:19 AM     REVIEW OF SYSTEMS - PERTINENT POSITIVES ONLY: Discomfort when lying or sitting with pressure against the mass. Gradual increase in size.  EXAM: Filed Vitals:   01/20/13 0937  BP: 128/68  Pulse: 64  Temp: 98.4 F (36.9 C)  Resp: 15    HEENT: normocephalic; pupils equal and reactive; sclerae clear; dentition good; mucous membranes moist NECK:  Well-healed anterior surgical incision; symmetric on extension; no palpable anterior or posterior cervical lymphadenopathy; no supraclavicular masses; no tenderness CHEST: clear to auscultation bilaterally without rales, rhonchi, or wheezes CARDIAC: regular rate and rhythm without significant murmur; peripheral pulses are full BACK:  Soft tissue mass and left costovertebral angle overlying the rib cage measuring 4 cm x 3 cm x 3 cm in size, mobile, minimally tender EXT:  non-tender without edema; no deformity NEURO: no gross focal deficits; no sign of tremor   LABORATORY RESULTS: See [facility] (CHL-Epic) for most recent results   RADIOLOGY RESULTS: See [facility] (CHL-Epic) for most recent results   IMPRESSION: Soft tissue mass left flank, 4 x 3 x 3 cm, likely lipoma  PLAN: Patient and I discussed surgical excision. This can be done under sedation and local anesthesia as an outpatient surgical procedure. We will make arrangements for surgery in the near future. She will need to discontinue her anticoagulant for several days prior to the procedure. She understands and wishes to proceed.  The risks and benefits of the procedure have been discussed at length with the patient.  The patient understands the proposed procedure, potential  alternative treatments, and the course of recovery to be expected.  All of the patient's questions have been answered at this time.  The patient wishes to proceed with surgery.  Bobak Oguinn M. Tramel Westbrook, MD, FACS General & Endocrine Surgery [facility]   Visit Diagnoses: 1. Soft tissue mass, left flank     Primary Care Physician: Valerie Leschber, MD  

## 2013-01-20 NOTE — Patient Instructions (Signed)

## 2013-01-20 NOTE — Telephone Encounter (Signed)
Pt needs to know if she can come off Pradaxa 5 days prior to surgery - having a fatty tumor removed from her side by Dr Gerrit Friends.  Aware I will have Dr Graciela Husbands review and we will call back ASAP.

## 2013-01-20 NOTE — Telephone Encounter (Signed)
New Problem  Pt states she is having surgery on June 23 and wants to know if it is ok for her to be off her PRADAXA for 5 days?

## 2013-01-21 NOTE — Telephone Encounter (Signed)
Follow-up ° ° ° °Patient called in wanting to know if she can stop her Pradaxa. Please call back. ° °

## 2013-01-22 ENCOUNTER — Telehealth (INDEPENDENT_AMBULATORY_CARE_PROVIDER_SITE_OTHER): Payer: Self-pay

## 2013-01-22 NOTE — Telephone Encounter (Signed)
Spoke with pt, aware of dr klein's recommendations. 

## 2013-01-22 NOTE — Telephone Encounter (Signed)
Follow-up    Patient called in wanting to know if she can stop her Pradaxa. Please call back.

## 2013-01-22 NOTE — Telephone Encounter (Signed)
Discussed with dr Graciela Husbands, okay for pt to hold pradaxa 3 days prior to procedure. She can restart with clearance from the surgeon. Left message for pt to call

## 2013-01-22 NOTE — Telephone Encounter (Signed)
Pt called stating she has not heard from Dr Clide Cliff re: stopping blood thinner 5 days pre op. Reviewed with Dr Gerrit Friends. Per Dr Ardine Eng order pt advised since being treated for Afib ok to stop her prodaxa today. Pt states she understands.

## 2013-01-22 NOTE — Telephone Encounter (Signed)
F/u   pts daughter returned your call-when you call back if it goes to vm can you please leave a # to call you back because your # is showing up as unknown

## 2013-01-24 ENCOUNTER — Encounter (HOSPITAL_COMMUNITY)
Admission: RE | Admit: 2013-01-24 | Discharge: 2013-01-24 | Disposition: A | Discharge status: Home / Self Care | Payer: Medicare Other | Source: Ambulatory Visit | Attending: Surgery | Admitting: Surgery

## 2013-01-24 ENCOUNTER — Encounter (HOSPITAL_COMMUNITY): Payer: Self-pay

## 2013-01-24 HISTORY — DX: Malignant (primary) neoplasm, unspecified: C80.1

## 2013-01-24 LAB — BASIC METABOLIC PANEL
Calcium: 10 mg/dL (ref 8.4–10.5)
GFR calc Af Amer: >90 mL/min (ref >90)
GFR calc non Af Amer: 82 mL/min — ABNORMAL LOW (ref >90)
Glucose, Bld: 103 mg/dL — ABNORMAL HIGH (ref 70–99)
Sodium: 139 mEq/L (ref 135–145)

## 2013-01-24 LAB — CBC
MCH: 31.7 pg (ref 26.0–34.0)
Platelets: 202 10*3/uL (ref 150–400)
RBC: 4.61 MIL/uL (ref 3.87–5.11)
WBC: 11.4 10*3/uL — ABNORMAL HIGH (ref 4.0–10.5)

## 2013-01-24 LAB — SURGICAL PCR SCREEN
MRSA, PCR: NEGATIVE
Staphylococcus aureus: NEGATIVE

## 2013-01-24 NOTE — Pre-Procedure Instructions (Signed)
Stacy [name]  01/24/2013   Your procedure is scheduled on:  01/27/2013- MONDAY  Report to [facility] at 7:30 AM.  Call this number if you have problems the morning of surgery: [phone]   Remember:   Do not eat food or drink liquids after midnight.  SUNDAY  Take these medicines the morning of surgery with A SIP OF WATER: PROZAC, Thyroid medicine, Lithium, Omeprazole, Propranolol   Do not wear jewelry, make-up or nail polish.  Do not wear lotions, powders, or perfumes. You may wear deodorant.  Do not shave 48 hours prior to surgery.  Do not bring valuables to the hospital.  [facility] is not responsible  for any belongings or valuables.  Contacts, dentures or bridgework may not be worn into surgery.  Leave suitcase in the car. After surgery it may be brought to your room.  For patients admitted to the hospital, checkout time is 11:00 AM the day of  discharge.   Patients discharged the day of surgery will not be allowed to drive  home.  Name and phone number of your driver: /w daughter  Special Instructions: Shower using CHG 2 nights before surgery and the night before surgery.  If you shower the day of surgery use CHG.  Use special wash - you have one bottle of CHG for all showers.  You should use approximately 1/3 of the bottle for each shower.   Please read over the following fact sheets that you were given: Pain Booklet, Coughing and Deep Breathing, MRSA Information and Surgical Site Infection Prevention

## 2013-01-24 NOTE — Progress Notes (Signed)
Call to-from [employer], spoke with Leta Jungling, she is aware of order fr. Dr. Graciela Husbands.

## 2013-01-26 MED ORDER — CIPROFLOXACIN IN D5W 400 MG/200ML IV SOLN
400.0000 mg | INTRAVENOUS | Status: AC
  Administered 2013-01-27: 400 mg via INTRAVENOUS
  Filled 2013-01-26: qty 200

## 2013-01-27 ENCOUNTER — Ambulatory Visit (HOSPITAL_COMMUNITY)
Admission: RE | Admit: 2013-01-27 | Discharge: 2013-01-27 | Disposition: A | Discharge status: Home / Self Care | Payer: Medicare Other | Source: Ambulatory Visit | Attending: Surgery | Admitting: Surgery

## 2013-01-27 ENCOUNTER — Encounter (HOSPITAL_COMMUNITY): Payer: Self-pay | Admitting: Anesthesiology

## 2013-01-27 ENCOUNTER — Ambulatory Visit (HOSPITAL_COMMUNITY): Payer: Medicare Other | Admitting: Anesthesiology

## 2013-01-27 ENCOUNTER — Encounter (HOSPITAL_COMMUNITY)
Admission: RE | Disposition: A | Discharge status: Home / Self Care | Payer: Self-pay | Source: Ambulatory Visit | Attending: Surgery

## 2013-01-27 DIAGNOSIS — E559 Vitamin D deficiency, unspecified: Secondary | ICD-10-CM | POA: Insufficient documentation

## 2013-01-27 DIAGNOSIS — Z91040 Latex allergy status: Secondary | ICD-10-CM | POA: Insufficient documentation

## 2013-01-27 DIAGNOSIS — F17200 Nicotine dependence, unspecified, uncomplicated: Secondary | ICD-10-CM | POA: Insufficient documentation

## 2013-01-27 DIAGNOSIS — D689 Coagulation defect, unspecified: Secondary | ICD-10-CM | POA: Insufficient documentation

## 2013-01-27 DIAGNOSIS — E119 Type 2 diabetes mellitus without complications: Secondary | ICD-10-CM | POA: Insufficient documentation

## 2013-01-27 DIAGNOSIS — E785 Hyperlipidemia, unspecified: Secondary | ICD-10-CM | POA: Insufficient documentation

## 2013-01-27 DIAGNOSIS — E039 Hypothyroidism, unspecified: Secondary | ICD-10-CM | POA: Insufficient documentation

## 2013-01-27 DIAGNOSIS — Z880 Allergy status to penicillin: Secondary | ICD-10-CM | POA: Insufficient documentation

## 2013-01-27 DIAGNOSIS — Z885 Allergy status to narcotic agent status: Secondary | ICD-10-CM | POA: Insufficient documentation

## 2013-01-27 DIAGNOSIS — M129 Arthropathy, unspecified: Secondary | ICD-10-CM | POA: Insufficient documentation

## 2013-01-27 DIAGNOSIS — J4489 Other specified chronic obstructive pulmonary disease: Secondary | ICD-10-CM | POA: Insufficient documentation

## 2013-01-27 DIAGNOSIS — Z8673 Personal history of transient ischemic attack (TIA), and cerebral infarction without residual deficits: Secondary | ICD-10-CM | POA: Insufficient documentation

## 2013-01-27 DIAGNOSIS — R011 Cardiac murmur, unspecified: Secondary | ICD-10-CM | POA: Insufficient documentation

## 2013-01-27 DIAGNOSIS — E210 Primary hyperparathyroidism: Secondary | ICD-10-CM | POA: Insufficient documentation

## 2013-01-27 DIAGNOSIS — I10 Essential (primary) hypertension: Secondary | ICD-10-CM | POA: Insufficient documentation

## 2013-01-27 DIAGNOSIS — Z79899 Other long term (current) drug therapy: Secondary | ICD-10-CM | POA: Insufficient documentation

## 2013-01-27 DIAGNOSIS — I2510 Atherosclerotic heart disease of native coronary artery without angina pectoris: Secondary | ICD-10-CM | POA: Insufficient documentation

## 2013-01-27 DIAGNOSIS — F3130 Bipolar disorder, current episode depressed, mild or moderate severity, unspecified: Secondary | ICD-10-CM | POA: Insufficient documentation

## 2013-01-27 DIAGNOSIS — Z888 Allergy status to other drugs, medicaments and biological substances status: Secondary | ICD-10-CM | POA: Insufficient documentation

## 2013-01-27 DIAGNOSIS — I442 Atrioventricular block, complete: Secondary | ICD-10-CM | POA: Insufficient documentation

## 2013-01-27 DIAGNOSIS — Z950 Presence of cardiac pacemaker: Secondary | ICD-10-CM | POA: Insufficient documentation

## 2013-01-27 DIAGNOSIS — M7989 Other specified soft tissue disorders: Secondary | ICD-10-CM

## 2013-01-27 DIAGNOSIS — D1739 Benign lipomatous neoplasm of skin and subcutaneous tissue of other sites: Secondary | ICD-10-CM

## 2013-01-27 DIAGNOSIS — K219 Gastro-esophageal reflux disease without esophagitis: Secondary | ICD-10-CM | POA: Insufficient documentation

## 2013-01-27 DIAGNOSIS — R229 Localized swelling, mass and lump, unspecified: Secondary | ICD-10-CM | POA: Insufficient documentation

## 2013-01-27 DIAGNOSIS — J449 Chronic obstructive pulmonary disease, unspecified: Secondary | ICD-10-CM | POA: Insufficient documentation

## 2013-01-27 HISTORY — PX: MASS EXCISION: SHX2000

## 2013-01-27 LAB — GLUCOSE, CAPILLARY: Glucose-Capillary: 90 mg/dL (ref 70–99)

## 2013-01-27 SURGERY — EXCISION MASS
Anesthesia: Monitor Anesthesia Care | Site: Flank | Laterality: Left | Wound class: Clean

## 2013-01-27 MED ORDER — ONDANSETRON HCL 4 MG/2ML IJ SOLN
4.0000 mg | Freq: Once | INTRAMUSCULAR | Status: AC | PRN
  Administered 2013-01-27: 4 mg via INTRAVENOUS

## 2013-01-27 MED ORDER — BUPIVACAINE-EPINEPHRINE PF 0.25-1:200000 % IJ SOLN
INTRAMUSCULAR | Status: AC
  Filled 2013-01-27: qty 30

## 2013-01-27 MED ORDER — LIDOCAINE HCL (CARDIAC) 20 MG/ML IV SOLN
INTRAVENOUS | Status: DC | PRN
  Administered 2013-01-27: 50 mg via INTRAVENOUS

## 2013-01-27 MED ORDER — HYDROMORPHONE HCL PF 1 MG/ML IJ SOLN
0.2500 mg | INTRAMUSCULAR | Status: DC | PRN
  Administered 2013-01-27: 0.5 mg via INTRAVENOUS

## 2013-01-27 MED ORDER — BUPIVACAINE-EPINEPHRINE 0.25% -1:200000 IJ SOLN
INTRAMUSCULAR | Status: DC | PRN
  Administered 2013-01-27: 17 mL

## 2013-01-27 MED ORDER — ACETAMINOPHEN 10 MG/ML IV SOLN
1000.0000 mg | Freq: Once | INTRAVENOUS | Status: AC | PRN
  Administered 2013-01-27: 1000 mg via INTRAVENOUS

## 2013-01-27 MED ORDER — PROPOFOL INFUSION 10 MG/ML OPTIME
INTRAVENOUS | Status: DC | PRN
  Administered 2013-01-27: 25 ug/kg/min via INTRAVENOUS

## 2013-01-27 MED ORDER — HYDROCODONE-ACETAMINOPHEN 5-325 MG PO TABS: 1.0000 | ORAL_TABLET | ORAL | Status: DC | PRN

## 2013-01-27 MED ORDER — 0.9 % SODIUM CHLORIDE (POUR BTL) OPTIME
TOPICAL | Status: DC | PRN
  Administered 2013-01-27: 1000 mL

## 2013-01-27 MED ORDER — HYDROMORPHONE HCL PF 1 MG/ML IJ SOLN
INTRAMUSCULAR | Status: AC
  Filled 2013-01-27: qty 1

## 2013-01-27 MED ORDER — ACETAMINOPHEN 10 MG/ML IV SOLN
INTRAVENOUS | Status: AC
  Filled 2013-01-27: qty 100

## 2013-01-27 MED ORDER — LACTATED RINGERS IV SOLN
INTRAVENOUS | Status: DC | PRN
  Administered 2013-01-27: 10:00:00 via INTRAVENOUS

## 2013-01-27 MED ORDER — ONDANSETRON HCL 4 MG/2ML IJ SOLN
INTRAMUSCULAR | Status: AC
  Filled 2013-01-27: qty 2

## 2013-01-27 MED ORDER — FENTANYL CITRATE 0.05 MG/ML IJ SOLN
INTRAMUSCULAR | Status: DC | PRN
  Administered 2013-01-27: 50 ug via INTRAVENOUS

## 2013-01-27 MED ORDER — MIDAZOLAM HCL 5 MG/5ML IJ SOLN
INTRAMUSCULAR | Status: DC | PRN
  Administered 2013-01-27: 1 mg via INTRAVENOUS

## 2013-01-27 SURGICAL SUPPLY — 34 items
BENZOIN TINCTURE PRP APPL 2/3 (GAUZE/BANDAGES/DRESSINGS) ×2 IMPLANT
BLADE SURG 10 STRL SS (BLADE) ×2 IMPLANT
BLADE SURG 15 STRL LF DISP TIS (BLADE) ×1 IMPLANT
BLADE SURG 15 STRL SS (BLADE) ×1
CHLORAPREP W/TINT 26ML (MISCELLANEOUS) ×2 IMPLANT
CLOTH BEACON ORANGE TIMEOUT ST (SAFETY) ×2 IMPLANT
CLSR STERI-STRIP ANTIMIC 1/2X4 (GAUZE/BANDAGES/DRESSINGS) ×2 IMPLANT
COVER SURGICAL LIGHT HANDLE (MISCELLANEOUS) ×2 IMPLANT
DRAPE EXTREMITY T 121X128X90 (DRAPE) ×2 IMPLANT
DRAPE UTILITY 15X26 W/TAPE STR (DRAPE) ×4 IMPLANT
ELECT CAUTERY BLADE 6.4 (BLADE) ×2 IMPLANT
ELECT REM PT RETURN 9FT ADLT (ELECTROSURGICAL) ×2
ELECTRODE REM PT RTRN 9FT ADLT (ELECTROSURGICAL) ×1 IMPLANT
GAUZE SPONGE 4X4 16PLY XRAY LF (GAUZE/BANDAGES/DRESSINGS) ×2 IMPLANT
GOWN STRL NON-REIN LRG LVL3 (GOWN DISPOSABLE) ×2 IMPLANT
GOWN STRL REIN XL XLG (GOWN DISPOSABLE) ×2 IMPLANT
KIT BASIN OR (CUSTOM PROCEDURE TRAY) ×2 IMPLANT
KIT ROOM TURNOVER OR (KITS) ×2 IMPLANT
NEEDLE HYPO 25GX1X1/2 BEV (NEEDLE) ×2 IMPLANT
NS IRRIG 1000ML POUR BTL (IV SOLUTION) ×2 IMPLANT
PACK SURGICAL SETUP 50X90 (CUSTOM PROCEDURE TRAY) ×2 IMPLANT
PAD ARMBOARD 7.5X6 YLW CONV (MISCELLANEOUS) ×2 IMPLANT
PENCIL BUTTON HOLSTER BLD 10FT (ELECTRODE) ×2 IMPLANT
SPECIMEN JAR SMALL (MISCELLANEOUS) ×2 IMPLANT
SPONGE GAUZE 4X4 12PLY (GAUZE/BANDAGES/DRESSINGS) ×2 IMPLANT
SPONGE LAP 18X18 X RAY DECT (DISPOSABLE) ×2 IMPLANT
STRIP CLOSURE SKIN 1/2X4 (GAUZE/BANDAGES/DRESSINGS) ×2 IMPLANT
SUT MNCRL AB 4-0 PS2 18 (SUTURE) ×2 IMPLANT
SUT VIC AB 3-0 SH 27 (SUTURE) ×1
SUT VIC AB 3-0 SH 27XBRD (SUTURE) ×1 IMPLANT
SYR BULB 3OZ (MISCELLANEOUS) ×2 IMPLANT
SYR CONTROL 10ML LL (SYRINGE) ×2 IMPLANT
TAPE CLOTH SURG 4X10 WHT LF (GAUZE/BANDAGES/DRESSINGS) ×2 IMPLANT
TOWEL OR 17X26 10 PK STRL BLUE (TOWEL DISPOSABLE) ×2 IMPLANT

## 2013-01-27 NOTE — Transfer of Care (Signed)
Immediate Anesthesia Transfer of Care Note  Patient: Stacy [name]  Procedure(s) Performed: Procedure(s): EXCISION MASS LEFT FLANK (Left)  Patient Location: PACU  Anesthesia Type:MAC  Level of Consciousness: awake, alert  and oriented  Airway & Oxygen Therapy: Patient Spontanous Breathing and Patient connected to nasal cannula oxygen  Post-op Assessment: Report given to PACU RN and Post -op Vital signs reviewed and stable  Post vital signs: Reviewed and stable  Complications: No apparent anesthesia complications

## 2013-01-27 NOTE — H&P (View-Only) (Signed)
General Surgery [facility]  Chief Complaint  Patient presents with  . New Evaluation    eval cyst left side    HISTORY: Patient is a 72 year old female known to my practice from previous parathyroid surgery. She has developed a soft tissue mass in the left flank which is gradually increased in size. It causes some discomfort due to its location over the rib cage. She is uncomfortable when lying down. She has had no such lesions removed in the past. She presents today for consideration for surgical excision.  Past Medical History  Diagnosis Date  . Chronic bipolar disorder   . Coronary artery disease     s/p PTCA  . Complete AV block     s/p PPM--[employer] ADAPTA ADDr01  . Takotsubo syndrome   . GERD (gastroesophageal reflux disease)   . COPD (chronic obstructive pulmonary disease)     TOBACCO ABUSE  . Abscess of liver(572.0)   . Atrial fibrillation     chronic anticoag - pradaxa  . BIPOLAR AFFECTIVE DISORDER   . DEPRESSION   . DIABETES MELLITUS, TYPE II dx 04/2010  . DYSLIPIDEMIA   . HYPERTENSION   . HYPOTHYROIDISM     hashimoto's  . TOBACCO ABUSE   . Pacemaker-[employer]tr[employer]-dual-chamber   . Parathyroid related hypercalcemia   . Primary hyperparathyroidism   . Vitamin D deficiency disease   . Arthritis   . Clotting disorder   . Heart murmur   . Stroke      Current Outpatient Prescriptions  Medication Sig Dispense Refill  . atorvastatin (LIPITOR) 80 MG tablet Take 1 tablet (80 mg total) by mouth daily.  90 tablet  1  . BAYER MICROLET LANCETS lancets Use as instructed to check blood sugar once daily dx 250.00  100 each  3  . Calcium Carbonate (CALCIUM 500 PO) Take 1 tablet by mouth daily.       . dabigatran (PRADAXA) 150 MG CAPS Take 1 capsule (150 mg total) by mouth every 12 (twelve) hours.  180 capsule  1  . FLUoxetine (PROZAC) 20 MG capsule Take 1 capsule (20 mg total) by mouth daily.  90 capsule  1  . glucose blood (BAYER CONTOUR NEXT TEST) test  strip Use as instructed to check blood sugar once daily dx 250.00  100 each  3  . levothyroxine (SYNTHROID, LEVOTHROID) 100 MCG tablet Take 1 tablet (100 mcg total) by mouth daily. SYNTHROID ONLY, BRAND NAME MEDICALLY NECESSARY.  30 tablet  5  . lithium 300 MG capsule Take 300 mg by mouth 2 (two) times daily with a meal.        . losartan (COZAAR) 50 MG tablet Take 1 tablet (50 mg total) by mouth daily.  90 tablet  1  . metFORMIN (GLUCOPHAGE-XR) 500 MG 24 hr tablet Take 1 tablet (500 mg total) by mouth daily with breakfast.  90 tablet  1  . Multiple Vitamin (MULTIVITAMIN) tablet Take 1 tablet by mouth daily.        Marland Kitchen omeprazole (PRILOSEC) 40 MG capsule Take 1 capsule (40 mg total) by mouth daily.  90 capsule  1  . propranolol ER (INDERAL LA) 80 MG 24 hr capsule Take 1 capsule (80 mg total) by mouth daily.  30 capsule  5   No current facility-administered medications for this visit.     Allergies  Allergen Reactions  . Codeine     REACTION: vomiting  . Latex Other (See Comments)    irritation  . Metronidazole  Other (See Comments)    Reaction unknown  . Penicillins     REACTION: throat swelling, rash     Family History  Problem Relation Age of Onset  . CAD Mother   . Colon cancer      FH  . Diabetes Maternal Aunt   . CAD Father   . Diabetes type II Sister      History   Social History  . Marital Status: Single    Spouse Name: N/A    Number of Children: 2  . Years of Education: N/A   Occupational History  .     Social History Main Topics  . Smoking status: Current Every Day Smoker -- 1.00 packs/day for 48 years    Types: Cigarettes  . Smokeless tobacco: Never Used     Comment: switched to vapor cigs. Widowed- 2 girls. Lives with daughter (who is nurse at [employer])  . Alcohol Use: No     Comment: "once in a blue moon" when I have [place] food  . Drug Use: No  . Sexually Active: None   Other Topics Concern  . None   Social History Narrative   WIDOWED   2  DAUGHTERS   LIVES W/DAUGHTER   CURRENTLY SMOKES   NO ALCOHOL USE   NO ILLICIT DRUG USE   DAILY CAFFEINE USE      PPM-MEDTRONIC   PATIENT SIGNED A DESIGNATED PARTY RELEASE TO ALLOW DAUGHTER, NICOLE COX, TO HAVE ACCESS TO HER MEDICAL RECORDS/INFORMATION. Daphane Shepherd, November 09, 2009 9:19 AM     REVIEW OF SYSTEMS - PERTINENT POSITIVES ONLY: Discomfort when lying or sitting with pressure against the mass. Gradual increase in size.  EXAM: Filed Vitals:   01/20/13 0937  BP: 128/68  Pulse: 64  Temp: 98.4 F (36.9 C)  Resp: 15    HEENT: normocephalic; pupils equal and reactive; sclerae clear; dentition good; mucous membranes moist NECK:  Well-healed anterior surgical incision; symmetric on extension; no palpable anterior or posterior cervical lymphadenopathy; no supraclavicular masses; no tenderness CHEST: clear to auscultation bilaterally without rales, rhonchi, or wheezes CARDIAC: regular rate and rhythm without significant murmur; peripheral pulses are full BACK:  Soft tissue mass and left costovertebral angle overlying the rib cage measuring 4 cm x 3 cm x 3 cm in size, mobile, minimally tender EXT:  non-tender without edema; no deformity NEURO: no gross focal deficits; no sign of tremor   LABORATORY RESULTS: See [facility] (CHL-Epic) for most recent results   RADIOLOGY RESULTS: See [facility] (CHL-Epic) for most recent results   IMPRESSION: Soft tissue mass left flank, 4 x 3 x 3 cm, likely lipoma  PLAN: Patient and I discussed surgical excision. This can be done under sedation and local anesthesia as an outpatient surgical procedure. We will make arrangements for surgery in the near future. She will need to discontinue her anticoagulant for several days prior to the procedure. She understands and wishes to proceed.  The risks and benefits of the procedure have been discussed at length with the patient.  The patient understands the proposed procedure, potential  alternative treatments, and the course of recovery to be expected.  All of the patient's questions have been answered at this time.  The patient wishes to proceed with surgery.  Velora Heckler, MD, FACS General & Endocrine Surgery [facility]   Visit Diagnoses: 1. Soft tissue mass, left flank     Primary Care Physician: Rene Paci, MD

## 2013-01-27 NOTE — Preoperative (Signed)
Beta Blockers   Reason not to administer Beta Blockers:Not Applicable 

## 2013-01-27 NOTE — Progress Notes (Addendum)
Left message for Leta Jungling [phone])  about this patient and surgical time....Marland KitchenMarland Kitchen08:50 AM   DA

## 2013-01-27 NOTE — Anesthesia Postprocedure Evaluation (Signed)
  Anesthesia Post-op Note  Patient: Stacy [name]  Procedure(s) Performed: Procedure(s): EXCISION MASS LEFT FLANK (Left)  Patient Location: PACU  Anesthesia Type:General  Level of Consciousness: awake, alert  and oriented  Airway and Oxygen Therapy: Patient Spontanous Breathing and Patient connected to nasal cannula oxygen  Post-op Pain: mild  Post-op Assessment: Post-op Vital signs reviewed, Patient's Cardiovascular Status Stable, Respiratory Function Stable and Pain level controlled  Post-op Vital Signs: stable  Complications: No apparent anesthesia complications

## 2013-01-27 NOTE — Interval H&P Note (Signed)
History and Physical Interval Note:  01/27/2013 9:43 AM  Stacy [name]  has presented today for surgery, with the diagnosis of enlarged mass left side.  The various methods of treatment have been discussed with the patient and family. After consideration of risks, benefits and other options for treatment, the patient has consented to    Procedure(s): EXCISION MASS LEFT FLANK (Left) as a surgical intervention .    The patient's history has been reviewed, patient examined, no change in status, stable for surgery.  I have reviewed the patient's chart and labs.  Questions were answered to the patient's satisfaction.    The risks and benefits of the procedure have been discussed at length with the patient.  The patient understands the proposed procedure, potential alternative treatments, and the course of recovery to be expected.  All of the patient's questions have been answered at this time.  The patient wishes to proceed with surgery.  Velora Heckler, MD, [facility] Office: [phone]    Stacy [name] Judie Petit

## 2013-01-27 NOTE — Anesthesia Preprocedure Evaluation (Signed)
Anesthesia Evaluation  Patient identified by MRN, date of birth, ID band Patient awake    Reviewed: Allergy & Precautions, H&P , NPO status , Patient's Chart, lab work & pertinent test results  Airway Mallampati: II      Dental  (+) Teeth Intact and Dental Advisory Given   Pulmonary  breath sounds clear to auscultation        Cardiovascular Rhythm:Irregular Rate:Normal     Neuro/Psych    GI/Hepatic   Endo/Other    Renal/GU      Musculoskeletal   Abdominal   Peds  Hematology   Anesthesia Other Findings   Reproductive/Obstetrics                           Anesthesia Physical Anesthesia Plan  ASA: III  Anesthesia Plan: MAC   Post-op Pain Management:    Induction: Intravenous  Airway Management Planned: Mask and Natural Airway  Additional Equipment:   Intra-op Plan:   Post-operative Plan:   Informed Consent:   Plan Discussed with: CRNA, Anesthesiologist and Surgeon  Anesthesia Plan Comments:         Anesthesia Quick Evaluation

## 2013-01-27 NOTE — Brief Op Note (Signed)
01/27/2013  10:51 AM  PATIENT:  Stacy [name]  72 y.o. female  PRE-OPERATIVE DIAGNOSIS:  Left flank mass (3x4x2 cm)  POST-OPERATIVE DIAGNOSIS:  same  PROCEDURE:  Procedure(s): EXCISION MASS LEFT FLANK (Left)  SURGEON:  Surgeon(s) and Role:    * Velora Heckler, MD - Primary  ANESTHESIA:   IV sedation  EBL:     BLOOD ADMINISTERED:none  DRAINS: none   LOCAL MEDICATIONS USED:  MARCAINE     SPECIMEN:  No Specimen and Excision  DISPOSITION OF SPECIMEN:  PATHOLOGY  COUNTS:  YES  TOURNIQUET:  * No tourniquets in log *  DICTATION: .Other Dictation: Dictation Number [id number]  PLAN OF CARE: Discharge to home after PACU  PATIENT DISPOSITION:  PACU - hemodynamically stable.   Delay start of Pharmacological VTE agent (>24hrs) due to surgical blood loss or risk of bleeding: yes  Velora Heckler, MD, [facility] Office: [phone]

## 2013-01-27 NOTE — Op Note (Signed)
NAME:  JULL, HARRAL NO.:  [id number]  MEDICAL RECORD NO.:  [id number]  LOCATION:  [facility]                         FACILITY:  [facility]  PHYSICIAN:  Velora Heckler, MD      DATE OF BIRTH:  02-14-1941  DATE OF PROCEDURE:  01/27/2013                               OPERATIVE REPORT   PREOPERATIVE DIAGNOSIS:  Soft tissue mass, left flank (3 x 4 x 2 cm).  POSTOPERATIVE DIAGNOSIS:  Soft tissue mass, left flank (3 x 4 x 2 cm).  PROCEDURE:  Excision soft tissue mass, left flank (3 x 4 x 2 cm), subcutaneous.  SURGEON:  Velora Heckler, MD  ANESTHESIA:  Local with intravenous sedation per Dr. Kipp Brood.  ESTIMATED BLOOD LOSS:  Minimal.  PREPARATION:  ChloraPrep.  COMPLICATIONS:  None.  INDICATIONS:  The patient is a 72 year old female well-known to my practice.  She has a soft tissue mass in the left flank, which is gradually increased in size.  It causes discomfort.  It is limiting her physical activity.  She now comes to surgery for excision.  BODY OF REPORT:  Procedure was done in OR #9 at the [facility].  The patient was brought to the operating room, placed in a supine position on the operating room table.  Following administration of intravenous sedation, the patient was turned to a right lateral decubitus position.  Skin was prepped with ChloraPrep.  After ascertaining that an adequate level of anesthesia been achieved, the patient was draped in the usual aseptic fashion.  The skin was anesthetized with local anesthetic.  A 4- cm incision was made with a #15 blade.  Dissection was carried in the subcutaneous tissues.  Using the electrocautery for hemostasis, the mass was carefully dissected out the subcutaneous space down to the underlying musculature.  The overlying fascia was excised with the mass. The entire mass was submitted to Pathology for review.  Hemostasis was achieved with the electrocautery.  Subcutaneous tissues were  closed with interrupted 3-0 Vicryl sutures.  Skin was closed with a running 4-0 Monocryl subcuticular suture.  Wound was washed and dried and Benzoin and Steri-Strips were applied.  Sterile dressings were applied.  The patient was awakened from anesthesia and brought to the recovery room.  The patient tolerated the procedure well.   Velora Heckler, MD, [facility] Office: [phone]   TMG/MEDQ  D:  01/27/2013  T:  01/27/2013  Job:  [id number]  cc:   Vikki Ports A. Felicity Coyer, MD

## 2013-01-28 ENCOUNTER — Telehealth (INDEPENDENT_AMBULATORY_CARE_PROVIDER_SITE_OTHER): Payer: Self-pay

## 2013-01-28 NOTE — Telephone Encounter (Signed)
LMOM for pt to call back. Per Dr Gerrit Friends need to advise pt Path benign. Keep appt 7-9.

## 2013-01-28 NOTE — Progress Notes (Signed)
Quick Note:  Please contact patient and notify of benign pathology results.  Stacy [name] M. Stacy Ostrosky, MD, FACS [facility] Office: [phone]   ______ 

## 2013-01-28 NOTE — Telephone Encounter (Signed)
Patient called back and I made her aware pathology is benign. She will keep appt on 02/12/2013 and call with any problems prior.

## 2013-01-29 ENCOUNTER — Encounter (HOSPITAL_COMMUNITY): Payer: Self-pay | Admitting: Surgery

## 2013-01-30 ENCOUNTER — Ambulatory Visit (INDEPENDENT_AMBULATORY_CARE_PROVIDER_SITE_OTHER): Payer: Medicare Other | Admitting: Internal Medicine

## 2013-01-30 ENCOUNTER — Ambulatory Visit (INDEPENDENT_AMBULATORY_CARE_PROVIDER_SITE_OTHER)
Admission: RE | Admit: 2013-01-30 | Discharge: 2013-01-30 | Disposition: A | Discharge status: Home / Self Care | Payer: Medicare Other | Source: Ambulatory Visit | Attending: Internal Medicine | Admitting: Internal Medicine

## 2013-01-30 ENCOUNTER — Other Ambulatory Visit (INDEPENDENT_AMBULATORY_CARE_PROVIDER_SITE_OTHER): Payer: Medicare Other

## 2013-01-30 ENCOUNTER — Encounter: Payer: Self-pay | Admitting: Internal Medicine

## 2013-01-30 VITALS — BP 132/82 | HR 80 | Temp 97.1°F | Ht 66.0 in | Wt 123.4 lb

## 2013-01-30 DIAGNOSIS — J309 Allergic rhinitis, unspecified: Secondary | ICD-10-CM

## 2013-01-30 DIAGNOSIS — Z13820 Encounter for screening for osteoporosis: Secondary | ICD-10-CM

## 2013-01-30 DIAGNOSIS — N318 Other neuromuscular dysfunction of bladder: Secondary | ICD-10-CM

## 2013-01-30 DIAGNOSIS — N3281 Overactive bladder: Secondary | ICD-10-CM

## 2013-01-30 DIAGNOSIS — E785 Hyperlipidemia, unspecified: Secondary | ICD-10-CM

## 2013-01-30 DIAGNOSIS — H9190 Unspecified hearing loss, unspecified ear: Secondary | ICD-10-CM

## 2013-01-30 DIAGNOSIS — H9193 Unspecified hearing loss, bilateral: Secondary | ICD-10-CM

## 2013-01-30 LAB — LIPID PANEL
Cholesterol: 93 mg/dL (ref 0–200)
LDL Cholesterol: 24 mg/dL (ref 0–99)
Triglycerides: 61 mg/dL (ref 0.0–149.0)
VLDL: 12.2 mg/dL (ref 0.0–40.0)

## 2013-01-30 MED ORDER — SOLIFENACIN SUCCINATE 5 MG PO TABS: 5.0000 mg | ORAL_TABLET | Freq: Every day | ORAL | Status: DC

## 2013-01-30 MED ORDER — LORATADINE 10 MG PO TABS: 10.0000 mg | ORAL_TABLET | Freq: Every day | ORAL | Status: DC

## 2013-01-30 NOTE — Assessment & Plan Note (Signed)
On atorva for same - check lipids annually, adjust as needed

## 2013-01-30 NOTE — Patient Instructions (Signed)
It was good to see you today. We have reviewed your prior records including labs and tests today Test(s) ordered today. Your results will be released to MyChart (or called to you) after review, usually within 72hours after test completion. If any changes need to be made, you will be notified at that same time. Medications reviewed and updated - start Vesicare for your overactive bladder and Claritin daily for runny nose - Your prescription(s) have been submitted to your pharmacy. Please take as directed and contact our office if you believe you are having problem(s) with the medication(s). Will refer to audiology for hearing test as requested Continue working with your other specialists for thyroid and diabetes as reviewed Followup in 6 months, please call sooner if problems

## 2013-01-30 NOTE — Assessment & Plan Note (Signed)
Urgency, not stress Start vesicare (or other formulary med)

## 2013-01-30 NOTE — Progress Notes (Signed)
Subjective:    Patient ID: Stacy [name], female    DOB: 1941-07-13, 72 y.o.   MRN: [id number]  HPI  here for followup - reviewed chronic medical issues today   Hypothyroidism, hx Hashimoto's - follows with endo every 6 mo for same - no dose changes in thyroid replacment - no skin or weight changes   bipolar dz with depression - Li levels monitored periodically - no dose adjustments or changes -symptoms well controlled - follows with psyc Nature conservation officer) several times each year and as needed - associated with long standing shaking of hands and arms, most notable in AM but not every AM - s/p neuro eval for same 2011, spring 2013 and spring 2014: each time tremor felt related to Lithium -    dyslipidemia - on statin -reports compliance with ongoing medical treatment and no changes in medication dose or frequency. denies adverse side effects related to current therapy. no myalgia   Atrial fibrillation - on pradaxa for anticoag = follows with cards for same   CAD issues - reviewed hx - reports compliance with ongoing medical treatment and no changes in medication dose or frequency. denies adverse side effects related to current therapy.    diabetes mellitus 2 -dx by incidental labs 06/2010 at [facility] -on metformin for same since then and follows with endo- the patient reports compliance with medication(s) as prescribed. Denies adverse side effects.  Past Medical History  Diagnosis Date  . Chronic bipolar disorder   . Coronary artery disease     s/p PTCA  . Complete AV block     s/p PPM--[employer] ADAPTA ADDr01  . Takotsubo syndrome   . GERD (gastroesophageal reflux disease)   . COPD (chronic obstructive pulmonary disease)     TOBACCO ABUSE  . Abscess of liver(572.0)   . Atrial fibrillation     chronic anticoag - pradaxa  . BIPOLAR AFFECTIVE DISORDER   . DEPRESSION   . DIABETES MELLITUS, TYPE II dx 04/2010  . DYSLIPIDEMIA   . HYPERTENSION   . HYPOTHYROIDISM     hashimoto's  . TOBACCO  ABUSE   . Pacemaker-[employer]-dual-chamber   . Parathyroid related hypercalcemia   . Primary hyperparathyroidism   . Vitamin D deficiency disease   . Cancer     basal cell on abdomen     Review of Systems  Constitutional: Negative for fever and unexpected weight change.  HENT: Positive for hearing loss.   Genitourinary: Positive for urgency.  Neurological: Positive for tremors (chronic, related to [facility]). Negative for syncope and weakness.       Objective:   Physical Exam  BP 132/82  Pulse 80  Temp(Src) 97.1 F (36.2 C) (Oral)  Ht 5\' 6"  (1.676 m)  Wt 123 lb 6.4 oz (55.974 kg)  BMI 19.93 kg/m2  SpO2 98% Wt Readings from Last 3 Encounters:  01/30/13 123 lb 6.4 oz (55.974 kg)  01/24/13 121 lb 5 oz (55.027 kg)  01/20/13 121 lb 9.6 oz (55.157 kg)   Constitutional: She appears well-developed and well-nourished. No distress.  HENT: NCAT, TMs clear without erythema or effusion - no cerumen Neck: Normal range of motion. Neck supple. No JVD present. No thyromegaly present.  Cardiovascular: Normal rate, regular rhythm and normal heart sounds.  No murmur heard. No BLE edema. Pulmonary/Chest: Effort normal and breath sounds normal. No respiratory distress. She has no wheezes.  Neurological: She is alert and oriented to person, place, and time. No cranial nerve deficit. Coordination normal. tremor BUE, intention>rest Psychiatric: She  has a normal mood and affect. Her behavior is normal. Judgment and thought content normal.   Lab Results  Component Value Date   WBC 11.4* 01/24/2013   HGB 14.6 01/24/2013   HCT 43.3 01/24/2013   PLT 202 01/24/2013   GLUCOSE 103* 01/24/2013   CHOL 128 09/07/2010   TRIG 152.0* 09/07/2010   HDL 50.20 09/07/2010   LDLDIRECT 77.5 08/11/2009   LDLCALC 47 09/07/2010   ALT 20 01/16/2013   AST 17 01/16/2013   NA 139 01/24/2013   K 4.1 01/24/2013   CL 102 01/24/2013   CREATININE 0.78 01/24/2013   BUN 10 01/24/2013   CO2 30 01/24/2013   TSH 2.550 10/30/2012   INR 2.0  10/21/2009   HGBA1C 5.9 11/06/2012       Assessment & Plan:  See problem list. Medications and labs reviewed today.  Decreased hearing, chronic and bilateral per family report - refer to audiology   allergic rhinitis - add claritin qd

## 2013-02-12 ENCOUNTER — Ambulatory Visit (INDEPENDENT_AMBULATORY_CARE_PROVIDER_SITE_OTHER): Payer: Medicare Other | Admitting: Surgery

## 2013-02-12 ENCOUNTER — Encounter (INDEPENDENT_AMBULATORY_CARE_PROVIDER_SITE_OTHER): Payer: Self-pay | Admitting: Surgery

## 2013-02-12 VITALS — BP 128/68 | HR 68 | Temp 99.2°F | Resp 15 | Ht 66.0 in | Wt 121.4 lb

## 2013-02-12 DIAGNOSIS — M799 Soft tissue disorder, unspecified: Secondary | ICD-10-CM

## 2013-02-12 DIAGNOSIS — M7989 Other specified soft tissue disorders: Secondary | ICD-10-CM

## 2013-02-12 NOTE — Progress Notes (Signed)
General Surgery [facility]  Visit Diagnoses: 1. Soft tissue mass, left flank     HISTORY: Patient returns for postoperative visit having undergone excision of a benign lipoma from the left flank on 01/27/2013.  EXAM: Surgical incision is well-healed. Small seroma in the subcutaneous tissue. No sign of infection.  IMPRESSION: Status post excision benign lipoma left flank  PLAN: The patient will begin applying topical creams to her incision. She will return for surgical care as needed.  Velora Heckler, MD, FACS General & Endocrine Surgery [facility]

## 2013-02-12 NOTE — Patient Instructions (Signed)
  COCOA BUTTER & VITAMIN E CREAM  (Palmer's or other brand)  Apply cocoa butter/vitamin E cream to your incision 2 - 3 times daily.  Massage cream into incision for one minute with each application.  Use sunscreen (50 SPF or higher) for first 6 months after surgery if area is exposed to sun.  You may substitute Mederma or other scar reducing creams as desired.   

## 2013-02-13 ENCOUNTER — Other Ambulatory Visit: Payer: Self-pay

## 2013-02-24 ENCOUNTER — Encounter: Payer: Medicare Other | Admitting: *Deleted

## 2013-02-28 ENCOUNTER — Telehealth: Payer: Self-pay | Admitting: *Deleted

## 2013-02-28 NOTE — Telephone Encounter (Signed)
Pt called requesting refill on Lancets and test strips; advised 3 refills of each should be at [facility].  Original Rx written in June, 2014.  Pt states she would go to the pharmacy.

## 2013-03-04 ENCOUNTER — Encounter: Payer: Self-pay | Admitting: *Deleted

## 2013-03-06 ENCOUNTER — Ambulatory Visit (INDEPENDENT_AMBULATORY_CARE_PROVIDER_SITE_OTHER): Payer: Medicare Other | Admitting: *Deleted

## 2013-03-06 DIAGNOSIS — I442 Atrioventricular block, complete: Secondary | ICD-10-CM

## 2013-03-06 DIAGNOSIS — Z950 Presence of cardiac pacemaker: Secondary | ICD-10-CM

## 2013-03-12 LAB — REMOTE PACEMAKER DEVICE
AL IMPEDENCE PM: 521 Ohm
ATRIAL PACING PM: 99
RV LEAD AMPLITUDE: 22.4 mv
RV LEAD IMPEDENCE PM: 1152 Ohm
VENTRICULAR PACING PM: 25

## 2013-03-24 ENCOUNTER — Encounter: Payer: Self-pay | Admitting: Internal Medicine

## 2013-04-17 ENCOUNTER — Other Ambulatory Visit: Payer: Self-pay | Admitting: *Deleted

## 2013-04-17 DIAGNOSIS — E038 Other specified hypothyroidism: Secondary | ICD-10-CM

## 2013-04-23 ENCOUNTER — Ambulatory Visit: Payer: Medicare Other | Admitting: Neurology

## 2013-05-05 LAB — TSH: TSH: 3.575 u[IU]/mL (ref 0.350–4.500)

## 2013-05-05 LAB — T4, FREE: Free T4: 1.46 ng/dL (ref 0.80–1.80)

## 2013-05-06 ENCOUNTER — Encounter (INDEPENDENT_AMBULATORY_CARE_PROVIDER_SITE_OTHER): Payer: Self-pay

## 2013-05-08 ENCOUNTER — Ambulatory Visit (INDEPENDENT_AMBULATORY_CARE_PROVIDER_SITE_OTHER): Payer: Medicare HMO | Admitting: "Endocrinology

## 2013-05-08 ENCOUNTER — Encounter: Payer: Self-pay | Admitting: "Endocrinology

## 2013-05-08 VITALS — BP 122/72 | HR 68 | Wt 124.0 lb

## 2013-05-08 DIAGNOSIS — IMO0001 Reserved for inherently not codable concepts without codable children: Secondary | ICD-10-CM

## 2013-05-08 DIAGNOSIS — E210 Primary hyperparathyroidism: Secondary | ICD-10-CM

## 2013-05-08 DIAGNOSIS — E038 Other specified hypothyroidism: Secondary | ICD-10-CM

## 2013-05-08 DIAGNOSIS — F17200 Nicotine dependence, unspecified, uncomplicated: Secondary | ICD-10-CM

## 2013-05-08 DIAGNOSIS — R5381 Other malaise: Secondary | ICD-10-CM

## 2013-05-08 DIAGNOSIS — E063 Autoimmune thyroiditis: Secondary | ICD-10-CM

## 2013-05-08 DIAGNOSIS — G250 Essential tremor: Secondary | ICD-10-CM

## 2013-05-08 DIAGNOSIS — R7309 Other abnormal glucose: Secondary | ICD-10-CM

## 2013-05-08 DIAGNOSIS — Z720 Tobacco use: Secondary | ICD-10-CM

## 2013-05-08 DIAGNOSIS — I10 Essential (primary) hypertension: Secondary | ICD-10-CM

## 2013-05-08 DIAGNOSIS — E049 Nontoxic goiter, unspecified: Secondary | ICD-10-CM

## 2013-05-08 DIAGNOSIS — R7303 Prediabetes: Secondary | ICD-10-CM

## 2013-05-08 LAB — GLUCOSE, POCT (MANUAL RESULT ENTRY): POC Glucose: 153 mg/dl — AB (ref 70–99)

## 2013-05-08 LAB — POCT GLYCOSYLATED HEMOGLOBIN (HGB A1C): Hemoglobin A1C: 5.4

## 2013-05-08 NOTE — Progress Notes (Signed)
Subjective:  Patient Name: Stacy [name] Date of Birth: 04/10/1941  MRN: [id number]  Stacy [name]  presents to the office today for follow-up of her T2DM, hypothyroidism, thyroiditis, hypertension, fatigue, secondary hyperparathyroidism, vitamin D deficiency, hyperlipidemia, tobacco abuse, and tremor.  HISTORY OF PRESENT ILLNESS:   Stacy [name] is a 73 y.o. Caucasian woman.  Stacy [name] was unaccompanied.  1. Patient was first referred to me on 09/04/05 for evaluation and management of chronic hypothyroidism and new hypercalcemia and hyperparathyroidism. In brief, she developed vitamin D deficiency, then secondary hyperparathyroidism, and then tertiary hyperparathyroidism. Her right inferior parathyroid gland was quite enlarged. She was taken to surgery on 12/07/05 for parathyroidectomy. Since that time she's done well, provided that she has continued to take her calcium and vitamin D.  She has had some waxing and waning of her thyroid gland size during these years, which is consistent with inflammatory flare-ups of her Hashimoto's disease. We've increased her thyroid hormone dose slightly over time. She now takes 100 mcg of Synthroid daily. She has remained euthyroid most of the time.   2. The patient has experienced many other problems during the last 7 years, to include bipolar disorder, GERD, fatigue, iron deficiency, 2 heart attacks, hypertension, orthostatic hypotension, atrial fibrillation, and the need for a cardiac pacemaker. We've helped to manage some of those problems and kept her as supplied as we could with samples of antihypertensive medications and Synthroid. She was diagnosed with type 2 diabetes in early 2012 and was started on metformin 500 mg once daily. She was also started on atorvastatin for hyperlipidemia.  3. The patient's last [facility] visit was on 11/06/12. In the interim, she has been healthy. Her memory is OK if she pays attention. She is taking Synthroid, 100 mcg/day and metformin, 500  mg, once daily. She now takes 80 mg of propranolol per day to attempt to control her tremor, but she does not think that it is working. She also takes atorvastatin, calcium carbonate, fluoxetine, lithium, losartan, and solifenacin. She has significantly reduced her intake of sweets and other carbs, to include honey. She had an episode of vertigo earlier this week.   4. Pertinent Review of Systems:  Constitutional: The patient says, "I feel pretty good.". Her energy level is variable. She stopped dating her former companion again. Her attitude has been a lot better.  Eyes: Vision is good as long as she wears her glasses. She had her last eye exam about one year ago. There are no other significant eye complaints. Neck: The patient has no complaints of anterior neck swelling, soreness, tenderness,  pressure, discomfort, or difficulty swallowing.  Heart: Her pacemaker is still working. She has no complaints of palpitations, irregular heat beats, chest pain, or chest pressure. Gastrointestinal: She has occasional  constipation if she does not drink enough water and have enough fiber intake. The patient has no complaints of excessive hunger, acid reflux, upset stomach, stomach aches or pains. Arms: She is no longer having pain in her shoulders (trapezius areas) when sitting, standing, or lying on her sides.  Legs: Muscle mass and strength seem normal. Her knees and hips are doing fine. There are no complaints of numbness, tingling, or burning. No edema is noted. Feet: There are no obvious foot problems. There are no complaints of numbness, tingling, burning, or pain. No edema is noted. Hypoglycemia: None  5. BG printout: We were unable to print out her old meter today. Her 14  day average BG is 106. Most BGs are in the  84-118 range. She had a lot of pasta at supper on 05/01/13, producing a BG of 204 a few hours later.  PAST MEDICAL, FAMILY, AND SOCIAL HISTORY:  Past Medical History  Diagnosis Date  .  Chronic bipolar disorder   . Coronary artery disease     s/p PTCA  . Complete AV block     s/p PPM--MEDTRONIC ADAPTA ADDr01  . Takotsubo syndrome   . GERD (gastroesophageal reflux disease)   . COPD (chronic obstructive pulmonary disease)     TOBACCO ABUSE  . Abscess of liver(572.0)   . Atrial fibrillation     chronic anticoag - pradaxa  . BIPOLAR AFFECTIVE DISORDER   . DEPRESSION   . DIABETES MELLITUS, TYPE II dx 04/2010  . DYSLIPIDEMIA   . HYPERTENSION   . HYPOTHYROIDISM     hashimoto's  . TOBACCO ABUSE   . Pacemaker-Medtronic-dual-chamber   . Parathyroid related hypercalcemia   . Primary hyperparathyroidism   . Vitamin D deficiency disease   . Cancer     basal cell on abdomen    Family History  Problem Relation Age of Onset  . CAD Mother   . Colon cancer      FH  . Diabetes Maternal Aunt   . CAD Father   . Diabetes type II Sister     Current outpatient prescriptions:atorvastatin (LIPITOR) 80 MG tablet, Take 80 mg by mouth daily before breakfast., Disp: , Rfl: ;  BAYER MICROLET LANCETS lancets, Use as instructed to check blood sugar once daily dx 250.00, Disp: 100 each, Rfl: 3;  Calcium Carbonate (CALCIUM 500 PO), Take 1 tablet by mouth at bedtime. , Disp: , Rfl:  dabigatran (PRADAXA) 150 MG CAPS, Take 1 capsule (150 mg total) by mouth every 12 (twelve) hours., Disp: 180 capsule, Rfl: 1;  FLUoxetine (PROZAC) 20 MG capsule, Take 20 mg by mouth daily before breakfast., Disp: , Rfl: ;  glucose blood (BAYER CONTOUR NEXT TEST) test strip, Use as instructed to check blood sugar once daily dx 250.00, Disp: 100 each, Rfl: 3 levothyroxine (SYNTHROID, LEVOTHROID) 100 MCG tablet, Take 1 tablet (100 mcg total) by mouth daily. SYNTHROID ONLY, BRAND NAME MEDICALLY NECESSARY., Disp: 30 tablet, Rfl: 5;  lithium 300 MG capsule, Take 300 mg by mouth 2 (two) times daily with a meal.  , Disp: , Rfl: ;  loratadine (CLARITIN) 10 MG tablet, Take 1 tablet (10 mg total) by mouth daily., Disp: 30  tablet, Rfl: 11 losartan (COZAAR) 50 MG tablet, Take 50 mg by mouth daily before breakfast., Disp: , Rfl: ;  metFORMIN (GLUCOPHAGE-XR) 500 MG 24 hr tablet, Take 1 tablet (500 mg total) by mouth daily with breakfast., Disp: 90 tablet, Rfl: 1;  Multiple Vitamin (MULTIVITAMIN) tablet, Take 1 tablet by mouth daily.  , Disp: , Rfl: ;  omeprazole (PRILOSEC) 40 MG capsule, Take 40 mg by mouth daily before breakfast., Disp: , Rfl:  propranolol ER (INDERAL LA) 80 MG 24 hr capsule, Take 80 mg by mouth daily before breakfast., Disp: , Rfl: ;  solifenacin (VESICARE) 5 MG tablet, Take 1 tablet (5 mg total) by mouth daily., Disp: 30 tablet, Rfl: 5  Allergies as of 05/08/2013 - Review Complete 05/08/2013  Allergen Reaction Noted  . Codeine    . Metronidazole Other (See Comments) 03/26/2008  . Other  01/24/2013  . Penicillins      1. Work and Family: She is working part-time at a [employer]. Her daughter, Stacy [name], lives with her. 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  Stacy [name] had had more variability in mood recently.  2. Activities: Stacy [name] has not been walking much.     3. Smoking, alcohol, or drugs: She still smokes. She says that she will quit when she drops dead.  4. Primary Care Provider: Rene Paci, MD  REVIEW OF SYSTEMS: There are no other significant problems involving Stacy [name]'s other body systems.   Objective:  Vital Signs:  BP 122/72  Pulse 68  Wt 124 lb (56.246 kg)  BMI 20.02 kg/m2   Ht Readings from Last 3 Encounters:  02/12/13 5\' 6"  (1.676 m)  01/30/13 5\' 6"  (1.676 m)  01/24/13 5\' 6"  (1.676 m)   Wt Readings from Last 3 Encounters:  05/08/13 124 lb (56.246 kg)  02/12/13 121 lb 6.4 oz (55.067 kg)  01/30/13 123 lb 6.4 oz (55.974 kg)   PHYSICAL EXAM:  Constitutional: The patient appears healthy and well nourished.  Her weight has increased about 5 oz. in 5 months. Her mood and affect are wonderfully normal.   Head: Her head tremor is still present, but is reduced since starting  propranolol.  Face: The face appears normal.  Eyes: There is no obvious arcus or proptosis. Moisture appears normal. Mouth: The oropharynx and tongue appear normal. Oral moisture is normal. Neck: The neck appears to be visibly normal. No carotid bruits are noted. She has a low-lying thyroid gland. The thyroid gland is probably within normal size at 18-20 grams. Both lobes today are within normal limits for size. The consistency of the thyroid gland is normal. The thyroid gland is not tender to palpation. Lungs: The lungs are clear to auscultation. Air movement is good. Heart: Heart rate and rhythm are regular. Heart sounds S1 and S2 are normal. I did not appreciate any pathologic cardiac murmurs. Abdomen: The abdomen is mildly enlarged. Bowel sounds are normal. There is no obvious hepatomegaly, splenomegaly, or other mass effect.  Arms: Muscle size and bulk are normal for age. She has no cog-wheeling of her arms when I passively flex and extend her arms at the elbows. Hands: She has a 2+ hand tremor. Phalangeal and metacarpophalangeal joints are normal. Palmar muscles are normal. Palmar skin is normal. Palmar moisture is also normal. Fingernails are somewhat pale. Legs: Muscles appear normal for age. No edema is present. Feet: She has a 2+ dorsalis pedis pulses bilaterally.  Neurologic: Strength is normal for age in both the upper and lower extremities. Muscle tone is normal. Sensation to touch is normal in both legs and feet. She walked normally, without any of the hesitation and rigidity usually seen with Parkinson's disease.   LAB DATA: Hemoglobin A1c today was 5.4% today, compared with 5.9% at last visit and 5.9% at the visit prior.   05/05/13: TSH 3.575, free T4 1.46, free T3 2.5 01/30/13: Cholesterol 93, triglycerides 61, HDL 56, LDL 24 10/30/12: TSH 2.550, free T4 1.24, free T3 2.5; calcium 10.4, PTH 36.3, 24-hydroxy vitamin D 42; lithium 0.80 (0.80-1.40)  04/18/12: TSH was 2.106, Free T4 was  1.41. Free T3 was 2.8.                  10/25/11: PTH 30.8, calcium 10.4, 25-hydroxy vitamin D 43; WBC 7.8, hemoglobin 14.9, hematocrit 46.9%, iron 71             Assessment and Plan:   ASSESSMENT:  1&2. Hypothyroid/ Hashimoto's thyroiditis: Her TFTS, at first glance, do not make sense. This pattern is c/w a recent flare up of Hashimoto's Dz. We can't make  any decisions about whether or not to change her Synthroid dose until the TFTs stabilize. Her thyroiditis is clinically quiescent  3. Hypertension: Her blood pressure is very good. If she resumes exercising, however, she may then need to reduce her losartan doses.   4. T2DM: Her HbA1c is within normal today. Occasionally, however, she can take in too many carbs resulting in higher BGs. She needs to watch her carbohydrate intake. We do not need to increase her metformin to twice daily.   5.  Fatigue: She is really feeling very well. 6.  Hyperparathyroidism and vitamin D deficiency: her labs in March of this year were normal. She is very compliant with taking her Caltrate-D and MVI each evening.   7. Tobacco abuse: She needs to stop smoking, but is not motivated to do so at this time. 8. Tremor: Her tremor was not felt to be due to Parkinson's Dz. She is doing better with propranolol.  9. Pallor: her pallor has improved over time. Her iron level, hemoglobin, and hematocrit were fine.  10. Goiter: Her thyroid gland is again within normal size today.   PLAN:  1. Diagnostic:  TFTs in 3 months ( I gave her the slip for these tests.).  TFTs, calcium, PTH, lithium, and 25-hydroxy vitamin D prior to next visit.  2. Therapeutic:  Eat Right, exercise right (walk 30-60 minutes per day), and take your Synthroid metformin. Stop smoking. 3. Patient education: We talked a lot about her tremor, her smoking, and her thyroiditis and hypothyroidism. I really leaned on her again to quit smoking "cold [place]".   4. Follow-up: 6 months with me. Please schedule a FU  visit with Dr. Felicity Coyer as she desires.  Level of Service: This visit lasted in excess of 50 minutes. More than 50% of the visit was devoted to counseling.   David Stall, MD 05/08/2013 10:02 AM

## 2013-05-08 NOTE — Patient Instructions (Signed)
Follow up visit in 6 months. Watch your carb intake. STOP SMOKING!!!!!!

## 2013-05-10 DIAGNOSIS — E063 Autoimmune thyroiditis: Secondary | ICD-10-CM | POA: Insufficient documentation

## 2013-05-14 ENCOUNTER — Other Ambulatory Visit: Payer: Self-pay | Admitting: Internal Medicine

## 2013-05-14 ENCOUNTER — Other Ambulatory Visit: Payer: Self-pay | Admitting: *Deleted

## 2013-05-14 MED ORDER — LOSARTAN POTASSIUM 50 MG PO TABS: 50.0000 mg | ORAL_TABLET | Freq: Every day | ORAL | Status: DC

## 2013-05-31 ENCOUNTER — Ambulatory Visit (INDEPENDENT_AMBULATORY_CARE_PROVIDER_SITE_OTHER): Payer: Medicare HMO | Admitting: Family Medicine

## 2013-05-31 VITALS — BP 100/60 | HR 74 | Temp 97.4°F | Resp 16 | Ht 65.0 in | Wt 123.0 lb

## 2013-05-31 DIAGNOSIS — H109 Unspecified conjunctivitis: Secondary | ICD-10-CM

## 2013-05-31 DIAGNOSIS — R21 Rash and other nonspecific skin eruption: Secondary | ICD-10-CM

## 2013-05-31 DIAGNOSIS — M1710 Unilateral primary osteoarthritis, unspecified knee: Secondary | ICD-10-CM

## 2013-05-31 DIAGNOSIS — M1712 Unilateral primary osteoarthritis, left knee: Secondary | ICD-10-CM

## 2013-05-31 DIAGNOSIS — M76899 Other specified enthesopathies of unspecified lower limb, excluding foot: Secondary | ICD-10-CM

## 2013-05-31 DIAGNOSIS — M7062 Trochanteric bursitis, left hip: Secondary | ICD-10-CM

## 2013-05-31 DIAGNOSIS — IMO0002 Reserved for concepts with insufficient information to code with codable children: Secondary | ICD-10-CM

## 2013-05-31 MED ORDER — DESOXIMETASONE 0.05 % EX CREA: TOPICAL_CREAM | Freq: Two times a day (BID) | CUTANEOUS | Status: DC

## 2013-05-31 MED ORDER — BACITRACIN-POLYMYXIN B 500-10000 UNIT/GM OP OINT: TOPICAL_OINTMENT | OPHTHALMIC | Status: DC

## 2013-05-31 NOTE — Patient Instructions (Signed)

## 2013-06-09 ENCOUNTER — Ambulatory Visit (INDEPENDENT_AMBULATORY_CARE_PROVIDER_SITE_OTHER): Payer: Medicare HMO | Admitting: *Deleted

## 2013-06-09 DIAGNOSIS — Z950 Presence of cardiac pacemaker: Secondary | ICD-10-CM

## 2013-06-09 DIAGNOSIS — I442 Atrioventricular block, complete: Secondary | ICD-10-CM

## 2013-06-10 LAB — REMOTE PACEMAKER DEVICE
AL IMPEDENCE PM: 600 Ohm
BAMS-0001: 175 {beats}/min
RV LEAD AMPLITUDE: 22.4 mv
RV LEAD IMPEDENCE PM: 1322 Ohm
RV LEAD THRESHOLD: 1.125 V
VENTRICULAR PACING PM: 19

## 2013-06-19 ENCOUNTER — Encounter: Payer: Self-pay | Admitting: Neurology

## 2013-06-19 ENCOUNTER — Ambulatory Visit (INDEPENDENT_AMBULATORY_CARE_PROVIDER_SITE_OTHER): Payer: Medicare HMO | Admitting: Neurology

## 2013-06-19 VITALS — BP 118/60 | HR 60 | Temp 98.6°F | Resp 12 | Ht 66.0 in | Wt 124.5 lb

## 2013-06-19 DIAGNOSIS — R259 Unspecified abnormal involuntary movements: Secondary | ICD-10-CM

## 2013-06-19 DIAGNOSIS — R413 Other amnesia: Secondary | ICD-10-CM

## 2013-06-19 MED ORDER — DONEPEZIL HCL 10 MG PO TABS: 10.0000 mg | ORAL_TABLET | Freq: Every day | ORAL | Status: DC

## 2013-06-19 MED ORDER — PROPRANOLOL HCL ER 80 MG PO CP24: 80.0000 mg | ORAL_CAPSULE | Freq: Every day | ORAL | Status: DC

## 2013-06-19 MED ORDER — DONEPEZIL HCL 5 MG PO TABS: 5.0000 mg | ORAL_TABLET | Freq: Every day | ORAL | Status: DC

## 2013-06-19 NOTE — Progress Notes (Signed)
Dear Dr. Felicity Coyer,  I saw  Stacy [name] back in the movement disorder clinic clinic for her problem with essential/pharmacologic induced tremor.   She previously saw Dr. Modesto Charon.  I reviewed his notes and will be taking over the patients care. As you may recall, she is a 72 y.o. year old female with a history of bipolar disorder, complete heart block s/p pacemaker placement, left hemispheric ischemic stroke, atrial fibrillation and tremor.  The patient reports tremor for years (at few years ago).  She is R hand dominant but the tremor is in both hands equally.  She reports tremor only when she goes to grab something.  She states that it does not interfere with her eating.  She rarely speills liquid.  She is on lithium and has been on that for several years (lithium started first, then tremor later).   Dr Modesto Charon addressed this as a cause or exacerbator of tremor but her psychiatrist felt that she was unable to come off of it from a bipolar standpoint.  She has tried to decrease caffeine without benefit.   She drinks 2 regular size cups of coffee per day.    .  There is no fam hx of tremor.  She has no SE with the propranolol.  01/16/2013:  Last visit, her propranolol was increased to 60 mg daily.  She is not sure that this really has been particularly helpful for the tremor, but states that it may have improved slightly.  She denies any side effects.  She has not had any syncopal episodes.  She denies daytime fatigue.  She continues to follow with psychiatry for bipolar disorder.  She does remain on Pradaxa are because of history of stroke.  She denies any falls.  She denies any hematochezia or melena.  She has not had any abnormal bleeding.  06/20/13:  The pt reports that her tremor is better with the increased dose of propranolol, 80 mg daily.  She reports no side effects.  She denies dizziness; her daughter writes me a long note and in it she states that pt is dizzy.  The pt adamently denies this.  Her  daughter also states that memory is a big issue and the pt agrees that memory is not as good as it used to be.  Her daughter lives with her.  The pt drives without a problem.  She cooks minimally.  She goes out for most meals.  She does the finances in the home.  She does state that she was supposed to be here in sept but she didn't have the copay and had to cancel.  She did not forget about the appointment.  There is a large amount of stress in the home between the pt and her daughter, who lives with the pt.    Current Outpatient Prescriptions on File Prior to Visit  Medication Sig Dispense Refill  . atorvastatin (LIPITOR) 80 MG tablet Take 80 mg by mouth daily before breakfast.      . BAYER MICROLET LANCETS lancets Use as instructed to check blood sugar once daily dx 250.00  100 each  3  . Calcium Carbonate (CALCIUM 500 PO) Take 1 tablet by mouth at bedtime.       Marland Kitchen desoximetasone (TOPICORT) 0.05 % cream Apply topically 2 (two) times daily.  30 g  0  . FLUoxetine (PROZAC) 20 MG capsule Take 20 mg by mouth daily before breakfast.      . glucose blood (BAYER CONTOUR NEXT TEST)  test strip Use as instructed to check blood sugar once daily dx 250.00  100 each  3  . levothyroxine (SYNTHROID, LEVOTHROID) 100 MCG tablet Take 1 tablet (100 mcg total) by mouth daily. SYNTHROID ONLY, BRAND NAME MEDICALLY NECESSARY.  30 tablet  5  . lithium 300 MG capsule Take 300 mg by mouth 2 (two) times daily with a meal.        . loratadine (CLARITIN) 10 MG tablet Take 1 tablet (10 mg total) by mouth daily.  30 tablet  11  . losartan (COZAAR) 50 MG tablet Take 1 tablet (50 mg total) by mouth daily before breakfast.  90 tablet  1  . metFORMIN (GLUCOPHAGE-XR) 500 MG 24 hr tablet Take 1 tablet (500 mg total) by mouth daily with breakfast.  90 tablet  1  . Multiple Vitamin (MULTIVITAMIN) tablet Take 1 tablet by mouth daily.        Marland Kitchen omeprazole (PRILOSEC) 40 MG capsule Take 40 mg by mouth daily before breakfast.      .  PRADAXA 150 MG CAPS capsule TAKE 1 CAPSULE EVERY 12 HOURS  180 capsule  1  . propranolol ER (INDERAL LA) 80 MG 24 hr capsule Take 80 mg by mouth daily before breakfast.      . solifenacin (VESICARE) 5 MG tablet Take 1 tablet (5 mg total) by mouth daily.  30 tablet  5   No current facility-administered medications on file prior to visit.    Allergies  Allergen Reactions  . Codeine     REACTION: vomiting  . Metronidazole Other (See Comments)    Reaction unknown  . Other     Bandaids cause rash  . Penicillins     REACTION: throat swelling, rash   Past Medical History  Diagnosis Date  . Chronic bipolar disorder   . Coronary artery disease     s/p PTCA  . Complete AV block     s/p PPM--MEDTRONIC ADAPTA ADDr01  . Takotsubo syndrome   . GERD (gastroesophageal reflux disease)   . COPD (chronic obstructive pulmonary disease)     TOBACCO ABUSE  . Abscess of liver(572.0)   . Atrial fibrillation     chronic anticoag - pradaxa  . BIPOLAR AFFECTIVE DISORDER   . DEPRESSION   . DIABETES MELLITUS, TYPE II dx 04/2010  . DYSLIPIDEMIA   . HYPERTENSION   . HYPOTHYROIDISM     hashimoto's  . TOBACCO ABUSE   . Pacemaker-Medtronic-dual-chamber   . Parathyroid related hypercalcemia   . Primary hyperparathyroidism   . Vitamin D deficiency disease   . Cancer     basal cell on abdomen   Past Surgical History  Procedure Laterality Date  . Insert / replace / remove pacemaker      MEDTRONIC ADAPTA ADDr01  . Parathyroidectomy      RIGHT INFERIOR  . Abdominal hysterectomy    . Cardiac catheterization    . Mass excision Left 01/27/2013    Procedure: EXCISION MASS LEFT FLANK;  Surgeon: Velora Heckler, MD;  Location: [facility];  Service: General;  Laterality: Left;   History   Social History  . Marital Status: Single    Spouse Name: N/A    Number of Children: 2  . Years of Education: N/A   Occupational History  .     Social History Main Topics  . Smoking status: Current Every Day Smoker --  0.50 packs/day for 48 years    Types: Cigarettes  . Smokeless tobacco: Never Used  Comment: switched to vapor cigs. Widowed- 2 girls. Lives with daughter (who is nurse at [employer])  . Alcohol Use: No     Comment: "once in a blue moon" when I have [place] food  . Drug Use: No  . Sexual Activity: Not on file   Other Topics Concern  . Not on file   Social History Narrative   WIDOWED   2 DAUGHTERS   LIVES W/DAUGHTER   CURRENTLY SMOKES   NO ALCOHOL USE   NO ILLICIT DRUG USE   DAILY CAFFEINE USE      PPM-MEDTRONIC   PATIENT SIGNED A DESIGNATED PARTY RELEASE TO ALLOW DAUGHTER, NICOLE COX, TO HAVE ACCESS TO HER MEDICAL RECORDS/INFORMATION. LATOYA BATTLE, November 09, 2009 9:19 AM     ROS:  Occasional diarrhea.  10 systems were reviewed and  are unremarkable except as reported above.  Exam: . Filed Vitals:   06/19/13 0921  BP: 118/60  Pulse: 60  Temp: 98.6 F (37 C)  Resp: 12  Height: 5\' 6"  (1.676 m)  Weight: 124 lb 8 oz (56.473 kg)    Gen:  Appears stated age and in NAD. HEENT:  Normocephalic, atraumatic. The mucous membranes are moist. The superficial temporal arteries are without ropiness or tenderness. Cardiovascular: Regular rate and rhythm. Lungs: Clear to auscultation bilaterally. Neck: There are no carotid bruits noted bilaterally.  NEUROLOGICAL:  Orientation:  A MoCA was done and the pt scored 15/30.   Cranial nerves: There is good facial symmetry. The pupils are equal round and reactive to light bilaterally. Fundoscopic exam reveals clear disc margins bilaterally. Extraocular muscles are intact and visual fields are full to confrontational testing. Speech is fluent and clear. Soft palate rises symmetrically and there is no tongue deviation. Hearing is intact to conversational tone. Tone: Tone is good throughout. Sensation: Sensation is intact to light touch and pinprick throughout (facial, trunk, extremities). Vibration is intact at the bilateral big toe. There is  no extinction with double simultaneous stimulation. There is no sensory dermatomal level identified. Coordination:  The patient has no difficulty with RAM's or FNF bilaterally. Motor: Strength is 5/5 in the bilateral upper and lower extremities.  Shoulder shrug is equal bilaterally.  There is no pronator drift.  There are no fasciculations noted. DTR's: Deep tendon reflexes are 3/4 at the bilateral biceps, triceps, brachioradialis, patella and 1/4 at the bilateral achilles.  Plantar responses are downgoing bilaterally. Gait and Station: The patient is able to ambulate without difficulty.   Movement examination:  There is almost no postural tremor.  She does have some head titubation that is fairly minimal.  It is in the "yes" direction.  Archimedes spirals are improved c/t last visit.  LABS:  Lab Results  Component Value Date   WBC 11.4* 01/24/2013   HGB 14.6 01/24/2013   HCT 43.3 01/24/2013   MCV 93.9 01/24/2013   PLT 202 01/24/2013     Chemistry      Component Value Date/Time   NA 139 01/24/2013 1210   K 4.1 01/24/2013 1210   CL 102 01/24/2013 1210   CO2 30 01/24/2013 1210   BUN 10 01/24/2013 1210   CREATININE 0.78 01/24/2013 1210      Component Value Date/Time   CALCIUM 10.0 01/24/2013 1210   CALCIUM 10.4 10/25/2011 1405   ALKPHOS 55 01/16/2013 1403   AST 17 01/16/2013 1403   ALT 20 01/16/2013 1403   BILITOT 0.8 01/16/2013 1403     Lab Results  Component Value  Date   TSH 3.575 05/05/2013   Lab Results  Component Value Date   LITHIUM 0.80 10/30/2012   NA 139 01/24/2013   BUN 10 01/24/2013   CREATININE 0.78 01/24/2013   TSH 3.575 05/05/2013   WBC 11.4* 01/24/2013    No results found for this basename: [id number]    Impression/Recommendations: 1.  Tremor -   -This is at least exacerbated if not caused by the lithium being used for bipolar d/o.  Since stopping her lithium is not an option, she would like to continue on the propranolol.  We have seen improvement on the 80 mg dosage,  without SE.  Risks, benefits, side effects and alternative therapies were discussed.  The opportunity to ask questions was given and they were answered to the best of my ability.  The patient expressed understanding and willingness to follow the outlined treatment protocols. 2.  Memory Change  -I think that this is likely multifactorial.  I do think that medication and depression play a role.  I also think that she potentially has the onset of a neurodegenerative d/o such as AD.  -We will initiate Aricept 5 mg daily for a month and then increase to 10 mg daily.  Risks, benefits, side effects and alternative therapies were discussed.  The opportunity to ask questions was given and they were answered to the best of my ability.  The patient expressed understanding and willingness to follow the outlined treatment protocols.  Pt safety was discussed.   3.  History of stroke - CTA head and neck unremarkable.  W/U complete, presumed cardioembolic, patient now on pradaxa. 4.  I am going to plan on seeing the patient back in the next-3 months.

## 2013-06-19 NOTE — Patient Instructions (Signed)
1.  Continue with the propranolol 80 mg - one tablet daily 2.  Start aricept - 5 mg daily for a month and then increase to 10 mg daily 3.  I will see you back in 3-4 months.

## 2013-06-20 ENCOUNTER — Encounter: Payer: Self-pay | Admitting: Internal Medicine

## 2013-07-12 ENCOUNTER — Other Ambulatory Visit: Payer: Self-pay | Admitting: Internal Medicine

## 2013-07-16 ENCOUNTER — Encounter: Payer: Self-pay | Admitting: *Deleted

## 2013-07-18 ENCOUNTER — Other Ambulatory Visit: Payer: Self-pay | Admitting: "Endocrinology

## 2013-07-21 ENCOUNTER — Other Ambulatory Visit: Payer: Self-pay

## 2013-07-21 ENCOUNTER — Encounter: Payer: Self-pay | Admitting: Internal Medicine

## 2013-07-24 ENCOUNTER — Encounter: Payer: Self-pay | Admitting: Internal Medicine

## 2013-07-24 ENCOUNTER — Ambulatory Visit (INDEPENDENT_AMBULATORY_CARE_PROVIDER_SITE_OTHER): Payer: Medicare HMO | Admitting: Internal Medicine

## 2013-07-24 VITALS — BP 130/90 | HR 73 | Temp 97.7°F | Wt 122.8 lb

## 2013-07-24 DIAGNOSIS — E119 Type 2 diabetes mellitus without complications: Secondary | ICD-10-CM

## 2013-07-24 DIAGNOSIS — E038 Other specified hypothyroidism: Secondary | ICD-10-CM

## 2013-07-24 DIAGNOSIS — Z1239 Encounter for other screening for malignant neoplasm of breast: Secondary | ICD-10-CM

## 2013-07-24 DIAGNOSIS — R259 Unspecified abnormal involuntary movements: Secondary | ICD-10-CM

## 2013-07-24 DIAGNOSIS — E063 Autoimmune thyroiditis: Secondary | ICD-10-CM

## 2013-07-24 DIAGNOSIS — E785 Hyperlipidemia, unspecified: Secondary | ICD-10-CM

## 2013-07-24 NOTE — Patient Instructions (Addendum)
It was good to see you today.  We have reviewed your prior records including labs and tests today  Medications reviewed and updated  No changes recommended at this time  we'll make referral to [facility] for mammogram screening. Our office will contact you regarding appointment(s) once made.  Continue working with your other specialists for thyroid and diabetes and tremor and mood as reviewed  Followup in 12 months for annual exam and labs, please call sooner if problems

## 2013-07-24 NOTE — Assessment & Plan Note (Signed)
History of thyroiditis, on hormone replacement therapy check every 6-12 months with endo and adjust tx as needed Lab Results  Component Value Date   TSH 3.575 05/05/2013

## 2013-07-24 NOTE — Progress Notes (Signed)
Pre-visit discussion using our clinic review tool. No additional management support is needed unless otherwise documented below in the visit note.  

## 2013-07-24 NOTE — Assessment & Plan Note (Signed)
Prior neuro eval 2011 felt related to Lithium use (consult reviewed) Now working with Dr Tat for same - medications reviewed - on propanolol and feels improved continue present plan and medications.

## 2013-07-24 NOTE — Assessment & Plan Note (Signed)
On atorva for same - check lipids annually, adjust as needed

## 2013-07-24 NOTE — Progress Notes (Signed)
Subjective:    Patient ID: Stacy [name], female    DOB: [birth year], 72 y.o.   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 denies adverse side effects related to current therapy. no myalgia   Atrial fibrillation - on pradaxa for anticoag = follows with cards for same   CAD issues - reviewed hx - reports compliance with ongoing medical treatment and no changes in medication dose or frequency. denies adverse side effects related to current therapy.    diabetes mellitus 2 -dx by incidental labs 06/2010 at psy[facility] -on metformin for same since then and follows with endo- the patient reports compliance with medication(s) as prescribed. Denies adverse side effects.  Past Medical History  Diagnosis Date  . Chronic bipolar disorder   . Coronary artery disease     s/p PTCA  . Complete AV block     s/p PPM--MEDTRONIC ADAPTA ADDr01  . Takotsubo syndrome   . GERD (gastroesophageal reflux disease)   . COPD (chronic obstructive pulmonary disease)     TOBACCO ABUSE  . Abscess of liver(572.0)   . Atrial fibrillation     chronic anticoag - pradaxa  . BIPOLAR AFFECTIVE DISORDER   . DEPRESSION   . DIABETES MELLITUS, TYPE II dx 04/2010  . DYSLIPIDEMIA   . HYPERTENSION   . HYPOTHYROIDISM    hashimoto's  . TOBACCO ABUSE   . Pacemaker-Medtronic-dual-chamber   . Parathyroid related hypercalcemia   . Primary hyperparathyroidism   . Vitamin D deficiency disease   . Cancer     basal cell on abdomen     Review of Systems  Constitutional: Positive for fatigue. Negative for fever and unexpected weight change.  HENT: Positive for postnasal drip and rhinorrhea. Negative for sore throat.   Eyes: Positive for visual disturbance (working with optho on same for dry eyes).  Genitourinary: Positive for urgency.  Neurological: Positive for tremors (chronic, related to [facility]). Negative for syncope and weakness.       Objective:   Physical Exam  BP 130/90  Pulse 73  Temp(Src) 97.7 F (36.5 C) (Oral)  Wt 122 lb 12.8 oz (55.702 kg)  SpO2 97% Wt Readings from Last 3 Encounters:  07/24/13 122 lb 12.8 oz (55.702 kg)  06/19/13 124 lb 8 oz (56.473 kg)  05/31/13 123 lb (55.792 kg)   Constitutional: She appears well-developed and well-nourished. No distress.  Neck: Normal range of motion. Neck supple. No JVD present. No thyromegaly present.  Cardiovascular: Normal rate, regular rhythm and normal heart sounds.  No murmur heard. No BLE edema. Pulmonary/Chest: Effort normal and breath sounds normal. No respiratory distress. She has no wheezes.  Neurological: She is alert and oriented to person, place, and time. No cranial nerve deficit. Coordination normal. very mild tremor  BUE, intention>rest Psychiatric: She has a normal mood and affect. Her behavior is normal. Judgment and thought content normal.   Lab Results  Component Value Date   WBC 11.4* 01/24/2013   HGB 14.6 01/24/2013   HCT 43.3 01/24/2013   PLT 202 01/24/2013   GLUCOSE 103* 01/24/2013   CHOL 93 01/30/2013   TRIG 61.0 01/30/2013   HDL 56.60 01/30/2013   LDLDIRECT 77.5 08/11/2009   LDLCALC 24 01/30/2013   ALT 20 01/16/2013   AST 17 01/16/2013   NA 139 01/24/2013   K 4.1 01/24/2013   CL 102 01/24/2013   CREATININE 0.78 01/24/2013   BUN  10 01/24/2013   CO2 30 01/24/2013   TSH 3.575 05/05/2013   INR 2.0 10/21/2009   HGBA1C 5.4 05/08/2013       Assessment & Plan:   See problem list. Medications and labs reviewed today.  Time spent with pt today 25 minutes, greater than 50% time spent counseling patient on diabetes, tremor, depression hx and medication review. Also review of prior records

## 2013-07-24 NOTE — Assessment & Plan Note (Signed)
Dx 04/2010 on incidental labs - on metformin since then intermittently follows with endo for same ([facility]), but considering following at PCP here overall controlled by hx and labs On statin, ARB -  Follows with [facility] exam annually  Check a1c q6mo and adjust tx as needed Lab Results  Component Value Date   HGBA1C 5.4 05/08/2013   

## 2013-08-04 ENCOUNTER — Encounter: Payer: Self-pay | Admitting: Internal Medicine

## 2013-08-04 ENCOUNTER — Ambulatory Visit (INDEPENDENT_AMBULATORY_CARE_PROVIDER_SITE_OTHER): Payer: Medicare HMO | Admitting: Internal Medicine

## 2013-08-04 ENCOUNTER — Telehealth: Payer: Self-pay | Admitting: *Deleted

## 2013-08-04 VITALS — BP 148/90 | HR 84 | Temp 98.1°F

## 2013-08-04 DIAGNOSIS — R259 Unspecified abnormal involuntary movements: Secondary | ICD-10-CM

## 2013-08-04 DIAGNOSIS — I4891 Unspecified atrial fibrillation: Secondary | ICD-10-CM

## 2013-08-04 DIAGNOSIS — M7072 Other bursitis of hip, left hip: Secondary | ICD-10-CM

## 2013-08-04 DIAGNOSIS — M76899 Other specified enthesopathies of unspecified lower limb, excluding foot: Secondary | ICD-10-CM

## 2013-08-04 DIAGNOSIS — J309 Allergic rhinitis, unspecified: Secondary | ICD-10-CM | POA: Insufficient documentation

## 2013-08-04 DIAGNOSIS — R269 Unspecified abnormalities of gait and mobility: Secondary | ICD-10-CM

## 2013-08-04 MED ORDER — AZELASTINE HCL 0.1 % NA SOLN: 2.0000 | Freq: Two times a day (BID) | NASAL | Status: DC

## 2013-08-04 NOTE — Telephone Encounter (Signed)
[facility] Triage Call Report Triage Record Num: [id number] Operator: Karenann Cai Patient Name: Stacy [name] Call Date & Time: 08/03/2013 10:21:21AM Patient Phone: [phone] PCP: Rene Paci Patient Gender: Female PCP Fax : [phone] Patient DOB: 1941/07/06 Practice Name: [facility] Reason for Call: Caller: Salley Slaughter; PCP: Rene Paci (Adults only); CB#: [phone]; reason for call: sore throat and nasal drainage. Afebrile. Onset of sore throat: 08/01/2013. Patient fell on 07/31/2013: patient tripped and bruised left arm. Other symptom: intermittent dizziness (none on 12/28). Patient visualizes white patch on the back of her throat. Disposition: see Provider within 24 hours due to answering yes to yellow white patches on the back of her throat. RN reviewed sore throat care advice with patient. Patient advised to contact office staff in the morning to schedule an appointment with PCP (12/29). Protocol(s) Used: Sore Throat or Hoarseness Recommended Outcome per Protocol: See Provider within 24 hours Reason for Outcome: Has enlarged tonsils covered by yellow-white patches Care Advice: Analgesic/Antipyretic Advice - Acetaminophen: Consider acetaminophen as directed on label or by pharmacist/provider for pain or fever PRECAUTIONS: - Use if there is no history of liver disease, alcoholism, or intake of three or more alcohol drinks per day - Only if approved by provider during pregnancy or when breastfeeding - During pregnancy, acetaminophen should not be taken more than 3 consecutive days without telling provider - Do not exceed recommended dose or frequency ~ Sore Throat Relief: - Use salt water gargles (1/2 teaspoon salt in 8 oz. [27mL] warm water) every one to two hours. - Use a vaporizer or cool mist humidifier in the room when sleeping. - Suck on hard candy, nonprescription or herbal throat lozenges (sugar-free if diabetic) - Eat soothing, soft  food/fluids (broths, soups, or honey and lemon juice in hot tea, Popsicles, frozen yogurt or sherbet, scrambled eggs, cooked cereals, Jell-O or puddings) whichever is most comforting. - Avoid eating salty, spicy or acidic foods.

## 2013-08-04 NOTE — Assessment & Plan Note (Signed)
Add Astelin nose spray - erx done

## 2013-08-04 NOTE — Patient Instructions (Addendum)
It was good to see you today.  We have reviewed your prior records including labs and tests today  Medications reviewed and updated   Start Astelin nose spray for nose and thoat symptoms -No other changes recommended at this time  we'll make referral to physical therapy at [facility] for your left leg as ongoing and your balance. Our office will contact you regarding appointment(s) once made.  Continue working with your other specialists for thyroid and diabetes and tremor and mood as reviewed  Followup in 3 months for review, please call sooner if problems  Fall Prevention and Home Safety Falls cause injuries and can affect all age groups. It is possible to use preventive measures to significantly decrease the likelihood of falls. There are many simple measures which can make your home safer and prevent falls. OUTDOORS  Repair cracks and edges of walkways and driveways.  Remove high doorway thresholds.  Trim shrubbery on the main path into your home.  Have good outside lighting.  Clear walkways of tools, rocks, debris, and clutter.  Check that handrails are not broken and are securely fastened. Both sides of steps should have handrails.  Have leaves, snow, and ice cleared regularly.  Use sand or salt on walkways during winter months.  In the garage, clean up grease or oil spills. BATHROOM  Install night lights.  Install grab bars by the toilet and in the tub and shower.  Use non-skid mats or decals in the tub or shower.  Place a plastic non-slip stool in the shower to sit on, if needed.  Keep floors dry and clean up all water on the floor immediately.  Remove soap buildup in the tub or shower on a regular basis.  Secure bath mats with non-slip, double-sided rug tape.  Remove throw rugs and tripping hazards from the floors. BEDROOMS  Install night lights.  Make sure a bedside light is easy to reach.  Do not use oversized bedding.  Keep a telephone by your  bedside.  Have a firm chair with side arms to use for getting dressed.  Remove throw rugs and tripping hazards from the floor. KITCHEN  Keep handles on pots and pans turned toward the center of the stove. Use back burners when possible.  Clean up spills quickly and allow time for drying.  Avoid walking on wet floors.  Avoid hot utensils and knives.  Position shelves so they are not too high or low.  Place commonly used objects within easy reach.  If necessary, use a sturdy step stool with a grab bar when reaching.  Keep electrical cables out of the way.  Do not use floor polish or wax that makes floors slippery. If you must use wax, use non-skid floor wax.  Remove throw rugs and tripping hazards from the floor. STAIRWAYS  Never leave objects on stairs.  Place handrails on both sides of stairways and use them. Fix any loose handrails. Make sure handrails on both sides of the stairways are as long as the stairs.  Check carpeting to make sure it is firmly attached along stairs. Make repairs to worn or loose carpet promptly.  Avoid placing throw rugs at the top or bottom of stairways, or properly secure the rug with carpet tape to prevent slippage. Get rid of throw rugs, if possible.  Have an electrician put in a light switch at the top and bottom of the stairs. OTHER FALL PREVENTION TIPS  Wear low-heel or rubber-soled shoes that are supportive and fit  well. Wear closed toe shoes.  When using a stepladder, make sure it is fully opened and both spreaders are firmly locked. Do not climb a closed stepladder.  Add color or contrast paint or tape to grab bars and handrails in your home. Place contrasting color strips on first and last steps.  Learn and use mobility aids as needed. Install an electrical emergency response system.  Turn on lights to avoid dark areas. Replace light bulbs that burn out immediately. Get light switches that glow.  Arrange furniture to create clear  pathways. Keep furniture in the same place.  Firmly attach carpet with non-skid or double-sided tape.  Eliminate uneven floor surfaces.  Select a carpet pattern that does not visually hide the edge of steps.  Be aware of all pets. OTHER HOME SAFETY TIPS  Set the water temperature for 120 F (48.8 C).  Keep emergency numbers on or near the telephone.  Keep smoke detectors on every level of the home and near sleeping areas. Document Released: 07/14/2002 Document Revised: 01/23/2012 Document Reviewed: 10/13/2011 [facility] Patient Information 2014 [place], [place].

## 2013-08-04 NOTE — Assessment & Plan Note (Signed)
Rate controlled, asymptomatic  anticoag with pradaxa - continue to review risk vs benefit of same esp with fall 07/2013 (brusie to L side chest and arm but no injury) Follows with cards annually and prn

## 2013-08-04 NOTE — Progress Notes (Signed)
Subjective:    Patient ID: Stacy [name], female    DOB: [birth year], 72 y.o.   MRN: [id number]  HPI  Here for followup Complains of runny nose x3 weeks Also bruises from fall last week while reaching across bedside table to turn off alarm  Past Medical History  Diagnosis Date  . Chronic bipolar disorder   . Coronary artery disease     s/p PTCA  . Complete AV block     s/p PPM--MEDTRONIC ADAPTA ADDr01  . Takotsubo syndrome   . GERD (gastroesophageal reflux disease)   . COPD (chronic obstructive pulmonary disease)     TOBACCO ABUSE  . Abscess of liver(572.0)   . Atrial fibrillation     chronic anticoag - pradaxa  . BIPOLAR AFFECTIVE DISORDER   . DEPRESSION   . DIABETES MELLITUS, TYPE II dx 04/2010  . DYSLIPIDEMIA   . HYPERTENSION   . HYPOTHYROIDISM     hashimoto's  . TOBACCO ABUSE   . Pacemaker-Medtronic-dual-chamber   . Parathyroid related hypercalcemia   . Primary hyperparathyroidism   . Vitamin D deficiency disease   . Cancer     basal cell on abdomen    Review of Systems  Constitutional: Negative for fever and unexpected weight change.  HENT: Positive for postnasal drip, rhinorrhea and sore throat. Negative for sinus pressure and sneezing.   Respiratory: Positive for cough. Negative for choking and shortness of breath.   Musculoskeletal: Positive for gait problem (?). Negative for myalgias and neck stiffness.  Neurological: Positive for dizziness (occ) and tremors. Negative for facial asymmetry, speech difficulty and weakness.       Objective:   Physical Exam BP 148/90  Pulse 84  Temp(Src) 98.1 F (36.7 C) (Oral)  SpO2 96% Wt Readings from Last 3 Encounters:  07/24/13 122 lb 12.8 oz (55.702 kg)  06/19/13 124 lb 8 oz (56.473 kg)  05/31/13 123 lb (55.792 kg)   Constitutional: She appears well-developed and well-nourished. No distress.  HENT: Head: Normocephalic and atraumatic. Ears: B TMs ok, no erythema or effusion; Nose: pale turbinates without  swelling. Clear rhinorrhea. mouth/Throat: Oropharynx is clear and moist. No oropharyngeal exudate.  Eyes: Conjunctivae and EOM are normal. Pupils are equal, round, and reactive to light. No scleral icterus.  Neck: Normal range of motion. Neck supple. No JVD present. No thyromegaly present.  Cardiovascular: Normal rate, regular rhythm and normal heart sounds.  No murmur heard. No BLE edema. Pulmonary/Chest: Effort normal and breath sounds normal. No respiratory distress. She has no wheezes.  Musculoskeletal: Normal range of motion, no joint effusions. No gross deformities Neurological: resting tremor hands greater than head and neck. She is alert and oriented to person, place, and time. No cranial nerve deficit. Coordination, strength, speech are normal. observe gait and balance are slow but normal, unaided Skin: Bruising left-side of lower chest wall and left forearm/elbow. No joint effusions. Non-tender to bony palpation. Full range of motion. No laceration or open skin -Skin is warm and dry. No rash noted. No erythema.  Psychiatric: She has a normal mood and affect. Her behavior is normal. Judgment and thought content normal.   Lab Results  Component Value Date   WBC 11.4* 01/24/2013   HGB 14.6 01/24/2013   HCT 43.3 01/24/2013   PLT 202 01/24/2013   GLUCOSE 103* 01/24/2013   CHOL 93 01/30/2013   TRIG 61.0 01/30/2013   HDL 56.60 01/30/2013   LDLDIRECT 77.5 08/11/2009   LDLCALC 24 01/30/2013   ALT 20 01/16/2013  AST 17 01/16/2013   NA 139 01/24/2013   K 4.1 01/24/2013   CL 102 01/24/2013   CREATININE 0.78 01/24/2013   BUN 10 01/24/2013   CO2 30 01/24/2013   TSH 3.575 05/05/2013   INR 2.0 10/21/2009   HGBA1C 5.4 05/08/2013        Assessment & Plan:   Allergic rhinitis with rhinorrhea and sore throat causing cough No evidence for infection on exam Begin nasal Astelin  Continue Claritin daily Consider ENT evaluation if symptoms unimproved Advised on need for tobacco cessation  Accidental fall  last week. Soft tissue trauma to left chest wall and upper extremity, no skeletal injury but bruising observed Complicated by chronic anticoagulation for A. Fib Neurology feels unrelated to tremor, will refer to physical therapy for gait training and balance training Advised on fall precautions  Time spent with pt today 25 minutes, greater than 50% time spent counseling patient on tremor, fall last week, runny nose and medication review. Also review of prior records and letter from dtr Renee (scanned into EMR)

## 2013-08-04 NOTE — Assessment & Plan Note (Signed)
Prior neuro eval 2011 felt related to Lithium use (consult reviewed) Now working with Dr Tat for same - medications reviewed -  on propanolol and feels improved Fall last week - accidental while reaching for alarm on bedside table - will refer to PT as ongoing for L hip bursitis (per ortho/[facility] med refer from [facility]) continue present plan and medications.

## 2013-08-04 NOTE — Progress Notes (Signed)
Pre-visit discussion using our clinic review tool. No additional management support is needed unless otherwise documented below in the visit note.  

## 2013-08-05 ENCOUNTER — Other Ambulatory Visit: Payer: Self-pay | Admitting: *Deleted

## 2013-08-05 ENCOUNTER — Other Ambulatory Visit: Payer: Self-pay | Admitting: Internal Medicine

## 2013-08-05 ENCOUNTER — Telehealth: Payer: Self-pay | Admitting: *Deleted

## 2013-08-05 ENCOUNTER — Ambulatory Visit (HOSPITAL_COMMUNITY)
Admission: RE | Admit: 2013-08-05 | Discharge: 2013-08-05 | Disposition: A | Discharge status: Home / Self Care | Payer: Medicare HMO | Source: Ambulatory Visit | Attending: Internal Medicine | Admitting: Internal Medicine

## 2013-08-05 DIAGNOSIS — Z1231 Encounter for screening mammogram for malignant neoplasm of breast: Secondary | ICD-10-CM | POA: Insufficient documentation

## 2013-08-05 DIAGNOSIS — R251 Tremor, unspecified: Secondary | ICD-10-CM

## 2013-08-05 DIAGNOSIS — Z1239 Encounter for other screening for malignant neoplasm of breast: Secondary | ICD-10-CM

## 2013-08-05 DIAGNOSIS — W19XXXA Unspecified fall, initial encounter: Secondary | ICD-10-CM

## 2013-08-05 DIAGNOSIS — R269 Unspecified abnormalities of gait and mobility: Secondary | ICD-10-CM

## 2013-08-05 MED ORDER — AZELASTINE HCL 0.1 % NA SOLN: 2.0000 | Freq: Two times a day (BID) | NASAL | Status: DC

## 2013-08-05 NOTE — Telephone Encounter (Signed)
Pts daughter Bjorn Loser called to request whether [facility] would be an option for pt to assist with ADLs.  Please advise

## 2013-08-05 NOTE — Telephone Encounter (Signed)
Will order.

## 2013-08-06 NOTE — Telephone Encounter (Signed)
Left detailed message on VM.

## 2013-08-11 ENCOUNTER — Telehealth: Payer: Self-pay | Admitting: Internal Medicine

## 2013-08-11 ENCOUNTER — Emergency Department (HOSPITAL_COMMUNITY)
Admission: EM | Admit: 2013-08-11 | Discharge: 2013-08-11 | Disposition: A | Discharge status: Home / Self Care | Payer: Medicare HMO | Attending: Emergency Medicine | Admitting: Emergency Medicine

## 2013-08-11 ENCOUNTER — Emergency Department (HOSPITAL_COMMUNITY): Payer: Medicare HMO

## 2013-08-11 ENCOUNTER — Encounter (HOSPITAL_COMMUNITY): Payer: Self-pay | Admitting: Emergency Medicine

## 2013-08-11 DIAGNOSIS — Z950 Presence of cardiac pacemaker: Secondary | ICD-10-CM | POA: Insufficient documentation

## 2013-08-11 DIAGNOSIS — F17200 Nicotine dependence, unspecified, uncomplicated: Secondary | ICD-10-CM | POA: Insufficient documentation

## 2013-08-11 DIAGNOSIS — J4489 Other specified chronic obstructive pulmonary disease: Secondary | ICD-10-CM | POA: Insufficient documentation

## 2013-08-11 DIAGNOSIS — Z79899 Other long term (current) drug therapy: Secondary | ICD-10-CM

## 2013-08-11 DIAGNOSIS — K219 Gastro-esophageal reflux disease without esophagitis: Secondary | ICD-10-CM | POA: Insufficient documentation

## 2013-08-11 DIAGNOSIS — Z95818 Presence of other cardiac implants and grafts: Secondary | ICD-10-CM | POA: Insufficient documentation

## 2013-08-11 DIAGNOSIS — F319 Bipolar disorder, unspecified: Secondary | ICD-10-CM | POA: Insufficient documentation

## 2013-08-11 DIAGNOSIS — T56894A Toxic effect of other metals, undetermined, initial encounter: Secondary | ICD-10-CM | POA: Insufficient documentation

## 2013-08-11 DIAGNOSIS — E039 Hypothyroidism, unspecified: Secondary | ICD-10-CM | POA: Insufficient documentation

## 2013-08-11 DIAGNOSIS — Z880 Allergy status to penicillin: Secondary | ICD-10-CM | POA: Insufficient documentation

## 2013-08-11 DIAGNOSIS — Z91040 Latex allergy status: Secondary | ICD-10-CM | POA: Insufficient documentation

## 2013-08-11 DIAGNOSIS — R259 Unspecified abnormal involuntary movements: Secondary | ICD-10-CM | POA: Insufficient documentation

## 2013-08-11 DIAGNOSIS — I10 Essential (primary) hypertension: Secondary | ICD-10-CM | POA: Insufficient documentation

## 2013-08-11 DIAGNOSIS — T5691XA Toxic effect of unspecified metal, accidental (unintentional), initial encounter: Secondary | ICD-10-CM | POA: Insufficient documentation

## 2013-08-11 DIAGNOSIS — J449 Chronic obstructive pulmonary disease, unspecified: Secondary | ICD-10-CM | POA: Insufficient documentation

## 2013-08-11 DIAGNOSIS — Z791 Long term (current) use of non-steroidal anti-inflammatories (NSAID): Secondary | ICD-10-CM | POA: Insufficient documentation

## 2013-08-11 DIAGNOSIS — R253 Fasciculation: Secondary | ICD-10-CM

## 2013-08-11 DIAGNOSIS — I4891 Unspecified atrial fibrillation: Secondary | ICD-10-CM | POA: Insufficient documentation

## 2013-08-11 DIAGNOSIS — I2510 Atherosclerotic heart disease of native coronary artery without angina pectoris: Secondary | ICD-10-CM | POA: Insufficient documentation

## 2013-08-11 DIAGNOSIS — Z85828 Personal history of other malignant neoplasm of skin: Secondary | ICD-10-CM | POA: Insufficient documentation

## 2013-08-11 DIAGNOSIS — Y929 Unspecified place or not applicable: Secondary | ICD-10-CM | POA: Insufficient documentation

## 2013-08-11 DIAGNOSIS — E785 Hyperlipidemia, unspecified: Secondary | ICD-10-CM | POA: Insufficient documentation

## 2013-08-11 DIAGNOSIS — Y939 Activity, unspecified: Secondary | ICD-10-CM | POA: Insufficient documentation

## 2013-08-11 DIAGNOSIS — E119 Type 2 diabetes mellitus without complications: Secondary | ICD-10-CM | POA: Insufficient documentation

## 2013-08-11 LAB — RAPID URINE DRUG SCREEN, HOSP PERFORMED
AMPHETAMINES: NOT DETECTED
BENZODIAZEPINES: NOT DETECTED
Barbiturates: NOT DETECTED
Cocaine: NOT DETECTED
Opiates: NOT DETECTED
TETRAHYDROCANNABINOL: NOT DETECTED

## 2013-08-11 LAB — CBC
HEMATOCRIT: 41.3 % (ref 36.0–46.0)
Hemoglobin: 13.6 g/dL (ref 12.0–15.0)
MCH: 32 pg (ref 26.0–34.0)
MCHC: 32.9 g/dL (ref 30.0–36.0)
MCV: 97.2 fL (ref 78.0–100.0)
Platelets: 208 10*3/uL (ref 150–400)
RBC: 4.25 MIL/uL (ref 3.87–5.11)
RDW: 12.8 % (ref 11.5–15.5)
WBC: 12 10*3/uL — ABNORMAL HIGH (ref 4.0–10.5)

## 2013-08-11 LAB — COMPREHENSIVE METABOLIC PANEL
ALT: 40 U/L — ABNORMAL HIGH (ref 0–35)
AST: 27 U/L (ref 0–37)
Albumin: 3.5 g/dL (ref 3.5–5.2)
Alkaline Phosphatase: 93 U/L (ref 39–117)
BUN: 21 mg/dL (ref 6–23)
CALCIUM: 10.3 mg/dL (ref 8.4–10.5)
CO2: 30 mEq/L (ref 19–32)
CREATININE: 1.02 mg/dL (ref 0.50–1.10)
Chloride: 103 mEq/L (ref 96–112)
GFR calc Af Amer: 62 mL/min — ABNORMAL LOW (ref >90)
GFR calc non Af Amer: 54 mL/min — ABNORMAL LOW (ref >90)
Glucose, Bld: 110 mg/dL — ABNORMAL HIGH (ref 70–99)
Potassium: 4.3 mEq/L (ref 3.7–5.3)
Sodium: 139 mEq/L (ref 137–147)
Total Bilirubin: 0.3 mg/dL (ref 0.3–1.2)
Total Protein: 6.7 g/dL (ref 6.0–8.3)

## 2013-08-11 LAB — URINALYSIS, ROUTINE W REFLEX MICROSCOPIC
BILIRUBIN URINE: NEGATIVE
Glucose, UA: NEGATIVE mg/dL
Hgb urine dipstick: NEGATIVE
Ketones, ur: NEGATIVE mg/dL
Leukocytes, UA: NEGATIVE
Nitrite: NEGATIVE
Protein, ur: NEGATIVE mg/dL
Specific Gravity, Urine: 1.01 (ref 1.005–1.030)
UROBILINOGEN UA: 0.2 mg/dL (ref 0.0–1.0)
pH: 7 (ref 5.0–8.0)

## 2013-08-11 LAB — DIFFERENTIAL
Basophils Absolute: 0.1 10*3/uL (ref 0.0–0.1)
Basophils Relative: 0 % (ref 0–1)
Eosinophils Absolute: 0.2 10*3/uL (ref 0.0–0.7)
Eosinophils Relative: 2 % (ref 0–5)
Lymphocytes Relative: 15 % (ref 12–46)
Lymphs Abs: 1.8 10*3/uL (ref 0.7–4.0)
MONO ABS: 0.7 10*3/uL (ref 0.1–1.0)
Monocytes Relative: 6 % (ref 3–12)
NEUTROS ABS: 9.2 10*3/uL — AB (ref 1.7–7.7)
Neutrophils Relative %: 77 % (ref 43–77)

## 2013-08-11 LAB — APTT: APTT: 53 s — AB (ref 24–37)

## 2013-08-11 LAB — POCT I-STAT TROPONIN I: Troponin i, poc: 0 ng/mL (ref 0.00–0.08)

## 2013-08-11 LAB — POCT I-STAT, CHEM 8
BUN: 22 mg/dL (ref 6–23)
CREATININE: 1.2 mg/dL — AB (ref 0.50–1.10)
Calcium, Ion: 1.43 mmol/L — ABNORMAL HIGH (ref 1.13–1.30)
Chloride: 102 mEq/L (ref 96–112)
GLUCOSE: 109 mg/dL — AB (ref 70–99)
HCT: 43 % (ref 36.0–46.0)
HEMOGLOBIN: 14.6 g/dL (ref 12.0–15.0)
POTASSIUM: 4.1 meq/L (ref 3.7–5.3)
Sodium: 138 mEq/L (ref 137–147)
TCO2: 27 mmol/L (ref 0–100)

## 2013-08-11 LAB — LITHIUM LEVEL: Lithium Lvl: 1.59 mEq/L (ref 0.80–1.40)

## 2013-08-11 LAB — PROTIME-INR
INR: 1.23 (ref 0.00–1.49)
PROTHROMBIN TIME: 15.2 s (ref 11.6–15.2)

## 2013-08-11 LAB — GLUCOSE, CAPILLARY: Glucose-Capillary: 112 mg/dL — ABNORMAL HIGH (ref 70–99)

## 2013-08-11 LAB — ETHANOL: Alcohol, Ethyl (B): <11 mg/dL (ref 0–11)

## 2013-08-11 LAB — TROPONIN I: Troponin I: <0.3 ng/mL (ref <0.30)

## 2013-08-11 NOTE — ED Notes (Signed)
Daughter states that her mom is acting much better and seems to be improving significantly.

## 2013-08-11 NOTE — Discharge Instructions (Signed)
Your elevated lithium level appears to have caused the jerking of the shoulder.  Do not take your lithium until you see your Dr.  Should have your lithium level rechecked in 2 days. After that her doctor contacted if you can restart it.   You may be somewhat dehydrated. Try to drink extra water each day. Try to eat a regular diet. Return here if needed for problems.

## 2013-08-11 NOTE — Telephone Encounter (Signed)
Pt went to the hospital for muscle jerk, doctor in the hospital told her to stop taking her Lithium medicine until Wednesday (she need to get her blood work) due to high level of lithium. Pt is not sure what to do, no appt open on Wednesday for hospital  follow up. Please advise, pt is very concern.

## 2013-08-11 NOTE — Telephone Encounter (Signed)
Pt should come in for lab only on Wednesday AM to recheck these levels - (entered in EMR) we will then call after results reviewed for further advice/instructions thanks

## 2013-08-11 NOTE — ED Notes (Signed)
Patient's daughter states that mother started having slurred speech and "jerking in her shoulders" for 2 x days.   No real slurring noticed by RN.   Daughter advised patient started on aricept in November.    Daughter states she "wonders if Lithium levels are off".

## 2013-08-11 NOTE — ED Notes (Signed)
Patient transported to CT 

## 2013-08-11 NOTE — ED Provider Notes (Addendum)
CSN: [id number]     Arrival date & time 08/11/13  0940 History   First MD Initiated Contact with Patient 08/11/13 [phone]     Chief Complaint  Patient presents with  . Aphasia   (Consider location/radiation/quality/duration/timing/severity/associated sxs/prior Treatment) HPI Comments: Stacy [name] is a 73 y.o. female who presents for evaluation of bilateral shoulder twitching. Her daughter noticed this last night and the patient states it has been present prior to that, for an unknown number of days. Her daughter feels that her speech was slurred. Last night, but now that is better, although not completely resolved. There are no other associated symptoms. There's been no fever, chills, nausea, vomiting, chest pain, shortness of breath, or abdominal pain. She's taking her usual medications. There are no other known modifying factors.  The history is provided by the patient.    Past Medical History  Diagnosis Date  . Chronic bipolar disorder   . Coronary artery disease     s/p PTCA  . Complete AV block     s/p PPM--MEDTRONIC ADAPTA ADDr01  . Takotsubo syndrome   . GERD (gastroesophageal reflux disease)   . COPD (chronic obstructive pulmonary disease)     TOBACCO ABUSE  . Abscess of liver(572.0)   . Atrial fibrillation     chronic anticoag - pradaxa  . BIPOLAR AFFECTIVE DISORDER   . DEPRESSION   . DIABETES MELLITUS, TYPE II dx 04/2010  . DYSLIPIDEMIA   . HYPERTENSION   . HYPOTHYROIDISM     hashimoto's  . TOBACCO ABUSE   . Pacemaker-Medtronic-dual-chamber   . Parathyroid related hypercalcemia   . Primary hyperparathyroidism   . Vitamin D deficiency disease   . Cancer     basal cell on abdomen   Past Surgical History  Procedure Laterality Date  . Insert / replace / remove pacemaker      MEDTRONIC ADAPTA ADDr01  . Parathyroidectomy      RIGHT INFERIOR  . Abdominal hysterectomy    . Cardiac catheterization    . Mass excision Left 01/27/2013    Procedure: EXCISION MASS LEFT  FLANK;  Surgeon: Earnstine Regal, MD;  Location: [place];  Service: General;  Laterality: Left;   Family History  Problem Relation Age of Onset  . CAD Mother   . Colon cancer      FH  . Diabetes Maternal Aunt   . CAD Father   . Diabetes type II Sister    History  Substance Use Topics  . Smoking status: Current Every Day Smoker -- 0.50 packs/day for 48 years    Types: Cigarettes  . Smokeless tobacco: Never Used     Comment: switched to vapor cigs. Widowed- 2 girls. Lives with daughter (who is nurse at [employer])  . Alcohol Use: No     Comment: "once in a blue moon" when I have [place] food   OB History   Grav Para Term Preterm Abortions TAB SAB Ect Mult Living                 Review of Systems  All other systems reviewed and are negative.    Allergies  Codeine; Latex; Metronidazole; Other; and Penicillins  Home Medications   Current Outpatient Rx  Name  Route  Sig  Dispense  Refill  . atorvastatin (LIPITOR) 80 MG tablet   Oral   Take 80 mg by mouth daily at 6 PM.          . azelastine (ASTELIN) 137 MCG/SPRAY  nasal spray   Each Nare   Place 2 sprays into both nostrils 2 (two) times daily. Use in each nostril as directed   30 mL   2   . BAYER MICROLET LANCETS lancets      Use as instructed to check blood sugar once daily dx 250.00   100 each   3   . Calcium Carbonate (CALCIUM 500 PO)   Oral   Take 1 tablet by mouth at bedtime.          . dabigatran (PRADAXA) 150 MG CAPS capsule   Oral   Take 150 mg by mouth 2 (two) times daily.         Marland Kitchen desoximetasone (TOPICORT) 0.05 % cream   Topical   Apply 1 application topically 2 (two) times daily as needed (itching).         Marland Kitchen donepezil (ARICEPT) 10 MG tablet   Oral   Take 10 mg by mouth at bedtime.         Marland Kitchen FLUoxetine (PROZAC) 20 MG capsule   Oral   Take 20 mg by mouth daily before breakfast.         . glucose blood (BAYER CONTOUR NEXT TEST) test strip      Use as instructed to check blood sugar  once daily dx 250.00   100 each   3   . levothyroxine (SYNTHROID, LEVOTHROID) 100 MCG tablet   Oral   Take 100 mcg by mouth daily before breakfast.         . lithium 300 MG capsule   Oral   Take 300 mg by mouth 2 (two) times daily with a meal.           . loratadine (CLARITIN) 10 MG tablet   Oral   Take 1 tablet (10 mg total) by mouth daily.   30 tablet   11   . losartan (COZAAR) 50 MG tablet   Oral   Take 1 tablet (50 mg total) by mouth daily before breakfast.   90 tablet   1   . meloxicam (MOBIC) 7.5 MG tablet   Oral   Take 7.5 mg by mouth daily.         . metFORMIN (GLUCOPHAGE-XR) 500 MG 24 hr tablet   Oral   Take 1 tablet (500 mg total) by mouth daily with breakfast.   90 tablet   1   . Multiple Vitamin (MULTIVITAMIN) tablet   Oral   Take 1 tablet by mouth at bedtime.          Marland Kitchen omeprazole (PRILOSEC) 40 MG capsule   Oral   Take 40 mg by mouth daily before breakfast.         . propranolol ER (INDERAL LA) 80 MG 24 hr capsule   Oral   Take 1 capsule (80 mg total) by mouth daily before breakfast.   90 capsule   3   . Propylene Glycol (SYSTANE BALANCE OP)   Ophthalmic   Apply 1 drop to eye at bedtime.         . solifenacin (VESICARE) 5 MG tablet   Oral   Take 5 mg by mouth daily.          BP 136/59  Pulse 60  Temp(Src) 97.8 F (36.6 C) (Oral)  Resp 20  Ht 5\' 6"  (1.676 m)  Wt 120 lb (54.432 kg)  BMI 19.38 kg/m2  SpO2 99% Physical Exam  Nursing note and vitals reviewed. Constitutional: She appears  well-developed.  Elderly, frail  HENT:  Head: Normocephalic and atraumatic.  Eyes: Conjunctivae and EOM are normal. Pupils are equal, round, and reactive to light.  Neck: Normal range of motion and phonation normal. Neck supple.  Cardiovascular: Normal rate, regular rhythm and intact distal pulses.   Pulmonary/Chest: Effort normal and breath sounds normal. She exhibits no tenderness.  Abdominal: Soft. She exhibits no distension. There is  no tenderness. There is no guarding.  Musculoskeletal: Normal range of motion.  Neurological: She is alert. No cranial nerve deficit. She exhibits normal muscle tone.  Nonrhythmic movements of the right arm, and right leg, that are intermittent  Skin: Skin is warm and dry.  Psychiatric: She has a normal mood and affect. Her behavior is normal.    ED Course  Procedures (including critical care time)  Medications - No data to display   Patient Vitals for the past 24 hrs:  BP Temp Temp src Pulse Resp SpO2 Height Weight  08/11/13 1345 136/59 mmHg - - 60 20 99 % - -  08/11/13 1330 124/51 mmHg - - 59 18 98 % - -  08/11/13 1315 125/56 mmHg - - 59 18 98 % - -  08/11/13 1300 - - - 61 18 100 % - -  08/11/13 1245 147/61 mmHg - - 63 19 100 % - -  08/11/13 1230 154/63 mmHg - - 62 16 99 % - -  08/11/13 1215 141/61 mmHg - - 62 22 99 % - -  08/11/13 1200 145/64 mmHg - - 59 18 100 % - -  08/11/13 1130 - 97.8 F (36.6 C) - - - - - -  08/11/13 0954 118/78 mmHg 97.1 F (36.2 C) Oral 72 20 95 % - -  08/11/13 [id number] - - - - - - 5\' 6"  (1.676 m) 120 lb (54.432 kg)   12:45- lithium level is mildly elevated, which may account for some of her symptoms, with normal renal failure, and currently normal mentation. There is no evidence for acute ingestion of lithium, so this would be a chronic elevation, with mild symptoms. There is no indication for acute dialysis treatment at this time.  I discussed the case with the on-call neurologist. Dr. Jason Nest, does not believe that the patient needs urgent treatment or admission. She can be treated with lowering her dose of Lithium, and following up with her primary care  2:00 PM Reevaluation with update and discussion. After initial assessment and treatment, an updated evaluation reveals no further c/o, no jerking now. Tobby Fawcett L   Labs Review Labs Reviewed  APTT - Abnormal; Notable for the following:    aPTT 53 (*)    All other components within normal limits   CBC - Abnormal; Notable for the following:    WBC 12.0 (*)    All other components within normal limits  DIFFERENTIAL - Abnormal; Notable for the following:    Neutro Abs 9.2 (*)    All other components within normal limits  COMPREHENSIVE METABOLIC PANEL - Abnormal; Notable for the following:    Glucose, Bld 110 (*)    ALT 40 (*)    GFR calc non Af Amer 54 (*)    GFR calc Af Amer 62 (*)    All other components within normal limits  LITHIUM LEVEL - Abnormal; Notable for the following:    Lithium Lvl 1.59 (*)    All other components within normal limits  GLUCOSE, CAPILLARY - Abnormal; Notable for the following:    Glucose-Capillary  112 (*)    All other components within normal limits  POCT I-STAT, CHEM 8 - Abnormal; Notable for the following:    Creatinine, Ser 1.20 (*)    Glucose, Bld 109 (*)    Calcium, Ion 1.43 (*)    All other components within normal limits  ETHANOL  PROTIME-INR  TROPONIN I  URINE RAPID DRUG SCREEN (HOSP PERFORMED)  URINALYSIS, ROUTINE W REFLEX MICROSCOPIC  POCT I-STAT TROPONIN I   Imaging Review Ct Head Wo Contrast  08/11/2013   CLINICAL DATA:  Aphasia  EXAM: CT HEAD WITHOUT CONTRAST  TECHNIQUE: Contiguous axial images were obtained from the base of the skull through the vertex without intravenous contrast.  COMPARISON:  CT 05/03/2012  FINDINGS: Generalized atrophy. Negative for hydrocephalus. Mild chronic microvascular ischemic change in the white matter.  Negative for acute infarct, hemorrhage, or mass lesion. No skull lesion.  IMPRESSION: No acute abnormality.   Electronically Signed   By: Franchot Gallo M.D.   On: 08/11/2013 11:14    EKG Interpretation   None       MDM   1. Jerking   2. Lithium toxicity, initial encounter      Mild lithium toxicity, need for urgent dialysis. This appears to be a low-grade chronically elevated lithium level. She is stable for discharge to outpatient management. There is no evidence for CVA, CNS, infection,  metabolic instability, or renal failure.   The patient appears reasonably screened and/or stabilized for discharge and I doubt any other medical condition or other [facility] requiring further screening, evaluation, or treatment in the ED at this time prior to discharge.  Nursing Notes Reviewed/ Care Coordinated, and agree without changes. Applicable Imaging Reviewed.  Interpretation of Laboratory Data incorporated into ED treatment   Plan: Home Medications- Hold Lithium for 2 days; Home Treatments and Observation- rest, fluids; return here if the recommended treatment, does not improve the symptoms; Recommended follow up- Repeat Lithium level and Creat. In 2 days, prior to restarting Lithium      Richarda Blade, MD 08/11/13 [place], MD 08/11/13 [place], MD 08/28/13 1736

## 2013-08-11 NOTE — ED Notes (Signed)
Pt returned from radiology.

## 2013-08-12 ENCOUNTER — Telehealth: Payer: Self-pay | Admitting: Internal Medicine

## 2013-08-12 DIAGNOSIS — R627 Adult failure to thrive: Secondary | ICD-10-CM

## 2013-08-12 DIAGNOSIS — R269 Unspecified abnormalities of gait and mobility: Secondary | ICD-10-CM

## 2013-08-12 NOTE — Telephone Encounter (Signed)
Pt went to [facility] ER yesterday for muscle spasms.  The doctors took her off of her lithium.  They told her to come on Wed. For labs to test the lithium level.  The doctor who prescribes this is Dr. Luana Shu.  Pt fell the week of Christmas.  The family needs some home assistance because the daughter is going back to school.

## 2013-08-12 NOTE — Telephone Encounter (Signed)
Notified daughter gave md response...Stacy [name]

## 2013-08-12 NOTE — Telephone Encounter (Signed)
Pt is aware to get the lab work on Wednesday.

## 2013-08-12 NOTE — Telephone Encounter (Signed)
I am aware of the ER visit, fall and high Lithium levels We will let Dr Luana Shu know results of recheck labs on Wed Will order [facility] will arrange

## 2013-08-13 ENCOUNTER — Other Ambulatory Visit (INDEPENDENT_AMBULATORY_CARE_PROVIDER_SITE_OTHER): Payer: Medicare HMO

## 2013-08-13 DIAGNOSIS — Z79899 Other long term (current) drug therapy: Secondary | ICD-10-CM

## 2013-08-13 LAB — BASIC METABOLIC PANEL
BUN: 15 mg/dL (ref 6–23)
CHLORIDE: 104 meq/L (ref 96–112)
CO2: 28 mEq/L (ref 19–32)
CREATININE: 1.1 mg/dL (ref 0.4–1.2)
Calcium: 10.3 mg/dL (ref 8.4–10.5)
GFR: 51.78 mL/min — ABNORMAL LOW (ref >60.00)
Glucose, Bld: 134 mg/dL — ABNORMAL HIGH (ref 70–99)
Potassium: 4.9 mEq/L (ref 3.5–5.1)
SODIUM: 138 meq/L (ref 135–145)

## 2013-08-14 LAB — LITHIUM LEVEL: LITHIUM LVL: 0.9 meq/L (ref 0.80–1.40)

## 2013-08-15 ENCOUNTER — Telehealth: Payer: Self-pay | Admitting: *Deleted

## 2013-08-15 ENCOUNTER — Encounter: Payer: Self-pay | Admitting: Internal Medicine

## 2013-08-15 ENCOUNTER — Ambulatory Visit (INDEPENDENT_AMBULATORY_CARE_PROVIDER_SITE_OTHER): Payer: Medicare HMO | Admitting: Internal Medicine

## 2013-08-15 VITALS — BP 128/82 | HR 81 | Temp 97.8°F | Wt 120.8 lb

## 2013-08-15 DIAGNOSIS — R259 Unspecified abnormal involuntary movements: Secondary | ICD-10-CM

## 2013-08-15 DIAGNOSIS — F319 Bipolar disorder, unspecified: Secondary | ICD-10-CM

## 2013-08-15 DIAGNOSIS — T6591XS Toxic effect of unspecified substance, accidental (unintentional), sequela: Secondary | ICD-10-CM

## 2013-08-15 NOTE — Telephone Encounter (Signed)
Verbal ok?

## 2013-08-15 NOTE — Telephone Encounter (Signed)
(  could not understand female's name r/t accent) [facility]  Requesting orders for occupational and physical therapy  CB# [phone]

## 2013-08-15 NOTE — Assessment & Plan Note (Signed)
Follows with behavioral health psychiatry for same Toxic level of lithium late December 2014 reviewed, medications been on hold since that time Repeat labs normalized Advise followup with psychiatry for further medication management of same Remains on Prozac, no new changes recommended by me today

## 2013-08-15 NOTE — Telephone Encounter (Signed)
Dtr, [name], called in regards to today's visit and instructions.  Referred dtr to patient's AVS which she has, but has not read yet. She needs clarification on when pt should resume lithium.  Have spent approximately 20 minutes reviewing AVS with dtr and referring her to AVS.  Please advise.

## 2013-08-15 NOTE — Progress Notes (Signed)
Pre-visit discussion using our clinic review tool. No additional management support is needed unless otherwise documented below in the visit note.  

## 2013-08-15 NOTE — Patient Instructions (Signed)
It was good to see you today.  We have reviewed your prior records including labs and tests today  Medications reviewed and updated -no changes recommended today  We will help arrange followup with [name] for management of your bipolar medications as discussed  Continue working with your other specialists for thyroid and diabetes and tremor and mood as reviewed  Also continue working with home health therapy for your balance and strength  Keep followup in 3 months for review, please call sooner if problems  Fall Prevention and Home Safety Falls cause injuries and can affect all age groups. It is possible to use preventive measures to significantly decrease the likelihood of falls. There are many simple measures which can make your home safer and prevent falls. OUTDOORS  Repair cracks and edges of walkways and driveways.  Remove high doorway thresholds.  Trim shrubbery on the main path into your home.  Have good outside lighting.  Clear walkways of tools, rocks, debris, and clutter.  Check that handrails are not broken and are securely fastened. Both sides of steps should have handrails.  Have leaves, snow, and ice cleared regularly.  Use sand or salt on walkways during winter months.  In the garage, clean up grease or oil spills. BATHROOM  Install night lights.  Install grab bars by the toilet and in the tub and shower.  Use non-skid mats or decals in the tub or shower.  Place a plastic non-slip stool in the shower to sit on, if needed.  Keep floors dry and clean up all water on the floor immediately.  Remove soap buildup in the tub or shower on a regular basis.  Secure bath mats with non-slip, double-sided rug tape.  Remove throw rugs and tripping hazards from the floors. BEDROOMS  Install night lights.  Make sure a bedside light is easy to reach.  Do not use oversized bedding.  Keep a telephone by your bedside.  Have a firm chair with side arms to use  for getting dressed.  Remove throw rugs and tripping hazards from the floor. KITCHEN  Keep handles on pots and pans turned toward the center of the stove. Use back burners when possible.  Clean up spills quickly and allow time for drying.  Avoid walking on wet floors.  Avoid hot utensils and knives.  Position shelves so they are not too high or low.  Place commonly used objects within easy reach.  If necessary, use a sturdy step stool with a grab bar when reaching.  Keep electrical cables out of the way.  Do not use floor polish or wax that makes floors slippery. If you must use wax, use non-skid floor wax.  Remove throw rugs and tripping hazards from the floor. STAIRWAYS  Never leave objects on stairs.  Place handrails on both sides of stairways and use them. Fix any loose handrails. Make sure handrails on both sides of the stairways are as long as the stairs.  Check carpeting to make sure it is firmly attached along stairs. Make repairs to worn or loose carpet promptly.  Avoid placing throw rugs at the top or bottom of stairways, or properly secure the rug with carpet tape to prevent slippage. Get rid of throw rugs, if possible.  Have an electrician put in a light switch at the top and bottom of the stairs. OTHER FALL PREVENTION TIPS  Wear low-heel or rubber-soled shoes that are supportive and fit well. Wear closed toe shoes.  When using a stepladder, make sure  it is fully opened and both spreaders are firmly locked. Do not climb a closed stepladder.  Add color or contrast paint or tape to grab bars and handrails in your home. Place contrasting color strips on first and last steps.  Learn and use mobility aids as needed. Install an electrical emergency response system.  Turn on lights to avoid dark areas. Replace light bulbs that burn out immediately. Get light switches that glow.  Arrange furniture to create clear pathways. Keep furniture in the same place.  Firmly  attach carpet with non-skid or double-sided tape.  Eliminate uneven floor surfaces.  Select a carpet pattern that does not visually hide the edge of steps.  Be aware of all pets. OTHER HOME SAFETY TIPS  Set the water temperature for 120 F (48.8 C).  Keep emergency numbers on or near the telephone.  Keep smoke detectors on every level of the home and near sleeping areas. Document Released: 07/14/2002 Document Revised: 01/23/2012 Document Reviewed: 10/13/2011 [facility] Patient Information 2014 [place].

## 2013-08-15 NOTE — Telephone Encounter (Signed)
Called daughter back inform her that md saw pt today gave her a copy of labs that was done on Wed. Daughter states she did see the labs but didn't know if mom should start back taking the lithum. Inform her that she need to contact pt psychiatrist and get advisement for her because Dr. Asa Lente doesn't handle her lithium. Per md note she did states she could resume back since level was normal, but daughter states she don't think she will start back because she feel like the med is making her dizzy & that's why she fell. Again advise pt to call psychiatrist to get her advisement on lithum...[name]

## 2013-08-15 NOTE — Assessment & Plan Note (Signed)
Prior neuro eval 2011 felt related to Lithium use (consult reviewed) Now working with Dr Tat for same - medications reviewed -  on propanolol , consider increase in dose as tolerated by blood pressure, but no medication changes recommended today Fall mid December 2014, suspect related to toxic lithium levels Continue home health PT as ongoing for L hip bursitis (per ortho/sport med refer from [facility]) continue present plan and medications.

## 2013-08-15 NOTE — Progress Notes (Signed)
Subjective:    Patient ID: Stacy [name], female    DOB: [birth year], 73 y.o.   MRN: [id number]  HPI  Here for ER followup - toxic Li level Since home,   Past Medical History  Diagnosis Date  . Chronic bipolar disorder   . Coronary artery disease     s/p PTCA  . Complete AV block     s/p PPM--MEDTRONIC ADAPTA ADDr01  . Takotsubo syndrome   . GERD (gastroesophageal reflux disease)   . COPD (chronic obstructive pulmonary disease)     TOBACCO ABUSE  . Abscess of liver(572.0)   . Atrial fibrillation     chronic anticoag - pradaxa  . BIPOLAR AFFECTIVE DISORDER   . DEPRESSION   . DIABETES MELLITUS, TYPE II dx 04/2010  . DYSLIPIDEMIA   . HYPERTENSION   . HYPOTHYROIDISM     hashimoto's  . TOBACCO ABUSE   . Pacemaker-Medtronic-dual-chamber   . Parathyroid related hypercalcemia   . Primary hyperparathyroidism   . Vitamin D deficiency disease   . Cancer     basal cell on abdomen    Review of Systems  Constitutional: Negative for fever, fatigue and unexpected weight change.  Neurological: Positive for tremors. Negative for weakness.       Objective:   Physical Exam BP 128/82  Pulse 81  Temp(Src) 97.8 F (36.6 C) (Oral)  Wt 120 lb 12.8 oz (54.795 kg)  SpO2 96% Wt Readings from Last 3 Encounters:  08/15/13 120 lb 12.8 oz (54.795 kg)  08/11/13 120 lb (54.432 kg)  07/24/13 122 lb 12.8 oz (55.702 kg)   Constitutional: She appears well-developed and well-nourished. No distress.  Neck: Normal range of motion. Neck supple. No JVD present. No thyromegaly present.  Cardiovascular: Normal rate, regular rhythm and normal heart sounds.  No murmur heard. No BLE edema. Pulmonary/Chest: Effort normal and breath sounds normal. No respiratory distress. She has no wheezes.  Neurological: resting tremor hands greater than head and neck. She is alert and oriented to person, place, and time. No cranial nerve deficit. Coordination, strength, speech are normal. observe gait and balance  are slow but normal, unaided Skin: Bruising left-side of lower chest wall and left forearm/elbow. No joint effusions. Non-tender to bony palpation. Full range of motion. No laceration or open skin -Skin is warm and dry. No rash noted. No erythema.  Psychiatric: She has a normal mood and affect. Her behavior is normal. Judgment and thought content normal.   Lab Results  Component Value Date   WBC 12.0* 08/11/2013   HGB 14.6 08/11/2013   HCT 43.0 08/11/2013   PLT 208 08/11/2013   GLUCOSE 134* 08/13/2013   CHOL 93 01/30/2013   TRIG 61.0 01/30/2013   HDL 56.60 01/30/2013   LDLDIRECT 77.5 08/11/2009   LDLCALC 24 01/30/2013   ALT 40* 08/11/2013   AST 27 08/11/2013   NA 138 08/13/2013   K 4.9 08/13/2013   CL 104 08/13/2013   CREATININE 1.1 08/13/2013   BUN 15 08/13/2013   CO2 28 08/13/2013   TSH 3.575 05/05/2013   INR 1.23 08/11/2013   HGBA1C 5.4 05/08/2013       Assessment & Plan:   Fall with Li toxicity - ER visit for same reviewed No injury, bruises resolving  Recheck Li levels this week have normalized, but pt concerned about resuming same medication  Time spent with pt today 25 minutes, greater than 50% time spent counseling patient on tremor, fall last week, runny nose and medication review.  Also review of prior records and letter from dtr Renee (scanned into EMR)

## 2013-08-16 NOTE — Telephone Encounter (Signed)
Called Arnold back no answer LMOM with md response...lmb

## 2013-08-20 DIAGNOSIS — M76899 Other specified enthesopathies of unspecified lower limb, excluding foot: Secondary | ICD-10-CM

## 2013-08-20 DIAGNOSIS — E119 Type 2 diabetes mellitus without complications: Secondary | ICD-10-CM

## 2013-08-20 DIAGNOSIS — R259 Unspecified abnormal involuntary movements: Secondary | ICD-10-CM

## 2013-08-20 DIAGNOSIS — R262 Difficulty in walking, not elsewhere classified: Secondary | ICD-10-CM

## 2013-08-21 ENCOUNTER — Telehealth: Payer: Self-pay | Admitting: *Deleted

## 2013-08-21 DIAGNOSIS — R259 Unspecified abnormal involuntary movements: Secondary | ICD-10-CM

## 2013-08-21 DIAGNOSIS — R627 Adult failure to thrive: Secondary | ICD-10-CM

## 2013-08-21 NOTE — Telephone Encounter (Signed)
Ok to continue [facility] and decline OP rehab

## 2013-08-21 NOTE — Telephone Encounter (Signed)
Patient phoned stating that she had received a phone call from [facility] to initiate services & she states she already has rehab services through [facility].  Please advise.   CB# [phone]

## 2013-08-22 NOTE — Telephone Encounter (Signed)
Patient notified of MD response.  Stated her [facility] OT told her yesterday (after she spoke with me initially) and informed her that yesterday was probably home health's last visit.  Informed patient if [facility] d/c'ed her, and she still needed occupational therapy, to call us back. When [facility] phoned her, she told them she was already being seen by [facility] & they in turn, cancelled their orders with them.

## 2013-08-22 NOTE — Telephone Encounter (Signed)
Pt needs to determine which type of therapy she would prefer.  I will (re)order one or the other.  I recommend outpatient physical therapy if patient has no preference

## 2013-08-27 ENCOUNTER — Telehealth: Payer: Self-pay | Admitting: Internal Medicine

## 2013-08-27 NOTE — Telephone Encounter (Signed)
From Melissa at [facility] .We would be happy to help with this pt but wanted to make sure you were ok with our current situation in [place].  We can go ahead and get nursing and the aide started right away but therapy will be on a bit of a delay as all of our therapists have heavy pt loads right now. Would Dr. Asa Lente be okay with that? It will probably be 1-2 weeks before therapy can get out there.

## 2013-08-27 NOTE — Telephone Encounter (Signed)
[place] with me if ok with family

## 2013-09-02 ENCOUNTER — Telehealth: Payer: Self-pay | Admitting: Internal Medicine

## 2013-09-02 NOTE — Telephone Encounter (Signed)
[name], NP at Dr. Arvil Persons office called and left a VM on the triage line asking for a call back to discuss the patient's medications since she was recently in the hospital. Please call back at [phone].

## 2013-09-02 NOTE — Telephone Encounter (Signed)
Caren Griffins from [facility] called and left a VM on the triage line to notify the office that the patient is being discharged today. They need to know if the patient has had a recent PT INR at [facility] and if she has not they are instructed to schedule her for a PT INR check prior to discharging. Please call Caren Griffins at [phone] or she says we can call patient directly. Please advise.

## 2013-09-03 NOTE — Telephone Encounter (Signed)
1) no need for HH to draw PT/INR as she is not on coumadin 2) I will call NP later - or if she calls again today while I am in clinic, please let me know and I will speak with her then

## 2013-09-03 NOTE — Telephone Encounter (Signed)
Called Stacy [name] bck with [name] with md response...[name]

## 2013-09-04 ENCOUNTER — Other Ambulatory Visit: Payer: Self-pay | Admitting: *Deleted

## 2013-09-04 MED ORDER — METFORMIN HCL ER 500 MG PO TB24: 500.0000 mg | ORAL_TABLET | Freq: Every day | ORAL | Status: DC
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
## 2013-09-10 ENCOUNTER — Ambulatory Visit (INDEPENDENT_AMBULATORY_CARE_PROVIDER_SITE_OTHER): Payer: Commercial Managed Care - HMO | Admitting: *Deleted

## 2013-09-10 DIAGNOSIS — I442 Atrioventricular block, complete: Secondary | ICD-10-CM

## 2013-09-10 DIAGNOSIS — I4891 Unspecified atrial fibrillation: Secondary | ICD-10-CM

## 2013-09-10 LAB — MDC_IDC_ENUM_SESS_TYPE_REMOTE
Brady Statistic AP VS Percent: 75.2 %
Brady Statistic AS VS Percent: 0.5 %
Lead Channel Impedance Value: 1067 Ohm
Lead Channel Impedance Value: 521 Ohm
Lead Channel Pacing Threshold Amplitude: 0.625 V
Lead Channel Pacing Threshold Amplitude: 1.125 V
Lead Channel Pacing Threshold Pulse Width: 0.4 ms
Lead Channel Pacing Threshold Pulse Width: 0.4 ms
Lead Channel Setting Pacing Amplitude: 2.5 V
Lead Channel Setting Pacing Pulse Width: 0.4 ms
MDC IDC MSMT LEADCHNL RV SENSING INTR AMPL: 22.4 mV
MDC IDC SET LEADCHNL RA PACING AMPLITUDE: 2 V
MDC IDC SET LEADCHNL RV SENSING SENSITIVITY: 5.6 mV
MDC IDC STAT BRADY AP VP PERCENT: 24.2 %
MDC IDC STAT BRADY AS VP PERCENT: 0.1 %

## 2013-09-10 MED ORDER — OMEPRAZOLE 40 MG PO CPDR: 40.0000 mg | DELAYED_RELEASE_CAPSULE | Freq: Every day | ORAL | Status: DC

## 2013-09-10 NOTE — Telephone Encounter (Signed)
HHPT ordered as requested

## 2013-09-10 NOTE — Telephone Encounter (Signed)
Patient last saw PCP 08/15/13.  Omeprazole refilled per protocol.

## 2013-09-10 NOTE — Telephone Encounter (Signed)
Patient phoned in clarifying she prefers [facility], but she also needs a refill on her omeprazole.

## 2013-09-11 ENCOUNTER — Telehealth: Payer: Self-pay | Admitting: Internal Medicine

## 2013-09-11 ENCOUNTER — Other Ambulatory Visit: Payer: Self-pay | Admitting: *Deleted

## 2013-09-11 MED ORDER — METFORMIN HCL ER 500 MG PO TB24: 500.0000 mg | ORAL_TABLET | Freq: Every day | ORAL | Status: DC

## 2013-09-11 NOTE — Telephone Encounter (Signed)
During conversation, patient stated she had not received her metformin from [facility].  According to [facility], it was submitted on 09/04/13 to [facility] for refill.  Advised patient that if it didn't come today (she will take her last available dose this morning), to call [facility] & find out when it was shipped and call me back & I will call in emergency supply to her local pharmacy.

## 2013-09-11 NOTE — Telephone Encounter (Signed)
Called [name] back no answer LMOM RTC...Stacy [name]

## 2013-09-11 NOTE — Telephone Encounter (Signed)
Notified patient of HHPT order placed.

## 2013-09-11 NOTE — Telephone Encounter (Signed)
Patient's mail order pharm didn't send metformin supply until today.  Emergent supply of 14 tabs sent to patient's local pharmacy.  Notified patient via voicemail of script sent to pharmacy.

## 2013-09-11 NOTE — Telephone Encounter (Signed)
[name], NP at Dr[facility] Ar[facility] is calling requesting to speak with someone about labs they received back on the patient. Please call her back at [phone] to discuss.

## 2013-09-12 NOTE — Telephone Encounter (Signed)
Called [name] back to follow-up still no answer closing encounter...Stacy [name]

## 2013-09-16 ENCOUNTER — Ambulatory Visit (INDEPENDENT_AMBULATORY_CARE_PROVIDER_SITE_OTHER): Payer: Medicare HMO | Admitting: Neurology

## 2013-09-16 ENCOUNTER — Encounter: Payer: Self-pay | Admitting: Neurology

## 2013-09-16 VITALS — BP 136/78 | HR 80 | Resp 16 | Ht 66.0 in | Wt 123.4 lb

## 2013-09-16 DIAGNOSIS — R259 Unspecified abnormal involuntary movements: Secondary | ICD-10-CM

## 2013-09-16 DIAGNOSIS — F319 Bipolar disorder, unspecified: Secondary | ICD-10-CM

## 2013-09-16 DIAGNOSIS — R413 Other amnesia: Secondary | ICD-10-CM

## 2013-09-16 MED ORDER — PROPRANOLOL HCL ER 60 MG PO CP24: 60.0000 mg | ORAL_CAPSULE | Freq: Every day | ORAL | Status: DC

## 2013-09-16 NOTE — Patient Instructions (Addendum)
1. Decrease propranolol to 60 mg daily 2. Follow up in 3 months or sooner if needed

## 2013-09-16 NOTE — Progress Notes (Signed)
Dear Dr. Asa Lente,  I saw  Stacy [name] back in the movement disorder clinic clinic for her problem with essential/pharmacologic induced tremor.   She previously saw Dr. Jacelyn Grip.  I reviewed his notes and will be taking over the patients care. As you may recall, she is a 73 y.o. year old female with a history of bipolar disorder, complete heart block s/p pacemaker placement, left hemispheric ischemic stroke, atrial fibrillation and tremor.  The patient reports tremor for years (at few years ago).  She is R hand dominant but the tremor is in both hands equally.  She reports tremor only when she goes to grab something.  She states that it does not interfere with her eating.  She rarely speills liquid.  She is on lithium and has been on that for several years (lithium started first, then tremor later).   Dr Jacelyn Grip addressed this as a cause or exacerbator of tremor but her psychiatrist felt that she was unable to come off of it from a bipolar standpoint.  She has tried to decrease caffeine without benefit.   She drinks 2 regular size cups of coffee per day.    .  There is no fam hx of tremor.  She has no SE with the propranolol.  01/16/2013:  Last visit, her propranolol was increased to 60 mg daily.  She is not sure that this really has been particularly helpful for the tremor, but states that it may have improved slightly.  She denies any side effects.  She has not had any syncopal episodes.  She denies daytime fatigue.  She continues to follow with psychiatry for bipolar disorder.  She does remain on Pradaxa are because of history of stroke.  She denies any falls.  She denies any hematochezia or melena.  She has not had any abnormal bleeding.  06/20/13:  The pt reports that her tremor is better with the increased dose of propranolol, 80 mg daily.  She reports no side effects.  She denies dizziness; her daughter writes me a long note and in it she states that pt is dizzy.  The pt adamently denies this.  Her  daughter also states that memory is a big issue and the pt agrees that memory is not as good as it used to be.  Her daughter lives with her.  The pt drives without a problem.  She cooks minimally.  She goes out for most meals.  She does the finances in the home.  She does state that she was supposed to be here in sept but she didn't have the copay and had to cancel.  She did not forget about the appointment.  There is a large amount of stress in the home between the pt and her daughter, who lives with the pt.   09/16/13 update:  The pt is on propranolol 80 mg daily for lithium induced tremor.  Last visit, aricept was started for memory changes.  She is tolerating the medication well without SE. She went to the ED 1/5.  The records that were made available to me were reviewed.  She was twitching and had ?slurred speech the prior evening.  She was dx with lithium toxicity.   When she f/u with Dr. Basilio Cairo, her lithium levels were normal again.  She has been off of lithium since the ER visit.  She is f/u with her psychiatrist (Dr. Rodney Cruise) in 2 weeks.  Tremor is markedly improved.  She does c/o bad dreams and wonders if  the aricept is playing a role.  She hasn't had the dreams in the last few nights.  No falls.     Current Outpatient Prescriptions on File Prior to Visit  Medication Sig Dispense Refill  . atorvastatin (LIPITOR) 80 MG tablet Take 80 mg by mouth daily at 6 PM.       . azelastine (ASTELIN) 137 MCG/SPRAY nasal spray Place 2 sprays into both nostrils 2 (two) times daily. Use in each nostril as directed  30 mL  2  . BAYER MICROLET LANCETS lancets Use as instructed to check blood sugar once daily dx 250.00  100 each  3  . Calcium Carbonate (CALCIUM 500 PO) Take 1 tablet by mouth at bedtime.       . dabigatran (PRADAXA) 150 MG CAPS capsule Take 150 mg by mouth 2 (two) times daily.      Marland Kitchen desoximetasone (TOPICORT) 0.05 % cream Apply 1 application topically 2 (two) times daily as needed (itching).       Marland Kitchen donepezil (ARICEPT) 10 MG tablet Take 10 mg by mouth at bedtime.      Marland Kitchen FLUoxetine (PROZAC) 20 MG capsule Take 20 mg by mouth daily before breakfast.      . glucose blood (BAYER CONTOUR NEXT TEST) test strip Use as instructed to check blood sugar once daily dx 250.00  100 each  3  . levothyroxine (SYNTHROID, LEVOTHROID) 100 MCG tablet Take 100 mcg by mouth daily before breakfast.      . loratadine (CLARITIN) 10 MG tablet Take 1 tablet (10 mg total) by mouth daily.  30 tablet  11  . losartan (COZAAR) 50 MG tablet Take 1 tablet (50 mg total) by mouth daily before breakfast.  90 tablet  1  . meloxicam (MOBIC) 7.5 MG tablet Take 7.5 mg by mouth daily.      . metFORMIN (GLUCOPHAGE-XR) 500 MG 24 hr tablet Take 1 tablet (500 mg total) by mouth daily with breakfast.  14 tablet  0  . Multiple Vitamin (MULTIVITAMIN) tablet Take 1 tablet by mouth at bedtime.       Marland Kitchen omeprazole (PRILOSEC) 40 MG capsule Take 1 capsule (40 mg total) by mouth daily before breakfast.  90 capsule  3  . propranolol ER (INDERAL LA) 80 MG 24 hr capsule Take 1 capsule (80 mg total) by mouth daily before breakfast.  90 capsule  3  . Propylene Glycol (SYSTANE BALANCE OP) Apply 1 drop to eye at bedtime.      . solifenacin (VESICARE) 5 MG tablet Take 5 mg by mouth daily.       No current facility-administered medications on file prior to visit.    Allergies  Allergen Reactions  . Codeine     REACTION: vomiting  . Latex Other (See Comments)    irritation  . Metronidazole Other (See Comments)    Reaction unknown  . Other     Bandaids cause rash  . Penicillins     REACTION: throat swelling, rash   Past Medical History  Diagnosis Date  . Chronic bipolar disorder   . Coronary artery disease     s/p PTCA  . Complete AV block     s/p PPM--MEDTRONIC ADAPTA ADDr01  . Takotsubo syndrome   . GERD (gastroesophageal reflux disease)   . COPD (chronic obstructive pulmonary disease)     TOBACCO ABUSE  . Abscess of liver(572.0)    . Atrial fibrillation     chronic anticoag - pradaxa  . BIPOLAR AFFECTIVE DISORDER   .  DEPRESSION   . DIABETES MELLITUS, TYPE II dx 04/2010  . DYSLIPIDEMIA   . HYPERTENSION   . HYPOTHYROIDISM     hashimoto's  . TOBACCO ABUSE   . Pacemaker-Medtronic-dual-chamber   . Parathyroid related hypercalcemia   . Primary hyperparathyroidism   . Vitamin D deficiency disease   . Cancer     basal cell on abdomen   Past Surgical History  Procedure Laterality Date  . Insert / replace / remove pacemaker      MEDTRONIC ADAPTA ADDr01  . Parathyroidectomy      RIGHT INFERIOR  . Abdominal hysterectomy    . Cardiac catheterization    . Mass excision Left 01/27/2013    Procedure: EXCISION MASS LEFT FLANK;  Surgeon: Earnstine Regal, MD;  Location: [facility];  Service: General;  Laterality: Left;   History   Social History  . Marital Status: Single    Spouse Name: N/A    Number of Children: 2  . Years of Education: N/A   Occupational History  .     Social History Main Topics  . Smoking status: Current Every Day Smoker -- 0.50 packs/day for 48 years    Types: Cigarettes  . Smokeless tobacco: Never Used     Comment: switched to vapor cigs. Widowed- 2 girls. Lives with daughter (who is nurse at [employer])  . Alcohol Use: No     Comment: "once in a blue moon" when I have [place] food  . Drug Use: No  . Sexual Activity: Not on file   Other Topics Concern  . Not on file   Social History Narrative   WIDOWED   2 DAUGHTERS   LIVES W/DAUGHTER   CURRENTLY SMOKES   NO ALCOHOL USE   NO ILLICIT DRUG USE   DAILY CAFFEINE USE      PPM-MEDTRONIC   PATIENT SIGNED A DESIGNATED PARTY RELEASE TO ALLOW DAUGHTER, NICOLE COX, TO HAVE ACCESS TO HER MEDICAL RECORDS/INFORMATION. LATOYA BATTLE, November 09, 2009 9:19 AM     ROS:  Occasional diarrhea.  10 systems were reviewed and  are unremarkable except as reported above.  Exam: . Filed Vitals:   09/16/13 0859  BP: 136/78  Pulse: 80  Resp: 16  Height:  5\' 6"  (1.676 m)  Weight: 123 lb 7 oz (55.991 kg)    Gen:  Appears stated age and in NAD. HEENT:  Normocephalic, atraumatic. The mucous membranes are moist. The superficial temporal arteries are without ropiness or tenderness. Cardiovascular: Regular rate and rhythm. Lungs: Clear to auscultation bilaterally. Neck: There are no carotid bruits noted bilaterally.  NEUROLOGICAL:  Orientation:  A MoCA was done last visit and the pt scored 15/30.  She is A and O x 3 today. Cranial nerves: There is good facial symmetry. The pupils are equal round and reactive to light bilaterally. Fundoscopic exam reveals clear disc margins bilaterally. Extraocular muscles are intact and visual fields are full to confrontational testing. Speech is fluent and clear. Soft palate rises symmetrically and there is no tongue deviation. Hearing is intact to conversational tone. Tone: Tone is good throughout. Sensation: Sensation is intact to light touch and pinprick throughout (facial, trunk, extremities). Vibration is intact at the bilateral big toe. There is no extinction with double simultaneous stimulation. There is no sensory dermatomal level identified. Coordination:  The patient has no difficulty with RAM's or FNF bilaterally. Motor: Strength is 5/5 in the bilateral upper and lower extremities.  Shoulder shrug is equal bilaterally.  There is  no pronator drift.  There are no fasciculations noted. DTR's: Deep tendon reflexes are 3/4 at the bilateral biceps, triceps, brachioradialis, patella and 1/4 at the bilateral achilles.  Plantar responses are downgoing bilaterally. Gait and Station: The patient is able to ambulate without difficulty.   Movement examination:  There is almost no postural tremor.  No trouble pouring water from one glass to another.  No significant trouble with archimedes spirals.    LABS:  Lab Results  Component Value Date   WBC 12.0* 08/11/2013   HGB 14.6 08/11/2013   HCT 43.0 08/11/2013   MCV 97.2  08/11/2013   PLT 208 08/11/2013     Chemistry      Component Value Date/Time   NA 138 08/13/2013 1016   K 4.9 08/13/2013 1016   CL 104 08/13/2013 1016   CO2 28 08/13/2013 1016   BUN 15 08/13/2013 1016   CREATININE 1.1 08/13/2013 1016      Component Value Date/Time   CALCIUM 10.3 08/13/2013 1016   CALCIUM 10.4 10/25/2011 1405   ALKPHOS 93 08/11/2013 1045   AST 27 08/11/2013 1045   ALT 40* 08/11/2013 1045   BILITOT 0.3 08/11/2013 1045     Lab Results  Component Value Date   TSH 3.575 05/05/2013   Lab Results  Component Value Date   LITHIUM 0.90 08/13/2013   NA 138 08/13/2013   BUN 15 08/13/2013   CREATININE 1.1 08/13/2013   TSH 3.575 05/05/2013   WBC 12.0* 08/11/2013    No results found for this basename: VITAMINB12    Impression/Recommendations: 1.  Tremor -   -This is markedly improved off of lithium.  I'm going to decrease propranolol LA to 60 mg daily and told her to keep an eye on the BP.  I would like to potentially try and get off of this medication. 2.  Memory Change  -I think that this is likely multifactorial.  I do think that medication and depression play a role.  I also think that she potentially has the onset of a neurodegenerative d/o such as AD.  I spoke to her in detail about these things today.  I offered neuropsych testing but she decided to hold on that and continue on the aricept 10 mg daily.  I told her that I haven't had pts with bad dreams on this but we could try and d/c but she said that she wasn't sure it was the aricept and was doing okay for now.  Repeat MoCA next visit.   3.  History of stroke - CTA head and neck unremarkable.  W/U complete, presumed cardioembolic, patient now on pradaxa. 4.  I am going to plan on seeing the patient back in the next-3 months.

## 2013-09-19 ENCOUNTER — Ambulatory Visit: Payer: Medicare HMO | Admitting: Neurology

## 2013-10-01 ENCOUNTER — Encounter: Payer: Self-pay | Admitting: *Deleted

## 2013-10-21 ENCOUNTER — Encounter: Payer: Self-pay | Admitting: Internal Medicine

## 2013-11-05 ENCOUNTER — Ambulatory Visit (INDEPENDENT_AMBULATORY_CARE_PROVIDER_SITE_OTHER): Payer: Medicare HMO | Admitting: Internal Medicine

## 2013-11-05 ENCOUNTER — Encounter: Payer: Self-pay | Admitting: Internal Medicine

## 2013-11-05 ENCOUNTER — Other Ambulatory Visit (INDEPENDENT_AMBULATORY_CARE_PROVIDER_SITE_OTHER): Payer: Medicare HMO

## 2013-11-05 ENCOUNTER — Encounter: Payer: Medicare HMO | Admitting: Internal Medicine

## 2013-11-05 VITALS — BP 140/82 | HR 77 | Temp 97.9°F | Wt 126.4 lb

## 2013-11-05 DIAGNOSIS — E119 Type 2 diabetes mellitus without complications: Secondary | ICD-10-CM

## 2013-11-05 DIAGNOSIS — E039 Hypothyroidism, unspecified: Secondary | ICD-10-CM

## 2013-11-05 DIAGNOSIS — R350 Frequency of micturition: Secondary | ICD-10-CM

## 2013-11-05 DIAGNOSIS — I4891 Unspecified atrial fibrillation: Secondary | ICD-10-CM

## 2013-11-05 DIAGNOSIS — F319 Bipolar disorder, unspecified: Secondary | ICD-10-CM

## 2013-11-05 LAB — LIPID PANEL
CHOLESTEROL: 117 mg/dL (ref 0–200)
HDL: 63.5 mg/dL (ref >39.00)
LDL Cholesterol: 31 mg/dL (ref 0–99)
TRIGLYCERIDES: 112 mg/dL (ref 0.0–149.0)
Total CHOL/HDL Ratio: 2
VLDL: 22.4 mg/dL (ref 0.0–40.0)

## 2013-11-05 LAB — TSH: TSH: 4.84 u[IU]/mL (ref 0.35–5.50)

## 2013-11-05 LAB — POCT URINALYSIS DIPSTICK
BILIRUBIN UA: NEGATIVE
Glucose, UA: NEGATIVE
KETONES UA: NEGATIVE
Nitrite, UA: NEGATIVE
Protein, UA: NEGATIVE
Urobilinogen, UA: 0.2
pH, UA: 5

## 2013-11-05 LAB — HEMOGLOBIN A1C: HEMOGLOBIN A1C: 6.1 % (ref 4.6–6.5)

## 2013-11-05 MED ORDER — SULFAMETHOXAZOLE-TRIMETHOPRIM 800-160 MG PO TABS: 1.0000 | ORAL_TABLET | Freq: Two times a day (BID) | ORAL | Status: DC

## 2013-11-05 NOTE — Assessment & Plan Note (Signed)
Rate controlled, asymptomatic  anticoag with pradaxa -  continue to review risk vs benefit of same (reviewed accidental fall 07/2013 when brusied L side chest and arm but no other injury) Follows with cards annually and prn

## 2013-11-05 NOTE — Progress Notes (Signed)
Pre visit review using our clinic review tool, if applicable. No additional management support is needed unless otherwise documented below in the visit note. 

## 2013-11-05 NOTE — Assessment & Plan Note (Signed)
History of thyroiditis, on hormone replacement therapy check every 6 months and adjust tx as needed Lab Results  Component Value Date   TSH 3.575 05/05/2013

## 2013-11-05 NOTE — Patient Instructions (Signed)
It was good to see you today.  We have reviewed your prior records including labs and tests today  Test(s) ordered today. Your results will be released to MyChart (or called to you) after review, usually within 72hours after test completion. If any changes need to be made, you will be notified at that same time.  Medications reviewed and updated, no changes recommended at this time. Refill on medication(s) as discussed today.  Please schedule followup in 6 months, call sooner if problems.  

## 2013-11-05 NOTE — Progress Notes (Signed)
Subjective:    Patient ID: Stacy [name], female    DOB: Mar 13, 1941, 73 y.o.   MRN: [id number]  HPI  Patient is here for follow up  Reviewed chronic medical issues and interval medical events  Past Medical History  Diagnosis Date  . Chronic bipolar disorder   . Coronary artery disease     s/p PTCA  . Complete AV block     s/p PPM--MEDTRONIC ADAPTA ADDr01  . Takotsubo syndrome   . GERD (gastroesophageal reflux disease)   . COPD (chronic obstructive pulmonary disease)     TOBACCO ABUSE  . Abscess of liver(572.0)   . Atrial fibrillation     chronic anticoag - pradaxa  . BIPOLAR AFFECTIVE DISORDER   . DEPRESSION   . DIABETES MELLITUS, TYPE II dx 04/2010  . DYSLIPIDEMIA   . HYPERTENSION   . HYPOTHYROIDISM     hashimoto's  . TOBACCO ABUSE   . Pacemaker-Medtronic-dual-chamber   . Parathyroid related hypercalcemia   . Primary hyperparathyroidism   . Vitamin D deficiency disease   . Cancer     basal cell on abdomen    Review of Systems  Constitutional: Negative for fever, fatigue and unexpected weight change.  Respiratory: Negative for cough and shortness of breath.   Cardiovascular: Negative for chest pain, palpitations and leg swelling.  Genitourinary: Positive for dysuria, urgency and frequency. Negative for pelvic pain.  Neurological: Positive for tremors (chronic).       Objective:   Physical Exam  BP 140/82  Pulse 77  Temp(Src) 97.9 F (36.6 C) (Oral)  Wt 126 lb 6.4 oz (57.335 kg)  SpO2 94% Wt Readings from Last 3 Encounters:  11/05/13 126 lb 6.4 oz (57.335 kg)  09/16/13 123 lb 7 oz (55.991 kg)  08/15/13 120 lb 12.8 oz (54.795 kg)    Constitutional: She appears well-developed and well-nourished. No distress.  Neck: Normal range of motion. Neck supple. No JVD present. No thyromegaly present.  Cardiovascular: Normal rate, regular rhythm and normal heart sounds.  No murmur heard. No BLE edema. Pulmonary/Chest: Effort normal and breath sounds normal. No  respiratory distress. She has no wheezes.  Psychiatric: She has a normal mood and affect. Her behavior is normal. Judgment and thought content normal.   Lab Results  Component Value Date   WBC 12.0* 08/11/2013   HGB 14.6 08/11/2013   HCT 43.0 08/11/2013   PLT 208 08/11/2013   GLUCOSE 134* 08/13/2013   CHOL 93 01/30/2013   TRIG 61.0 01/30/2013   HDL 56.60 01/30/2013   LDLDIRECT 77.5 08/11/2009   LDLCALC 24 01/30/2013   ALT 40* 08/11/2013   AST 27 08/11/2013   NA 138 08/13/2013   K 4.9 08/13/2013   CL 104 08/13/2013   CREATININE 1.1 08/13/2013   BUN 15 08/13/2013   CO2 28 08/13/2013   TSH 3.575 05/05/2013   INR 1.23 08/11/2013   HGBA1C 5.4 05/08/2013    Ct Head Wo Contrast  08/11/2013   CLINICAL DATA:  Aphasia  EXAM: CT HEAD WITHOUT CONTRAST  TECHNIQUE: Contiguous axial images were obtained from the base of the skull through the vertex without intravenous contrast.  COMPARISON:  CT 05/03/2012  FINDINGS: Generalized atrophy. Negative for hydrocephalus. Mild chronic microvascular ischemic change in the white matter.  Negative for acute infarct, hemorrhage, or mass lesion. No skull lesion.  IMPRESSION: No acute abnormality.   Electronically Signed   By: Franchot Gallo M.D.   On: 08/11/2013 11:14       Assessment &  Plan:   Dysuria - POC UA with +LE - tx four UTI given classic symptoms - Septra ds x 7d - erx done  Problem List Items Addressed This Visit   ATRIAL FIBRILLATION     Rate controlled, asymptomatic  anticoag with pradaxa -  continue to review risk vs benefit of same (reviewed accidental fall 07/2013 when brusied L side chest and arm but no other injury) Follows with cards annually and prn    [place] with behavioral health psychiatry for same Toxic level of lithium late December 2014 reviewed Medications have been resumed at lower dose since that time - Repeat labs normalized keep followup with psychiatry as ongoing for further medication management of same Remains on  Prozac no new changes recommended by me today    DIABETES MELLITUS, TYPE II      Dx 04/2010 on incidental labs - on metformin since then intermittently follows with endo for same (brennan), but considering following at PCP here overall controlled by hx and labs On statin, ARB -  Follows with [facility] eye exam annually  Check a1c q72mo and adjust tx as needed Lab Results  Component Value Date   HGBA1C 5.4 05/08/2013      Relevant Orders      Hemoglobin A1c      Lipid panel      TSH   HYPOTHYROIDISM      History of thyroiditis, on hormone replacement therapy check every 6 months and adjust tx as needed Lab Results  Component Value Date   TSH 3.575 05/05/2013      Relevant Orders      TSH    Other Visit Diagnoses   Urine frequency    -  Primary    Relevant Orders       POCT Urinalysis Dipstick (Completed)

## 2013-11-05 NOTE — Assessment & Plan Note (Signed)
Follows with behavioral health psychiatry for same Toxic level of lithium late December 2014 reviewed Medications have been resumed at lower dose since that time - Repeat labs normalized keep followup with psychiatry as ongoing for further medication management of same Remains on Prozac no new changes recommended by me today

## 2013-11-05 NOTE — Assessment & Plan Note (Signed)
Dx 04/2010 on incidental labs - on metformin since then intermittently follows with endo for same (brennan), but considering following at PCP here overall controlled by hx and labs On statin, ARB -  Follows with [facility] exam annually  Check a1c q12mo and adjust tx as needed Lab Results  Component Value Date   HGBA1C 5.4 05/08/2013

## 2013-11-06 ENCOUNTER — Encounter: Payer: Self-pay | Admitting: "Endocrinology

## 2013-11-06 ENCOUNTER — Ambulatory Visit: Payer: Medicare HMO | Admitting: "Endocrinology

## 2013-11-06 ENCOUNTER — Ambulatory Visit (INDEPENDENT_AMBULATORY_CARE_PROVIDER_SITE_OTHER): Payer: Medicare HMO | Admitting: "Endocrinology

## 2013-11-06 VITALS — BP 149/83 | HR 110 | Wt 126.0 lb

## 2013-11-06 DIAGNOSIS — E211 Secondary hyperparathyroidism, not elsewhere classified: Secondary | ICD-10-CM

## 2013-11-06 DIAGNOSIS — R251 Tremor, unspecified: Secondary | ICD-10-CM

## 2013-11-06 DIAGNOSIS — IMO0002 Reserved for concepts with insufficient information to code with codable children: Secondary | ICD-10-CM

## 2013-11-06 DIAGNOSIS — E038 Other specified hypothyroidism: Secondary | ICD-10-CM

## 2013-11-06 DIAGNOSIS — R259 Unspecified abnormal involuntary movements: Secondary | ICD-10-CM

## 2013-11-06 DIAGNOSIS — E559 Vitamin D deficiency, unspecified: Secondary | ICD-10-CM

## 2013-11-06 DIAGNOSIS — I10 Essential (primary) hypertension: Secondary | ICD-10-CM

## 2013-11-06 DIAGNOSIS — E118 Type 2 diabetes mellitus with unspecified complications: Secondary | ICD-10-CM

## 2013-11-06 DIAGNOSIS — E1165 Type 2 diabetes mellitus with hyperglycemia: Secondary | ICD-10-CM

## 2013-11-06 DIAGNOSIS — E049 Nontoxic goiter, unspecified: Secondary | ICD-10-CM

## 2013-11-06 DIAGNOSIS — E063 Autoimmune thyroiditis: Secondary | ICD-10-CM

## 2013-11-06 NOTE — Progress Notes (Signed)
Subjective:  Patient Name: Stacy [name] Date of Birth: June 06, 1941  MRN: [id number]  Stacy [name]  presents to the office today for follow-up of her T2DM, hypothyroidism, thyroiditis, hypertension, fatigue, secondary hyperparathyroidism, vitamin D deficiency, hyperlipidemia, tobacco abuse, and tremor.  HISTORY OF PRESENT ILLNESS:   Stacy [name] is a 73 y.o. Caucasian woman.  Stacy [name] was unaccompanied.  1. Patient was first referred to me on 09/04/05 for evaluation and management of chronic hypothyroidism and new hypercalcemia and hyperparathyroidism. In brief, she developed vitamin D deficiency, then secondary hyperparathyroidism, and then tertiary hyperparathyroidism. Her right inferior parathyroid gland was quite enlarged. She was taken to surgery on 12/07/05 for parathyroidectomy. Since that time she's done well, provided that she has continued to take her calcium and vitamin D.  She has had some waxing and waning of her thyroid gland size during these years, which is consistent with inflammatory flare-ups of her Hashimoto's disease. We've increased her thyroid hormone dose slightly over time. She now takes 100 mcg of Synthroid daily. She has remained euthyroid most of the time.   2. The patient has experienced many other problems during the last 8 years, to include bipolar disorder, GERD, fatigue, iron deficiency, 2 heart attacks, hypertension, orthostatic hypotension, atrial fibrillation, and the need for a cardiac pacemaker. We've helped to manage some of those problems and kept her as supplied as we could with samples of antihypertensive medications and Synthroid. She was diagnosed with type 2 diabetes in early 2012 and was started on metformin 500 mg once daily. She was also started on atorvastatin for hyperlipidemia.  3. The patient's last [facility] visit was on 05/08/13. In the interim, she became toxic on lithium in January. She was evaluated in the ED at [facility]. Lithium dose was then reduced. Recently  she developed a UTI, for which she began taking sulfamethoxazole last night, one tablet twice daily for one week. Her memory is a little better than at last visit. She is taking Synthroid, 100 mcg/day and metformin, 500 mg, once daily. She still takes propranolol every day to attempt to control her tremor, but her dose was reduced from 80 mg/day to 60 mg/day.  She also takes atorvastatin, calcium carbonate, fluoxetine, lithium, losartan, and solifenacin. She has significantly increased her intake of sweets and other carbs, to include honey. She has not had any severe problems with vertigo recently. Her allergies have bothered her more in the past 4 months.    4. Pertinent Review of Systems:  Constitutional: The patient says, "I feel pretty good.". Her energy level is variable. . Her attitude has been good.  Eyes: Vision is good as long as she wears her glasses. She had her last eye exam about 4 months ago. There are no other significant eye complaints. Neck: The patient has no complaints of anterior neck swelling, soreness, tenderness,  pressure, discomfort, or difficulty swallowing.  Heart: Her pacemaker is still working. She has no complaints of palpitations, irregular heat beats, chest pain, or chest pressure. Gastrointestinal: "I stay hungry all the time." Bowel movements have normalized. The patient has no complaints of acid reflux, upset stomach, stomach aches or pains. Arms: She is no longer having pain in her shoulders (trapezius areas) when sitting, standing, or lying on her sides.  Legs: She has occasional pains in her left hip and left knee. Muscle mass and strength seem normal. There are no complaints of numbness, tingling, or burning. No edema is noted. Feet: There are no obvious foot problems. There are no complaints of  numbness, tingling, burning, or pain. No edema is noted. Hypoglycemia: None  5. BG printout: We were unable to print out her old meter today. Her AM BGs varied from 83-114.    PAST MEDICAL, FAMILY, AND SOCIAL HISTORY:  Past Medical History  Diagnosis Date  . Chronic bipolar disorder   . Coronary artery disease     s/p PTCA  . Complete AV block     s/p PPM--MEDTR[employer]IC ADAPTA ADDr01  . Takotsubo syndrome   . GERD (gastroesophageal reflux disease)   . COPD (chronic obstructive pulmonary disease)     TOBACCO ABUSE  . Abscess of liver(572.0)   . Atrial fibrillation     chronic anticoag - pradaxa  . BIPOLAR AFFECTIVE DISORDER   . DEPRESSION   . DIABETES MELLITUS, TYPE II dx 04/2010  . DYSLIPIDEMIA   . HYPERTENSION   . HYPOTHYROIDISM     hashimoto's  . TOBACCO ABUSE   . Pacemaker-Medtronic-dual-chamber   . Parathyroid related hypercalcemia   . Primary hyperparathyroidism   . Vitamin D deficiency disease   . Cancer     basal cell on abdomen    Family History  Problem Relation Age of Onset  . CAD Mother   . Colon cancer      FH  . Diabetes Maternal Aunt   . CAD Father   . Diabetes type II Sister     Current outpatient prescriptions:atorvastatin (LIPITOR) 80 MG tablet, Take 80 mg by mouth daily at 6 PM. , Disp: , Rfl: ;  BAYER MICROLET LANCETS lancets, Use as instructed to check blood sugar once daily dx 250.00, Disp: 100 each, Rfl: 3;  Calcium Carbonate (CALCIUM 500 PO), Take 1 tablet by mouth at bedtime. , Disp: , Rfl: ;  dabigatran (PRADAXA) 150 MG CAPS capsule, Take 150 mg by mouth 2 (two) times daily., Disp: , Rfl:  desoximetasone (TOPICORT) 0.05 % cream, Apply 1 application topically 2 (two) times daily as needed (itching)., Disp: , Rfl: ;  donepezil (ARICEPT) 10 MG tablet, Take 10 mg by mouth at bedtime., Disp: , Rfl: ;  FLUoxetine (PROZAC) 20 MG capsule, Take 20 mg by mouth daily before breakfast., Disp: , Rfl: ;  glucose blood (BAYER CONTOUR NEXT TEST) test strip, Use as instructed to check blood sugar once daily dx 250.00, Disp: 100 each, Rfl: 3 levothyroxine (SYNTHROID, LEVOTHROID) 100 MCG tablet, Take 100 mcg by mouth daily before  breakfast., Disp: , Rfl: ;  lithium carbonate 300 MG capsule, Take 300 mg by mouth daily., Disp: , Rfl: ;  losartan (COZAAR) 50 MG tablet, Take 1 tablet (50 mg total) by mouth daily before breakfast., Disp: 90 tablet, Rfl: 1;  meloxicam (MOBIC) 7.5 MG tablet, Take 7.5 mg by mouth daily., Disp: , Rfl:  metFORMIN (GLUCOPHAGE-XR) 500 MG 24 hr tablet, Take 1 tablet (500 mg total) by mouth daily with breakfast., Disp: 14 tablet, Rfl: 0;  Multiple Vitamin (MULTIVITAMIN) tablet, Take 1 tablet by mouth at bedtime. , Disp: , Rfl: ;  omeprazole (PRILOSEC) 40 MG capsule, Take 1 capsule (40 mg total) by mouth daily before breakfast., Disp: 90 capsule, Rfl: 3 propranolol ER (INDERAL LA) 60 MG 24 hr capsule, Take 1 capsule (60 mg total) by mouth daily., Disp: 90 capsule, Rfl: 1;  Propylene Glycol (SYSTANE BALANCE OP), Apply 1 drop to eye at bedtime., Disp: , Rfl: ;  solifenacin (VESICARE) 5 MG tablet, Take 5 mg by mouth daily., Disp: , Rfl: ;  sulfamethoxazole-trimethoprim (SEPTRA DS) 800-160 MG per tablet, Take 1  tablet by mouth 2 (two) times daily., Disp: 14 tablet, Rfl: 0 azelastine (ASTELIN) 137 MCG/SPRAY nasal spray, Place 2 sprays into both nostrils 2 (two) times daily. Use in each nostril as directed, Disp: 30 mL, Rfl: 2;  loratadine (CLARITIN) 10 MG tablet, Take 1 tablet (10 mg total) by mouth daily., Disp: 30 tablet, Rfl: 11  Allergies as of 11/06/2013 - Review Complete 11/06/2013  Allergen Reaction Noted  . Codeine    . Latex Other (See Comments) 05/03/2012  . Metronidazole Other (See Comments) 03/26/2008  . Other  01/24/2013  . Penicillins      1. Work and Family: Her daughter, Stacy [name], lives with her. 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 Stacy [name] had had more variability in mood recently.  2. Activities: Stacy [name] has not been walking much.     3. Smoking, alcohol, or drugs: She stopped smoking, but uses a nicotine-free e-cigarette.   4. Primary Care Provider: Gwendolyn Grant, MD  REVIEW OF SYSTEMS: There are no  other significant problems involving Stacy [name]'s other body systems.   Objective:  Vital Signs:  BP 149/83  Pulse 110  Wt 126 lb (57.153 kg)   Ht Readings from Last 3 Encounters:  09/16/13 5\' 6"  (1.676 m)  08/11/13 5\' 6"  (1.676 m)  06/19/13 5\' 6"  (1.676 m)   Wt Readings from Last 3 Encounters:  11/06/13 126 lb (57.153 kg)  11/05/13 126 lb 6.4 oz (57.335 kg)  09/16/13 123 lb 7 oz (55.991 kg)   PHYSICAL EXAM:  Constitutional: The patient appears healthy and well nourished.  Her weight has increased by 2 pounds in 5 months. Her mood and affect are normal. Her insight is good.  Head: Her head tremor is still present, but is reduced since starting propranolol.  Face: The face appears normal.  Eyes: There is no obvious arcus or proptosis. Moisture appears normal. Mouth: The oropharynx and tongue appear normal. Oral moisture is normal. Neck: The neck appears to be visibly normal. No carotid bruits are noted. She has a low-lying thyroid gland. The thyroid gland is probably within normal size at 18-20 grams. Both lobes today are within normal limits for size. The consistency of the thyroid gland is normal. The thyroid gland is not tender to palpation. Lungs: The lungs are clear to auscultation. Air movement is good. Heart: Heart rate and Stacy are regular. Heart sounds S1 and S2 are normal. I did not appreciate any pathologic cardiac murmurs. Abdomen: The abdomen is mildly enlarged. Bowel sounds are normal. There is no obvious hepatomegaly, splenomegaly, or other mass effect.  Arms: Muscle size and bulk are normal for age. She has no cog-wheeling of her arms when I passively flex and extend her arms at the elbows. Hands: She has a 2+ hand tremor. Phalangeal and metacarpophalangeal joints are normal. Palmar muscles are normal. Palmar skin is normal. Palmar moisture is also normal. Fingernails are somewhat pale. Legs: Muscles appear normal for age. No edema is present. Feet: She has a 2+ dorsalis  pedis pulses bilaterally.  Neurologic: Strength is normal for age in both the upper and lower extremities. Muscle tone is normal. Sensation to touch is normal in both legs and feet. She walked normally, without any of the hesitation and rigidity usually seen with Parkinson's disease.   LAB DATA: Hemoglobin A1c yesterday was 6.1%, compared with 5.4% at last visit and 5.9% at the visit prior.    Labs 11/05/13: Cholesterol 117, triglycerides 112, HDL 63.5, LDL 31; TSH 4.84  Labs 05/05/13: TSH 3.575, free  T4 1.46, free T3 2.5  Labs 01/30/13: Cholesterol 93, triglycerides 61, HDL 56, LDL 24  Labs 10/30/12: TSH 2.550, free T4 1.24, free T3 2.5; calcium 10.4, PTH 36.3, 24-hydroxy vitamin D 42; lithium 0.80 (0.80-1.40)   Labs 04/18/12: TSH was 2.106, Free T4 was 1.41. Free T3 was 2.8.                   Labs 10/25/11: PTH 30.8, calcium 10.4, 25-hydroxy vitamin D 43; WBC 7.8, hemoglobin 14.9, hematocrit 46.9%, iron 71             Assessment and Plan:   ASSESSMENT:  1&2. Hypothyroid/ Hashimoto's thyroiditis: Her TFTS in September 2014 , at first glance, did not make sense. This pattern was c/w a recent flare up of Hashimoto's Dz. We could not make any decisions about whether or not to change her Synthroid dose until the TFTs stabilized. Her thyroiditis is clinically quiescent  3. Hypertension: Her blood pressure is elevated, much higher than at last visit. The reduction in her Lopressor dose may have contributed to the higher BP. She has also not been exercising. If she resumes exercising, however, she may not need any increase in medication doses.   4. T2DM: Her HbA1c was elevated yesterday, c/w the combination of increased carb intake, decreased exercise, and weight gain. She needs to watch her carbohydrate intake and resume exercise. We do not need to increase her metformin to twice daily.   5.  Fatigue: She is really feeling very well. 6.  Hyperparathyroidism and vitamin D deficiency: Her labs in March  2014 were normal. She was very compliant with taking her Caltrate-D and MVI each evening.  7. Tobacco abuse: She stopped smoking, but instead is using a "nicotine-free" e-cigarette. 8. Tremor: Her tremor is not due to Parkinson's Dz. She is doing better with propranolol.  9. Pallor: Her pallor has improved over time. Her iron level, hemoglobin, and hematocrit were fine 2 years ago.  10. Goiter: Her thyroid gland is again within normal size today.   PLAN:  1. Diagnostic:  TFTs in 3 months ( I gave her the slip for these tests.).  TFTs, calcium, PTH, lithium, and 25-hydroxy vitamin D prior to next visit.  2. Therapeutic:  Eat Right, exercise right (walk 30-60 minutes per day), and take your Synthroid metformin.  3. Patient education: We talked a lot about her tremor, her smoking, and her thyroiditis and hypothyroidism. 4. Follow-up: 6 months with me. Please schedule FU visits with Dr. Asa Lente as she desires.  Level of Service: This visit lasted in excess of 50 minutes. More than 50% of the visit was devoted to counseling.   Sherrlyn Hock, MD 11/06/2013 10:37 AM

## 2013-11-07 ENCOUNTER — Ambulatory Visit: Payer: Medicare HMO | Admitting: Internal Medicine

## 2013-11-10 ENCOUNTER — Encounter: Payer: Self-pay | Admitting: Internal Medicine

## 2013-11-10 ENCOUNTER — Other Ambulatory Visit: Payer: Self-pay | Admitting: *Deleted

## 2013-11-10 ENCOUNTER — Ambulatory Visit (INDEPENDENT_AMBULATORY_CARE_PROVIDER_SITE_OTHER): Payer: Commercial Managed Care - HMO | Admitting: Internal Medicine

## 2013-11-10 VITALS — BP 153/87 | HR 63 | Ht 66.0 in | Wt 128.0 lb

## 2013-11-10 DIAGNOSIS — Z950 Presence of cardiac pacemaker: Secondary | ICD-10-CM

## 2013-11-10 DIAGNOSIS — I442 Atrioventricular block, complete: Secondary | ICD-10-CM

## 2013-11-10 DIAGNOSIS — I498 Other specified cardiac arrhythmias: Secondary | ICD-10-CM

## 2013-11-10 DIAGNOSIS — R001 Bradycardia, unspecified: Secondary | ICD-10-CM

## 2013-11-10 LAB — MDC_IDC_ENUM_SESS_TYPE_INCLINIC
Battery Impedance: 2208 Ohm
Battery Remaining Longevity: 22 mo
Battery Voltage: 2.72 V
Brady Statistic AP VP Percent: 21 %
Brady Statistic AP VS Percent: 78 %
Brady Statistic AS VP Percent: 0 %
Brady Statistic AS VS Percent: 1 %
Date Time Interrogation Session: 20150406095316
Lead Channel Impedance Value: 1090 Ohm
Lead Channel Impedance Value: 554 Ohm
Lead Channel Pacing Threshold Amplitude: 0.5 V
Lead Channel Pacing Threshold Amplitude: 1 V
Lead Channel Pacing Threshold Pulse Width: 0.4 ms
Lead Channel Pacing Threshold Pulse Width: 0.4 ms
Lead Channel Sensing Intrinsic Amplitude: 15.67 mV
Lead Channel Sensing Intrinsic Amplitude: 4 mV
Lead Channel Setting Pacing Amplitude: 2 V
Lead Channel Setting Pacing Amplitude: 2.5 V
Lead Channel Setting Pacing Pulse Width: 0.4 ms
Lead Channel Setting Sensing Sensitivity: 5.6 mV

## 2013-11-10 MED ORDER — ACCU-CHEK NANO SMARTVIEW W/DEVICE KIT: PACK | Status: DC

## 2013-11-10 MED ORDER — GLUCOSE BLOOD VI STRP: ORAL_STRIP | Status: DC

## 2013-11-10 MED ORDER — ACCU-CHEK SMARTVIEW CONTROL VI LIQD: Status: DC

## 2013-11-10 MED ORDER — ACCU-CHEK FASTCLIX LANCETS MISC: 1.0000 | Freq: Two times a day (BID) | Status: DC

## 2013-11-10 NOTE — Progress Notes (Signed)
Patient Care Team: Rowe Clack, MD as PCP - General Deboraha Sprang, MD (Cardiology) Sherrlyn Hock, MD (Endocrinology) Dennard Nip, NP as Nurse Practitioner (Psychiatry) Sable Feil, MD (Gastroenterology) Thalia Bloodgood (Optometry) Dayton Martes, MD (Sports Medicine) Eustace Quail Tat, DO (Neurology)   HPI  Stacy [name] is a 73 y.o. female seen in followup for bradycardia and intermittent complete heart block. She is status post pacemaker implantation.  She has a history of coronary disease and has had problems with recurrent chest pain. We undertook Myoview scan in the 2010. She then underwent catheterization demonstrating no obstruction; her previously implanted stent was patent her ejection fraction was normal   The patient denies chest pain, shortness of breath, nocturnal dyspnea, orthopnea or peripheral edema. There have been no palpitations, lightheadedness or syncope.    She is smoking a little bit less  Recently hospitalized for Lithium toxicity  Now improved     Past Medical History  Diagnosis Date  . Chronic bipolar disorder   . Coronary artery disease     s/p PTCA  . Complete AV block     s/p PPM--MEDTRONIC ADAPTA ADDr01  . Takotsubo syndrome   . GERD (gastroesophageal reflux disease)   . COPD (chronic obstructive pulmonary disease)     TOBACCO ABUSE  . Abscess of liver(572.0)   . Atrial fibrillation     chronic anticoag - pradaxa  . BIPOLAR AFFECTIVE DISORDER   . DEPRESSION   . DIABETES MELLITUS, TYPE II dx 04/2010  . DYSLIPIDEMIA   . HYPERTENSION   . HYPOTHYROIDISM     hashimoto's  . TOBACCO ABUSE   . Pacemaker-Medtronic-dual-chamber   . Parathyroid related hypercalcemia   . Primary hyperparathyroidism   . Vitamin D deficiency disease   . Cancer     basal cell on abdomen    Past Surgical History  Procedure Laterality Date  . Insert / replace / remove pacemaker      MEDTRONIC ADAPTA ADDr01  . Parathyroidectomy        RIGHT INFERIOR  . Abdominal hysterectomy    . Cardiac catheterization    . Mass excision Left 01/27/2013    Procedure: EXCISION MASS LEFT FLANK;  Surgeon: Earnstine Regal, MD;  Location: [facility];  Service: General;  Laterality: Left;    Current Outpatient Prescriptions  Medication Sig Dispense Refill  . atorvastatin (LIPITOR) 80 MG tablet Take 80 mg by mouth daily at 6 PM.       . azelastine (ASTELIN) 137 MCG/SPRAY nasal spray Place 2 sprays into both nostrils 2 (two) times daily. Use in each nostril as directed  30 mL  2  . BAYER MICROLET LANCETS lancets Use as instructed to check blood sugar once daily dx 250.00  100 each  3  . Calcium Carbonate (CALCIUM 500 PO) Take 1 tablet by mouth at bedtime.       . dabigatran (PRADAXA) 150 MG CAPS capsule Take 150 mg by mouth 2 (two) times daily.      Marland Kitchen desoximetasone (TOPICORT) 0.05 % cream Apply 1 application topically 2 (two) times daily as needed (itching).      Marland Kitchen donepezil (ARICEPT) 10 MG tablet Take 10 mg by mouth at bedtime.      Marland Kitchen FLUoxetine (PROZAC) 20 MG capsule Take 20 mg by mouth daily before breakfast.      . glucose blood (BAYER CONTOUR NEXT TEST) test strip Use as instructed to check blood sugar once daily dx  250.00  100 each  3  . levothyroxine (SYNTHROID, LEVOTHROID) 100 MCG tablet Take 100 mcg by mouth daily before breakfast.      . lithium carbonate 300 MG capsule Take 300 mg by mouth daily.      Marland Kitchen loratadine (CLARITIN) 10 MG tablet Take 1 tablet (10 mg total) by mouth daily.  30 tablet  11  . losartan (COZAAR) 50 MG tablet Take 1 tablet (50 mg total) by mouth daily before breakfast.  90 tablet  1  . meloxicam (MOBIC) 7.5 MG tablet Take 7.5 mg by mouth daily.      . metFORMIN (GLUCOPHAGE-XR) 500 MG 24 hr tablet Take 1 tablet (500 mg total) by mouth daily with breakfast.  14 tablet  0  . Multiple Vitamin (MULTIVITAMIN) tablet Take 1 tablet by mouth at bedtime.       Marland Kitchen omeprazole (PRILOSEC) 40 MG capsule Take 1 capsule (40 mg total)  by mouth daily before breakfast.  90 capsule  3  . propranolol ER (INDERAL LA) 60 MG 24 hr capsule Take 1 capsule (60 mg total) by mouth daily.  90 capsule  1  . Propylene Glycol (SYSTANE BALANCE OP) Apply 1 drop to eye at bedtime.      . solifenacin (VESICARE) 5 MG tablet Take 5 mg by mouth daily.       No current facility-administered medications for this visit.    Allergies  Allergen Reactions  . Codeine     REACTION: vomiting  . Latex Other (See Comments)    irritation  . Metronidazole Other (See Comments)    Reaction unknown  . Other     Bandaids cause rash  . Penicillins     REACTION: throat swelling, rash    Review of Systems negative except from HPI and PMH  Physical Exam BP 153/87  Pulse 63  Ht 5\' 6"  (1.676 m)  Wt 128 lb (58.06 kg)  BMI 20.67 kg/m2 Well developed and well nourished in no acute distress HENT normal E scleral and icterus clear Neck Supple JVP flat; carotids brisk and full Clear to ausculation Device pocket well healed; without hematoma or erythema.  There is no tethering Regular rate and rhythm, no murmurs gallops or rub Soft with active bowel sounds No clubbing cyanosis  Edema Alert and oriented, grossly normal motor and sensory function Skin Warm and Dry  ECG demonstrates atrial pacing Intervals 36/10/41   Assessment and  Plan  Sinus node dysfunction  First degree AV block  Pacemaker-Medtronic The patient's device was interrogated.  The information was reviewed. No changes were made in the programming.     Hypertension  Atrial fibrillation   Cigarette abuse  She is having frequent recurrences of atrial fibrillation. She remains on dabigitran. Blood pressure is reasonably controlled. She continues to smoke. She is cutting down a little bit. She is not going to stop.

## 2013-11-10 NOTE — Patient Instructions (Signed)
Your physician recommends that you continue on your current medications as directed. Please refer to the Current Medication list given to you today.  Please send transmission once you have received your MySmart monitor in the mail.   Your physician wants you to follow-up in: 1 year with Dr. Caryl Comes.  You will receive a reminder letter in the mail two months in advance. If you don't receive a letter, please call our office to schedule the follow-up appointment.

## 2013-11-27 ENCOUNTER — Other Ambulatory Visit: Payer: Self-pay | Admitting: *Deleted

## 2013-11-27 MED ORDER — METFORMIN HCL ER 500 MG PO TB24: 500.0000 mg | ORAL_TABLET | Freq: Every day | ORAL | Status: DC

## 2013-12-09 ENCOUNTER — Telehealth: Payer: Self-pay | Admitting: Neurology

## 2013-12-09 MED ORDER — DONEPEZIL HCL 10 MG PO TABS: 10.0000 mg | ORAL_TABLET | Freq: Every day | ORAL | Status: DC

## 2013-12-09 NOTE — Telephone Encounter (Signed)
Pt called requesting a refill for her meds.

## 2013-12-09 NOTE — Telephone Encounter (Signed)
Patient needed Aricept refilled and Propranolol. Aricept sent to patient's pharmacy. Propranolol was refilled in February for 6 months, she will call when she runs low on this medication.

## 2013-12-11 ENCOUNTER — Ambulatory Visit (INDEPENDENT_AMBULATORY_CARE_PROVIDER_SITE_OTHER): Payer: Commercial Managed Care - HMO | Admitting: Physician Assistant

## 2013-12-11 ENCOUNTER — Encounter: Payer: Self-pay | Admitting: Physician Assistant

## 2013-12-11 VITALS — BP 134/90 | HR 64 | Temp 98.1°F | Resp 16 | Wt 125.0 lb

## 2013-12-11 DIAGNOSIS — R55 Syncope and collapse: Secondary | ICD-10-CM

## 2013-12-11 NOTE — Progress Notes (Signed)
Pre visit review using our clinic review tool, if applicable. No additional management support is needed unless otherwise documented below in the visit note. 
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
## 2013-12-12 ENCOUNTER — Ambulatory Visit (INDEPENDENT_AMBULATORY_CARE_PROVIDER_SITE_OTHER): Payer: Medicare HMO | Admitting: *Deleted

## 2013-12-12 VITALS — BP 145/73 | HR 60

## 2013-12-12 DIAGNOSIS — I442 Atrioventricular block, complete: Secondary | ICD-10-CM

## 2013-12-12 LAB — MDC_IDC_ENUM_SESS_TYPE_INCLINIC
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
## 2013-12-15 ENCOUNTER — Ambulatory Visit (INDEPENDENT_AMBULATORY_CARE_PROVIDER_SITE_OTHER): Payer: Medicare HMO | Admitting: Neurology

## 2013-12-15 ENCOUNTER — Encounter: Payer: Self-pay | Admitting: Neurology

## 2013-12-15 VITALS — BP 112/70 | HR 64 | Wt 123.6 lb

## 2013-12-15 DIAGNOSIS — R259 Unspecified abnormal involuntary movements: Secondary | ICD-10-CM

## 2013-12-15 DIAGNOSIS — F319 Bipolar disorder, unspecified: Secondary | ICD-10-CM

## 2013-12-15 DIAGNOSIS — G251 Drug-induced tremor: Secondary | ICD-10-CM

## 2013-12-15 DIAGNOSIS — G250 Essential tremor: Secondary | ICD-10-CM

## 2013-12-15 DIAGNOSIS — R55 Syncope and collapse: Secondary | ICD-10-CM

## 2013-12-15 DIAGNOSIS — G252 Other specified forms of tremor: Secondary | ICD-10-CM

## 2013-12-15 DIAGNOSIS — R251 Tremor, unspecified: Secondary | ICD-10-CM

## 2013-12-15 DIAGNOSIS — Z5181 Encounter for therapeutic drug level monitoring: Secondary | ICD-10-CM

## 2013-12-15 MED ORDER — PROPRANOLOL HCL 40 MG PO TABS: 40.0000 mg | ORAL_TABLET | Freq: Every day | ORAL | Status: DC

## 2013-12-15 NOTE — Patient Instructions (Signed)
1. We will decrease your propranolol to 40 mg daily - we have sent the RX into your pharmacy.  2. Let us know what your primary MD says about your orthostatic hypotension- we may need to schedule you for an EEG.  3. Your provider has requested that you have labwork completed today. Please go to [facility] on the first floor of this building before leaving the office today. 4. Follow up 3-4 months.
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
## 2013-12-16 ENCOUNTER — Telehealth: Payer: Self-pay | Admitting: Neurology

## 2013-12-16 LAB — TSH: TSH: 4.984 u[IU]/mL — AB (ref 0.350–4.500)

## 2013-12-16 LAB — LITHIUM LEVEL: LITHIUM LVL: 0.6 meq/L — AB (ref 0.80–1.40)

## 2013-12-16 NOTE — Telephone Encounter (Signed)
Message copied by Annamaria Helling on Tue Dec 16, 2013  9:14 AM ------      Message from: TAT, REBECCA S      Created: Tue Dec 16, 2013  8:39 AM       Luvenia Starch, let pt know that TSH just slightly high (not bad) and lithium level normal and just to let PCP know ------

## 2013-12-16 NOTE — Telephone Encounter (Signed)
Patient made aware of lab results. Faxed to PCP Dr Asa Lente at [phone].

## 2013-12-19 ENCOUNTER — Ambulatory Visit (INDEPENDENT_AMBULATORY_CARE_PROVIDER_SITE_OTHER): Payer: Medicare HMO | Admitting: Internal Medicine

## 2013-12-19 ENCOUNTER — Encounter: Payer: Self-pay | Admitting: Internal Medicine

## 2013-12-19 VITALS — BP 137/76 | HR 61 | Ht 66.0 in | Wt 127.0 lb

## 2013-12-19 DIAGNOSIS — R55 Syncope and collapse: Secondary | ICD-10-CM

## 2013-12-19 DIAGNOSIS — Z950 Presence of cardiac pacemaker: Secondary | ICD-10-CM

## 2013-12-19 DIAGNOSIS — I442 Atrioventricular block, complete: Secondary | ICD-10-CM

## 2013-12-19 DIAGNOSIS — R001 Bradycardia, unspecified: Secondary | ICD-10-CM

## 2013-12-19 DIAGNOSIS — I498 Other specified cardiac arrhythmias: Secondary | ICD-10-CM

## 2013-12-19 NOTE — Patient Instructions (Signed)
Your physician recommends that you continue on your current medications as directed. Please refer to the Current Medication list given to you today.  Your physician recommends that you schedule a follow-up appointment in: 3-4 months with Dr. Klein.   
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
## 2013-12-22 ENCOUNTER — Telehealth: Payer: Self-pay | Admitting: Internal Medicine

## 2013-12-22 DIAGNOSIS — L819 Disorder of pigmentation, unspecified: Secondary | ICD-10-CM

## 2013-12-22 NOTE — Telephone Encounter (Signed)
Pt wants a referral to a dermatologist that takes [facility].  A purple place has come up on her face about as large as a dime, but not round.

## 2013-12-23 ENCOUNTER — Encounter: Payer: Self-pay | Admitting: Internal Medicine

## 2013-12-23 NOTE — Telephone Encounter (Signed)
Notified pt md has place the referral will received call back from [facility] with appt, date and time...Stacy [name]

## 2014-01-04 ENCOUNTER — Other Ambulatory Visit: Payer: Self-pay | Admitting: Internal Medicine

## 2014-01-05 ENCOUNTER — Telehealth: Payer: Self-pay | Admitting: Internal Medicine

## 2014-01-07 ENCOUNTER — Ambulatory Visit (INDEPENDENT_AMBULATORY_CARE_PROVIDER_SITE_OTHER): Payer: Medicare HMO | Admitting: Family Medicine

## 2014-01-07 VITALS — BP 122/74 | HR 78 | Temp 98.0°F | Resp 17 | Ht 65.5 in | Wt 123.0 lb

## 2014-01-07 DIAGNOSIS — J209 Acute bronchitis, unspecified: Secondary | ICD-10-CM

## 2014-01-07 DIAGNOSIS — R05 Cough: Secondary | ICD-10-CM

## 2014-01-07 DIAGNOSIS — R059 Cough, unspecified: Secondary | ICD-10-CM

## 2014-01-07 LAB — POCT CBC
Granulocyte percent: 67.2 %G (ref 37–80)
HEMATOCRIT: 47.7 % (ref 37.7–47.9)
Hemoglobin: 15 g/dL (ref 12.2–16.2)
LYMPH, POC: 3.7 — AB (ref 0.6–3.4)
MCH: 30.7 pg (ref 27–31.2)
MCHC: 31.4 g/dL — AB (ref 31.8–35.4)
MCV: 97.5 fL — AB (ref 80–97)
MID (cbc): 0.7 (ref 0–0.9)
MPV: 8.4 fL (ref 0–99.8)
POC Granulocyte: 8.9 — AB (ref 2–6.9)
POC LYMPH PERCENT: 27.6 %L (ref 10–50)
POC MID %: 5.2 % (ref 0–12)
Platelet Count, POC: 207 10*3/uL (ref 142–424)
RBC: 4.89 M/uL (ref 4.04–5.48)
RDW, POC: 14.2 %
WBC: 13.3 10*3/uL — AB (ref 4.6–10.2)

## 2014-01-07 MED ORDER — BENZONATATE 100 MG PO CAPS: 100.0000 mg | ORAL_CAPSULE | Freq: Three times a day (TID) | ORAL | Status: DC | PRN

## 2014-01-07 MED ORDER — DOXYCYCLINE HYCLATE 100 MG PO CAPS: 100.0000 mg | ORAL_CAPSULE | Freq: Two times a day (BID) | ORAL | Status: DC

## 2014-01-07 NOTE — Progress Notes (Signed)
[facility] [address] [place] [address] [phone]  Date:  01/07/2014   Name:  Stacy [name]   DOB:  [birth year]   MRN:  [id number]  PCP:  Gwendolyn Grant, MD    Chief Complaint: Cough, URI, Sinusitis and Nasal Congestion   History of Present Illness:  Stacy [name] is a 73 y.o. very pleasant female patient who presents with the following:  History of bradycardia and complete heart block- has a pacemaker.  Had a negative cath in 2010. Saw Dr. Caryl Comes last month.  She had a syncopal episode last month that was thought to be due to orthostatic episode.  Decreased propranolol and changed losartan to the evening.    She is here today with a cough, "drippy nose", difficulty sleeping.  She has not noted a fever.   Her sx have been present for nearly a week now.  She has pressure in her sinuses.   The cough can be productive- not always though. She has noted a ST. No GI symptoms.   Her daughter has been ill as well.    She is on pradaxa for intermittent a fib. She is on metformin for her DM- no insulin  Her dizziness is now resolved with medication changes  Patient Active Problem List   Diagnosis Date Noted  . Allergic rhinitis 08/04/2013  . Hypothyroidism, acquired, autoimmune 05/10/2013  . OAB (overactive bladder)   . Parathyroid related hypercalcemia   . Primary hyperparathyroidism   . Vitamin D deficiency disease   . Thyroiditis, autoimmune   . Pacemaker-Medtronic-dual-chamber 11/17/2010  . DIABETES MELLITUS, TYPE II 09/08/2010  . TREMOR 01/17/2010  . ATRIAL FIBRILLATION 11/11/2009  . DEPRESSION 08/11/2009  . ABSCESS OF LIVER 08/11/2009  . TOBACCO ABUSE 03/22/2009  . CORONARY ARTERY DISEASE, S/P PTCA 03/26/2008  . AV block-complete-intermittent 03/26/2008  . HYPOTHYROIDISM 02/25/2008  . DYSLIPIDEMIA 02/25/2008  . BIPOLAR AFFECTIVE DISORDER 02/25/2008  . HYPERTENSION 02/25/2008  . DIVERTICULOSIS, COLON 02/25/2008    Past Medical  History  Diagnosis Date  . Chronic bipolar disorder   . Coronary artery disease     s/p PTCA  . Complete AV block     s/p PPM--MEDTRONIC ADAPTA ADDr01  . Takotsubo syndrome   . GERD (gastroesophageal reflux disease)   . COPD (chronic obstructive pulmonary disease)     TOBACCO ABUSE  . Abscess of liver(572.0)   . Atrial fibrillation     chronic anticoag - pradaxa  . BIPOLAR AFFECTIVE DISORDER   . DEPRESSION   . DIABETES MELLITUS, TYPE II dx 04/2010  . DYSLIPIDEMIA   . HYPERTENSION   . HYPOTHYROIDISM     hashimoto's  . TOBACCO ABUSE   . Pacemaker-Medtronic-dual-chamber   . Parathyroid related hypercalcemia   . Primary hyperparathyroidism   . Vitamin D deficiency disease   . Cancer     basal cell on abdomen    Past Surgical History  Procedure Laterality Date  . Insert / replace / remove pacemaker      MEDTRONIC ADAPTA ADDr01  . Parathyroidectomy      RIGHT INFERIOR  . Abdominal hysterectomy    . Cardiac catheterization    . Mass excision Left 01/27/2013    Procedure: EXCISION MASS LEFT FLANK;  Surgeon: Earnstine Regal, MD;  Location: [facility];  Service: General;  Laterality: Left;    History  Substance Use Topics  . Smoking status: Current Every Day Smoker -- 0.50 packs/day for 48 years    Types:  Cigarettes  . Smokeless tobacco: Never Used     Comment: switched to vapor cigs. Widowed- 2 girls. Lives with daughter (who is nurse at [employer])  . Alcohol Use: No     Comment: "once in a blue moon" when I have [place] food    Family History  Problem Relation Age of Onset  . CAD Mother   . Colon cancer      FH  . Diabetes Maternal Aunt   . CAD Father   . Diabetes type II Sister     Allergies  Allergen Reactions  . Codeine     REACTION: vomiting  . Latex Other (See Comments)    irritation  . Metronidazole Other (See Comments)    Reaction unknown  . Other     Bandaids cause rash  . Penicillins     REACTION: throat swelling, rash    Medication list has been  reviewed and updated.  Current Outpatient Prescriptions on File Prior to Visit  Medication Sig Dispense Refill  . ACCU-CHEK FASTCLIX LANCETS MISC 1 each by Does not apply route 2 (two) times daily. USE TO HELP CHECK BLOOD SUGAR TWICE A DAY DX 250.00  180 each  3  . atorvastatin (LIPITOR) 80 MG tablet Take 80 mg by mouth daily at 6 PM.       . azelastine (ASTELIN) 137 MCG/SPRAY nasal spray Place 2 sprays into both nostrils 2 (two) times daily. Use in each nostril as directed  30 mL  2  . BAYER MICROLET LANCETS lancets Use as instructed to check blood sugar once daily dx 250.00  100 each  3  . Blood Glucose Calibration (ACCU-CHEK SMARTVIEW CONTROL) LIQD USE AD DIRECTED  3 each  0  . Blood Glucose Monitoring Suppl (ACCU-CHEK NANO SMARTVIEW) W/DEVICE KIT USE TO CHECK BLOOD SUGARS TWICE A DAY DX 250.00  1 kit  0  . Calcium Carbonate (CALCIUM 500 PO) Take 1 tablet by mouth at bedtime.       . dabigatran (PRADAXA) 150 MG CAPS capsule Take 150 mg by mouth 2 (two) times daily.      Marland Kitchen desoximetasone (TOPICORT) 0.05 % cream Apply 1 application topically 2 (two) times daily as needed (itching).      Marland Kitchen donepezil (ARICEPT) 10 MG tablet Take 1 tablet (10 mg total) by mouth at bedtime.  90 tablet  3  . FLUoxetine (PROZAC) 20 MG capsule Take 20 mg by mouth daily before breakfast.      . glucose blood (ACCU-CHEK SMARTVIEW) test strip DX 250.00  180 each  3  . glucose blood (BAYER CONTOUR NEXT TEST) test strip Use as instructed to check blood sugar once daily dx 250.00  100 each  3  . levothyroxine (SYNTHROID, LEVOTHROID) 100 MCG tablet Take 100 mcg by mouth daily before breakfast.      . lithium carbonate 300 MG capsule Take 300 mg by mouth daily.      Marland Kitchen loratadine (CLARITIN) 10 MG tablet Take 1 tablet (10 mg total) by mouth daily.  30 tablet  11  . losartan (COZAAR) 50 MG tablet Take 1 tablet (50 mg total) by mouth daily before breakfast.  90 tablet  1  . metFORMIN (GLUCOPHAGE-XR) 500 MG 24 hr tablet Take 1  tablet (500 mg total) by mouth daily with breakfast.  90 tablet  3  . Multiple Vitamin (MULTIVITAMIN) tablet Take 1 tablet by mouth at bedtime.       Marland Kitchen omeprazole (PRILOSEC) 40 MG capsule  Take 1 capsule (40 mg total) by mouth daily before breakfast.  90 capsule  3  . propranolol (INDERAL) 40 MG tablet Take 1 tablet (40 mg total) by mouth daily.  90 tablet  3  . Propylene Glycol (SYSTANE BALANCE OP) Apply 1 drop to eye at bedtime.      . solifenacin (VESICARE) 5 MG tablet Take 5 mg by mouth daily.      . VESICARE 5 MG tablet take 1 tablet by mouth once daily  30 tablet  5   No current facility-administered medications on file prior to visit.    Review of Systems:  As per HPI- otherwise negative.   Physical Examination: Filed Vitals:   01/07/14 1353  BP: 122/74  Pulse: 78  Temp: 98 F (36.7 C)  Resp: 17   Filed Vitals:   01/07/14 1353  Height: 5' 5.5" (1.664 m)  Weight: 123 lb (55.792 kg)   Body mass index is 20.15 kg/(m^2). Ideal Body Weight: Weight in (lb) to have BMI = 25: 152.2  GEN: WDWN, NAD, Non-toxic, A & O x 3, looks well HEENT: Atraumatic, Normocephalic. Neck supple. No masses, No LAD.  Bilateral TM wnl, oropharynx normal.  PEERL,EOMI.   Ears and Nose: No external deformity. CV: RRR, No M/G/R. No JVD. No thrill. No extra heart sounds.  Currently in sinus PULM: CTA B, no wheezes, crackles, rhonchi. No retractions. No resp. distress. No accessory muscle use. ABD: S, NT, ND EXTR: No c/c/e NEURO Normal gait.  PSYCH: Normally interactive. Conversant. Not depressed or anxious appearing.  Calm demeanor.   Results for orders placed in visit on 01/07/14  POCT CBC      Result Value Ref Range   WBC 13.3 (*) 4.6 - 10.2 K/uL   Lymph, poc 3.7 (*) 0.6 - 3.4   POC LYMPH PERCENT 27.6  10 - 50 %L   MID (cbc) 0.7  0 - 0.9   POC MID % 5.2  0 - 12 %M   POC Granulocyte 8.9 (*) 2 - 6.9   Granulocyte percent 67.2  37 - 80 %G   RBC 4.89  4.04 - 5.48 M/uL   Hemoglobin 15.0  12.2 -  16.2 g/dL   HCT, POC 47.7  37.7 - 47.9 %   MCV 97.5 (*) 80 - 97 fL   MCH, POC 30.7  27 - 31.2 pg   MCHC 31.4 (*) 31.8 - 35.4 g/dL   RDW, POC 14.2     Platelet Count, POC 207  142 - 424 K/uL   MPV 8.4  0 - 99.8 fL    Assessment and Plan: Cough - Plan: POCT CBC, doxycycline (VIBRAMYCIN) 100 MG capsule, benzonatate (TESSALON) 100 MG capsule  Saudia is a nice older lady with multiple medical problems, cough and leukocytosis.  Will treat with doxycycline and tessalon as needed.  She will let me know if not better soon- Sooner if worse.     Signed Lamar Blinks, MD

## 2014-01-07 NOTE — Patient Instructions (Signed)
Take the antibiotic as directed- let me know if you do not feel better in the next few days, sooner if you are worse!   It does appear that you have bronchitis.  Your white blood cell count is high, which may indicate a bacterial infection.

## 2014-01-09 ENCOUNTER — Other Ambulatory Visit: Payer: Self-pay | Admitting: *Deleted

## 2014-01-09 MED ORDER — LOSARTAN POTASSIUM 50 MG PO TABS: 50.0000 mg | ORAL_TABLET | Freq: Every day | ORAL | Status: DC

## 2014-01-10 DIAGNOSIS — Z0279 Encounter for issue of other medical certificate: Secondary | ICD-10-CM

## 2014-01-16 ENCOUNTER — Other Ambulatory Visit: Payer: Self-pay | Admitting: Internal Medicine

## 2014-01-20 ENCOUNTER — Other Ambulatory Visit: Payer: Self-pay | Admitting: Internal Medicine

## 2014-01-20 ENCOUNTER — Other Ambulatory Visit: Payer: Self-pay

## 2014-01-20 MED ORDER — GLUCOSE BLOOD VI STRP: ORAL_STRIP | Status: DC

## 2014-01-20 NOTE — Telephone Encounter (Signed)
Pt called for refill on contour test strips, rf sent in to pharmacy

## 2014-01-26 ENCOUNTER — Other Ambulatory Visit: Payer: Self-pay | Admitting: Internal Medicine

## 2014-02-05 ENCOUNTER — Telehealth: Payer: Self-pay

## 2014-02-05 ENCOUNTER — Ambulatory Visit: Payer: Medicare HMO | Admitting: "Endocrinology

## 2014-02-05 NOTE — Telephone Encounter (Signed)
PA request for diabetic supplies

## 2014-02-09 ENCOUNTER — Telehealth: Payer: Self-pay | Admitting: Internal Medicine

## 2014-02-09 DIAGNOSIS — L989 Disorder of the skin and subcutaneous tissue, unspecified: Secondary | ICD-10-CM

## 2014-02-09 NOTE — Telephone Encounter (Signed)
Patient called requesting a [employer] referral to see Dr. Fontaine No for a lesion that has come up on her face and she needs referral for insurance purposes. Please enter new referral if appropriate and we will process this.

## 2014-02-09 NOTE — Telephone Encounter (Signed)
done

## 2014-02-10 NOTE — Telephone Encounter (Signed)
Formed faxed 02/10/2014 at 1:45pm

## 2014-02-12 ENCOUNTER — Telehealth: Payer: Self-pay | Admitting: Cardiology

## 2014-02-12 NOTE — Telephone Encounter (Signed)
LMOVM for pt to send manual carelink smart manual transmission due to upgrades.

## 2014-02-16 ENCOUNTER — Telehealth: Payer: Self-pay | Admitting: Internal Medicine

## 2014-02-16 NOTE — Telephone Encounter (Signed)
New message         Pt is returning barbara's call concerning her transmission

## 2014-02-17 NOTE — Telephone Encounter (Signed)
LMOVM with direct line # to return my call.

## 2014-02-19 ENCOUNTER — Other Ambulatory Visit: Payer: Self-pay | Admitting: Internal Medicine

## 2014-02-19 NOTE — Telephone Encounter (Signed)
Spoke with pt about carelink smart. Pt is aware that transmission needs to be done by 8-1 in order for monitor to received updates. Pt daughter was going to set this up on her phone and has not been able to do that yet. Pt is going to talk with pt daughter and call me back and let me know if she will be able to send transmission through [place].

## 2014-02-23 ENCOUNTER — Other Ambulatory Visit: Payer: Self-pay

## 2014-02-23 MED ORDER — METFORMIN HCL ER 500 MG PO TB24: 500.0000 mg | ORAL_TABLET | Freq: Every day | ORAL | Status: DC

## 2014-03-23 ENCOUNTER — Telehealth: Payer: Self-pay | Admitting: *Deleted

## 2014-03-23 MED ORDER — BD ULTRA-FINE LANCETS MISC: Status: DC

## 2014-03-23 MED ORDER — LORATADINE 10 MG PO TABS: ORAL_TABLET | ORAL | Status: DC

## 2014-03-23 MED ORDER — MICROLET LANCETS MISC: Status: DC

## 2014-03-23 NOTE — Telephone Encounter (Signed)
Left smg on triage needing new rx sent to [facility] on her loratadine & BD lancets. Notified pt rx's hs been sent over to [facility]...[name]

## 2014-04-02 ENCOUNTER — Other Ambulatory Visit: Payer: Self-pay

## 2014-04-06 ENCOUNTER — Other Ambulatory Visit: Payer: Self-pay

## 2014-04-06 ENCOUNTER — Telehealth: Payer: Self-pay | Admitting: Internal Medicine

## 2014-04-06 MED ORDER — LOSARTAN POTASSIUM 50 MG PO TABS: 50.0000 mg | ORAL_TABLET | Freq: Every day | ORAL | Status: DC

## 2014-04-06 NOTE — Telephone Encounter (Signed)
Pt called in said that she is out of Losartan 5mg  and she needed a refill but she wasn't sure if she needed to come in for a fu before it was refilled? Best number to reach her is [phone].    Thank you!

## 2014-04-15 ENCOUNTER — Other Ambulatory Visit: Payer: Self-pay | Admitting: *Deleted

## 2014-04-15 ENCOUNTER — Encounter: Payer: Self-pay | Admitting: "Endocrinology

## 2014-04-15 ENCOUNTER — Ambulatory Visit (INDEPENDENT_AMBULATORY_CARE_PROVIDER_SITE_OTHER): Payer: Commercial Managed Care - HMO | Admitting: "Endocrinology

## 2014-04-15 VITALS — BP 108/76 | HR 71 | Wt 121.0 lb

## 2014-04-15 DIAGNOSIS — IMO0001 Reserved for inherently not codable concepts without codable children: Secondary | ICD-10-CM

## 2014-04-15 DIAGNOSIS — R251 Tremor, unspecified: Secondary | ICD-10-CM

## 2014-04-15 DIAGNOSIS — E1165 Type 2 diabetes mellitus with hyperglycemia: Secondary | ICD-10-CM

## 2014-04-15 DIAGNOSIS — E559 Vitamin D deficiency, unspecified: Secondary | ICD-10-CM

## 2014-04-15 DIAGNOSIS — R7309 Other abnormal glucose: Secondary | ICD-10-CM

## 2014-04-15 DIAGNOSIS — E063 Autoimmune thyroiditis: Secondary | ICD-10-CM

## 2014-04-15 DIAGNOSIS — E211 Secondary hyperparathyroidism, not elsewhere classified: Secondary | ICD-10-CM

## 2014-04-15 DIAGNOSIS — Z23 Encounter for immunization: Secondary | ICD-10-CM

## 2014-04-15 DIAGNOSIS — R259 Unspecified abnormal involuntary movements: Secondary | ICD-10-CM

## 2014-04-15 DIAGNOSIS — I10 Essential (primary) hypertension: Secondary | ICD-10-CM

## 2014-04-15 DIAGNOSIS — E049 Nontoxic goiter, unspecified: Secondary | ICD-10-CM

## 2014-04-15 DIAGNOSIS — E038 Other specified hypothyroidism: Secondary | ICD-10-CM

## 2014-04-15 DIAGNOSIS — E7800 Pure hypercholesterolemia, unspecified: Secondary | ICD-10-CM

## 2014-04-15 LAB — POCT GLYCOSYLATED HEMOGLOBIN (HGB A1C): Hemoglobin A1C: 5.6

## 2014-04-15 LAB — T3, FREE: T3, Free: 2.7 pg/mL (ref 2.3–4.2)

## 2014-04-15 LAB — GLUCOSE, POCT (MANUAL RESULT ENTRY): POC Glucose: 117 mg/dl — AB (ref 70–99)

## 2014-04-15 LAB — TSH: TSH: 3.48 u[IU]/mL (ref 0.350–4.500)

## 2014-04-15 LAB — T4, FREE: Free T4: 1.28 ng/dL (ref 0.80–1.80)

## 2014-04-15 NOTE — Patient Instructions (Signed)
Follow up visit in 6 months. Please have fasting lab tests drawn 1-2 weeks prior to her next visit.

## 2014-04-15 NOTE — Progress Notes (Signed)
Subjective:  Patient Name: Stacy [name] Date of Birth: [birth year]  MRN: [id number]  Sierra Spargo  presents to the office today for follow-up of her T2DM, hypothyroidism, thyroiditis, hypertension, fatigue, secondary hyperparathyroidism, vitamin D deficiency, hyperlipidemia, tobacco abuse, and tremor.  HISTORY OF PRESENT ILLNESS:   Stacy [name] is a 73 y.o. Caucasian woman.  [name] was unaccompanied.  1. Patient was first referred to me on 09/04/05 for evaluation and management of chronic hypothyroidism and new hypercalcemia and hyperparathyroidism. In brief, she developed vitamin D deficiency, then secondary hyperparathyroidism, and then tertiary hyperparathyroidism. Her right inferior parathyroid gland was quite enlarged. She was taken to surgery on 12/07/05 for parathyroidectomy. Since that time she's done well, provided that she has continued to take her calcium and vitamin D.  She has had some waxing and waning of her thyroid gland size during these years, which is consistent with inflammatory flare-ups of her Hashimoto's disease. We've increased her thyroid hormone dose slightly over time. She now takes 100 mcg of Synthroid daily. She has remained euthyroid most of the time.   2. The patient has experienced many other problems during the last 8 years, to include bipolar disorder, GERD, fatigue, iron deficiency, 2 heart attacks, hypertension, orthostatic hypotension, atrial fibrillation, and the need for a cardiac pacemaker. We've helped to manage some of those problems and kept her as supplied as we could with samples of antihypertensive medications and Synthroid. She was diagnosed with type 2 diabetes in early 2012 and was started on metformin 500 mg once daily. She was also started on atorvastatin for hyperlipidemia.  3. The patient's last [facility] visit was on 11/06/13. In the interim, she has been healthy and has been doing well emotionally.  Her memory is "not worth a flip" at times for names, but is  good for other tasks. She is taking Synthroid, 100 mcg/day and metformin, 500 mg, once daily. She still takes propranolol every day to attempt to control her tremor, but her dose was reduced from 80 mg/day to 40 mg/day.  She also takes atorvastatin, calcium carbonate, fluoxetine, lithium, losartan, and solifenacin. She has intentionally reduced her intake of sweets and other carbs, to include honey. She has not had any severe problems with vertigo recently. Her allergies have bothered her somewhat more in the past 2 months.    4. Pertinent Review of Systems:  Constitutional: The patient says, "I feel pretty good.". Her energy level is variable. Her attitude has been good.  Eyes: Vision is good as long as she wears her glasses. She had her last eye exam about 3 months ago. There are no other significant eye complaints. Neck: The patient has no complaints of anterior neck swelling, soreness, tenderness,  pressure, discomfort, or difficulty swallowing.  Heart: Her pacemaker is still working. She has no complaints of palpitations, irregular heat beats, chest pain, or chest pressure. Gastrointestinal: Her stomach "growls a lot". Bowel movements have normalized for the most part, but sometimes she is constipated. The patient has no complaints of acid reflux, upset stomach, stomach aches or pains. Arms: She occasionally still has pain in her shoulders (trapezius areas) when sitting, standing, or lying on her sides.  Legs: She has occasional pains in her left hip and left knee. Muscle mass and strength seem normal. There are no complaints of numbness, tingling, or burning. No edema is noted. Feet: There are no obvious foot problems. There are no complaints of numbness, tingling, burning, or pain. No edema is noted. Hypoglycemia: None  5. BG printout:  We were unable to print out her old meter today. We gave her anew Verio meter today.  PAST MEDICAL, FAMILY, AND SOCIAL HISTORY:  Past Medical History   Diagnosis Date  . Chronic bipolar disorder   . Coronary artery disease     s/p PTCA  . Complete AV block     s/p PPM--[employer] ADAPTA [id number]  . Takotsubo syndrome   . GERD (gastroesophageal reflux disease)   . COPD (chronic obstructive pulmonary disease)     TOBACCO ABUSE  . Abscess of liver(572.0)   . Atrial fibrillation     chronic anticoag - pradaxa  . BIPOLAR AFFECTIVE DISORDER   . DEPRESSION   . DIABETES MELLITUS, TYPE II dx 04/2010  . DYSLIPIDEMIA   . HYPERTENSION   . HYPOTHYROIDISM     hashimoto's  . TOBACCO ABUSE   . Pacemaker-Medtronic-dual-chamber   . Parathyroid related hypercalcemia   . Primary hyperparathyroidism   . Vitamin D deficiency disease   . Cancer     basal cell on abdomen    Family History  Problem Relation Age of Onset  . CAD Mother   . Colon cancer      FH  . Diabetes Maternal Aunt   . CAD Father   . Diabetes type II Sister     Current outpatient prescriptions:atorvastatin (LIPITOR) 80 MG tablet, Take 80 mg by mouth daily at 6 PM. , Disp: , Rfl: ;  Calcium Carbonate (CALCIUM 500 PO), Take 1 tablet by mouth at bedtime. , Disp: , Rfl: ;  donepezil (ARICEPT) 10 MG tablet, Take 1 tablet (10 mg total) by mouth at bedtime., Disp: 90 tablet, Rfl: 3;  FLUoxetine (PROZAC) 20 MG capsule, Take 20 mg by mouth daily before breakfast., Disp: , Rfl:  levothyroxine (SYNTHROID, LEVOTHROID) 100 MCG tablet, Take 100 mcg by mouth daily before breakfast., Disp: , Rfl: ;  lithium carbonate 300 MG capsule, Take 300 mg by mouth daily., Disp: , Rfl: ;  losartan (COZAAR) 50 MG tablet, Take 1 tablet (50 mg total) by mouth daily before breakfast., Disp: 90 tablet, Rfl: 1;  metFORMIN (GLUCOPHAGE-XR) 500 MG 24 hr tablet, Take 1 tablet (500 mg total) by mouth daily with breakfast., Disp: 90 tablet, Rfl: 3 Multiple Vitamin (MULTIVITAMIN) tablet, Take 1 tablet by mouth at bedtime. , Disp: , Rfl: ;  omeprazole (PRILOSEC) 40 MG capsule, Take 1 capsule (40 mg total) by mouth daily  before breakfast., Disp: 90 capsule, Rfl: 3;  PRADAXA 150 MG CAPS capsule, TAKE 1 CAPSULE EVERY 12 HOURS, Disp: 180 capsule, Rfl: 1;  propranolol (INDERAL) 40 MG tablet, Take 1 tablet (40 mg total) by mouth daily., Disp: 90 tablet, Rfl: 3 Propylene Glycol (SYSTANE BALANCE OP), Apply 1 drop to eye at bedtime., Disp: , Rfl: ;  solifenacin (VESICARE) 5 MG tablet, Take 5 mg by mouth daily., Disp: , Rfl: ;  VESICARE 5 MG tablet, take 1 tablet by mouth once daily, Disp: 30 tablet, Rfl: 5;  ACCU-CHEK FASTCLIX LANCETS MISC, 1 each by Does not apply route 2 (two) times daily. USE TO HELP CHECK BLOOD SUGAR TWICE A DAY DX 250.00, Disp: 180 each, Rfl: 3 azelastine (ASTELIN) 137 MCG/SPRAY nasal spray, Place 2 sprays into both nostrils 2 (two) times daily. Use in each nostril as directed, Disp: 30 mL, Rfl: 2;  BAYER MICROLET LANCETS lancets, Use as instructed to check blood sugar once daily dx 250.00, Disp: 100 each, Rfl: 3;  BD ULTRA-FINE LANCETS lancets, Use TO CHECK BLOOD SUGARS TWICE A DAY  DX 250.00, Disp: 100 each, Rfl: 3 benzonatate (TESSALON) 100 MG capsule, Take 1 capsule (100 mg total) by mouth 3 (three) times daily as needed for cough., Disp: 40 capsule, Rfl: 0;  Blood Glucose Calibration (ACCU-CHEK SMARTVIEW CONTROL) LIQD, USE AD DIRECTED, Disp: 3 each, Rfl: 0;  Blood Glucose Monitoring Suppl (ACCU-CHEK NANO SMARTVIEW) W/DEVICE KIT, USE TO CHECK BLOOD SUGARS TWICE A DAY DX 250.00, Disp: 1 kit, Rfl: 0 desoximetasone (TOPICORT) 0.05 % cream, Apply 1 application topically 2 (two) times daily as needed (itching)., Disp: , Rfl: ;  doxycycline (VIBRAMYCIN) 100 MG capsule, Take 1 capsule (100 mg total) by mouth 2 (two) times daily., Disp: 20 capsule, Rfl: 0;  glucose blood (ACCU-CHEK SMARTVIEW) test strip, DX 250.00, Disp: 180 each, Rfl: 3;  glucose blood test strip, USE TO CHECK BLOOD SUGAR ONCE DAILY, Disp: 100 each, Rfl: 3 loratadine (CLARITIN) 10 MG tablet, take 1 tablet by mouth once daily, Disp: 30 tablet, Rfl: 11;   MICROLET LANCETS MISC, USE AS DIRECTED TO TEST BLOOD SUGAR DAILY DX 250.00, Disp: 100 each, Rfl: 3  Allergies as of 04/15/2014 - Review Complete 04/15/2014  Allergen Reaction Noted  . Codeine    . Latex Other (See Comments) 05/03/2012  . Metronidazole Other (See Comments) 03/26/2008  . Other  01/24/2013  . Penicillins      1. Work and Family: Her daughter, [name], lives with her. [name] is in nursing school. 2. Activities: Ara has not been walking much in the past few months.     3. Smoking, alcohol, or drugs: She stopped smoking, but uses a nicotine-free e-cigarette.   4. Primary Care Provider: Gwendolyn Grant, MD  REVIEW OF SYSTEMS: There are no other significant problems involving Barbar's other body systems.   Objective:  Vital Signs:  BP 108/76  Pulse 71  Wt 121 lb (54.885 kg)   Ht Readings from Last 3 Encounters:  01/07/14 5' 5.5" (1.664 m)  12/19/13 _0  (1.676 m)  11/10/13 _1  (1.676 m)   Wt Readings from Last 3 Encounters:  04/15/14 121 lb (54.885 kg)  01/07/14 123 lb (55.792 kg)  12/19/13 127 lb (57.607 kg)   PHYSICAL EXAM:  Constitutional: The patient appears healthy and well nourished.  Her weight has decreased by 5 pounds in 5 months. Her mood and affect are normal. Her insight is good. This is probably the best that she's looked in 8 years. Head: Her head tremor is still present, but is less since starting propranolol.  Face: The face appears normal.  Eyes: There is no obvious arcus or proptosis. Moisture appears normal. Mouth: The oropharynx and tongue appear normal. Oral moisture is normal. Neck: The neck appears to be visibly normal. No carotid bruits are noted. She has a low-lying thyroid gland. The thyroid gland is within normal size at 18-20 grams. Both lobes today are within normal limits for size. The consistency of the thyroid gland is normal. The thyroid gland is not tender to palpation. Lungs: The lungs are clear to auscultation. Air  movement is good. Heart: Heart rate and rhythm are regular. Heart sounds S1 and S2 are normal. I did not appreciate any pathologic cardiac murmurs. Abdomen: The abdomen is mildly enlarged. Bowel sounds are normal. There is no obvious hepatomegaly, splenomegaly, or other mass effect.  Arms: Muscle size and bulk are normal for age. She has no cog-wheeling of her arms when I passively flex and extend her arms at the elbows. Hands: She has a 2+ hand tremor. Phalangeal  and metacarpophalangeal joints are normal. Palmar muscles are normal. Palmar skin is normal. Palmar moisture is also normal. Fingernails are somewhat pale. Legs: Muscles appear normal for age. No edema is present. Feet: She has a 1+ dorsalis pedis pulses bilaterally.  Neurologic: Strength is normal for age in both the upper and lower extremities. Muscle tone is normal. Sensation to touch is normal in both legs and feet. She walked normally, without any of the hesitation and rigidity usually seen with Parkinson's disease.   LAB DATA: Hemoglobin A1c was 5.6% today, compared with 6.1% at last visit and 5.4% at the visit prior.    Labs 12/15/13: TSH 4.984, free T4   Labs 11/05/13: Cholesterol 117, triglycerides 112, HDL 63.5, LDL 31; TSH 4.84  Labs 05/05/13: TSH 3.575, free T4 1.46, free T3 2.5  Labs 01/30/13: Cholesterol 93, triglycerides 61, HDL 56, LDL 24  Labs 10/30/12: TSH 2.550, free T4 1.24, free T3 2.5; calcium 10.4, PTH 36.3, 24-hydroxy vitamin D 42; lithium 0.80 (0.80-1.40)   Labs 04/18/12: TSH was 2.106, Free T4 was 1.41. Free T3 was 2.8.                   Labs 10/25/11: PTH 30.8, calcium 10.4, 25-hydroxy vitamin D 43; WBC 7.8, hemoglobin 14.9, hematocrit 46.9%, iron 71             Assessment and Plan:   ASSESSMENT:  1&2. Hypothyroidism/ Hashimoto's thyroiditis:   A. Her TFTS in September 2014 , at first glance, did not make sense. This pattern was c/w a recent flare up of Hashimoto's Dz. We could not make any decisions about  whether or not to change her Synthroid dose until the TFTs stabilized.   B. Her TFTs in April 2015 were mildly low. Since she was in the midst of an episode of lithium toxicity and may have missed some Synthroid doses, I decided not to change her Synthroid dose.    C. Her thyroiditis is again clinically quiescent  3. Hypertension: Her blood pressure is very good today.    4. T2DM: Her HbA1c is now within normal limits. Her reduction in carb intake and her continuing to take metformin have worked together to improve her BG control. We do not need to increase her metformin to twice daily.   5.  Fatigue: She is really feeling very well. 6.  Hyperparathyroidism and vitamin D deficiency: Her labs in March 2014 were normal. She was very compliant with taking her Caltrate-D and MVI each evening. We should re-check these labs before her next visit.  7. Tobacco abuse: She stopped smoking, but instead is using a "nicotine-free" e-cigarette. 8. Tremor: Her tremor is not due to Parkinson's Dz. She is doing better with propranolol. 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 9. Pallor: Her pallor has improved over time. Her iron level, hemoglobin, and hematocrit were fine 2 years ago.  10. Goiter: Her thyroid gland is again within normal size today.  11. Hyperlipidemia: Her lipids were very good this past April .  PLAN:  1. Diagnostic:  TFTs now. TFTs, calcium, PTH, lithium, fasting lipid panel, and 25-hydroxy vitamin D prior to next visit.  2. Therapeutic:  Eat Right, exercise right (walk 30-60 minutes per day), and take your Synthroid, calcium, and metformin.  3. Patient education: We talked a lot about her tremor, her smoking, and her thyroiditis and hypothyroidism. 4. Follow-up: 6 months with me. Please schedule FU visits with Dr. Asa Lente as she desires.  Level of Service:  This visit lasted in excess of 50 minutes. More than 50% of the visit was devoted to counseling.   Sherrlyn Hock,  MD 04/15/2014 10:04 AM

## 2014-04-16 ENCOUNTER — Other Ambulatory Visit: Payer: Self-pay | Admitting: *Deleted

## 2014-04-16 DIAGNOSIS — IMO0001 Reserved for inherently not codable concepts without codable children: Secondary | ICD-10-CM

## 2014-04-16 DIAGNOSIS — E1165 Type 2 diabetes mellitus with hyperglycemia: Principal | ICD-10-CM

## 2014-04-16 MED ORDER — ACCU-CHEK FASTCLIX LANCETS MISC: 1.0000 | Freq: Two times a day (BID) | Status: DC

## 2014-04-17 ENCOUNTER — Encounter: Payer: Self-pay | Admitting: Neurology

## 2014-04-17 ENCOUNTER — Ambulatory Visit (INDEPENDENT_AMBULATORY_CARE_PROVIDER_SITE_OTHER): Payer: Commercial Managed Care - HMO | Admitting: Neurology

## 2014-04-17 VITALS — BP 122/70 | HR 88 | Temp 98.1°F | Resp 18 | Wt 122.7 lb

## 2014-04-17 DIAGNOSIS — Z5181 Encounter for therapeutic drug level monitoring: Secondary | ICD-10-CM

## 2014-04-17 DIAGNOSIS — R251 Tremor, unspecified: Secondary | ICD-10-CM | POA: Insufficient documentation

## 2014-04-17 DIAGNOSIS — R55 Syncope and collapse: Secondary | ICD-10-CM

## 2014-04-17 DIAGNOSIS — R259 Unspecified abnormal involuntary movements: Secondary | ICD-10-CM

## 2014-04-17 NOTE — Patient Instructions (Signed)
1. Stop Propranolol.  2. Your provider has requested that you have labwork completed today. Please go to [facility] on the first floor of this building before leaving the office today. We will call you if these are abnormal.  3. Call us if you need [place].
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
## 2014-04-20 ENCOUNTER — Encounter: Payer: Self-pay | Admitting: Internal Medicine

## 2014-04-29 ENCOUNTER — Ambulatory Visit (INDEPENDENT_AMBULATORY_CARE_PROVIDER_SITE_OTHER): Payer: Commercial Managed Care - HMO | Admitting: Internal Medicine

## 2014-04-29 ENCOUNTER — Encounter: Payer: Self-pay | Admitting: Internal Medicine

## 2014-04-29 VITALS — BP 147/82 | HR 82 | Ht 66.0 in | Wt 124.2 lb

## 2014-04-29 DIAGNOSIS — I4891 Unspecified atrial fibrillation: Secondary | ICD-10-CM

## 2014-04-29 DIAGNOSIS — R55 Syncope and collapse: Secondary | ICD-10-CM

## 2014-04-29 DIAGNOSIS — I442 Atrioventricular block, complete: Secondary | ICD-10-CM

## 2014-04-29 DIAGNOSIS — Z950 Presence of cardiac pacemaker: Secondary | ICD-10-CM

## 2014-04-29 DIAGNOSIS — I480 Paroxysmal atrial fibrillation: Secondary | ICD-10-CM

## 2014-04-29 NOTE — Patient Instructions (Addendum)
Your physician recommends that you continue on your current medications as directed. Please refer to the Current Medication list given to you today.  Remote monitoring is used to monitor your pacemaker from home. This monitoring reduces the number of office visits required to check your device to one time per year. It allows [place] to keep an eye on the functioning of your device to ensure it is working properly. You are scheduled for a device check from home on 08-03-2014. You may send your transmission at any time that day. If you have a wireless device, the transmission will be sent automatically. After your physician reviews your transmission, you will receive a postcard with your next transmission date.  Your physician recommends that you schedule a follow-up appointment in: 12 months with Dr.Klein
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
## 2014-04-30 LAB — MDC_IDC_ENUM_SESS_TYPE_INCLINIC
Brady Statistic AP VP Percent: 17 %
Brady Statistic AP VS Percent: 74 %
Brady Statistic AS VP Percent: 0 %
Date Time Interrogation Session: 20150923131941
Lead Channel Pacing Threshold Amplitude: 0.5 V
Lead Channel Pacing Threshold Amplitude: 1 V
Lead Channel Pacing Threshold Pulse Width: 0.4 ms
Lead Channel Pacing Threshold Pulse Width: 0.4 ms
Lead Channel Sensing Intrinsic Amplitude: 11.2 mV
Lead Channel Sensing Intrinsic Amplitude: 4 mV
Lead Channel Setting Pacing Pulse Width: 0.4 ms
Lead Channel Setting Sensing Sensitivity: 5.6 mV
MDC IDC MSMT BATTERY IMPEDANCE: 2557 Ohm
MDC IDC MSMT BATTERY REMAINING LONGEVITY: 19 mo
MDC IDC MSMT BATTERY VOLTAGE: 2.71 V
MDC IDC MSMT LEADCHNL RA IMPEDANCE VALUE: 555 Ohm
MDC IDC MSMT LEADCHNL RV IMPEDANCE VALUE: 1093 Ohm
MDC IDC SET LEADCHNL RA PACING AMPLITUDE: 2 V
MDC IDC SET LEADCHNL RV PACING AMPLITUDE: 2.5 V
MDC IDC STAT BRADY AS VS PERCENT: 9 %

## 2014-05-01 ENCOUNTER — Other Ambulatory Visit: Payer: Self-pay | Admitting: *Deleted

## 2014-05-01 ENCOUNTER — Telehealth: Payer: Self-pay | Admitting: *Deleted

## 2014-05-01 DIAGNOSIS — E038 Other specified hypothyroidism: Secondary | ICD-10-CM

## 2014-05-01 MED ORDER — LEVOTHYROXINE SODIUM 112 MCG PO TABS: 112.0000 ug | ORAL_TABLET | Freq: Every day | ORAL | Status: DC

## 2014-05-01 NOTE — Telephone Encounter (Signed)
Spoke to daughter, advised that per Dr. Tobe Sos TFTs were still mildly low on 04/15/14. She needs to increase her levothyroxine dose to 112 mcg/day. Script sent to pharamcy. KW

## 2014-05-04 ENCOUNTER — Encounter: Payer: Self-pay | Admitting: Internal Medicine

## 2014-05-04 ENCOUNTER — Telehealth: Payer: Self-pay | Admitting: Internal Medicine

## 2014-05-04 ENCOUNTER — Other Ambulatory Visit: Payer: Self-pay

## 2014-05-04 MED ORDER — ATORVASTATIN CALCIUM 80 MG PO TABS: 80.0000 mg | ORAL_TABLET | Freq: Every day | ORAL | Status: DC

## 2014-05-04 NOTE — Telephone Encounter (Signed)
Patient has switched pharmacy to [facility].  Patient needs script for atorvastatin sent in to pharmacy to fill.

## 2014-05-18 ENCOUNTER — Other Ambulatory Visit: Payer: Self-pay | Admitting: *Deleted

## 2014-05-18 ENCOUNTER — Telehealth: Payer: Self-pay | Admitting: "Endocrinology

## 2014-05-18 DIAGNOSIS — E1110 Type 2 diabetes mellitus with ketoacidosis without coma: Secondary | ICD-10-CM

## 2014-05-18 LAB — LITHIUM LEVEL: Lithium Lvl: 0.5 mEq/L — ABNORMAL LOW (ref 0.80–1.40)

## 2014-05-18 LAB — BASIC METABOLIC PANEL
BUN: 14 mg/dL (ref 6–23)
CHLORIDE: 105 meq/L (ref 96–112)
CO2: 27 meq/L (ref 19–32)
CREATININE: 0.94 mg/dL (ref 0.50–1.10)
Calcium: 10.2 mg/dL (ref 8.4–10.5)
Glucose, Bld: 106 mg/dL — ABNORMAL HIGH (ref 70–99)
Potassium: 3.7 mEq/L (ref 3.5–5.3)
Sodium: 143 mEq/L (ref 135–145)

## 2014-05-18 MED ORDER — GLUCOSE BLOOD VI STRP: ORAL_STRIP | Status: DC

## 2014-05-18 NOTE — Telephone Encounter (Signed)
Sent via escribe. KW 

## 2014-05-25 ENCOUNTER — Other Ambulatory Visit: Payer: Self-pay

## 2014-05-25 MED ORDER — BLOOD GLUCOSE METER KIT: PACK | Status: DC

## 2014-06-02 ENCOUNTER — Telehealth: Payer: Self-pay | Admitting: "Endocrinology

## 2014-06-02 ENCOUNTER — Other Ambulatory Visit: Payer: Self-pay

## 2014-06-02 ENCOUNTER — Other Ambulatory Visit: Payer: Self-pay | Admitting: *Deleted

## 2014-06-02 DIAGNOSIS — R7303 Prediabetes: Secondary | ICD-10-CM

## 2014-06-02 MED ORDER — GLUCOSE BLOOD VI STRP: ORAL_STRIP | Status: DC

## 2014-06-02 MED ORDER — LANCETS 28G MISC: Status: DC

## 2014-06-02 NOTE — Telephone Encounter (Signed)
Script sent to pharmacy via escribe. KW 

## 2014-06-03 ENCOUNTER — Other Ambulatory Visit: Payer: Self-pay | Admitting: *Deleted

## 2014-06-03 ENCOUNTER — Telehealth: Payer: Self-pay | Admitting: "Endocrinology

## 2014-06-03 DIAGNOSIS — E1165 Type 2 diabetes mellitus with hyperglycemia: Secondary | ICD-10-CM

## 2014-06-03 DIAGNOSIS — IMO0002 Reserved for concepts with insufficient information to code with codable children: Secondary | ICD-10-CM

## 2014-06-03 MED ORDER — GLUCOSE BLOOD VI STRP: ORAL_STRIP | Status: DC

## 2014-06-03 NOTE — Telephone Encounter (Signed)
Spoke to Stacy [name], she advises that her insurance will only pay for the accuchek strips. She has a nano so I sent smartview strips to her pharmacy. KW

## 2014-06-15 ENCOUNTER — Other Ambulatory Visit: Payer: Self-pay | Admitting: Internal Medicine

## 2014-06-17 ENCOUNTER — Telehealth: Payer: Self-pay | Admitting: *Deleted

## 2014-06-17 NOTE — Telephone Encounter (Signed)
Pt will bring in the paper work.

## 2014-06-17 NOTE — Telephone Encounter (Signed)
Left msg on triage wanting to see if md would sign for her to renew her handi-cap sticker...Stacy [name]

## 2014-06-24 ENCOUNTER — Other Ambulatory Visit: Payer: Self-pay | Admitting: Internal Medicine

## 2014-06-28 ENCOUNTER — Other Ambulatory Visit: Payer: Self-pay | Admitting: Internal Medicine

## 2014-06-29 ENCOUNTER — Telehealth: Payer: Self-pay | Admitting: Internal Medicine

## 2014-06-29 NOTE — Telephone Encounter (Signed)
Walk in pt Form " Disability Pla-Card" Dropped Off gave to Sherri   11.23.15/km

## 2014-07-01 NOTE — Telephone Encounter (Signed)
Paperwork completed and signed by Dr. Caryl Comes. Informed patient left at front desk for her pick up. She verbalized understanding.

## 2014-07-20 ENCOUNTER — Other Ambulatory Visit: Payer: Self-pay | Admitting: Neurology

## 2014-07-20 NOTE — Telephone Encounter (Signed)
ok 

## 2014-07-20 NOTE — Telephone Encounter (Signed)
ARICEPT refill requested. Per last office note- patient to remain on medication. Refill approved and sent to patient's pharmacy.

## 2014-07-20 NOTE — Telephone Encounter (Signed)
PRN follow up, but just seen 04/2014. Okay to fill?

## 2014-08-01 ENCOUNTER — Other Ambulatory Visit: Payer: Self-pay | Admitting: Internal Medicine

## 2014-08-03 ENCOUNTER — Encounter: Payer: Commercial Managed Care - HMO | Admitting: *Deleted

## 2014-08-03 ENCOUNTER — Telehealth: Payer: Self-pay | Admitting: Internal Medicine

## 2014-08-03 ENCOUNTER — Ambulatory Visit (INDEPENDENT_AMBULATORY_CARE_PROVIDER_SITE_OTHER): Payer: Commercial Managed Care - HMO | Admitting: Emergency Medicine

## 2014-08-03 VITALS — BP 152/82 | HR 76 | Temp 97.4°F | Resp 16 | Ht 65.0 in | Wt 125.0 lb

## 2014-08-03 DIAGNOSIS — T161XXA Foreign body in right ear, initial encounter: Secondary | ICD-10-CM

## 2014-08-03 NOTE — Telephone Encounter (Signed)
Spoke w/ pt daughter and informed her to try to [facility] and re install app for my carelink smart. Informed pt daughter that if this does not work to try [facility]. I informed pt daughter if they are still unable to send transmission after that to call office and back and schedule an appt for Tuesday 12-29 to have pacemaker checked. She verbalized understanding.

## 2014-08-03 NOTE — Progress Notes (Signed)
[facility] [address], [place] [address] [phone]  Date:  08/03/2014   Name:  Stacy [name]   DOB:  1940/09/02   MRN:  [id number]  PCP:  Gwendolyn Grant, MD    Chief Complaint: Foreign Body in Ear   History of Present Illness:  Stacy [name] is a 73 y.o. very pleasant female patient who presents with the following:  Insect in right ear since Thursday. Killed with olive oil.  Still has foreign body sensation. No fever or chills.  No pain No cough or coryza. No improvement with over the counter medications or other home remedies. Denies other complaint or health concern today.   Patient Active Problem List   Diagnosis Date Noted  . Tremor 04/17/2014  . Allergic rhinitis 08/04/2013  . Hypothyroidism, acquired, autoimmune 05/10/2013  . OAB (overactive bladder)   . Parathyroid related hypercalcemia   . Primary hyperparathyroidism   . Vitamin D deficiency disease   . Thyroiditis, autoimmune   . Pacemaker-Medtronic-dual-chamber 11/17/2010  . DIABETES MELLITUS, TYPE II 09/08/2010  . TREMOR 01/17/2010  . ATRIAL FIBRILLATION 11/11/2009  . DEPRESSION 08/11/2009  . ABSCESS OF LIVER 08/11/2009  . TOBACCO ABUSE 03/22/2009  . CORONARY ARTERY DISEASE, S/P PTCA 03/26/2008  . AV block-complete-intermittent 03/26/2008  . HYPOTHYROIDISM 02/25/2008  . DYSLIPIDEMIA 02/25/2008  . BIPOLAR AFFECTIVE DISORDER 02/25/2008  . HYPERTENSION 02/25/2008  . DIVERTICULOSIS, COLON 02/25/2008    Past Medical History  Diagnosis Date  . Chronic bipolar disorder   . Coronary artery disease     s/p PTCA  . Complete AV block     s/p PPM--MEDTRONIC ADAPTA ADDr01  . Takotsubo syndrome   . GERD (gastroesophageal reflux disease)   . COPD (chronic obstructive pulmonary disease)     TOBACCO ABUSE  . Abscess of liver(572.0)   . Atrial fibrillation     chronic anticoag - pradaxa  . BIPOLAR AFFECTIVE DISORDER   . DEPRESSION   . DIABETES MELLITUS, TYPE II dx  04/2010  . DYSLIPIDEMIA   . HYPERTENSION   . HYPOTHYROIDISM     hashimoto's  . TOBACCO ABUSE   . Pacemaker-Medtronic-dual-chamber   . Parathyroid related hypercalcemia   . Primary hyperparathyroidism   . Vitamin D deficiency disease   . Cancer     basal cell on abdomen    Past Surgical History  Procedure Laterality Date  . Insert / replace / remove pacemaker      MEDTRONIC ADAPTA ADDr01  . Parathyroidectomy      RIGHT INFERIOR  . Abdominal hysterectomy    . Cardiac catheterization    . Mass excision Left 01/27/2013    Procedure: EXCISION MASS LEFT FLANK;  Surgeon: Earnstine Regal, MD;  Location: [facility];  Service: General;  Laterality: Left;    History  Substance Use Topics  . Smoking status: Current Every Day Smoker -- 0.50 packs/day for 48 years    Types: Cigarettes  . Smokeless tobacco: Never Used     Comment: switched to vapor cigs. Widowed- 2 girls. Lives with daughter (who is nurse at [employer])  . Alcohol Use: No     Comment: "once in a blue moon" when I have [place] food    Family History  Problem Relation Age of Onset  . CAD Mother   . Colon cancer      FH  . Diabetes Maternal Aunt   . CAD Father   . Diabetes type II Sister  Allergies  Allergen Reactions  . Codeine     REACTION: vomiting  . Latex Other (See Comments)    irritation  . Metronidazole Other (See Comments)    Reaction unknown  . Other     Bandaids cause rash  . Penicillins     REACTION: throat swelling, rash    Medication list has been reviewed and updated.  Current Outpatient Prescriptions on File Prior to Visit  Medication Sig Dispense Refill  . ACCU-CHEK FASTCLIX LANCETS MISC 1 each by Does not apply route 2 (two) times daily. USE TO HELP CHECK BLOOD SUGAR TWICE A DAY DX 250.00 180 each 3  . atorvastatin (LIPITOR) 80 MG tablet Take 1 tablet (80 mg total) by mouth daily at 6 PM. 90 tablet 2  . azelastine (ASTELIN) 137 MCG/SPRAY nasal spray Place 2 sprays into both nostrils 2 (two)  times daily. Use in each nostril as directed 30 mL 2  . benzonatate (TESSALON) 100 MG capsule Take 1 capsule (100 mg total) by mouth 3 (three) times daily as needed for cough. 40 capsule 0  . Blood Glucose Calibration (ACCU-CHEK SMARTVIEW CONTROL) LIQD USE AD DIRECTED 3 each 0  . Blood Glucose Monitoring Suppl (ACCU-CHEK NANO SMARTVIEW) W/DEVICE KIT USE TO CHECK BLOOD SUGARS TWICE A DAY DX 250.00 1 kit 0  . Calcium Carbonate (CALCIUM 500 PO) Take 1 tablet by mouth at bedtime.     Marland Kitchen desoximetasone (TOPICORT) 0.05 % cream Apply 1 application topically 2 (two) times daily as needed (itching).    Marland Kitchen donepezil (ARICEPT) 10 MG tablet Take 1 tablet (10 mg total) by mouth at bedtime. 90 tablet 3  . donepezil (ARICEPT) 10 MG tablet TAKE ONE TABLET BY MOUTH AT BEDTIME 90 tablet 1  . FLUoxetine (PROZAC) 10 MG capsule Take 10 mg by mouth daily.    Marland Kitchen glucose blood (ACCU-CHEK SMARTVIEW) test strip Check sugar 2 x daily ICD10 code-E11.65 300 each 4  . Lancets 28G MISC Use with device to check blood sugar at least once a day 100 each 5  . levothyroxine (SYNTHROID, LEVOTHROID) 112 MCG tablet Take 1 tablet (112 mcg total) by mouth daily. 30 tablet 1  . lithium carbonate 300 MG capsule Take 300 mg by mouth daily.    Marland Kitchen loratadine (CLARITIN) 10 MG tablet take 1 tablet by mouth once daily 30 tablet 11  . losartan (COZAAR) 50 MG tablet TAKE 1 TABLET EVERY DAY  BEFORE  BREAKFAST 90 tablet 1  . metFORMIN (GLUCOPHAGE-XR) 500 MG 24 hr tablet Take 1 tablet (500 mg total) by mouth daily with breakfast. 90 tablet 3  . Multiple Vitamin (MULTIVITAMIN) tablet Take 1 tablet by mouth at bedtime.     Marland Kitchen omeprazole (PRILOSEC) 40 MG capsule Take 1 capsule (40 mg total) by mouth daily before breakfast. 90 capsule 3  . PRADAXA 150 MG CAPS capsule TAKE 1 CAPSULE EVERY 12 HOURS 180 capsule 1  . Propylene Glycol (SYSTANE BALANCE OP) Apply 1 drop to eye at bedtime.    . solifenacin (VESICARE) 5 MG tablet Take 5 mg by mouth daily.    .  VESICARE 5 MG tablet take 1 tablet by mouth once daily 30 tablet 5   No current facility-administered medications on file prior to visit.    Review of Systems:  As per HPI, otherwise negative.    Physical Examination: Filed Vitals:   08/03/14 1301  BP: 152/82  Pulse: 76  Temp: 97.4 F (36.3 C)  Resp: 16   Filed Vitals:  08/03/14 1301  Height: _0  (1.651 m)  Weight: 125 lb (56.7 kg)   Body mass index is 20.8 kg/(m^2). Ideal Body Weight: Weight in (lb) to have BMI = 25: 149.9   GEN: WDWN, NAD, Non-toxic, Alert & Oriented x 3 HEENT: Atraumatic, Normocephalic.  Ears and Nose: No external deformity. EXTR: No clubbing/cyanosis/edema NEURO: Normal gait.  PSYCH: Normally interactive. Conversant. Not depressed or anxious appearing.  Calm demeanor.    Assessment and Plan: fb right ear  Signed,  Ellison Carwin, MD   Large dead insect removed atraumatically right ear

## 2014-08-03 NOTE — Telephone Encounter (Signed)
New message     1. Has your device fired? no  2. Is you device beeping? No   3. Are you experiencing draining or swelling at device site? no  4. Are you calling to see if we received your device transmission? Yes - having phone trouble - saying to updates. Will not proceed .    5. Have you passed out? no

## 2014-08-04 ENCOUNTER — Other Ambulatory Visit: Payer: Self-pay | Admitting: Dermatology

## 2014-08-04 ENCOUNTER — Telehealth: Payer: Self-pay | Admitting: Internal Medicine

## 2014-08-04 NOTE — Telephone Encounter (Signed)
New Msg       Pt calling, not sure if pacer check was submitted, pt attempting to complete from daughter's phn.    Please contact pt at [phone] if after 2pm when call returned.

## 2014-08-04 NOTE — Telephone Encounter (Signed)
Informed pt that remote transmission was not received. Appt made for 12-30 for device clinic. Patient voiced understanding.

## 2014-08-05 ENCOUNTER — Encounter: Payer: Self-pay | Admitting: Cardiology

## 2014-08-05 ENCOUNTER — Ambulatory Visit (INDEPENDENT_AMBULATORY_CARE_PROVIDER_SITE_OTHER): Payer: Commercial Managed Care - HMO | Admitting: *Deleted

## 2014-08-05 DIAGNOSIS — I442 Atrioventricular block, complete: Secondary | ICD-10-CM

## 2014-08-05 LAB — MDC_IDC_ENUM_SESS_TYPE_INCLINIC

## 2014-08-05 NOTE — Progress Notes (Signed)
Pacemaker interrogation in clinic. Battery longevity 19 months with range of 7 to 31 months. Pt unable to use Carelink Smart at this time and requested Carelink 2490H. Ordered new monitor and return kit for pt. Carelink 11-04-14 and ROV in September with SK.

## 2014-09-02 ENCOUNTER — Encounter: Payer: Self-pay | Admitting: Internal Medicine

## 2014-09-19 ENCOUNTER — Other Ambulatory Visit: Payer: Self-pay | Admitting: Internal Medicine

## 2014-09-21 ENCOUNTER — Encounter: Payer: Commercial Managed Care - HMO | Admitting: Internal Medicine

## 2014-09-27 ENCOUNTER — Other Ambulatory Visit: Payer: Self-pay | Admitting: Internal Medicine

## 2014-09-28 ENCOUNTER — Other Ambulatory Visit: Payer: Self-pay | Admitting: Internal Medicine

## 2014-09-28 DIAGNOSIS — Z1231 Encounter for screening mammogram for malignant neoplasm of breast: Secondary | ICD-10-CM

## 2014-09-30 ENCOUNTER — Encounter: Payer: Commercial Managed Care - HMO | Admitting: Family

## 2014-10-01 ENCOUNTER — Other Ambulatory Visit: Payer: Self-pay | Admitting: Internal Medicine

## 2014-10-01 ENCOUNTER — Ambulatory Visit (HOSPITAL_COMMUNITY)
Admission: RE | Admit: 2014-10-01 | Discharge: 2014-10-01 | Disposition: A | Discharge status: Home / Self Care | Payer: Commercial Managed Care - HMO | Source: Ambulatory Visit | Attending: Internal Medicine | Admitting: Internal Medicine

## 2014-10-01 DIAGNOSIS — Z1231 Encounter for screening mammogram for malignant neoplasm of breast: Secondary | ICD-10-CM | POA: Diagnosis not present

## 2014-10-07 ENCOUNTER — Encounter: Payer: Self-pay | Admitting: Family

## 2014-10-07 ENCOUNTER — Ambulatory Visit (INDEPENDENT_AMBULATORY_CARE_PROVIDER_SITE_OTHER): Payer: Commercial Managed Care - HMO | Admitting: Family

## 2014-10-07 VITALS — BP 122/82 | HR 76 | Temp 97.6°F | Resp 18 | Ht 66.0 in | Wt 121.1 lb

## 2014-10-07 DIAGNOSIS — E669 Obesity, unspecified: Secondary | ICD-10-CM | POA: Diagnosis not present

## 2014-10-07 DIAGNOSIS — Z0000 Encounter for general adult medical examination without abnormal findings: Secondary | ICD-10-CM | POA: Insufficient documentation

## 2014-10-07 DIAGNOSIS — E038 Other specified hypothyroidism: Secondary | ICD-10-CM | POA: Diagnosis not present

## 2014-10-07 DIAGNOSIS — Z23 Encounter for immunization: Secondary | ICD-10-CM | POA: Diagnosis not present

## 2014-10-07 DIAGNOSIS — E559 Vitamin D deficiency, unspecified: Secondary | ICD-10-CM | POA: Diagnosis not present

## 2014-10-07 DIAGNOSIS — E119 Type 2 diabetes mellitus without complications: Secondary | ICD-10-CM | POA: Diagnosis not present

## 2014-10-07 NOTE — Assessment & Plan Note (Signed)
1) Anticipatory Guidance: Discussed importance of wearing a seatbelt while driving and not texting while driving; changing batteries in smoke detector at least once annually; wearing suntan lotion when outside; eating a balanced and moderate diet; getting physical activity at least 30 minutes per day.  2) Immunizations / Screenings / Labs:  Patient received Prevnar and Tetanus today. All other immunizations are up-to-date per recommendations. Patient is due for eye exam which is scheduled for March. All other screenings are up-to-date per recommendations. Labwork as previously ordered by Dr. Tobe Sos. Hold on lab work until those results are received.  Overall well exam. Patient's major risk factor at this time is her smoking. She is not ready to quit smoking and has no intentions of quitting smoking at this time. Other risk factors of hyperlipidemia, diabetes, and hypertension are well controlled with current medication. Encouraged patient to exercise at least 30 minutes most days of the week with attention to weight bearing exercise. Follow up prevention exam in one year.

## 2014-10-07 NOTE — Addendum Note (Signed)
Addended by: Delice Bison E on: 10/07/2014 10:49 AM   Modules accepted: Orders

## 2014-10-07 NOTE — Progress Notes (Signed)
Pre visit review using our clinic review tool, if applicable. No additional management support is needed unless otherwise documented below in the visit note. 

## 2014-10-07 NOTE — Progress Notes (Signed)
Subjective:    Patient ID: Stacy [name], female    DOB: Apr 04, 1941, 74 y.o.   MRN: [id number]  Chief Complaint  Patient presents with  . CPE    not fasting    HPI:  Stacy [name] is a 74 y.o. female who presents today for an annual wellness visit.   1) Health Maintenance -   Diet - Averages about 2 meals per day; protein, fruits and vegetables. 2 cups of caffeine per day  Exercise - Walks occasionally but no structured problem.   2) Preventative Exams / Immunizations:  Dental -- Up to date  Vision -- Due for exam - exam is scheduled for March   Health Maintenance  Topic Date Due  . URINE MICROALBUMIN  11/10/1950  . PNA vac Low Risk Adult (2 of 2 - PCV13) 01/06/2007  . OPHTHALMOLOGY EXAM  04/15/2013  . TETANUS/TDAP  08/07/2014  . HEMOGLOBIN A1C  10/14/2014  . FOOT EXAM  11/06/2014  . INFLUENZA VACCINE  03/08/2015  . MAMMOGRAM  10/01/2016  . COLONOSCOPY  11/05/2020  . DEXA SCAN  Completed  . PNEUMOCOCCAL POLYSACCHARIDE VACCINE AGE 41 AND OVER  Completed  . ZOSTAVAX  Addressed  Tetanus shot, Prevnar 13   Immunization History  Administered Date(s) Administered  . Influenza Split 06/03/2012  . Influenza Whole 08/11/2009, 05/26/2010  . Influenza, High Dose Seasonal PF 05/07/2013  . Influenza,inj,Quad PF,36+ Mos 04/15/2014  . Pneumococcal Polysaccharide-23 01/05/2006  . Td 08/07/2004    Allergies  Allergen Reactions  . Codeine     REACTION: vomiting  . Latex Other (See Comments)    irritation  . Metronidazole Other (See Comments)    Reaction unknown  . Other     Bandaids cause rash  . Penicillins     REACTION: throat swelling, rash    Current Outpatient Prescriptions on File Prior to Visit  Medication Sig Dispense Refill  . ACCU-CHEK FASTCLIX LANCETS MISC 1 each by Does not apply route 2 (two) times daily. USE TO HELP CHECK BLOOD SUGAR TWICE A DAY DX 250.00 180 each 3  . atorvastatin (LIPITOR) 80 MG tablet Take 1 tablet (80 mg total) by mouth  daily at 6 PM. 90 tablet 2  . Blood Glucose Calibration (ACCU-CHEK SMARTVIEW CONTROL) LIQD USE AD DIRECTED 3 each 0  . Blood Glucose Monitoring Suppl (ACCU-CHEK NANO SMARTVIEW) W/DEVICE KIT USE TO CHECK BLOOD SUGARS TWICE A DAY DX 250.00 1 kit 0  . Calcium Carbonate (CALCIUM 500 PO) Take 1 tablet by mouth at bedtime.     Marland Kitchen desoximetasone (TOPICORT) 0.05 % cream Apply 1 application topically 2 (two) times daily as needed (itching).    Marland Kitchen donepezil (ARICEPT) 10 MG tablet Take 1 tablet (10 mg total) by mouth at bedtime. 90 tablet 3  . FLUoxetine (PROZAC) 10 MG capsule Take 10 mg by mouth daily.    Marland Kitchen glucose blood (ACCU-CHEK SMARTVIEW) test strip Check sugar 2 x daily ICD10 code-E11.65 300 each 4  . Lancets 28G MISC Use with device to check blood sugar at least once a day 100 each 5  . levothyroxine (SYNTHROID, LEVOTHROID) 112 MCG tablet Take 1 tablet (112 mcg total) by mouth daily. 30 tablet 1  . lithium carbonate 300 MG capsule Take 300 mg by mouth daily.    Marland Kitchen losartan (COZAAR) 50 MG tablet Take 1 tablet (50 mg total) by mouth daily. 90 tablet 0  . metFORMIN (GLUCOPHAGE-XR) 500 MG 24 hr tablet Take 1 tablet (500 mg total) by  mouth daily with breakfast. 90 tablet 3  . Multiple Vitamin (MULTIVITAMIN) tablet Take 1 tablet by mouth at bedtime.     Marland Kitchen omeprazole (PRILOSEC) 40 MG capsule TAKE 1 CAPSULE EVERY DAY  BEFORE  BREAKFAST 90 capsule 3  . PRADAXA 150 MG CAPS capsule TAKE 1 CAPSULE EVERY 12 HOURS 180 capsule 2  . Propylene Glycol (SYSTANE BALANCE OP) Apply 1 drop to eye at bedtime.    . solifenacin (VESICARE) 5 MG tablet Take 5 mg by mouth daily.     No current facility-administered medications on file prior to visit.    Past Medical History  Diagnosis Date  . Chronic bipolar disorder   . Coronary artery disease     s/p PTCA  . Complete AV block     s/p PPM--MEDTRONIC ADAPTA ADDr01  . Takotsubo syndrome   . GERD (gastroesophageal reflux disease)   . COPD (chronic obstructive pulmonary  disease)     TOBACCO ABUSE  . Abscess of liver(572.0)   . Atrial fibrillation     chronic anticoag - pradaxa  . BIPOLAR AFFECTIVE DISORDER   . DEPRESSION   . DIABETES MELLITUS, TYPE II dx 04/2010  . DYSLIPIDEMIA   . HYPERTENSION   . HYPOTHYROIDISM     hashimoto's  . TOBACCO ABUSE   . Pacemaker-Medtronic-dual-chamber   . Parathyroid related hypercalcemia   . Primary hyperparathyroidism   . Vitamin D deficiency disease   . Cancer     basal cell on abdomen    Past Surgical History  Procedure Laterality Date  . Insert / replace / remove pacemaker      MEDTRONIC ADAPTA ADDr01  . Parathyroidectomy      RIGHT INFERIOR  . Abdominal hysterectomy    . Cardiac catheterization    . Mass excision Left 01/27/2013    Procedure: EXCISION MASS LEFT FLANK;  Surgeon: Earnstine Regal, MD;  Location: [facility];  Service: General;  Laterality: Left;    Family History  Problem Relation Age of Onset  . CAD Mother   . Colon cancer      FH  . Diabetes Maternal Aunt   . CAD Father   . Diabetes type II Sister     History   Social History  . Marital Status: Single    Spouse Name: N/A  . Number of Children: 2  . Years of Education: N/A   Occupational History  .     Social History Main Topics  . Smoking status: Current Every Day Smoker -- 0.50 packs/day for 48 years    Types: Cigarettes  . Smokeless tobacco: Never Used     Comment: switched to vapor cigs. Widowed- 2 girls. Lives with daughter (who is nurse at [employer])  . Alcohol Use: No     Comment: "once in a blue moon" when I have [place] food  . Drug Use: No  . Sexual Activity: No   Other Topics Concern  . Not on file   Social History Narrative   WIDOWED   2 DAUGHTERS   LIVES W/DAUGHTER   CURRENTLY SMOKES   NO ALCOHOL USE   NO ILLICIT DRUG USE   DAILY CAFFEINE USE      PPM-MEDTRONIC   PATIENT SIGNED A DESIGNATED PARTY RELEASE TO ALLOW DAUGHTER, NICOLE COX, TO HAVE ACCESS TO HER MEDICAL RECORDS/INFORMATION. Fleet Contras,  November 09, 2009 9:19 AM     RISK FACTORS  Tobacco History  Smoking status  . Current Every Day Smoker -- 0.50 packs/day  for 48 years  . Types: Cigarettes  Smokeless tobacco  . Never Used    Comment: switched to vapor cigs. Widowed- 2 girls. Lives with daughter (who is nurse at [employer])     Cardiac risk factors: advanced age (older than 66 for men, 14 for women), dyslipidemia, hypertension, sedentary lifestyle and smoking/ tobacco exposure.  Depression Screen  Q1: Over the past two weeks, have you felt down, depressed or hopeless? No  Q2: Over the past two weeks, have you felt little interest or pleasure in doing things? No  Have you lost interest or pleasure in daily life? No  Do you often feel hopeless? No  Do you cry easily over simple problems? No  Activities of Daily Living In your present state of health, do you have any difficulty performing the following activities?:  Driving? No Managing money?  No Feeding yourself? No Getting from bed to chair? No Climbing a flight of stairs? No Preparing food and eating?: No Bathing or showering? No Getting dressed: No Getting to the toilet? No Using the toilet: No Moving around from place to place: No In the past year have you fallen or had a near fall?:No   Home Safety Has smoke detector and wears seat belts. No firearms. No excess sun exposure. Are there smokers in your home (other than you)?  No Do you feel safe at home?  Yes  Hearing Difficulties: No Do you often ask people to speak up or repeat themselves? No Do you experience ringing or noises in your ears? No  Do you have difficulty understanding soft or whispered voices? No    Cognitive Testing  Alert? Yes   Normal Appearance? Yes  Oriented to person? Yes  Place? Yes   Time? Yes  Recall of three objects?  Yes  Can perform simple calculations? Yes  Displays appropriate judgment? Yes  Can read the correct time from a watch face? Yes  Do you feel that you  have a problem with memory? No  Do you often misplace items? No   Advanced Directives have been discussed with the patient? Yes  Current Physicians/Providers and Suppliers  1. Dr.Valerie Asa Lente - PCP 2. Dr. Virl Axe - Cardiology 3. Dr. Tillman Sers - Endocrinology 4. Dr. Wells Guiles Tat - Neurology 5. Comer Locket, PA-C, Psychology   Indicate any recent Medical Services you may have received from other than [facility] providers in the past year (date may be approximate).  All answers were reviewed with the patient and necessary referrals were made:  Mauricio Po, [place]   10/07/2014    Review of Systems  Constitutional: Denies fever, chills, fatigue, or significant weight gain/loss. HENT: Head: Denies headache or neck pain Ears: Denies changes in hearing, ringing in ears, earache, drainage Nose: Denies discharge, stuffiness, itching, nosebleed, sinus pain Throat: Denies sore throat, hoarseness, dry mouth, sores, thrush Eyes: Denies loss/changes in vision, pain, redness, blurry/double vision, flashing lights Cardiovascular: Denies chest pain/discomfort, tightness, palpitations, shortness of breath with activity, difficulty lying down, swelling, sudden awakening with shortness of breath Respiratory: Denies shortness of breath, cough, sputum production, wheezing Gastrointestinal: Denies dysphasia, heartburn, change in appetite, nausea, change in bowel habits, rectal bleeding, constipation, diarrhea, yellow skin or eyes Genitourinary: Denies frequency, urgency, burning/pain, blood in urine, incontinence, change in urinary strength. Musculoskeletal: Denies muscle/joint pain, stiffness, back pain, redness or swelling of joints, trauma Skin: Denies rashes, lumps, itching, dryness, color changes, or hair/nail changes Neurological: Denies dizziness, fainting, seizures, weakness, numbness, tingling, tremor Psychiatric -  Denies nervousness, stress, depression or memory loss Endocrine: Denies  heat or cold intolerance, sweating, frequent urination, excessive thirst, changes in appetite Hematologic: Denies ease of bruising or bleeding    Objective:     BP 122/82 mmHg  Pulse 76  Temp(Src) 97.6 F (36.4 C) (Oral)  Resp 18  Ht '5\' 6"'  (1.676 m)  Wt 121 lb 1.9 oz (54.94 kg)  BMI 19.56 kg/m2  SpO2 95% Nursing note and vital signs reviewed.  Physical Exam  Constitutional: She is oriented to person, place, and time. She appears well-developed and well-nourished.  HENT:  Head: Normocephalic.  Right Ear: Hearing, tympanic membrane, external ear and ear canal normal.  Left Ear: Hearing, tympanic membrane, external ear and ear canal normal.  Nose: Nose normal.  Mouth/Throat: Uvula is midline, oropharynx is clear and moist and mucous membranes are normal.  Eyes: Conjunctivae and EOM are normal. Pupils are equal, round, and reactive to light.  Neck: Neck supple. No JVD present. No tracheal deviation present. No thyromegaly present.  Cardiovascular: Normal rate, regular rhythm, normal heart sounds and intact distal pulses.   Pulmonary/Chest: Effort normal and breath sounds normal.  Abdominal: Soft. Bowel sounds are normal. She exhibits no distension and no mass. There is no tenderness. There is no rebound and no guarding.  Musculoskeletal: Normal range of motion. She exhibits no edema or tenderness.  Lymphadenopathy:    She has no cervical adenopathy.  Neurological: She is alert and oriented to person, place, and time. She has normal reflexes. No cranial nerve deficit. She exhibits normal muscle tone. Coordination normal.  Skin: Skin is warm and dry.  Psychiatric: She has a normal mood and affect. Her behavior is normal. Judgment and thought content normal.       Assessment & Plan:   During the course of the visit the patient was educated and counseled about appropriate screening and preventive services including:    Pneumococcal vaccine   Diabetes screening  Nutrition  counseling   Smoking cessation counseling  Diet review for nutrition referral? Yes ____  Not Indicated _X___   Patient Instructions (the written plan) was given to the patient.  Medicare Attestation I have personally reviewed: The patient's medical and social history Their use of alcohol, tobacco or illicit drugs Their current medications and supplements The patient's functional ability including ADLs,fall risks, home safety risks, cognitive, and hearing and visual impairment Diet and physical activities Evidence for depression or mood disorders  The patient's weight, height, BMI,  have been recorded in the chart.  I have made referrals, counseling, and provided education to the patient based on review of the above and I have provided the patient with a written personalized care plan for preventive services.     Mauricio Po, [place]   10/07/2014

## 2014-10-07 NOTE — Assessment & Plan Note (Signed)
Reviewed and updated patient's medical, surgical, family and social history. Medications and allergies were also reviewed. Basic screenings for depression, activities of daily living, hearing, cognition and safety were performed. Provider list was updated and health plan was provided to the patient.  

## 2014-10-07 NOTE — Patient Instructions (Signed)
Thank you for choosing [facility].  Summary/Instructions:  Health Maintenance Adopting a healthy lifestyle and getting preventive care can go a long way to promote health and wellness. Talk with your health care provider about what schedule of regular examinations is right for you. This is a good chance for you to check in with your provider about disease prevention and staying healthy. In between checkups, there are plenty of things you can do on your own. Experts have done a lot of research about which lifestyle changes and preventive measures are most likely to keep you healthy. Ask your health care provider for more information. WEIGHT AND DIET  Eat a healthy diet  Be sure to include plenty of vegetables, fruits, low-fat dairy products, and lean protein.  Do not eat a lot of foods high in solid fats, added sugars, or salt.  Get regular exercise. This is one of the most important things you can do for your health.  Most adults should exercise for at least 150 minutes each week. The exercise should increase your heart rate and make you sweat (moderate-intensity exercise).  Most adults should also do strengthening exercises at least twice a week. This is in addition to the moderate-intensity exercise.  Maintain a healthy weight  Body mass index (BMI) is a measurement that can be used to identify possible weight problems. It estimates body fat based on height and weight. Your health care provider can help determine your BMI and help you achieve or maintain a healthy weight.  For females 69 years of age and older:   A BMI below 18.5 is considered underweight.  A BMI of 18.5 to 24.9 is normal.  A BMI of 25 to 29.9 is considered overweight.  A BMI of 30 and above is considered obese.  Watch levels of cholesterol and blood lipids  You should start having your blood tested for lipids and cholesterol at 74 years of age, then have this test every 5 years.  You may need to have  your cholesterol levels checked more often if:  Your lipid or cholesterol levels are high.  You are older than 74 years of age.  You are at high risk for heart disease.  CANCER SCREENING   Lung Cancer  Lung cancer screening is recommended for adults 33-61 years old who are at high risk for lung cancer because of a history of smoking.  A yearly low-dose CT scan of the lungs is recommended for people who:  Currently smoke.  Have quit within the past 15 years.  Have at least a 30-pack-year history of smoking. A pack year is smoking an average of one pack of cigarettes a day for 1 year.  Yearly screening should continue until it has been 15 years since you quit.  Yearly screening should stop if you develop a health problem that would prevent you from having lung cancer treatment.  Breast Cancer  Practice breast self-awareness. This means understanding how your breasts normally appear and feel.  It also means doing regular breast self-exams. Let your health care provider know about any changes, no matter how small.  If you are in your 20s or 30s, you should have a clinical breast exam (CBE) by a health care provider every 1-3 years as part of a regular health exam.  If you are 37 or older, have a CBE every year. Also consider having a breast X-ray (mammogram) every year.  If you have a family history of breast cancer, talk to your health  care provider about genetic screening.  If you are at high risk for breast cancer, talk to your health care provider about having an MRI and a mammogram every year.  Breast cancer gene (BRCA) assessment is recommended for women who have family members with BRCA-related cancers. BRCA-related cancers include:  Breast.  Ovarian.  Tubal.  Peritoneal cancers.  Results of the assessment will determine the need for genetic counseling and BRCA1 and BRCA2 testing. Cervical Cancer Routine pelvic examinations to screen for cervical cancer are no  longer recommended for nonpregnant women who are considered low risk for cancer of the pelvic organs (ovaries, uterus, and vagina) and who do not have symptoms. A pelvic examination may be necessary if you have symptoms including those associated with pelvic infections. Ask your health care provider if a screening pelvic exam is right for you.   The Pap test is the screening test for cervical cancer for women who are considered at risk.  If you had a hysterectomy for a problem that was not cancer or a condition that could lead to cancer, then you no longer need Pap tests.  If you are older than 65 years, and you have had normal Pap tests for the past 10 years, you no longer need to have Pap tests.  If you have had past treatment for cervical cancer or a condition that could lead to cancer, you need Pap tests and screening for cancer for at least 20 years after your treatment.  If you no longer get a Pap test, assess your risk factors if they change (such as having a new sexual partner). This can affect whether you should start being screened again.  Some women have medical problems that increase their chance of getting cervical cancer. If this is the case for you, your health care provider may recommend more frequent screening and Pap tests.  The human papillomavirus (HPV) test is another test that may be used for cervical cancer screening. The HPV test looks for the virus that can cause cell changes in the cervix. The cells collected during the Pap test can be tested for HPV.  The HPV test can be used to screen women 77 years of age and older. Getting tested for HPV can extend the interval between normal Pap tests from three to five years.  An HPV test also should be used to screen women of any age who have unclear Pap test results.  After 74 years of age, women should have HPV testing as often as Pap tests.  Colorectal Cancer  This type of cancer can be detected and often  prevented.  Routine colorectal cancer screening usually begins at 74 years of age and continues through 74 years of age.  Your health care provider may recommend screening at an earlier age if you have risk factors for colon cancer.  Your health care provider may also recommend using home test kits to check for hidden blood in the stool.  A small camera at the end of a tube can be used to examine your colon directly (sigmoidoscopy or colonoscopy). This is done to check for the earliest forms of colorectal cancer.  Routine screening usually begins at age 98.  Direct examination of the colon should be repeated every 5-10 years through 74 years of age. However, you may need to be screened more often if early forms of precancerous polyps or small growths are found. Skin Cancer  Check your skin from head to toe regularly.  Tell your health  care provider about any new moles or changes in moles, especially if there is a change in a mole's shape or color.  Also tell your health care provider if you have a mole that is larger than the size of a pencil eraser.  Always use sunscreen. Apply sunscreen liberally and repeatedly throughout the day.  Protect yourself by wearing long sleeves, pants, a wide-brimmed hat, and sunglasses whenever you are outside. HEART DISEASE, DIABETES, AND HIGH BLOOD PRESSURE   Have your blood pressure checked at least every 1-2 years. High blood pressure causes heart disease and increases the risk of stroke.  If you are between 78 years and 37 years old, ask your health care provider if you should take aspirin to prevent strokes.  Have regular diabetes screenings. This involves taking a blood sample to check your fasting blood sugar level.  If you are at a normal weight and have a low risk for diabetes, have this test once every three years after 74 years of age.  If you are overweight and have a high risk for diabetes, consider being tested at a younger age or more  often. PREVENTING INFECTION  Hepatitis B  If you have a higher risk for hepatitis B, you should be screened for this virus. You are considered at high risk for hepatitis B if:  You were born in a country where hepatitis B is common. Ask your health care provider which countries are considered high risk.  Your parents were born in a high-risk country, and you have not been immunized against hepatitis B (hepatitis B vaccine).  You have HIV or AIDS.  You use needles to inject street drugs.  You live with someone who has hepatitis B.  You have had sex with someone who has hepatitis B.  You get hemodialysis treatment.  You take certain medicines for conditions, including cancer, organ transplantation, and autoimmune conditions. Hepatitis C  Blood testing is recommended for:  Everyone born from [birth year] through 1965.  Anyone with known risk factors for hepatitis C. Sexually transmitted infections (STIs)  You should be screened for sexually transmitted infections (STIs) including gonorrhea and chlamydia if:  You are sexually active and are younger than 74 years of age.  You are older than 74 years of age and your health care provider tells you that you are at risk for this type of infection.  Your sexual activity has changed since you were last screened and you are at an increased risk for chlamydia or gonorrhea. Ask your health care provider if you are at risk.  If you do not have HIV, but are at risk, it may be recommended that you take a prescription medicine daily to prevent HIV infection. This is called pre-exposure prophylaxis (PrEP). You are considered at risk if:  You are sexually active and do not regularly use condoms or know the HIV status of your partner(s).  You take drugs by injection.  You are sexually active with a partner who has HIV. Talk with your health care provider about whether you are at high risk of being infected with HIV. If you choose to begin PrEP, you  should first be tested for HIV. You should then be tested every 3 months for as long as you are taking PrEP.  PREGNANCY   If you are premenopausal and you may become pregnant, ask your health care provider about preconception counseling.  If you may become pregnant, take 400 to 800 micrograms (mcg) of folic acid every day.  If you want to prevent pregnancy, talk to your health care provider about birth control (contraception). OSTEOPOROSIS AND MENOPAUSE   Osteoporosis is a disease in which the bones lose minerals and strength with aging. This can result in serious bone fractures. Your risk for osteoporosis can be identified using a bone density scan.  If you are 7 years of age or older, or if you are at risk for osteoporosis and fractures, ask your health care provider if you should be screened.  Ask your health care provider whether you should take a calcium or vitamin D supplement to lower your risk for osteoporosis.  Menopause may have certain physical symptoms and risks.  Hormone replacement therapy may reduce some of these symptoms and risks. Talk to your health care provider about whether hormone replacement therapy is right for you.  HOME CARE INSTRUCTIONS   Schedule regular health, dental, and eye exams.  Stay current with your immunizations.   Do not use any tobacco products including cigarettes, chewing tobacco, or electronic cigarettes.  If you are pregnant, do not drink alcohol.  If you are breastfeeding, limit how much and how often you drink alcohol.  Limit alcohol intake to no more than 1 drink per day for nonpregnant women. One drink equals 12 ounces of beer, 5 ounces of wine, or 1 ounces of hard liquor.  Do not use street drugs.  Do not share needles.  Ask your health care provider for help if you need support or information about quitting drugs.  Tell your health care provider if you often feel depressed.  Tell your health care provider if you have ever  been abused or do not feel safe at home. Document Released: 02/06/2011 Document Revised: 12/08/2013 Document Reviewed: 06/25/2013 [facility] Patient Information 2015 [place], [place]. This information is not intended to replace advice given to you by your health care provider. Make sure you discuss any questions you have with your health care provider.

## 2014-10-08 ENCOUNTER — Telehealth: Payer: Self-pay | Admitting: Internal Medicine

## 2014-10-08 LAB — CALCIUM: Calcium: 9.9 mg/dL (ref 8.4–10.5)

## 2014-10-08 LAB — VITAMIN D 25 HYDROXY (VIT D DEFICIENCY, FRACTURES): VIT D 25 HYDROXY: 53 ng/mL (ref 30–100)

## 2014-10-08 LAB — PTH, INTACT AND CALCIUM
Calcium: 9.9 mg/dL (ref 8.4–10.5)
PTH: 49 pg/mL (ref 14–64)

## 2014-10-08 LAB — LIPID PANEL
Cholesterol: 109 mg/dL (ref 0–200)
HDL: 69 mg/dL (ref >=46)
LDL Cholesterol: 18 mg/dL (ref 0–99)
Total CHOL/HDL Ratio: 1.6 Ratio
Triglycerides: 109 mg/dL (ref <150)
VLDL: 22 mg/dL (ref 0–40)

## 2014-10-08 LAB — TSH: TSH: 3.734 u[IU]/mL (ref 0.350–4.500)

## 2014-10-08 LAB — T4, FREE: Free T4: 1.11 ng/dL (ref 0.80–1.80)

## 2014-10-08 LAB — T3, FREE: T3, Free: 2.6 pg/mL (ref 2.3–4.2)

## 2014-10-08 LAB — LITHIUM LEVEL: Lithium Lvl: 0.6 mEq/L — ABNORMAL LOW (ref 0.80–1.40)

## 2014-10-08 NOTE — Telephone Encounter (Signed)
emmi emailed °

## 2014-10-14 ENCOUNTER — Ambulatory Visit: Payer: Medicare HMO | Admitting: "Endocrinology

## 2014-10-21 ENCOUNTER — Encounter: Payer: Self-pay | Admitting: *Deleted

## 2014-11-03 DIAGNOSIS — F3181 Bipolar II disorder: Secondary | ICD-10-CM | POA: Diagnosis not present

## 2014-11-03 DIAGNOSIS — I442 Atrioventricular block, complete: Secondary | ICD-10-CM

## 2014-11-04 ENCOUNTER — Ambulatory Visit (INDEPENDENT_AMBULATORY_CARE_PROVIDER_SITE_OTHER): Payer: Commercial Managed Care - HMO | Admitting: *Deleted

## 2014-11-04 DIAGNOSIS — I442 Atrioventricular block, complete: Secondary | ICD-10-CM

## 2014-11-04 NOTE — Progress Notes (Signed)
Remote pacemaker transmission.   

## 2014-11-05 LAB — MDC_IDC_ENUM_SESS_TYPE_REMOTE
Battery Voltage: 2.69 V
Brady Statistic AP VP Percent: 13 %
Brady Statistic AP VS Percent: 60 %
Brady Statistic AS VP Percent: 0 %
Brady Statistic AS VS Percent: 27 %
Lead Channel Impedance Value: 1209 Ohm
Lead Channel Impedance Value: 640 Ohm
Lead Channel Pacing Threshold Amplitude: 0.625 V
Lead Channel Pacing Threshold Amplitude: 1.125 V
Lead Channel Pacing Threshold Pulse Width: 0.4 ms
Lead Channel Sensing Intrinsic Amplitude: 1.4 mV
Lead Channel Sensing Intrinsic Amplitude: 11.2 mV
Lead Channel Setting Pacing Amplitude: 2.5 V
Lead Channel Setting Pacing Pulse Width: 0.4 ms
MDC IDC MSMT BATTERY IMPEDANCE: 2988 Ohm
MDC IDC MSMT BATTERY REMAINING LONGEVITY: 17 mo
MDC IDC MSMT LEADCHNL RV PACING THRESHOLD PULSEWIDTH: 0.4 ms
MDC IDC SESS DTM: 20160329143347
MDC IDC SET LEADCHNL RA PACING AMPLITUDE: 2 V
MDC IDC SET LEADCHNL RV SENSING SENSITIVITY: 4 mV

## 2014-11-11 ENCOUNTER — Encounter: Payer: Self-pay | Admitting: Cardiology

## 2014-11-18 ENCOUNTER — Encounter: Payer: Self-pay | Admitting: Internal Medicine

## 2014-11-25 ENCOUNTER — Encounter: Payer: Self-pay | Admitting: Cardiology

## 2014-11-27 ENCOUNTER — Other Ambulatory Visit: Payer: Self-pay | Admitting: *Deleted

## 2014-11-27 DIAGNOSIS — E034 Atrophy of thyroid (acquired): Secondary | ICD-10-CM

## 2014-11-27 MED ORDER — LEVOTHYROXINE SODIUM 112 MCG PO TABS: 112.0000 ug | ORAL_TABLET | Freq: Every day | ORAL | Status: DC

## 2014-12-01 ENCOUNTER — Encounter: Payer: Self-pay | Admitting: "Endocrinology

## 2014-12-01 ENCOUNTER — Ambulatory Visit (INDEPENDENT_AMBULATORY_CARE_PROVIDER_SITE_OTHER): Payer: Commercial Managed Care - HMO | Admitting: "Endocrinology

## 2014-12-01 VITALS — BP 115/77 | HR 71 | Wt 123.0 lb

## 2014-12-01 DIAGNOSIS — F3110 Bipolar disorder, current episode manic without psychotic features, unspecified: Secondary | ICD-10-CM

## 2014-12-01 DIAGNOSIS — R7309 Other abnormal glucose: Secondary | ICD-10-CM | POA: Diagnosis not present

## 2014-12-01 DIAGNOSIS — E038 Other specified hypothyroidism: Secondary | ICD-10-CM | POA: Diagnosis not present

## 2014-12-01 DIAGNOSIS — E559 Vitamin D deficiency, unspecified: Secondary | ICD-10-CM

## 2014-12-01 DIAGNOSIS — E063 Autoimmune thyroiditis: Secondary | ICD-10-CM | POA: Diagnosis not present

## 2014-12-01 DIAGNOSIS — E211 Secondary hyperparathyroidism, not elsewhere classified: Secondary | ICD-10-CM

## 2014-12-01 DIAGNOSIS — E049 Nontoxic goiter, unspecified: Secondary | ICD-10-CM | POA: Diagnosis not present

## 2014-12-01 DIAGNOSIS — R7303 Prediabetes: Secondary | ICD-10-CM

## 2014-12-01 LAB — GLUCOSE, POCT (MANUAL RESULT ENTRY): POC Glucose: 114 mg/dl — AB (ref 70–99)

## 2014-12-01 LAB — POCT GLYCOSYLATED HEMOGLOBIN (HGB A1C): HEMOGLOBIN A1C: 5.7

## 2014-12-01 NOTE — Progress Notes (Signed)
Subjective:  Patient Name: Stacy [name] Date of Birth: 17-Nov-1940  MRN: [id number]  Stacy [name]  presents to the office today for follow-up of her T2DM, hypothyroidism, thyroiditis, hypertension, fatigue, secondary hyperparathyroidism, vitamin D deficiency, hyperlipidemia, tobacco abuse, and tremor.  HISTORY OF PRESENT ILLNESS:   Stacy [name] is a 74 y.o. Caucasian woman.  Stacy [name] was unaccompanied.  1. Patient was first referred to me on 09/04/05 for evaluation and management of chronic hypothyroidism and new hypercalcemia and hyperparathyroidism. In brief, she developed vitamin D deficiency, then secondary hyperparathyroidism, and then tertiary hyperparathyroidism. Her right inferior parathyroid gland was quite enlarged. She was taken to surgery on 12/07/05 for parathyroidectomy. Since that time she's done well, provided that she has continued to take her calcium and vitamin D.  She has had some waxing and waning of her thyroid gland size during these years, which is consistent with inflammatory flare-ups of her Hashimoto's disease. We've increased her thyroid hormone dose slightly over time. She now takes 100 mcg of Synthroid daily. She has remained euthyroid most of the time.   2. The patient has experienced many other problems during the last 8 years, to include bipolar disorder, GERD, fatigue, iron deficiency, 2 heart attacks, hypertension, orthostatic hypotension, atrial fibrillation, and the need for a cardiac pacemaker. We've helped to manage some of those problems and kept her as supplied as we could with samples of antihypertensive medications and Synthroid. She was diagnosed with type 2 diabetes in early 2012 and was started on metformin 500 mg once daily. She was also started on atorvastatin for hyperlipidemia.  3. The patient's last [facility] visit was on 04/15/14. In the interim, she has been healthy and has been doing well emotionally. She has been worried about whether or not some issues in her  daughter's past will interfere with her taking her state boards. She is taking Synthroid, 112 mcg/day and metformin, 500 mg, once daily. She no longer takes propranolol every day to attempt to control her tremor.  She also takes atorvastatin, calcium carbonate, fluoxetine, lithium, losartan, and solifenacin. She has intentionally reduced her intake of sweets and other carbs, to include honey. She has not had any problems with vertigo recently. Her allergies have bothered her more recently.   4. Pertinent Review of Systems:  Constitutional: The patient feel, "Good, I can't really complain."  She has problems with insomnia and staying asleep.  She also has frequent nocturia. Her energy level is pretty good. She has been walking more. Her attitude has been good.  Eyes: Vision is good as long as she wears her glasses. She had her last eye exam about 10 months ago. She has a follow up eye appointment next week. There are no other significant eye complaints. Neck: The patient has no complaints of anterior neck swelling, soreness, tenderness,  pressure, discomfort, or difficulty swallowing.  Heart: Her pacemaker is still working. She has no complaints of palpitations, irregular heat beats, chest pain, or chest pressure. Gastrointestinal: Her stomach no longer "growls", but she sometimes has loose stools. The patient has no complaints of acid reflux, upset stomach, stomach aches or pains or constipation. Arms: She occasionally still has pain in her left shoulder (trapezius areas) when lying on her left side.  Legs: She has occasional pains in her left hip and left knee. Muscle mass and strength seem normal. There are no complaints of numbness, tingling, or burning. No edema is noted. Feet: There are no obvious foot problems. There are no complaints of numbness, tingling, burning,  or pain. No edema is noted. Hypoglycemia: None  5. BG printout: She checked BGs 23 mornings in 28 days. Her BG range was 86-118.     PAST MEDICAL, FAMILY, AND SOCIAL HISTORY:  Past Medical History  Diagnosis Date  . Chronic bipolar disorder   . Coronary artery disease     s/p PTCA  . Complete AV block     s/p PPM--MEDTRONIC ADAPTA ADDr01  . Takotsubo syndrome   . GERD (gastroesophageal reflux disease)   . COPD (chronic obstructive pulmonary disease)     TOBACCO ABUSE  . Abscess of liver(572.0)   . Atrial fibrillation     chronic anticoag - pradaxa  . BIPOLAR AFFECTIVE DISORDER   . DEPRESSION   . DIABETES MELLITUS, TYPE II dx 04/2010  . DYSLIPIDEMIA   . HYPERTENSION   . HYPOTHYROIDISM     hashimoto's  . TOBACCO ABUSE   . Pacemaker-Medtronic-dual-chamber   . Parathyroid related hypercalcemia   . Primary hyperparathyroidism   . Vitamin D deficiency disease   . Cancer     basal cell on abdomen    Family History  Problem Relation Age of Onset  . CAD Mother   . Colon cancer      FH  . Diabetes Maternal Aunt   . CAD Father   . Diabetes type II Sister      Current outpatient prescriptions:  .  ACCU-CHEK FASTCLIX LANCETS MISC, 1 each by Does not apply route 2 (two) times daily. USE TO HELP CHECK BLOOD SUGAR TWICE A DAY DX 250.00, Disp: 180 each, Rfl: 3 .  atorvastatin (LIPITOR) 80 MG tablet, Take 1 tablet (80 mg total) by mouth daily at 6 PM., Disp: 90 tablet, Rfl: 2 .  Blood Glucose Calibration (ACCU-CHEK SMARTVIEW CONTROL) LIQD, USE AD DIRECTED, Disp: 3 each, Rfl: 0 .  Blood Glucose Monitoring Suppl (ACCU-CHEK NANO SMARTVIEW) W/DEVICE KIT, USE TO CHECK BLOOD SUGARS TWICE A DAY DX 250.00, Disp: 1 kit, Rfl: 0 .  Calcium Carbonate (CALCIUM 500 PO), Take 1 tablet by mouth at bedtime. , Disp: , Rfl:  .  desoximetasone (TOPICORT) 0.05 % cream, Apply 1 application topically 2 (two) times daily as needed (itching)., Disp: , Rfl:  .  donepezil (ARICEPT) 10 MG tablet, Take 1 tablet (10 mg total) by mouth at bedtime., Disp: 90 tablet, Rfl: 3 .  FLUoxetine (PROZAC) 10 MG capsule, Take 10 mg by mouth daily.,  Disp: , Rfl:  .  glucose blood (ACCU-CHEK SMARTVIEW) test strip, Check sugar 2 x daily ICD10 code-E11.65, Disp: 300 each, Rfl: 4 .  Lancets 28G MISC, Use with device to check blood sugar at least once a day, Disp: 100 each, Rfl: 5 .  levothyroxine (SYNTHROID, LEVOTHROID) 112 MCG tablet, Take 1 tablet (112 mcg total) by mouth daily., Disp: 10 tablet, Rfl: 1 .  lithium carbonate 300 MG capsule, Take 300 mg by mouth daily., Disp: , Rfl:  .  losartan (COZAAR) 50 MG tablet, Take 1 tablet (50 mg total) by mouth daily., Disp: 90 tablet, Rfl: 0 .  metFORMIN (GLUCOPHAGE-XR) 500 MG 24 hr tablet, Take 1 tablet (500 mg total) by mouth daily with breakfast., Disp: 90 tablet, Rfl: 3 .  Multiple Vitamin (MULTIVITAMIN) tablet, Take 1 tablet by mouth at bedtime. , Disp: , Rfl:  .  omeprazole (PRILOSEC) 40 MG capsule, TAKE 1 CAPSULE EVERY DAY  BEFORE  BREAKFAST, Disp: 90 capsule, Rfl: 3 .  PRADAXA 150 MG CAPS capsule, TAKE 1 CAPSULE EVERY 12 HOURS, Disp:  180 capsule, Rfl: 2 .  Propylene Glycol (SYSTANE BALANCE OP), Apply 1 drop to eye at bedtime., Disp: , Rfl:  .  solifenacin (VESICARE) 5 MG tablet, Take 5 mg by mouth daily., Disp: , Rfl:   Allergies as of 12/01/2014 - Review Complete 12/01/2014  Allergen Reaction Noted  . Codeine    . Latex Other (See Comments) 05/03/2012  . Metronidazole Other (See Comments) 03/26/2008  . Other  01/24/2013  . Penicillins      1. Work and Family: Her daughter, [name], lives with her. [name] will graduate from nursing school soon.  2. Activities: Jameika has been walking more.     3. Smoking, alcohol, or drugs: She stopped smoking, but uses a nicotine-free vapor cigarette.   4. Primary Care Provider: Gwendolyn Grant, MD  5. Psych: Comer Locket, PA, [facility], office [phone], fax [phone]  REVIEW OF SYSTEMS: There are no other significant problems involving Gordon's other body systems.   Objective:  Vital Signs:  BP 115/77 mmHg  Pulse 71   Wt 123 lb (55.792 kg)   Ht Readings from Last 3 Encounters:  10/07/14 _0  (1.676 m)  08/03/14 _1  (1.651 m)  04/29/14 _2  (1.676 m)   Wt Readings from Last 3 Encounters:  12/01/14 123 lb (55.792 kg)  10/07/14 121 lb 1.9 oz (54.94 kg)  08/03/14 125 lb (56.7 kg)   PHYSICAL EXAM:  Constitutional: The patient appears healthy and well nourished. She looks good.  Her weight has decreased by 1 pounds in 5 months. Her mood and affect are normal. Her insight is good.  Head: Her head tremor is still present, perhaps a bit more obvious since stopping the propranolol.but is less since starting propranolol.  Face: The face appears normal.  Eyes: There is no obvious arcus or proptosis. Moisture appears normal. Mouth: The oropharynx and tongue appear normal. Oral moisture is normal. Neck: The neck appears to be visibly normal. No carotid bruits are noted. She has a low-lying thyroid gland. The thyroid gland is within normal size at 18-20 grams. Both lobes today are within normal limits for size. The consistency of the thyroid gland is normal. The thyroid gland is not tender to palpation. Lungs: The lungs are clear to auscultation. Air movement is good. Heart: Heart rate and rhythm are regular. Heart sounds S1 and S2 are normal. I did not appreciate any pathologic cardiac murmurs. Abdomen: The abdomen is mildly enlarged. Bowel sounds are normal. There is no obvious hepatomegaly, splenomegaly, or other mass effect.  Arms: Muscle size and bulk are normal for age.  Hands: She has a 2+ hand tremor. Phalangeal and metacarpophalangeal joints are normal. Palmar muscles are normal. Palmar skin is normal. Palmar moisture is also normal. Fingernails are somewhat pale. Legs: Muscles appear normal for age. No edema is present. Feet: She has a 2+ dorsalis pedis pulses bilaterally.  Neurologic: Strength is normal for age in both the upper and lower extremities. Muscle tone is normal. Sensation to touch is normal  in both legs and feet. She walked normally, without any of the hesitation and rigidity usually seen with Parkinson's disease.   LAB DATA: Hemoglobin A1c was 5.7%.compared with 5.6% at last visit, and with 6.1% at the visit prior.    Labs 10/07/14: Calcium 9.9, PTH 49, 25-OH vitamin D 53; cholesterol 109, triglycerides 109, HDL 69, LDL 18; TSH 3.734, free T4; lithium 0.60 (0.80-1.40)  Labs 12/15/13: TSH 4.984, free T4 1.11, free T3 2.6; lithium 0.60  Labs  11/05/13: Cholesterol 117, triglycerides 112, HDL 63.5, LDL 31; TSH 4.84  Labs 05/05/13: TSH 3.575, free T4 1.46, free T3 2.5  Labs 01/30/13: Cholesterol 93, triglycerides 61, HDL 56, LDL 24  Labs 10/30/12: TSH 2.550, free T4 1.24, free T3 2.5; calcium 10.4, PTH 36.3, 24-hydroxy vitamin D 42; lithium 0.80 (0.80-1.40)   Labs 04/18/12: TSH was 2.106, Free T4 was 1.41. Free T3 was 2.8.                   Labs 10/25/11: PTH 30.8, calcium 10.4, 25-hydroxy vitamin D 43; WBC 7.8, hemoglobin 14.9, hematocrit 46.9%, iron 71             Assessment and Plan:   ASSESSMENT:  1&2. Hypothyroidism/ Hashimoto's thyroiditis:   A. Her TFTS in September 2014 , at first glance, did not make sense. This pattern was c/w a recent flare up of Hashimoto's Dz. We could not make any decisions about whether or not to change her Synthroid dose until the TFTs stabilized.   B. Her TFTs in April 2015 were mildly low. Since she was in the midst of an episode of lithium toxicity and may have missed some Synthroid doses, I decided not to change her Synthroid dose.    C. Her TFTs in March were slightly low. I would like to re-check the TFTs, however, before increasing her dose.   D. Her thyroiditis is again clinically quiescent  3. Hypertension: Her blood pressure is very good today.    4. T2DM/prediabetes: Her HbA1c is again mildly elevated. It appears that her diet is tighter on some days than on others. Her reduction in carb intake and her continuing to take metformin have  worked together in the past to improve her BG control. We do not need to increase her metformin to twice daily.  5.  Fatigue: She is really feeling very well. 6.  Hyperparathyroidism and vitamin D deficiency: Her labs in March 2016 were normal. She was very compliant with taking her Caltrate-D and MVI each evening.   7. Tobacco abuse: She stopped smoking, but instead is using a "nicotine-free" vapor cigarette. 8. Tremor: Her tremor is not due to Parkinson's Dz. She was doing better with propranolol. 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 9. Pallor: Her pallor has resolved. Her iron level, hemoglobin, and hematocrit were fine 2 years ago.  10. Goiter: Her thyroid gland is again within normal size today.  11. Hyperlipidemia: Her lipids were very good in march 2016.. 12. Bipolar disorder: She looks well, but her lithium level in March was low. I will forward a copy of this note to Mr. Shugart.   PLAN:  1. Diagnostic:  TFTs next week and again one week prior to next visit. 2. Therapeutic:  Eat Right, exercise right (walk 30-60 minutes per day), and take your Synthroid, calcium, and metformin.  3. Patient education: We talked a lot about her tremor, her smoking, and her thyroiditis and hypothyroidism.  4. Follow-up: 6 months with me. Please schedule FU visits with Dr. Asa Lente or her replacement as she desires.  Level of Service: This visit lasted in excess of 50 minutes. More than 50% of the visit was devoted to counseling.   Sherrlyn Hock, MD 12/01/2014 9:56 AM

## 2014-12-01 NOTE — Patient Instructions (Addendum)
Follow up visit in 6 months. Please have thyroid lab tests done next week and again one week prior to next visit.

## 2014-12-08 ENCOUNTER — Other Ambulatory Visit: Payer: Self-pay | Admitting: Internal Medicine

## 2014-12-10 ENCOUNTER — Other Ambulatory Visit: Payer: Self-pay | Admitting: *Deleted

## 2014-12-10 DIAGNOSIS — E034 Atrophy of thyroid (acquired): Secondary | ICD-10-CM

## 2014-12-10 DIAGNOSIS — E038 Other specified hypothyroidism: Secondary | ICD-10-CM | POA: Diagnosis not present

## 2014-12-10 LAB — T4, FREE: Free T4: 1.33 ng/dL (ref 0.80–1.80)

## 2014-12-10 LAB — TSH: TSH: 1.977 u[IU]/mL (ref 0.350–4.500)

## 2014-12-10 LAB — T3, FREE: T3, Free: 2.4 pg/mL (ref 2.3–4.2)

## 2014-12-14 ENCOUNTER — Encounter: Payer: Self-pay | Admitting: *Deleted

## 2014-12-15 ENCOUNTER — Other Ambulatory Visit: Payer: Self-pay | Admitting: "Endocrinology

## 2015-01-06 DIAGNOSIS — H524 Presbyopia: Secondary | ICD-10-CM | POA: Diagnosis not present

## 2015-01-06 DIAGNOSIS — Z961 Presence of intraocular lens: Secondary | ICD-10-CM | POA: Diagnosis not present

## 2015-01-06 DIAGNOSIS — H04123 Dry eye syndrome of bilateral lacrimal glands: Secondary | ICD-10-CM | POA: Diagnosis not present

## 2015-01-06 DIAGNOSIS — H26492 Other secondary cataract, left eye: Secondary | ICD-10-CM | POA: Diagnosis not present

## 2015-01-14 ENCOUNTER — Telehealth: Payer: Self-pay | Admitting: Internal Medicine

## 2015-01-14 DIAGNOSIS — E119 Type 2 diabetes mellitus without complications: Secondary | ICD-10-CM

## 2015-01-14 NOTE — Telephone Encounter (Signed)
Patient talked with [facility] about referrals with Dr. Tobe Sos and since they don't back track referrals they will accept a letter from you stating you said she can see him.

## 2015-01-17 ENCOUNTER — Other Ambulatory Visit: Payer: Self-pay | Admitting: Neurology

## 2015-01-18 DIAGNOSIS — F3181 Bipolar II disorder: Secondary | ICD-10-CM | POA: Diagnosis not present

## 2015-01-18 NOTE — Telephone Encounter (Signed)
Refill approved and sent to patient's pharmacy. Note to pharmacy that further refills to come from PCP.

## 2015-01-18 NOTE — Telephone Encounter (Signed)
Patient is following up PRN. Please advise if okay to fill RX.

## 2015-01-18 NOTE — Telephone Encounter (Signed)
Give 3 months worth med and then pt will need refills from PCP since no longer following with me

## 2015-01-20 NOTE — Telephone Encounter (Signed)
Approval to write letter?

## 2015-01-20 NOTE — Telephone Encounter (Signed)
[employer] does not allow retro-referrals.

## 2015-01-26 NOTE — Telephone Encounter (Signed)
Ok to generate letter thanks

## 2015-01-27 ENCOUNTER — Other Ambulatory Visit: Payer: Self-pay | Admitting: Internal Medicine

## 2015-01-27 NOTE — Telephone Encounter (Signed)
Letter completed and ready for MD to sign.

## 2015-01-27 NOTE — Telephone Encounter (Signed)
Fax for [facility]   [phone]  She would like a copy sent to her as well of the letter

## 2015-01-27 NOTE — Telephone Encounter (Signed)
Letter draft has been completed. Sent to PCP for review and editing if needed.

## 2015-01-29 DIAGNOSIS — Z79899 Other long term (current) drug therapy: Secondary | ICD-10-CM | POA: Diagnosis not present

## 2015-01-29 DIAGNOSIS — F3181 Bipolar II disorder: Secondary | ICD-10-CM | POA: Diagnosis not present

## 2015-01-29 NOTE — Telephone Encounter (Signed)
Letter faxed and pt informed

## 2015-02-03 ENCOUNTER — Ambulatory Visit (INDEPENDENT_AMBULATORY_CARE_PROVIDER_SITE_OTHER): Payer: Commercial Managed Care - HMO | Admitting: *Deleted

## 2015-02-03 ENCOUNTER — Telehealth: Payer: Self-pay | Admitting: Cardiology

## 2015-02-03 DIAGNOSIS — I442 Atrioventricular block, complete: Secondary | ICD-10-CM

## 2015-02-03 NOTE — Telephone Encounter (Signed)
Spoke with pt and reminded pt of remote transmission that is due today. Pt verbalized understanding.   

## 2015-02-03 NOTE — Progress Notes (Signed)
Remote pacemaker transmission.   

## 2015-02-04 DIAGNOSIS — H26492 Other secondary cataract, left eye: Secondary | ICD-10-CM | POA: Diagnosis not present

## 2015-02-11 NOTE — Telephone Encounter (Signed)
[employer] will retro cover the DOS not originally paid for OV with Dr. Tobe Sos.  They required a referral for future dates and another referral for retro DOS.  Both referrals entered.  [facility] copied to this.  PCP copied for informational purpose.

## 2015-02-11 NOTE — Addendum Note (Signed)
Addended by: Lowella Dandy on: 02/11/2015 03:25 PM   Modules accepted: Orders

## 2015-02-11 NOTE — Telephone Encounter (Signed)
Cody with [facility] called regarding the letter that was faxed.

## 2015-02-12 NOTE — Telephone Encounter (Signed)
Both referrals have been submitted. Awaiting approval on retro.

## 2015-02-14 ENCOUNTER — Other Ambulatory Visit: Payer: Self-pay | Admitting: Internal Medicine

## 2015-02-18 ENCOUNTER — Other Ambulatory Visit: Payer: Self-pay

## 2015-02-18 MED ORDER — SOLIFENACIN SUCCINATE 5 MG PO TABS: 5.0000 mg | ORAL_TABLET | Freq: Every day | ORAL | Status: DC

## 2015-02-18 MED ORDER — ACCU-CHEK NANO SMARTVIEW W/DEVICE KIT: PACK | Status: DC

## 2015-02-18 MED ORDER — BD SWAB SINGLE USE REGULAR PADS
MEDICATED_PAD | Status: DC
Start: 2015-02-18 — End: 2015-05-10

## 2015-02-19 LAB — CUP PACEART REMOTE DEVICE CHECK
Battery Voltage: 2.69 V
Brady Statistic AP VP Percent: 11 %
Brady Statistic AP VS Percent: 59 %
Brady Statistic AS VP Percent: 0 %
Date Time Interrogation Session: 20160629171545
Lead Channel Pacing Threshold Pulse Width: 0.4 ms
Lead Channel Pacing Threshold Pulse Width: 0.4 ms
Lead Channel Sensing Intrinsic Amplitude: 11.2 mV
Lead Channel Setting Pacing Amplitude: 2 V
Lead Channel Setting Pacing Amplitude: 2.5 V
Lead Channel Setting Pacing Pulse Width: 0.4 ms
MDC IDC MSMT BATTERY IMPEDANCE: 3441 Ohm
MDC IDC MSMT BATTERY REMAINING LONGEVITY: 13 mo
MDC IDC MSMT LEADCHNL RA IMPEDANCE VALUE: 583 Ohm
MDC IDC MSMT LEADCHNL RA PACING THRESHOLD AMPLITUDE: 0.625 V
MDC IDC MSMT LEADCHNL RA SENSING INTR AMPL: 1.4 mV
MDC IDC MSMT LEADCHNL RV IMPEDANCE VALUE: 1241 Ohm
MDC IDC MSMT LEADCHNL RV PACING THRESHOLD AMPLITUDE: 1.25 V
MDC IDC SET LEADCHNL RV SENSING SENSITIVITY: 5.6 mV
MDC IDC STAT BRADY AS VS PERCENT: 29 %

## 2015-02-22 ENCOUNTER — Other Ambulatory Visit: Payer: Self-pay

## 2015-02-22 ENCOUNTER — Telehealth: Payer: Self-pay | Admitting: Internal Medicine

## 2015-02-22 MED ORDER — METFORMIN HCL ER 500 MG PO TB24: 500.0000 mg | ORAL_TABLET | Freq: Every day | ORAL | Status: DC

## 2015-02-22 NOTE — Telephone Encounter (Signed)
erx done

## 2015-02-22 NOTE — Telephone Encounter (Signed)
Pt called in needs refill on her metFORMIN (GLUCOPHAGE-XR) 500 MG 24 hr tablet [[id number]]  Please send to [facility] mail order pharmacy

## 2015-02-25 ENCOUNTER — Telehealth: Payer: Self-pay | Admitting: Internal Medicine

## 2015-02-25 NOTE — Telephone Encounter (Signed)
Pt sent transmission 02-02-15 , hasn't gotten a letter to state if it was received-pls call

## 2015-02-25 NOTE — Telephone Encounter (Signed)
Spoke w/ pt and informed her that her transmission was received on 02-03-15. She wanted to know when she needed to send another transmission. I informed her that she was due to see MD. Pt agreed to appt on 05-10-15 at 11:30 AM.

## 2015-03-04 ENCOUNTER — Other Ambulatory Visit: Payer: Self-pay

## 2015-03-04 MED ORDER — SOLIFENACIN SUCCINATE 5 MG PO TABS: 5.0000 mg | ORAL_TABLET | Freq: Every day | ORAL | Status: DC

## 2015-03-10 DIAGNOSIS — F3181 Bipolar II disorder: Secondary | ICD-10-CM | POA: Diagnosis not present

## 2015-03-17 ENCOUNTER — Encounter: Payer: Self-pay | Admitting: Cardiology

## 2015-03-23 ENCOUNTER — Encounter: Payer: Self-pay | Admitting: Internal Medicine

## 2015-03-30 ENCOUNTER — Other Ambulatory Visit: Payer: Self-pay

## 2015-03-30 MED ORDER — DONEPEZIL HCL 10 MG PO TABS: 10.0000 mg | ORAL_TABLET | Freq: Every day | ORAL | Status: DC

## 2015-03-31 ENCOUNTER — Encounter: Payer: Self-pay | Admitting: Cardiology

## 2015-04-23 ENCOUNTER — Other Ambulatory Visit: Payer: Self-pay | Admitting: Internal Medicine

## 2015-04-27 DIAGNOSIS — F3181 Bipolar II disorder: Secondary | ICD-10-CM | POA: Diagnosis not present

## 2015-05-10 ENCOUNTER — Ambulatory Visit (INDEPENDENT_AMBULATORY_CARE_PROVIDER_SITE_OTHER): Payer: Commercial Managed Care - HMO | Admitting: Internal Medicine

## 2015-05-10 ENCOUNTER — Encounter: Payer: Commercial Managed Care - HMO | Admitting: Internal Medicine

## 2015-05-10 ENCOUNTER — Encounter: Payer: Self-pay | Admitting: Internal Medicine

## 2015-05-10 VITALS — BP 130/90 | HR 61 | Ht 66.0 in | Wt 119.8 lb

## 2015-05-10 DIAGNOSIS — I442 Atrioventricular block, complete: Secondary | ICD-10-CM | POA: Diagnosis not present

## 2015-05-10 DIAGNOSIS — Z950 Presence of cardiac pacemaker: Secondary | ICD-10-CM | POA: Diagnosis not present

## 2015-05-10 DIAGNOSIS — Z45018 Encounter for adjustment and management of other part of cardiac pacemaker: Secondary | ICD-10-CM

## 2015-05-10 LAB — CUP PACEART INCLINIC DEVICE CHECK
Battery Impedance: 4350 Ohm
Battery Remaining Longevity: 8 mo
Battery Voltage: 2.65 V
Brady Statistic AP VP Percent: 11 %
Brady Statistic AS VS Percent: 30 %
Date Time Interrogation Session: 20161003164409
Lead Channel Impedance Value: 603 Ohm
Lead Channel Pacing Threshold Amplitude: 1 V
Lead Channel Pacing Threshold Amplitude: 1.25 V
Lead Channel Pacing Threshold Pulse Width: 0.4 ms
Lead Channel Sensing Intrinsic Amplitude: 4 mV
Lead Channel Setting Pacing Amplitude: 2 V
Lead Channel Setting Pacing Amplitude: 2.5 V
Lead Channel Setting Pacing Pulse Width: 0.4 ms
Lead Channel Setting Sensing Sensitivity: 4 mV
MDC IDC MSMT LEADCHNL RV IMPEDANCE VALUE: 1202 Ohm
MDC IDC MSMT LEADCHNL RV PACING THRESHOLD PULSEWIDTH: 0.4 ms
MDC IDC MSMT LEADCHNL RV SENSING INTR AMPL: 11.2 mV
MDC IDC STAT BRADY AP VS PERCENT: 59 %
MDC IDC STAT BRADY AS VP PERCENT: 0 %

## 2015-05-10 NOTE — Patient Instructions (Addendum)
Medication Instructions:  Your physician recommends that you continue on your current medications as directed. Please refer to the Current Medication list given to you today.  Labwork: None ordered  Testing/Procedures: None ordered  Follow-Up: We will monitor your battery monthly as it is nearing elective replacement.  Next remote date, from home, is on 06/10/15.  Any Other Special Instructions Will Be Listed Below (If Applicable). Thank you for choosing [facility]!!

## 2015-05-10 NOTE — Progress Notes (Signed)
Patient Care Team: Rowe Clack, MD as PCP - General Deboraha Sprang, MD (Cardiology) Sherrlyn Hock, MD (Endocrinology) Dennard Nip, NP as Nurse Practitioner (Psychiatry) Sable Feil, MD (Gastroenterology) Thalia Bloodgood, OD (Optometry) Berle Mull, MD (Sports Medicine) Eustace Quail Tat, DO (Neurology)   HPI  Stacy [name] is a 74 y.o. female seen in followup for bradycardia and intermittent complete heart block. She is status post pacemaker implantation.  She has a history of coronary disease and has had problems with recurrent chest pain. We undertook Myoview scan in the 2010. She then underwent catheterization demonstrating no obstruction; her previously implanted stent was patent and her ejection fraction was normal    She is smoking a little bit less  The patient denies chest pain, shortness of breath, nocturnal dyspnea, orthopnea or peripheral edema.  There have been no palpitations, lightheadedness or syncope.         Past Medical History  Diagnosis Date  . Chronic bipolar disorder ([place])   . Coronary artery disease     s/p PTCA  . Complete AV block ([place])     s/p PPM--MEDTRONIC ADAPTA ADDr01  . Takotsubo syndrome   . GERD (gastroesophageal reflux disease)   . COPD (chronic obstructive pulmonary disease) ([facility])     TOBACCO ABUSE  . Abscess of liver(572.0)   . Atrial fibrillation ([facility])     chronic anticoag - pradaxa  . BIPOLAR AFFECTIVE DISORDER   . DEPRESSION   . DIABETES MELLITUS, TYPE II dx 04/2010  . DYSLIPIDEMIA   . HYPERTENSION   . HYPOTHYROIDISM     hashimoto's  . TOBACCO ABUSE   . Pacemaker-Medtronic-dual-chamber   . Parathyroid related hypercalcemia ([place])   . Primary hyperparathyroidism ([place])   . Vitamin D deficiency disease   . Cancer ([place])     basal cell on abdomen    Past Surgical History  Procedure Laterality Date  . Insert / replace / remove pacemaker      MEDTRONIC ADAPTA ADDr01  . Parathyroidectomy      RIGHT  INFERIOR  . Abdominal hysterectomy    . Cardiac catheterization    . Mass excision Left 01/27/2013    Procedure: EXCISION MASS LEFT FLANK;  Surgeon: Earnstine Regal, MD;  Location: [place];  Service: General;  Laterality: Left;    Current Outpatient Prescriptions  Medication Sig Dispense Refill  . atorvastatin (LIPITOR) 80 MG tablet TAKE 1 TABLET EVERY DAY  AT  6:00  PM 90 tablet 3  . Calcium Carbonate (CALCIUM 500 PO) Take 1 tablet by mouth at bedtime.     Marland Kitchen desoximetasone (TOPICORT) 0.05 % cream Apply 1 application topically 2 (two) times daily as needed (itching).    Marland Kitchen donepezil (ARICEPT) 10 MG tablet Take 1 tablet (10 mg total) by mouth at bedtime. 90 tablet 1  . FLUoxetine (PROZAC) 10 MG capsule Take 10 mg by mouth daily.    Marland Kitchen lithium carbonate 300 MG capsule Take 300 mg by mouth daily.    Marland Kitchen loratadine (CLARITIN) 10 MG tablet Take 1 tablet (10 mg total) by mouth daily. 90 tablet 3  . losartan (COZAAR) 50 MG tablet Take 1 tablet (50 mg total) by mouth daily. 90 tablet 0  . Melatonin 5 MG CAPS Take 5 mg by mouth at bedtime.    . metFORMIN (GLUCOPHAGE-XR) 500 MG 24 hr tablet Take 1 tablet (500 mg total) by mouth daily with breakfast. 90 tablet 3  . Multiple Vitamin (MULTIVITAMIN)  tablet Take 1 tablet by mouth daily.     Marland Kitchen omeprazole (PRILOSEC) 40 MG capsule TAKE 1 CAPSULE EVERY DAY  BEFORE  BREAKFAST 90 capsule 3  . PRADAXA 150 MG CAPS capsule TAKE 1 CAPSULE EVERY 12 HOURS 180 capsule 0  . Propylene Glycol (SYSTANE BALANCE OP) Apply 1 drop to eye at bedtime.    . solifenacin (VESICARE) 5 MG tablet Take 1 tablet (5 mg total) by mouth daily. 90 tablet 3  . SYNTHROID 112 MCG tablet take 1 tablet by mouth once daily 10 tablet 1   No current facility-administered medications for this visit.    Allergies  Allergen Reactions  . Codeine     REACTION: vomiting  . Latex Other (See Comments)    irritation  . Metronidazole Other (See Comments)    Reaction unknown  . Other     Bandaids cause  rash  . Penicillins     REACTION: throat swelling, rash    Review of Systems negative except from HPI and PMH  Physical Exam BP 130/90 mmHg  Pulse 61  Ht 5\' 6"  (1.676 m)  Wt 119 lb 12.8 oz (54.341 kg)  BMI 19.35 kg/m2 Well developed and nourished in no acute distress HENT normal Neck supple with JVP-flat Clear Regular rate and rhythm, no murmurs or gallops Abd-soft with active BS No Clubbing cyanosis edema Skin-warm and dry A & Oriented  Grossly normal sensory and motor function  r   ECG demonstrates AV pacing Assessment and  Plan  Sinus node dysfunction   coronary artery disease with prior stenting  Pacemaker-Medtronic The patient's device was interrogated.  The information was reviewed. No changes were made in the programming.     Hypertension  Syncope  Fall  atrialf ibrillation    She has recurrent atrial fibrillation. This is unassociated with tachypalpitations.  Without symptoms of ischemia  BP well controlled   Device is approaching ERI.

## 2015-05-24 ENCOUNTER — Telehealth: Payer: Self-pay | Admitting: *Deleted

## 2015-05-24 NOTE — Telephone Encounter (Signed)
Spoke to patient. I advised that per Dr. Tobe Sos, he cannot sign off on diabetic shoes since she has no evidence of neuropathy. He advises that she speak to her PCP.

## 2015-05-31 ENCOUNTER — Telehealth: Payer: Self-pay | Admitting: *Deleted

## 2015-05-31 MED ORDER — OMEPRAZOLE 40 MG PO CPDR: DELAYED_RELEASE_CAPSULE | ORAL | Status: DC

## 2015-05-31 NOTE — Telephone Encounter (Signed)
Receive call pt is needing refill sent to [facility] on her omeprazole. Inform pt sending electronically...[name]

## 2015-06-01 ENCOUNTER — Other Ambulatory Visit: Payer: Self-pay | Admitting: *Deleted

## 2015-06-01 DIAGNOSIS — E039 Hypothyroidism, unspecified: Secondary | ICD-10-CM

## 2015-06-01 DIAGNOSIS — E038 Other specified hypothyroidism: Secondary | ICD-10-CM | POA: Diagnosis not present

## 2015-06-01 DIAGNOSIS — E559 Vitamin D deficiency, unspecified: Secondary | ICD-10-CM

## 2015-06-01 LAB — TSH: TSH: 1.632 u[IU]/mL (ref 0.350–4.500)

## 2015-06-01 LAB — LIPID PANEL
CHOL/HDL RATIO: 1.6 ratio (ref <=5.0)
CHOLESTEROL: 99 mg/dL — AB (ref 125–200)
HDL: 63 mg/dL (ref >=46)
LDL Cholesterol: 13 mg/dL (ref <130)
TRIGLYCERIDES: 116 mg/dL (ref <150)
VLDL: 23 mg/dL (ref <30)

## 2015-06-01 LAB — T4, FREE: Free T4: 1.1 ng/dL (ref 0.80–1.80)

## 2015-06-01 LAB — T3, FREE: T3, Free: 2.5 pg/mL (ref 2.3–4.2)

## 2015-06-02 LAB — VITAMIN D 25 HYDROXY (VIT D DEFICIENCY, FRACTURES): VIT D 25 HYDROXY: 45 ng/mL (ref 30–100)

## 2015-06-02 LAB — PTH, INTACT AND CALCIUM
Calcium: 9.8 mg/dL (ref 8.4–10.5)
PTH: 37 pg/mL (ref 14–64)

## 2015-06-07 ENCOUNTER — Telehealth: Payer: Self-pay | Admitting: Family Medicine

## 2015-06-07 NOTE — Telephone Encounter (Signed)
Please submit [employer] referral for 06/17/2015 visit with dr Tamala Julian

## 2015-06-08 ENCOUNTER — Encounter: Payer: Self-pay | Admitting: "Endocrinology

## 2015-06-08 ENCOUNTER — Ambulatory Visit (INDEPENDENT_AMBULATORY_CARE_PROVIDER_SITE_OTHER): Payer: Commercial Managed Care - HMO | Admitting: "Endocrinology

## 2015-06-08 VITALS — BP 138/78 | HR 84 | Wt 119.8 lb

## 2015-06-08 DIAGNOSIS — E119 Type 2 diabetes mellitus without complications: Secondary | ICD-10-CM

## 2015-06-08 DIAGNOSIS — E049 Nontoxic goiter, unspecified: Secondary | ICD-10-CM

## 2015-06-08 DIAGNOSIS — E063 Autoimmune thyroiditis: Secondary | ICD-10-CM

## 2015-06-08 DIAGNOSIS — E559 Vitamin D deficiency, unspecified: Secondary | ICD-10-CM

## 2015-06-08 DIAGNOSIS — E782 Mixed hyperlipidemia: Secondary | ICD-10-CM

## 2015-06-08 DIAGNOSIS — R634 Abnormal weight loss: Secondary | ICD-10-CM | POA: Diagnosis not present

## 2015-06-08 DIAGNOSIS — F3161 Bipolar disorder, current episode mixed, mild: Secondary | ICD-10-CM

## 2015-06-08 DIAGNOSIS — E211 Secondary hyperparathyroidism, not elsewhere classified: Secondary | ICD-10-CM

## 2015-06-08 DIAGNOSIS — I10 Essential (primary) hypertension: Secondary | ICD-10-CM

## 2015-06-08 DIAGNOSIS — E038 Other specified hypothyroidism: Secondary | ICD-10-CM | POA: Diagnosis not present

## 2015-06-08 LAB — HEMOGLOBIN A1C
Hgb A1c MFr Bld: 6.1 % — ABNORMAL HIGH (ref <5.7)
Mean Plasma Glucose: 128 mg/dL — ABNORMAL HIGH (ref <117)

## 2015-06-08 LAB — VITAMIN B12: Vitamin B-12: 1500 pg/mL — ABNORMAL HIGH (ref 211–911)

## 2015-06-08 MED ORDER — SYNTHROID 112 MCG PO TABS: 112.0000 ug | ORAL_TABLET | Freq: Every day | ORAL | Status: DC

## 2015-06-08 NOTE — Progress Notes (Addendum)
Subjective:  Patient Name: Stacy [name] Date of Birth: [birth year]  MRN: [id number]  Stacy [name]  presents to the office today for follow-up of her T2DM, hypothyroidism, thyroiditis, hypertension, fatigue, secondary hyperparathyroidism, vitamin D deficiency, hyperlipidemia, tobacco abuse, and tremor.  HISTORY OF PRESENT ILLNESS:   Stacy [name] is a 74 y.o. Caucasian woman.  Stacy [name] was unaccompanied.  1. Ms.Schnyder was first referred to me on 09/04/05 for evaluation and management of chronic hypothyroidism and new hypercalcemia and hyperparathyroidism. In brief, she developed vitamin D deficiency, then secondary hyperparathyroidism, and then tertiary hyperparathyroidism. Her right inferior parathyroid gland was quite enlarged. She was taken to surgery on 12/07/05 for parathyroidectomy. Since that time she's done well, provided that she has continued to take her calcium and vitamin D.  She has had some waxing and waning of her thyroid gland size during these years, which is consistent with inflammatory flare-ups of her Hashimoto's disease. We've increased her thyroid hormone dose slightly over time. She now takes 112 mcg of Synthroid daily. She has remained euthyroid most of the time.   2. The patient has experienced many other problems during the last 8 years, to include bipolar disorder, GERD, fatigue, iron deficiency, 2 heart attacks, hypertension, orthostatic hypotension, atrial fibrillation, and the need for a cardiac pacemaker. We've helped to manage some of those problems and kept her as supplied as we could with samples of medications. She was diagnosed with type 2 diabetes in early 2012 and was started on metformin 500 mg once daily. She was also started on atorvastatin for hyperlipidemia.  3. The patient's last [facility] visit was on 12/01/14. In the interim, she has been healthy and has been doing well emotionally, but she has had more problems with arthritis in her left ankle, knee, and hip. She is due  to see an orthopedist soon. She has had to reduce her walking as a result. She thinks that she is eating less over time. She is taking Synthroid, 112 mcg/day and metformin, 500 mg, once daily. She no longer takes propranolol every day to attempt to control her tremor.  She also takes atorvastatin, calcium carbonate, fluoxetine, lithium, losartan, and solifenacin. She has intentionally reduced her intake of sweets and other carbs, to include honey. She has not had any problems with vertigo recently. Her allergies have bothered her fairly consistently.    4. Pertinent Review of Systems:  Constitutional: The patient feel, "pretty good, really".  She has not had many problems with insomnia and early awakening while taking melatonin.  She also has frequent nocturia. Her energy level is pretty good. She has not been walking much. Her attitude and mood have been good.  Eyes: Vision is good as long as she wears her glasses. She had her last eye exam in about early September. No eye disease was noted. There are no other significant eye complaints. Neck: The patient has no complaints of anterior neck swelling, soreness, tenderness,  pressure, discomfort, or difficulty swallowing.  Heart: Her pacemaker is still working, but will probably need to be replaced in the next 6-12 months. She has no complaints of palpitations, irregular heat beats, chest pain, or chest pressure. Gastrointestinal: Her stomach occasionally "growls". She has intermittent constipation and loose stools. The patient has no complaints of acid reflux, upset stomach, stomach aches or pains or constipation. Arms: She occasionally still has pain in her left shoulder (trapezius areas) when lying on her left side.  Legs: She has more pains in her left hip, left knee, and left  ankle. Muscle mass and strength seem normal. There are no complaints of numbness, tingling, or burning. No edema is noted. Feet: There are no obvious foot problems. There are no  complaints of numbness, tingling, burning, or pain. No edema is noted. Hypoglycemia: None  5. BG printout: She forgot her BG meter today. Her BG are usually in the 90s, but can rise to 110 after candy.    PAST MEDICAL, FAMILY, AND SOCIAL HISTORY:  Past Medical History  Diagnosis Date  . Chronic bipolar disorder ([facility])   . Coronary artery disease     s/p PTCA  . Complete AV block ([place])     s/p PPM--MEDTRONIC ADAPTA [id number]  . Takotsubo syndrome   . GERD (gastroesophageal reflux disease)   . COPD (chronic obstructive pulmonary disease) ([facility])     TOBACCO ABUSE  . Abscess of liver(572.0)   . Atrial fibrillation ([facility])     chronic anticoag - pradaxa  . BIPOLAR AFFECTIVE DISORDER   . DEPRESSION   . DIABETES MELLITUS, TYPE II dx 04/2010  . DYSLIPIDEMIA   . HYPERTENSION   . HYPOTHYROIDISM     hashimoto's  . TOBACCO ABUSE   . Pacemaker-Medtronic-dual-chamber   . Parathyroid related hypercalcemia ([place])   . Primary hyperparathyroidism ([place])   . Vitamin D deficiency disease   . Cancer ([place])     basal cell on abdomen    Family History  Problem Relation Age of Onset  . CAD Mother   . Colon cancer      FH  . Diabetes Maternal Aunt   . CAD Father   . Diabetes type II Sister      Current outpatient prescriptions:  .  atorvastatin (LIPITOR) 80 MG tablet, TAKE 1 TABLET EVERY DAY  AT  6:00  PM, Disp: 90 tablet, Rfl: 3 .  Calcium Carbonate (CALCIUM 500 PO), Take 1 tablet by mouth at bedtime. , Disp: , Rfl:  .  desoximetasone (TOPICORT) 0.05 % cream, Apply 1 application topically 2 (two) times daily as needed (itching)., Disp: , Rfl:  .  donepezil (ARICEPT) 10 MG tablet, Take 1 tablet (10 mg total) by mouth at bedtime., Disp: 90 tablet, Rfl: 1 .  FLUoxetine (PROZAC) 10 MG capsule, Take 10 mg by mouth daily., Disp: , Rfl:  .  lithium carbonate 300 MG capsule, Take 300 mg by mouth daily., Disp: , Rfl:  .  loratadine (CLARITIN) 10 MG tablet, Take 1 tablet (10 mg total) by mouth daily.,  Disp: 90 tablet, Rfl: 3 .  losartan (COZAAR) 50 MG tablet, Take 1 tablet (50 mg total) by mouth daily., Disp: 90 tablet, Rfl: 0 .  Melatonin 5 MG CAPS, Take 5 mg by mouth at bedtime., Disp: , Rfl:  .  metFORMIN (GLUCOPHAGE-XR) 500 MG 24 hr tablet, Take 1 tablet (500 mg total) by mouth daily with breakfast., Disp: 90 tablet, Rfl: 3 .  Multiple Vitamin (MULTIVITAMIN) tablet, Take 1 tablet by mouth daily. , Disp: , Rfl:  .  omeprazole (PRILOSEC) 40 MG capsule, TAKE 1 CAPSULE EVERY DAY  BEFORE  BREAKFAST, Disp: 90 capsule, Rfl: 3 .  PRADAXA 150 MG CAPS capsule, TAKE 1 CAPSULE EVERY 12 HOURS, Disp: 180 capsule, Rfl: 0 .  Propylene Glycol (SYSTANE BALANCE OP), Apply 1 drop to eye at bedtime., Disp: , Rfl:  .  solifenacin (VESICARE) 5 MG tablet, Take 1 tablet (5 mg total) by mouth daily., Disp: 90 tablet, Rfl: 3 .  SYNTHROID 112 MCG tablet, Take 1 tablet (112 mcg total)  by mouth daily. Take one 112 mcg brand Synthroid tablet daily., Disp: 90 tablet, Rfl: 3  Allergies as of 06/08/2015 - Review Complete 06/08/2015  Allergen Reaction Noted  . Codeine    . Latex Other (See Comments) 05/03/2012  . Metronidazole Other (See Comments) 03/26/2008  . Other  01/24/2013  . Penicillins      1. Work and Family: Her daughter, [name], lives with her. [name] graduated from nursing school since [place] last visit.  2. Activities: Quinlee has been walking less due to her arthritis.      3. Smoking, alcohol, or drugs: She stopped smoking, but uses a nicotine-free vapor cigarette at times.   4. Primary Care Provider: Gwendolyn Grant, MD  5. Psych: Comer Locket, PA, [facility], office [phone], fax [phone]  REVIEW OF SYSTEMS: There are no other significant problems involving Leighla's other body systems.   Objective:  Vital Signs:  BP 138/78 mmHg  Pulse 84  Wt 119 lb 12.8 oz (54.341 kg)   Ht Readings from Last 3 Encounters:  05/10/15 5\' 6"  (1.676 m)  10/07/14 5\' 6"  (1.676 m)   08/03/14 5\' 5"  (1.651 m)   Wt Readings from Last 3 Encounters:  06/08/15 119 lb 12.8 oz (54.341 kg)  05/10/15 119 lb 12.8 oz (54.341 kg)  12/01/14 123 lb (55.792 kg)   PHYSICAL EXAM:  Constitutional: The patient appears healthy and well nourished. She looks good.  Her weight has decreased by 4 pounds in 6 months. Her mood and affect are normal. Her insight is good.  Head: Her head tremor is still present, a bit more obvious since stopping the propranolol. Face: The face appears normal.  Eyes: There is no obvious arcus or proptosis. Moisture appears normal. Mouth: The oropharynx and tongue appear normal. Oral moisture is normal. Neck: The neck appears to be visibly normal. No carotid bruits are noted. She has a low-lying thyroid gland. The thyroid gland is within normal size at 18-20 grams. Both lobes today are within normal limits for size. The consistency of the thyroid gland is normal. The thyroid gland is not tender to palpation. Lungs: The lungs are clear to auscultation. Air movement is good. Heart: Heart rate and rhythm are regular. Heart sounds S1 and S2 are normal. I did not appreciate any pathologic cardiac murmurs. Abdomen: The abdomen is mildly enlarged. Bowel sounds are normal. There is no obvious hepatomegaly, splenomegaly, or other mass effect.  Arms: Muscle size and bulk are normal for age.  Hands: She has a 2+ hand tremor. Phalangeal and metacarpophalangeal joints are normal. Palmar muscles are normal. Palmar skin is normal. Palmar moisture is also normal. Fingernails are somewhat pale. Legs: Muscles appear normal for age. No edema is present. Feet: She has a 1+ dorsalis pedis pulses bilaterally.  Neurologic: Strength is normal for age in both the upper and lower extremities. Muscle tone is normal. Sensation to touch is normal in both legs and feet. She walked normally, without any of the hesitation and rigidity usually seen with Parkinson's disease.   LAB DATA:   Labs  06/01/15: TSH 1.632, free T4 1.10, free T3 2.5; PTH 37, calcium 9.8, 25-OH vitamin D 45; cholesterol 99, triglycerides 116, HDL 63, LDL 13  Labs 12/01/14: Hemoglobin A1c was 5.7%.compared with 5.6% at last visit, and with 6.1% at the visit prior.    Labs 10/07/14: Calcium 9.9, PTH 49, 25-OH vitamin D 53; cholesterol 109, triglycerides 109, HDL 69, LDL 18; TSH 3.734, free T4; lithium 0.60 (0.80-1.40)  Labs 12/15/13: TSH 4.984, free T4 1.11, free T3 2.6; lithium 0.60  Labs 11/05/13: Cholesterol 117, triglycerides 112, HDL 63.5, LDL 31; TSH 4.84  Labs 05/05/13: TSH 3.575, free T4 1.46, free T3 2.5  Labs 01/30/13: Cholesterol 93, triglycerides 61, HDL 56, LDL 24  Labs 10/30/12: TSH 2.550, free T4 1.24, free T3 2.5; calcium 10.4, PTH 36.3, 24-hydroxy vitamin D 42; lithium 0.80 (0.80-1.40)   Labs 04/18/12: TSH was 2.106, Free T4 was 1.41. Free T3 was 2.8.                   Labs 10/25/11: PTH 30.8, calcium 10.4, 25-hydroxy vitamin D 43; WBC 7.8, hemoglobin 14.9, hematocrit 46.9%, iron 71             Assessment and Plan:   ASSESSMENT:  1&2. Hypothyroidism/ Hashimoto's thyroiditis:   A. Her TFTS in September 2014, at first glance, did not make sense. This pattern was c/w a recent flare up of Hashimoto's Dz. We could not make any decisions about whether or not to change her Synthroid dose until the TFTs stabilized.   B. Her TFTs in April 2015 were mildly low. Since she was in the midst of an episode of lithium toxicity and may have missed some Synthroid doses, I decided not to change her Synthroid dose.    C. Her TFTs in March 2016 were slightly low. Her TFTs in October, however, were mid-range normal on her current dose of Synthroid.    D. Her thyroiditis is again clinically quiescent  3. Hypertension: Her blood pressure is higher today, in part due to pain and in part due to not exercising. I suggested that she try to use her exercise bike.     4. T2DM/prediabetes: Since our HbA1c machine is not working  today, we need to re-check her A1c. By history her BGs have been quite good. Her reduction in carb intake and her continuing to take metformin have worked together in the past to improve her BG control. We do not need to increase her metformin to twice daily.  5.  Fatigue: She is really feeling very well. 6.  Hyperparathyroidism and vitamin D deficiency: Her labs in March and October 2016 were normal. She was very compliant with taking her Caltrate-D and MVI each evening.   7. Tobacco abuse: She stopped smoking, but instead is using a "nicotine-free" vapor cigarette. 8. Tremor: Her tremor is not due to Parkinson's Dz. She was doing better with propranolol. 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 9. Pallor: Her pallor has resolved. Her iron level, hemoglobin, and hematocrit were fine 2 years ago.  10. Goiter: Her thyroid gland is again within normal size today.  11. Hyperlipidemia: Her lipids were very good in March and October 2016. 12. Bipolar disorder: She looks well, but her lithium level in March was low. I ordered a lithium level last week, but there is no result thus far.  13. Unintentional weight loss:  She is probably losing weight due to not eating as much as she did previously. However, we need to check her vitamin B12 level.   PLAN:  1. Diagnostic:  HbA1c, vitamin B6, vitamin B12. 2. Therapeutic:  Eat Right, exercise right (walk 30-60 minutes per day or use her exercise bike), and take your Synthroid, calcium, and metformin.  3. Patient education: We talked a lot about her tremor, her smoking, and her thyroiditis and hypothyroidism.  4. Follow-up: 6 months with me. Please schedule FU visits with Dr. Asa Lente  or her replacement as she desires.  Level of Service: This visit lasted in excess of 55 minutes. More than 50% of the visit was devoted to counseling.   Sherrlyn Hock, MD 06/08/2015 10:26 AM   Addendum 06/09/15:   Labs 06/08/15: HbA1c 6.1%, lithium 0.50 (normal 0.80-1.40),  vitamin B12 1500 (normal 211-911), vitamin B6 pending   Assessment:  1. T2DM: Her HbA1c is higher, most likely due to her decrease in exercise in the past two months. We will need to follow her A1c and her C-peptide level over time to ensure that she does not have Latent Autoimmune Diabetes of Adults, the very slowly evolving form of T1DM. 2. Her lithium level is below the usual therapeutic range, but she is really doing quite well. I will send a copy of this note to her PA, Mr. Jacques Earthly.  3. Her vitamin B12 level is above the upper limit of normal as defined by the [employer]. She is obviously taking her MVI and her metformin is not causing any Vitamin B12 deficiency.  A review of Vitamin B12 by the NIH Office of Dietary Supplements noted a review by the Institute of Medicine that stated "no adverse effects have been associated with excess vitamin B12 intake from food and supplements in healthy individuals". At this point in time I do not see a need to change her MVI dose.  4. I will discuss all of the above with her when I see her again on 06/11/15.  Sherrlyn Hock

## 2015-06-08 NOTE — Patient Instructions (Signed)
Follow up visit in 6 months. 

## 2015-06-09 LAB — LITHIUM LEVEL: Lithium Lvl: 0.5 mEq/L — ABNORMAL LOW (ref 0.80–1.40)

## 2015-06-10 ENCOUNTER — Ambulatory Visit (INDEPENDENT_AMBULATORY_CARE_PROVIDER_SITE_OTHER): Payer: Commercial Managed Care - HMO | Admitting: *Deleted

## 2015-06-10 ENCOUNTER — Telehealth: Payer: Self-pay | Admitting: Cardiology

## 2015-06-10 DIAGNOSIS — Z950 Presence of cardiac pacemaker: Secondary | ICD-10-CM

## 2015-06-10 NOTE — Telephone Encounter (Signed)
Spoke with pt and reminded pt of remote transmission that is due today. Pt verbalized understanding.   

## 2015-06-11 NOTE — Telephone Encounter (Signed)
Stacy [name] Stacy [name] #[id number] valid 06/17/15-12/14/15 for 6 visits. Patient is aware.

## 2015-06-11 NOTE — Progress Notes (Signed)
Remote pacemaker transmission.   

## 2015-06-13 LAB — VITAMIN B6: Vitamin B6: 20.5 ng/mL (ref 2.1–21.7)

## 2015-06-15 ENCOUNTER — Ambulatory Visit: Payer: Commercial Managed Care - HMO | Admitting: Family

## 2015-06-17 ENCOUNTER — Ambulatory Visit (INDEPENDENT_AMBULATORY_CARE_PROVIDER_SITE_OTHER): Payer: Commercial Managed Care - HMO | Admitting: Family Medicine

## 2015-06-17 ENCOUNTER — Other Ambulatory Visit (INDEPENDENT_AMBULATORY_CARE_PROVIDER_SITE_OTHER): Payer: Commercial Managed Care - HMO

## 2015-06-17 ENCOUNTER — Encounter: Payer: Self-pay | Admitting: Family Medicine

## 2015-06-17 VITALS — BP 122/70 | HR 87 | Ht 66.0 in | Wt 121.0 lb

## 2015-06-17 DIAGNOSIS — M25552 Pain in left hip: Secondary | ICD-10-CM | POA: Diagnosis not present

## 2015-06-17 DIAGNOSIS — M7062 Trochanteric bursitis, left hip: Secondary | ICD-10-CM | POA: Diagnosis not present

## 2015-06-17 NOTE — Progress Notes (Signed)
[name] [place], [place] [address] Phone: [phone] Subjective:    I'm seeing this patient by the request  of:  Gwendolyn Grant, MD   CC: left hip pain  [id number] Stacy [name] is a 74 y.o. female coming in with complaint of left hip pain. Patient states that she has had left hip pain for approximately 1 month or 2 months. Patient started to do a walking regimen and started noticing at that time. Seems to be more on the lateral aspect leg and denies any groin pain. States that it does seem to radiate down the lateral aspect the leg from time to time. Denies any numbness. States that she has weakness over all but this just because she feels that she is becoming more mature. Patient states that the hip does wake her up when she rolls on it at night or puts any pressure in the area. Patient rates the severity of pain a 7 out of 10. Has not tried any home modalities at this time.     Past medical history, social, surgical and family history all reviewed in electronic medical record.   Review of Systems: No headache, visual changes, nausea, vomiting, diarrhea, constipation, dizziness, abdominal pain, skin rash, fevers, chills, night sweats, weight loss, swollen lymph nodes, body aches, joint swelling, muscle aches, chest pain, shortness of breath, mood changes.   Objective Blood pressure 122/70, pulse 87, weight 121 lb (54.885 kg), SpO2 93 %.  General: No apparent distress alert and oriented x3 mood and affect normal, dressed appropriately.  HEENT: Pupils equal, extraocular movements intact  Respiratory: Patient's speak in full sentences and does not appear short of breath  Cardiovascular: No lower extremity edema, non tender, no erythema  Skin: Warm dry intact with no signs of infection or rash on extremities or on axial skeleton.  Abdomen: Soft nontender  Neuro: Cranial nerves II through XII are intact, neurovascularly intact in all  extremities with 2+ DTRs and 2+ pulses.  Lymph: No lymphadenopathy of posterior or anterior cervical chain or axillae bilaterally.  Gait normal with good balance and coordination.  MSK:  Non tender with full range of motion and good stability and symmetric strength and tone of shoulders, elbows, wrist,  knee and ankles bilaterally.  [id number] ROM IR: 15 Deg, ER: 35 Deg, Flexion: 120 Deg, Extension: 100 Deg, Abduction: 45 Deg, Adduction: 25 Deg Strength IR: 4/5, ER: 4/5, Flexion: 5/5, Extension: 4/5, Abduction: 3+/5, Adduction: 5/5 Pelvic alignment unremarkable to inspection and palpation. Standing hip rotation and gait without trendelenburg sign / unsteadiness. Greater trochanter severe tenderness. No tenderness over piriformis  Positive Faber on left side No SI joint tenderness and normal minimal SI movement.  MSK US performed of: Right This study was ordered, performed, and interpreted by Charlann Boxer D.O.  Hip: Trochanteric bursa with significant hypoechoic changes and swelling Acetabular labrum visualized and without tears, displacement, or effusion in joint. Femoral neck appears unremarkable without increased power doppler signal along Cortex.  IMPRESSION:  Greater trochanter bursitis   Procedure: Real-time Ultrasound Guided Injection of right greater trochanteric bursitis secondary to patient's body habitus Device: GE Logiq E  Ultrasound guided injection is preferred based studies that show increased duration, increased effect, greater accuracy, decreased procedural pain, increased response rate, and decreased cost with ultrasound guided versus blind injection.  Verbal informed consent obtained.  Time-out conducted.  Noted no overlying erythema, induration, or other signs of local infection.  Skin prepped in a sterile  fashion.  Local anesthesia: Topical Ethyl chloride.  With sterile technique and under real time ultrasound guidance:  Greater trochanteric area was visualized and  patient's bursa was noted. A 22-gauge 3 inch needle was inserted and 4 cc of 0.5% Marcaine and 1 cc of Kenalog 40 mg/dL was injected. Pictures taken Completed without difficulty  Pain immediately resolved suggesting accurate placement of the medication.  Advised to call if fevers/chills, erythema, induration, drainage, or persistent bleeding.  Images permanently stored and available for review in the ultrasound unit.  Impression: Technically successful ultrasound guided injection.     Impression and Recommendations:     This case required medical decision making of moderate complexity.

## 2015-06-17 NOTE — Assessment & Plan Note (Signed)
Patient was given an injection today and tolerated the procedure very well. We discussed icing regimen and home exercises. Discussed which activities to do an which was to potentially avoid. We discussed topical anti-inflammatories and was given some. Patient knows if she starts having increasing bruising to discontinue. Patient was having radiation towards the knee and is concern it is her knee but I do not think that this is causing the significant discomfort at this time. Patient will come back in 3-4 weeks for further evaluation and treatment.

## 2015-06-17 NOTE — Progress Notes (Signed)
Pre visit review using our clinic review tool, if applicable. No additional management support is needed unless otherwise documented below in the visit note. 

## 2015-06-17 NOTE — Patient Instructions (Addendum)
Good to see you Ice 20 minutes 2 times daily. Usually after activity and before bed. Exercises 3 times a week.  pennsaid pinkie amount topically 2 times daily as needed.  Vitamin D 1000 IU daily extra from what you are having.  Iron 65mg  with 500mg  of vitamin C 3 times a week can help See me again in 3-4 weeks.   If worsening symptoms we may need to look at your back and get xrays.

## 2015-06-21 ENCOUNTER — Other Ambulatory Visit: Payer: Self-pay | Admitting: Internal Medicine

## 2015-06-21 ENCOUNTER — Ambulatory Visit (INDEPENDENT_AMBULATORY_CARE_PROVIDER_SITE_OTHER): Payer: Commercial Managed Care - HMO | Admitting: Physician Assistant

## 2015-06-21 VITALS — BP 122/80 | HR 81 | Temp 97.6°F | Resp 16 | Ht 66.0 in | Wt 118.6 lb

## 2015-06-21 DIAGNOSIS — Z8709 Personal history of other diseases of the respiratory system: Secondary | ICD-10-CM

## 2015-06-21 DIAGNOSIS — R05 Cough: Secondary | ICD-10-CM | POA: Diagnosis not present

## 2015-06-21 DIAGNOSIS — R059 Cough, unspecified: Secondary | ICD-10-CM

## 2015-06-21 MED ORDER — HYDROCODONE-HOMATROPINE 5-1.5 MG/5ML PO SYRP: 2.5000 mL | ORAL_SOLUTION | Freq: Every evening | ORAL | Status: DC | PRN

## 2015-06-21 MED ORDER — PREDNISONE 20 MG PO TABS: 40.0000 mg | ORAL_TABLET | Freq: Every day | ORAL | Status: DC

## 2015-06-21 MED ORDER — DOXYCYCLINE HYCLATE 100 MG PO CAPS: 100.0000 mg | ORAL_CAPSULE | Freq: Two times a day (BID) | ORAL | Status: AC

## 2015-06-21 NOTE — Progress Notes (Signed)
06/21/2015 9:39 PM   DOB: 07/24/1941 / MRN: [id number]  SUBJECTIVE:  Stacy [name] is a 74 y.o. female presenting for for the evaluation of non bloody cough that started 3 days ago.  Associated symptoms include runny nose and sore throat today and she denies congestion, fever, difficulty breathing, headache and jaw pain.The patient symptoms . Treatments tried thus far include nothing.She denies sick contacts.   CHL shows that she has a history of COPD, of which she was unaware.  Apparently this was diagnosed radiographically in 2014. This radiograph is linked to a pre op work up for what appears to be pacemake implantation.     She is allergic to codeine; latex; metronidazole; other; and penicillins.   She  has a past medical history of Chronic bipolar disorder ([place]); Coronary artery disease; Complete AV block ([place]); Takotsubo syndrome; GERD (gastroesophageal reflux disease); COPD (chronic obstructive pulmonary disease) ([place]); Abscess of liver(572.0); Atrial fibrillation ([place]); BIPOLAR AFFECTIVE DISORDER; DEPRESSION; DIABETES MELLITUS, TYPE II (dx 04/2010); DYSLIPIDEMIA; HYPERTENSION; HYPOTHYROIDISM; TOBACCO ABUSE; Pacemaker-Medtronic-dual-chamber; Parathyroid related hypercalcemia ([place]); Primary hyperparathyroidism ([place]); Vitamin D deficiency disease; and Cancer ([place]).    She  reports that she has been smoking Cigarettes.  She has a 24 pack-year smoking history. She has never used smokeless tobacco. She reports that she does not drink alcohol or use illicit drugs. She  reports that she does not engage in sexual activity. The patient  has past surgical history that includes Insert / replace / remove pacemaker; Parathyroidectomy; Abdominal hysterectomy; Cardiac catheterization; and Mass excision (Left, 01/27/2013).  Her family history includes CAD in her father and mother; Colon cancer in an other family member; Diabetes in her maternal aunt; Diabetes type II in her sister.  Review of Systems    Constitutional: Negative for fever and chills.  Respiratory: Negative for shortness of breath.   Cardiovascular: Negative for chest pain.  Gastrointestinal: Negative for nausea and abdominal pain.  Genitourinary: Negative.   Skin: Negative for rash.  Neurological: Negative for dizziness and headaches.    Problem list and medications reviewed and updated by myself where necessary, and exist elsewhere in the encounter.   OBJECTIVE:  BP 122/80 mmHg  Pulse 81  Temp(Src) 97.6 F (36.4 C) (Oral)  Resp 16  Ht 5\' 6"  (1.676 m)  Wt 118 lb 9.6 oz (53.797 kg)  BMI 19.15 kg/m2  SpO2 96%    Physical Exam  Constitutional: She is oriented to person, place, and time.  HENT:  Nose: No mucosal edema.  Mouth/Throat: Oropharynx is clear and moist.  Cardiovascular: Normal rate and normal heart sounds.   Pulmonary/Chest: Effort normal. No respiratory distress. She has no wheezes. She has no rales. She exhibits no tenderness.  Abdominal: Soft. There is no tenderness.  Musculoskeletal: Normal range of motion.  Neurological: She is alert and oriented to person, place, and time.  Skin: Skin is warm and dry.  Psychiatric: She has a normal mood and affect.  Nursing note and vitals reviewed.   No results found for this or any previous visit (from the past 48 hour(s)).  ASSESSMENT AND PLAN  Stacy [name] was seen today for cough and generalized body aches.  Diagnoses and all orders for this visit:  Cough: Given problem two and her lack of other symptoms will cover for COPD flare with doxy and low dose prednisone.  -     predniSONE (DELTASONE) 20 MG tablet; Take 2 tablets (40 mg total) by mouth daily with breakfast. -  doxycycline (VIBRAMYCIN) 100 MG capsule; Take 1 capsule (100 mg total) by mouth 2 (two) times daily. -     HYDROcodone-homatropine (HYCODAN) 5-1.5 MG/5ML syrup; Take 2.5-5 mLs by mouth at bedtime as needed for cough (Do not operate heavy machinery while taking this  medication.).  History of COPD: Patient seeking the care of a new PCP.  Advised that we are seeing new patients and would be happy to see her by appointment.  Will further work up this problem at that time.  -     predniSONE (DELTASONE) 20 MG tablet; Take 2 tablets (40 mg total) by mouth daily with breakfast. -     doxycycline (VIBRAMYCIN) 100 MG capsule; Take 1 capsule (100 mg total) by mouth 2 (two) times daily.    The patient was advised to call or return to clinic if she does not see an improvement in symptoms or to seek the care of the closest emergency department if she worsens with the above plan.   Philis Fendt, MHS, PA-C [facility] 06/21/2015 9:39 PM

## 2015-06-21 NOTE — Patient Instructions (Signed)
Chronic Obstructive Pulmonary Disease Chronic obstructive pulmonary disease (COPD) is a common lung condition in which airflow from the lungs is limited. COPD is a general term that can be used to describe many different lung problems that limit airflow, including both chronic bronchitis and emphysema. If you have COPD, your lung function will probably never return to normal, but there are measures you can take to improve lung function and make yourself feel better. CAUSES   Smoking (common).  Exposure to secondhand smoke.  Genetic problems.  Chronic inflammatory lung diseases or recurrent infections. SYMPTOMS  Shortness of breath, especially with physical activity.  Deep, persistent (chronic) cough with a large amount of thick mucus.  Wheezing.  Rapid breaths (tachypnea).  Gray or bluish discoloration (cyanosis) of the skin, especially in your fingers, toes, or lips.  Fatigue.  Weight loss.  Frequent infections or episodes when breathing symptoms become much worse (exacerbations).  Chest tightness. DIAGNOSIS Your health care provider will take a medical history and perform a physical examination to diagnose COPD. Additional tests for COPD may include:  Lung (pulmonary) function tests.  Chest X-ray.  CT scan.  Blood tests. TREATMENT  Treatment for COPD may include:  Inhaler and nebulizer medicines. These help manage the symptoms of COPD and make your breathing more comfortable.  Supplemental oxygen. Supplemental oxygen is only helpful if you have a low oxygen level in your blood.  Exercise and physical activity. These are beneficial for nearly all people with COPD.  Lung surgery or transplant.  Nutrition therapy to gain weight, if you are underweight.  Pulmonary rehabilitation. This may involve working with a team of health care providers and specialists, such as respiratory, occupational, and physical therapists. HOME CARE INSTRUCTIONS  Take all medicines  (inhaled or pills) as directed by your health care provider.  Avoid over-the-counter medicines or cough syrups that dry up your airway (such as antihistamines) and slow down the elimination of secretions unless instructed otherwise by your health care provider.  If you are a smoker, the most important thing that you can do is stop smoking. Continuing to smoke will cause further lung damage and breathing trouble. Ask your health care provider for help with quitting smoking. He or she can direct you to community resources or hospitals that provide support.  Avoid exposure to irritants such as smoke, chemicals, and fumes that aggravate your breathing.  Use oxygen therapy and pulmonary rehabilitation if directed by your health care provider. If you require home oxygen therapy, ask your health care provider whether you should purchase a pulse oximeter to measure your oxygen level at home.  Avoid contact with individuals who have a contagious illness.  Avoid extreme temperature and humidity changes.  Eat healthy foods. Eating smaller, more frequent meals and resting before meals may help you maintain your strength.  Stay active, but balance activity with periods of rest. Exercise and physical activity will help you maintain your ability to do things you want to do.  Preventing infection and hospitalization is very important when you have COPD. Make sure to receive all the vaccines your health care provider recommends, especially the pneumococcal and influenza vaccines. Ask your health care provider whether you need a pneumonia vaccine.  Learn and use relaxation techniques to manage stress.  Learn and use controlled breathing techniques as directed by your health care provider. Controlled breathing techniques include:  Pursed lip breathing. Start by breathing in (inhaling) through your nose for 1 second. Then, purse your lips as if you were   going to whistle and breathe out (exhale) through the  pursed lips for 2 seconds.  Diaphragmatic breathing. Start by putting one hand on your abdomen just above your waist. Inhale slowly through your nose. The hand on your abdomen should move out. Then purse your lips and exhale slowly. You should be able to feel the hand on your abdomen moving in as you exhale.  Learn and use controlled coughing to clear mucus from your lungs. Controlled coughing is a series of short, progressive coughs. The steps of controlled coughing are: 1. Lean your head slightly forward. 2. Breathe in deeply using diaphragmatic breathing. 3. Try to hold your breath for 3 seconds. 4. Keep your mouth slightly open while coughing twice. 5. Spit any mucus out into a tissue. 6. Rest and repeat the steps once or twice as needed. SEEK MEDICAL CARE IF:  You are coughing up more mucus than usual.  There is a change in the color or thickness of your mucus.  Your breathing is more labored than usual.  Your breathing is faster than usual. SEEK IMMEDIATE MEDICAL CARE IF:  You have shortness of breath while you are resting.  You have shortness of breath that prevents you from:  Being able to talk.  Performing your usual physical activities.  You have chest pain lasting longer than 5 minutes.  Your skin color is more cyanotic than usual.  You measure low oxygen saturations for longer than 5 minutes with a pulse oximeter. MAKE SURE YOU:  Understand these instructions.  Will watch your condition.  Will get help right away if you are not doing well or get worse.   This information is not intended to replace advice given to you by your health care provider. Make sure you discuss any questions you have with your health care provider.   Document Released: 05/03/2005 Document Revised: 08/14/2014 Document Reviewed: 03/20/2013 Elsevier Interactive Patient Education 2016 Elsevier Inc.  

## 2015-06-22 ENCOUNTER — Ambulatory Visit: Payer: Commercial Managed Care - HMO | Admitting: Internal Medicine

## 2015-06-22 ENCOUNTER — Encounter: Payer: Self-pay | Admitting: Cardiology

## 2015-06-22 LAB — CUP PACEART REMOTE DEVICE CHECK
Battery Voltage: 2.63 V
Brady Statistic AP VS Percent: 58 %
Brady Statistic AS VS Percent: 28 %
Implantable Lead Implant Date: 20010521
Implantable Lead Implant Date: 20010521
Implantable Lead Location: 753859
Implantable Lead Model: 4592
Lead Channel Impedance Value: 1372 Ohm
Lead Channel Impedance Value: 699 Ohm
Lead Channel Setting Pacing Amplitude: 2.5 V
Lead Channel Setting Pacing Pulse Width: 0.4 ms
MDC IDC LEAD LOCATION: 753860
MDC IDC LEAD MODEL: 6940
MDC IDC MSMT BATTERY IMPEDANCE: 5026 Ohm
MDC IDC MSMT BATTERY REMAINING LONGEVITY: 5 mo
MDC IDC SESS DTM: 20161103154836
MDC IDC SET LEADCHNL RA PACING AMPLITUDE: 2 V
MDC IDC SET LEADCHNL RV SENSING SENSITIVITY: 5.6 mV
MDC IDC STAT BRADY AP VP PERCENT: 14 %
MDC IDC STAT BRADY AS VP PERCENT: 0 %

## 2015-07-08 ENCOUNTER — Ambulatory Visit: Payer: Commercial Managed Care - HMO | Admitting: Family Medicine

## 2015-07-08 ENCOUNTER — Telehealth: Payer: Self-pay | Admitting: "Endocrinology

## 2015-07-12 ENCOUNTER — Ambulatory Visit: Payer: Commercial Managed Care - HMO | Admitting: *Deleted

## 2015-07-12 DIAGNOSIS — Z950 Presence of cardiac pacemaker: Secondary | ICD-10-CM

## 2015-07-12 NOTE — Telephone Encounter (Signed)
Paperwork filled out and waiting for Dr. Alfonse Spruce signature.

## 2015-07-12 NOTE — Progress Notes (Signed)
Remote pacemaker transmission.   

## 2015-07-15 ENCOUNTER — Ambulatory Visit (INDEPENDENT_AMBULATORY_CARE_PROVIDER_SITE_OTHER): Payer: Commercial Managed Care - HMO | Admitting: Emergency Medicine

## 2015-07-15 VITALS — BP 138/68 | HR 70 | Temp 98.2°F | Ht 65.0 in | Wt 118.0 lb

## 2015-07-15 DIAGNOSIS — S81812A Laceration without foreign body, left lower leg, initial encounter: Secondary | ICD-10-CM | POA: Diagnosis not present

## 2015-07-15 MED ORDER — CLINDAMYCIN HCL 150 MG PO CAPS: 150.0000 mg | ORAL_CAPSULE | Freq: Three times a day (TID) | ORAL | Status: DC

## 2015-07-15 MED ORDER — CIPROFLOXACIN HCL 500 MG PO TABS: 500.0000 mg | ORAL_TABLET | Freq: Two times a day (BID) | ORAL | Status: DC

## 2015-07-15 NOTE — Patient Instructions (Signed)

## 2015-07-15 NOTE — Progress Notes (Signed)
Subjective:  Patient ID: Stacy [name], female    DOB: [birth year]  Age: 74 y.o. MRN: [id number]  CC: Animal Bite and Ankle Pain   HPI  Stacy [name] presents  the patient has has a cat bite of the ankle. She said that the neighbors that on the cat of left town leaving the cat is feral APPARENTLY and outdoor cat anyway and is well known to her. The bite was unprovoked. She doesn't know if the cat has had any immunizations. Up-to-date on her tetanus immunization. Animal control is been notified that gets topics defined  History Zyeria has a past medical history of Chronic bipolar disorder ([place]); Coronary artery disease; Complete AV block ([place]); Takotsubo syndrome; GERD (gastroesophageal reflux disease); COPD (chronic obstructive pulmonary disease) ([place]); Abscess of liver(572.0); Atrial fibrillation ([place]); BIPOLAR AFFECTIVE DISORDER; DEPRESSION; DIABETES MELLITUS, TYPE II (dx 04/2010); DYSLIPIDEMIA; HYPERTENSION; HYPOTHYROIDISM; TOBACCO ABUSE; Pacemaker-Medtronic-dual-chamber; Parathyroid related hypercalcemia ([place]); Primary hyperparathyroidism ([place]); Vitamin D deficiency disease; and Cancer ([place]).   She has past surgical history that includes Insert / replace / remove pacemaker; Parathyroidectomy; Abdominal hysterectomy; Cardiac catheterization; and Mass excision (Left, 01/27/2013).   Her  family history includes CAD in her father and mother; Colon cancer in an other family member; Diabetes in her maternal aunt; Diabetes type II in her sister.  She   reports that she has been smoking Cigarettes.  She has a 24 pack-year smoking history. She has never used smokeless tobacco. She reports that she does not drink alcohol or use illicit drugs.  Outpatient Prescriptions Prior to Visit  Medication Sig Dispense Refill  . atorvastatin (LIPITOR) 80 MG tablet TAKE 1 TABLET EVERY DAY  AT  6:00  PM 90 tablet 3  . Calcium Carbonate (CALCIUM 500 PO) Take 1 tablet by mouth at bedtime.     . dabigatran  (PRADAXA) 150 MG CAPS capsule Take 1 capsule (150 mg total) by mouth 2 (two) times daily. 180 capsule 2  . donepezil (ARICEPT) 10 MG tablet Take 1 tablet (10 mg total) by mouth at bedtime. 90 tablet 1  . FLUoxetine (PROZAC) 10 MG capsule Take 10 mg by mouth daily.    Marland Kitchen lithium carbonate 300 MG capsule Take 300 mg by mouth daily.    Marland Kitchen losartan (COZAAR) 50 MG tablet Take 1 tablet (50 mg total) by mouth daily. 90 tablet 0  . Melatonin 5 MG CAPS Take 5 mg by mouth at bedtime.    . metFORMIN (GLUCOPHAGE-XR) 500 MG 24 hr tablet Take 1 tablet (500 mg total) by mouth daily with breakfast. 90 tablet 3  . Multiple Vitamin (MULTIVITAMIN) tablet Take 1 tablet by mouth daily.     Marland Kitchen omeprazole (PRILOSEC) 40 MG capsule TAKE 1 CAPSULE EVERY DAY  BEFORE  BREAKFAST 90 capsule 3  . Propylene Glycol (SYSTANE BALANCE OP) Apply 1 drop to eye at bedtime.    . solifenacin (VESICARE) 5 MG tablet Take 1 tablet (5 mg total) by mouth daily. 90 tablet 3  . SYNTHROID 112 MCG tablet Take 1 tablet (112 mcg total) by mouth daily. Take one 112 mcg brand Synthroid tablet daily. 90 tablet 3  . desoximetasone (TOPICORT) 0.05 % cream Apply 1 application topically 2 (two) times daily as needed (itching).    Marland Kitchen HYDROcodone-homatropine (HYCODAN) 5-1.5 MG/5ML syrup Take 2.5-5 mLs by mouth at bedtime as needed for cough (Do not operate heavy machinery while taking this medication.). (Patient not taking: Reported on 07/15/2015) 60 mL 0  . loratadine (CLARITIN) 10 MG  tablet Take 1 tablet (10 mg total) by mouth daily. (Patient not taking: Reported on 07/15/2015) 90 tablet 3  . predniSONE (DELTASONE) 20 MG tablet Take 2 tablets (40 mg total) by mouth daily with breakfast. (Patient not taking: Reported on 07/15/2015) 10 tablet 0   No facility-administered medications prior to visit.    Social History   Social History  . Marital Status: Single    Spouse Name: N/A  . Number of Children: 2  . Years of Education: N/A   Occupational History  .      Social History Main Topics  . Smoking status: Current Every Day Smoker -- 0.50 packs/day for 48 years    Types: Cigarettes  . Smokeless tobacco: Never Used     Comment: switched to vapor cigs. Widowed- 2 girls. Lives with daughter (who is nurse at [employer])  . Alcohol Use: No     Comment: "once in a blue moon" when I have [place] food  . Drug Use: No  . Sexual Activity: No   Other Topics Concern  . None   Social History Narrative   WIDOWED   2 DAUGHTERS   LIVES W/DAUGHTER   CURRENTLY SMOKES   NO ALCOHOL USE   NO ILLICIT DRUG USE   DAILY CAFFEINE USE      PPM-[employer]   PATIENT SIGNED A DESIGNATED PARTY RELEASE TO ALLOW DAUGHTER, NICOLE COX, TO HAVE ACCESS TO HER MEDICAL RECORDS/INFORMATION. Fleet Contras, November 09, 2009 9:19 AM     Review of Systems  Constitutional: Negative for fever, chills and appetite change.  HENT: Negative for congestion, ear pain, postnasal drip, sinus pressure and sore throat.   Eyes: Negative for pain and redness.  Respiratory: Negative for cough, shortness of breath and wheezing.   Cardiovascular: Negative for leg swelling.  Gastrointestinal: Negative for nausea, vomiting, abdominal pain, diarrhea, constipation and blood in stool.  Endocrine: Negative for polyuria.  Genitourinary: Negative for dysuria, urgency, frequency and flank pain.  Musculoskeletal: Negative for gait problem.  Skin: Negative for rash.  Neurological: Negative for weakness and headaches.  Psychiatric/Behavioral: Negative for confusion and decreased concentration. The patient is not nervous/anxious.     Objective:  BP 138/68 mmHg  Pulse 70  Temp(Src) 98.2 F (36.8 C) (Oral)  Ht 5\' 5"  (1.651 m)  Wt 118 lb (53.524 kg)  BMI 19.64 kg/m2  SpO2 97%  Physical Exam  Constitutional: She is oriented to person, place, and time. She appears well-developed and well-nourished. No distress.  HENT:  Head: Normocephalic and atraumatic.  Right Ear: External ear normal.  Left  Ear: External ear normal.  Nose: Nose normal.  Eyes: Conjunctivae and EOM are normal. Pupils are equal, round, and reactive to light. No scleral icterus.  Neck: Normal range of motion. Neck supple. No tracheal deviation present.  Cardiovascular: Normal rate, regular rhythm and normal heart sounds.   Pulmonary/Chest: Effort normal. No respiratory distress. She has no wheezes. She has no rales.  Abdominal: She exhibits no mass. There is no tenderness. There is no rebound and no guarding.  Musculoskeletal: She exhibits no edema.  Lymphadenopathy:    She has no cervical adenopathy.  Neurological: She is alert and oriented to person, place, and time. Coordination normal.  Skin: Skin is warm and dry. Laceration noted. No rash noted.  Psychiatric: She has a normal mood and affect. Her behavior is normal.   She has multiple superficial lacerations of her ankle. Is no visible foreign body neurovascular injury.  Assessment & Plan:   Shantell was seen today for animal bite and ankle pain.  Diagnoses and all orders for this visit:  Leg laceration, left, initial encounter  Other orders -     ciprofloxacin (CIPRO) 500 MG tablet; Take 1 tablet (500 mg total) by mouth 2 (two) times daily. -     clindamycin (CLEOCIN) 150 MG capsule; Take 1 capsule (150 mg total) by mouth 3 (three) times daily.   I am having Ms. Pascual start on ciprofloxacin and clindamycin. I am also having her maintain her Calcium Carbonate (CALCIUM 500 PO), multivitamin, Propylene Glycol (SYSTANE BALANCE OP), desoximetasone, lithium carbonate, FLUoxetine, losartan, atorvastatin, loratadine, solifenacin, metFORMIN, donepezil, Melatonin, omeprazole, SYNTHROID, dabigatran, predniSONE, and HYDROcodone-homatropine.  Meds ordered this encounter  Medications  . ciprofloxacin (CIPRO) 500 MG tablet    Sig: Take 1 tablet (500 mg total) by mouth 2 (two) times daily.    Dispense:  20 tablet    Refill:  0  . clindamycin (CLEOCIN) 150 MG  capsule    Sig: Take 1 capsule (150 mg total) by mouth 3 (three) times daily.    Dispense:  30 capsule    Refill:  0   I discussed with her the need to monitor the cat as there is a substantial risk of rabies in a feral cat. She assured me that she would watch to ensure that the cat was captured by the animal control and if it's not located in today she needs to start rabies vaccination  Appropriate red flag conditions were discussed with the patient as well as actions that should be taken.  Patient expressed his understanding.  Follow-up: Return if symptoms worsen or fail to improve.  Roselee Culver, MD

## 2015-07-19 NOTE — Progress Notes (Addendum)
Cardiology Office Note Date:  07/20/2015  Patient ID:  Stacy [name], Stacy [name] 10/08/1940, MRN [id number] PCP:  No PCP Per Patient  Cardiologist/Electrophysiologist: Dr. Caryl Comes   Chief Complaint: patient was called in regarding her pacer battery  History of Present Illness: Stacy [name] is a 74 y.o. female with history of CAD with PCI in 2010, PPM secondary to CHB, Takotsubo syndrome, HTN, hyperparathyoridism, hypothyoridism (Hashimoto's) COPD, PAFib on Pradaxa comes today feeling well.  She noticed lately in t he last couple weeks feeling like she can feel her heart beat, not fast or irregular, just aware of it, (likely secondary to VVI with battery at ERI)  No CP, SOB, no dizziness, near syncope or syncope.  She was recently bitten by a stray cat, currently taking antibiotics, denies fever, chills, denies pain/swelling at the site.   Past Medical History  Diagnosis Date  . Chronic bipolar disorder ([place])   . Coronary artery disease     s/p PTCA  . Complete AV block ([place])     s/p PPM--MEDTRONIC ADAPTA ADDr01  . Takotsubo syndrome   . GERD (gastroesophageal reflux disease)   . COPD (chronic obstructive pulmonary disease) (HCC)     TOBACCO ABUSE  . Abscess of liver(572.0)   . Atrial fibrillation (H[facility])     chronic anticoag - pradaxa  . BIPOLAR AFFECTIVE DISORDER   . DEPRESSION   . DIABETES MELLITUS, TYPE II dx 04/2010  . DYSLIPIDEMIA   . HYPERTENSION   . HYPOTHYROIDISM     hashimoto's  . TOBACCO ABUSE   . Pacemaker-Medtronic-dual-chamber   . Parathyroid related hypercalcemia ([place])   . Primary hyperparathyroidism ([place])   . Vitamin D deficiency disease   . Cancer ([place])     basal cell on abdomen    Past Surgical History  Procedure Laterality Date  . Insert / replace / remove pacemaker      MEDTRONIC ADAPTA ADDr01  . Parathyroidectomy      RIGHT INFERIOR  . Abdominal hysterectomy    . Cardiac catheterization    . Mass excision Left 01/27/2013    Procedure: EXCISION  MASS LEFT FLANK;  Surgeon: Earnstine Regal, MD;  Location: [facility];  Service: General;  Laterality: Left;    Current Outpatient Prescriptions  Medication Sig Dispense Refill  . atorvastatin (LIPITOR) 80 MG tablet TAKE 1 TABLET EVERY DAY  AT  6:00  PM 90 tablet 3  . Calcium Carbonate (CALCIUM 500 PO) Take 1 tablet by mouth at bedtime.     . ciprofloxacin (CIPRO) 500 MG tablet Take 1 tablet (500 mg total) by mouth 2 (two) times daily. 20 tablet 0  . clindamycin (CLEOCIN) 150 MG capsule Take 1 capsule (150 mg total) by mouth 3 (three) times daily. 30 capsule 0  . dabigatran (PRADAXA) 150 MG CAPS capsule Take 1 capsule (150 mg total) by mouth 2 (two) times daily. 180 capsule 2  . desoximetasone (TOPICORT) 0.05 % cream Apply 1 application topically 2 (two) times daily as needed (itching).    Marland Kitchen donepezil (ARICEPT) 10 MG tablet Take 1 tablet (10 mg total) by mouth at bedtime. 90 tablet 1  . FLUoxetine (PROZAC) 10 MG capsule Take 10 mg by mouth daily.    Marland Kitchen HYDROcodone-homatropine (HYCODAN) 5-1.5 MG/5ML syrup Take 2.5-5 mLs by mouth at bedtime as needed for cough (Do not operate heavy machinery while taking this medication.). (Patient not taking: Reported on 07/15/2015) 60 mL 0  . lithium carbonate 300 MG capsule Take 300 mg by  mouth daily.    Marland Kitchen loratadine (CLARITIN) 10 MG tablet Take 1 tablet (10 mg total) by mouth daily. (Patient not taking: Reported on 07/15/2015) 90 tablet 3  . losartan (COZAAR) 50 MG tablet Take 1 tablet (50 mg total) by mouth daily. 90 tablet 0  . Melatonin 5 MG CAPS Take 5 mg by mouth at bedtime.    . metFORMIN (GLUCOPHAGE-XR) 500 MG 24 hr tablet Take 1 tablet (500 mg total) by mouth daily with breakfast. 90 tablet 3  . Multiple Vitamin (MULTIVITAMIN) tablet Take 1 tablet by mouth daily.     Marland Kitchen omeprazole (PRILOSEC) 40 MG capsule TAKE 1 CAPSULE EVERY DAY  BEFORE  BREAKFAST 90 capsule 3  . predniSONE (DELTASONE) 20 MG tablet Take 2 tablets (40 mg total) by mouth daily with breakfast.  (Patient not taking: Reported on 07/15/2015) 10 tablet 0  . Propylene Glycol (SYSTANE BALANCE OP) Apply 1 drop to eye at bedtime.    . solifenacin (VESICARE) 5 MG tablet Take 1 tablet (5 mg total) by mouth daily. 90 tablet 3  . SYNTHROID 112 MCG tablet Take 1 tablet (112 mcg total) by mouth daily. Take one 112 mcg brand Synthroid tablet daily. 90 tablet 3   No current facility-administered medications for this visit.    Allergies:   Codeine; Latex; Metronidazole; Other; and Penicillins   Social History:  The patient  reports that she has been smoking Cigarettes.  She has a 24 pack-year smoking history. She has never used smokeless tobacco. She reports that she does not drink alcohol or use illicit drugs.   Family History:  The patient's family history includes CAD in her father and mother; Colon cancer in an other family member; Diabetes in her maternal aunt; Diabetes type II in her sister.  ROS:  Please see the history of present illness.   All other systems are reviewed and otherwise negative.   PHYSICAL EXAM:  VS:  BP 124/76 mmHg  Pulse 67  Ht 5\' 5"  (1.651 m)  Wt 122 lb (55.339 kg)  BMI 20.30 kg/m2 BMI: Body mass index is 20.3 kg/(m^2). Well nourished, but thin, WF in no acute distress HEENT: normocephalic, atraumatic Neck: no JVD, carotid bruits or masses Cardiac:  normal S1, S2; RRR; no significant murmurs, no rubs, or gallops Lungs:  clear to auscultation bilaterally, no wheezing, rhonchi or rales Abd: soft, nontender,  + BS MS: no deformity or atrophy Ext: no edema Skin: warm and dry, no rash.  She has healing wounds to left ankle, dry, no erythema or edema, no pain. Neuro:  No gross deficits appreciated Psych: euthymic mood, full affect  PPM site is stable, no tethering or discomfort   EKG:  Done today shows SR, V paced PPM check today noting battery triggered ERI 07/02/15 and is VVI mode  09/12/07: Echocardiogram SUMMARY - Overall left ventricular systolic function  was normal. Left    ventricular ejection fraction was estimated , range being 55    % to 60 %. There was no diagnostic evidence of left    ventricular regional wall motion abnormalities. Left    ventricular wall thickness was at the upper limits of normal. - Aortic valve thickness was mildly increased. - The possibility of a patent foramen ovale cannot be excluded on    the basis of this study. - There was the appearance of a catheter or pacing wire in the    right ventricle. - The estimated peak pulmonary artery systolic pressure was mild to  moderately increased. - The inferior vena cava was dilated.   Recent Labs: 06/01/2015: TSH 1.632  06/01/2015: Cholesterol 99*; HDL 63; LDL Cholesterol 13; Total CHOL/HDL Ratio 1.6; Triglycerides 116; VLDL 23   CrCl cannot be calculated (Patient has no serum creatinine result on file.).   Wt Readings from Last 3 Encounters:  07/20/15 122 lb (55.339 kg)  07/15/15 118 lb (53.524 kg)  06/21/15 118 lb 9.6 oz (53.797 kg)     Other studies reviewed: Additional studies/records reviewed today include: summarized above  DEVICE: MDT Adapta generator change 2007, original implant 2001  ASSESSMENT AND PLAN:  1.   PPM, MDT device       Q 54month Carelink checks       Battery is ERI, we will schedule a generator change, after she completes her antibiotic therapy for the cat bite and this is healed and treated completely.   2.  PAF on Pradaxa       CHADS2Vasc is 4       No reports of bleeding or signs of bleeding       Needs labs  3.    HTN        Well controlled  4.    CAD        No CP, the patient does not recall prior PTCA         Lipids look great, following with her endo pending a new PMD.  5.    Longstanding tobacco        We discussed at length, she is motivated to quit, will keep trying   Disposition: We will schedule her for a generator change, pre-op labs, BMET and CBC, arrange wound check  post-procedure and then f/u visit. For 39mo  Current medicines are reviewed at length with the patient today.  The patient did not have any concerns regarding medicines.  Addended to include CHADS2Vasc score.  Haywood Lasso, PA-C 07/20/2015 10:23 AM     [place] [address] [place] [address] [phone] (office)  [phone] (fax)

## 2015-07-20 ENCOUNTER — Encounter: Payer: Self-pay | Admitting: *Deleted

## 2015-07-20 ENCOUNTER — Other Ambulatory Visit: Payer: Self-pay | Admitting: Physician Assistant

## 2015-07-20 ENCOUNTER — Ambulatory Visit (INDEPENDENT_AMBULATORY_CARE_PROVIDER_SITE_OTHER): Payer: Commercial Managed Care - HMO | Admitting: Physician Assistant

## 2015-07-20 VITALS — BP 124/76 | HR 67 | Ht 65.0 in | Wt 122.0 lb

## 2015-07-20 DIAGNOSIS — I10 Essential (primary) hypertension: Secondary | ICD-10-CM | POA: Diagnosis not present

## 2015-07-20 DIAGNOSIS — I480 Paroxysmal atrial fibrillation: Secondary | ICD-10-CM | POA: Diagnosis not present

## 2015-07-20 DIAGNOSIS — I442 Atrioventricular block, complete: Secondary | ICD-10-CM | POA: Diagnosis not present

## 2015-07-20 LAB — CUP PACEART INCLINIC DEVICE CHECK
Battery Voltage: 2.62 V
Implantable Lead Implant Date: 20010521
Implantable Lead Location: 753860
Implantable Lead Model: 4592
Lead Channel Sensing Intrinsic Amplitude: 11.2 mV
Lead Channel Setting Pacing Amplitude: 3.5 V
MDC IDC LEAD IMPLANT DT: 20010521
MDC IDC LEAD LOCATION: 753859
MDC IDC LEAD MODEL: 6940
MDC IDC MSMT BATTERY IMPEDANCE: 6053 Ohm
MDC IDC MSMT LEADCHNL RA IMPEDANCE VALUE: 67 Ohm
MDC IDC MSMT LEADCHNL RV IMPEDANCE VALUE: 1087 Ohm
MDC IDC MSMT LEADCHNL RV PACING THRESHOLD AMPLITUDE: 1 V
MDC IDC MSMT LEADCHNL RV PACING THRESHOLD PULSEWIDTH: 0.4 ms
MDC IDC SESS DTM: 20161213102445
MDC IDC SET LEADCHNL RV PACING PULSEWIDTH: 0.4 ms
MDC IDC SET LEADCHNL RV SENSING SENSITIVITY: 4 mV
MDC IDC STAT BRADY RV PERCENT PACED: 16 %

## 2015-07-20 NOTE — Patient Instructions (Addendum)
Medication Instructions:   CONTINUE SAME MEDICATIONS    If you need a refill on your cardiac medications before your next appointment, please call your pharmacy.  Labwork: RETURN CBC AND BMET 07/29/15   Testing/Procedures: SEE LETTER FOR PPM BATTERY CHANGE OUT    Follow-Up:  10  DAYS AFTER 08/05/15.Marland KitchenMarland KitchenWEEK OF 08/16/15   91 DAYS WITH DR Caryl Comes  AFTER 08/05/15 ... 11/05/15    Any Other Special Instructions Will Be Listed Below (If Applicable).

## 2015-07-21 LAB — CUP PACEART REMOTE DEVICE CHECK
Battery Voltage: 2.63 V
Date Time Interrogation Session: 20161205144439
Implantable Lead Implant Date: 20010521
Lead Channel Impedance Value: 67 Ohm
Lead Channel Setting Pacing Pulse Width: 0.4 ms
Lead Channel Setting Sensing Sensitivity: 4 mV
MDC IDC LEAD IMPLANT DT: 20010521
MDC IDC LEAD LOCATION: 753859
MDC IDC LEAD LOCATION: 753860
MDC IDC LEAD MODEL: 4592
MDC IDC LEAD MODEL: 6940
MDC IDC MSMT BATTERY IMPEDANCE: 6001 Ohm
MDC IDC MSMT BATTERY REMAINING LONGEVITY: 5 mo
MDC IDC MSMT LEADCHNL RV IMPEDANCE VALUE: 1217 Ohm
MDC IDC SET LEADCHNL RV PACING AMPLITUDE: 3.5 V
MDC IDC STAT BRADY RV PERCENT PACED: 16 %

## 2015-07-28 ENCOUNTER — Encounter: Payer: Self-pay | Admitting: *Deleted

## 2015-07-29 ENCOUNTER — Other Ambulatory Visit (INDEPENDENT_AMBULATORY_CARE_PROVIDER_SITE_OTHER): Payer: Commercial Managed Care - HMO | Admitting: *Deleted

## 2015-07-29 DIAGNOSIS — I442 Atrioventricular block, complete: Secondary | ICD-10-CM | POA: Diagnosis not present

## 2015-07-29 LAB — BASIC METABOLIC PANEL
BUN: 15 mg/dL (ref 7–25)
CALCIUM: 9.7 mg/dL (ref 8.6–10.4)
CHLORIDE: 103 mmol/L (ref 98–110)
CO2: 27 mmol/L (ref 20–31)
Creat: 1.02 mg/dL — ABNORMAL HIGH (ref 0.60–0.93)
Glucose, Bld: 173 mg/dL — ABNORMAL HIGH (ref 65–99)
POTASSIUM: 3.6 mmol/L (ref 3.5–5.3)
SODIUM: 138 mmol/L (ref 135–146)

## 2015-07-29 LAB — CBC
HEMATOCRIT: 43.3 % (ref 36.0–46.0)
HEMOGLOBIN: 14.3 g/dL (ref 12.0–15.0)
MCH: 30.7 pg (ref 26.0–34.0)
MCHC: 33 g/dL (ref 30.0–36.0)
MCV: 92.9 fL (ref 78.0–100.0)
MPV: 9.9 fL (ref 8.6–12.4)
Platelets: 210 10*3/uL (ref 150–400)
RBC: 4.66 MIL/uL (ref 3.87–5.11)
RDW: 13.3 % (ref 11.5–15.5)
WBC: 8.7 10*3/uL (ref 4.0–10.5)

## 2015-07-29 NOTE — Addendum Note (Signed)
Addended by: Eulis Foster on: 07/29/2015 08:30 AM   Modules accepted: Orders

## 2015-08-03 ENCOUNTER — Telehealth: Payer: Self-pay | Admitting: *Deleted

## 2015-08-03 NOTE — Telephone Encounter (Signed)
-----   Message from [facility], [place] sent at 07/30/2015  3:38 PM EST ----- Please let her know her labs look good for her planned battery change next week.  Please confirm with her she has completed her antibiotics and that her wound from her cat bite is healed and doing well.  She should not take her pradaxa for the 2 days prior to the procedure.  Thanks Tommye Standard, PA-C

## 2015-08-03 NOTE — Telephone Encounter (Signed)
New Message  Pt Called states that she was advised to stop taking the pradxa. However she is requesting a call back to determine what to do about the other medications she is taking please call back to discuss.

## 2015-08-03 NOTE — Telephone Encounter (Signed)
-----   Message from [facility], [place] sent at 07/30/2015  3:38 PM EST ----- Please let her know her labs look good for her planned battery change next week.  Please confirm with her she has completed her antibiotics and that her wound from her cat bite is healed and doing well.  She should not take her pradaxa for the 2 days prior to the procedure.  Thanks Tommye Standard, PA-C

## 2015-08-03 NOTE — Telephone Encounter (Signed)
I think this message was sent to me by mistake.

## 2015-08-04 ENCOUNTER — Telehealth: Payer: Self-pay | Admitting: *Deleted

## 2015-08-04 DIAGNOSIS — F3181 Bipolar II disorder: Secondary | ICD-10-CM | POA: Diagnosis not present

## 2015-08-05 ENCOUNTER — Encounter (HOSPITAL_COMMUNITY)
Admission: RE | Disposition: A | Discharge status: Home / Self Care | Payer: Commercial Managed Care - HMO | Source: Ambulatory Visit | Attending: Internal Medicine

## 2015-08-05 ENCOUNTER — Ambulatory Visit (HOSPITAL_COMMUNITY)
Admission: RE | Admit: 2015-08-05 | Discharge: 2015-08-05 | Disposition: A | Discharge status: Home / Self Care | Payer: Commercial Managed Care - HMO | Source: Ambulatory Visit | Attending: Internal Medicine | Admitting: Internal Medicine

## 2015-08-05 DIAGNOSIS — F319 Bipolar disorder, unspecified: Secondary | ICD-10-CM | POA: Diagnosis not present

## 2015-08-05 DIAGNOSIS — I480 Paroxysmal atrial fibrillation: Secondary | ICD-10-CM | POA: Diagnosis not present

## 2015-08-05 DIAGNOSIS — Z8249 Family history of ischemic heart disease and other diseases of the circulatory system: Secondary | ICD-10-CM | POA: Insufficient documentation

## 2015-08-05 DIAGNOSIS — Z7901 Long term (current) use of anticoagulants: Secondary | ICD-10-CM | POA: Diagnosis not present

## 2015-08-05 DIAGNOSIS — Z91040 Latex allergy status: Secondary | ICD-10-CM | POA: Diagnosis not present

## 2015-08-05 DIAGNOSIS — I5181 Takotsubo syndrome: Secondary | ICD-10-CM | POA: Diagnosis not present

## 2015-08-05 DIAGNOSIS — I10 Essential (primary) hypertension: Secondary | ICD-10-CM | POA: Insufficient documentation

## 2015-08-05 DIAGNOSIS — E039 Hypothyroidism, unspecified: Secondary | ICD-10-CM | POA: Diagnosis not present

## 2015-08-05 DIAGNOSIS — I2510 Atherosclerotic heart disease of native coronary artery without angina pectoris: Secondary | ICD-10-CM | POA: Diagnosis not present

## 2015-08-05 DIAGNOSIS — K219 Gastro-esophageal reflux disease without esophagitis: Secondary | ICD-10-CM | POA: Insufficient documentation

## 2015-08-05 DIAGNOSIS — Z45010 Encounter for checking and testing of cardiac pacemaker pulse generator [battery]: Secondary | ICD-10-CM | POA: Insufficient documentation

## 2015-08-05 DIAGNOSIS — E785 Hyperlipidemia, unspecified: Secondary | ICD-10-CM | POA: Insufficient documentation

## 2015-08-05 DIAGNOSIS — E559 Vitamin D deficiency, unspecified: Secondary | ICD-10-CM | POA: Diagnosis not present

## 2015-08-05 DIAGNOSIS — F17210 Nicotine dependence, cigarettes, uncomplicated: Secondary | ICD-10-CM | POA: Diagnosis not present

## 2015-08-05 DIAGNOSIS — Z950 Presence of cardiac pacemaker: Secondary | ICD-10-CM | POA: Diagnosis present

## 2015-08-05 DIAGNOSIS — E119 Type 2 diabetes mellitus without complications: Secondary | ICD-10-CM | POA: Diagnosis not present

## 2015-08-05 DIAGNOSIS — Z880 Allergy status to penicillin: Secondary | ICD-10-CM | POA: Diagnosis not present

## 2015-08-05 DIAGNOSIS — Z7984 Long term (current) use of oral hypoglycemic drugs: Secondary | ICD-10-CM | POA: Diagnosis not present

## 2015-08-05 DIAGNOSIS — E210 Primary hyperparathyroidism: Secondary | ICD-10-CM | POA: Insufficient documentation

## 2015-08-05 DIAGNOSIS — J449 Chronic obstructive pulmonary disease, unspecified: Secondary | ICD-10-CM | POA: Diagnosis not present

## 2015-08-05 DIAGNOSIS — I442 Atrioventricular block, complete: Secondary | ICD-10-CM | POA: Insufficient documentation

## 2015-08-05 HISTORY — PX: EP IMPLANTABLE DEVICE: SHX172B

## 2015-08-05 LAB — GLUCOSE, CAPILLARY: Glucose-Capillary: 111 mg/dL — ABNORMAL HIGH (ref 65–99)

## 2015-08-05 LAB — SURGICAL PCR SCREEN
MRSA, PCR: NEGATIVE
STAPHYLOCOCCUS AUREUS: NEGATIVE

## 2015-08-05 SURGERY — PPM/BIV PPM GENERATOR CHANGEOUT
Anesthesia: LOCAL

## 2015-08-05 MED ORDER — ACETAMINOPHEN 325 MG PO TABS: 325.0000 mg | ORAL_TABLET | ORAL | Status: DC | PRN

## 2015-08-05 MED ORDER — CHLORHEXIDINE GLUCONATE 4 % EX LIQD
60.0000 mL | Freq: Once | CUTANEOUS | Status: DC
  Filled 2015-08-05: qty 60

## 2015-08-05 MED ORDER — ONDANSETRON HCL 4 MG/2ML IJ SOLN
INTRAMUSCULAR | Status: AC
  Filled 2015-08-05: qty 2

## 2015-08-05 MED ORDER — SODIUM CHLORIDE 0.9 % IR SOLN
80.0000 mg | Status: DC
  Filled 2015-08-05: qty 2

## 2015-08-05 MED ORDER — LIDOCAINE HCL (PF) 1 % IJ SOLN
INTRAMUSCULAR | Status: AC
  Filled 2015-08-05: qty 60

## 2015-08-05 MED ORDER — SODIUM CHLORIDE 0.9 % IV SOLN
INTRAVENOUS | Status: DC
  Administered 2015-08-05: 08:00:00 via INTRAVENOUS

## 2015-08-05 MED ORDER — OXYCODONE-ACETAMINOPHEN 5-325 MG PO TABS: 1.0000 | ORAL_TABLET | Freq: Four times a day (QID) | ORAL | Status: DC | PRN

## 2015-08-05 MED ORDER — MIDAZOLAM HCL 5 MG/5ML IJ SOLN
INTRAMUSCULAR | Status: AC
  Filled 2015-08-05: qty 5

## 2015-08-05 MED ORDER — DIAZEPAM 5 MG PO TABS
ORAL_TABLET | ORAL | Status: AC
  Administered 2015-08-05: 5 mg via ORAL
  Filled 2015-08-05: qty 1

## 2015-08-05 MED ORDER — ONDANSETRON HCL 4 MG/2ML IJ SOLN
4.0000 mg | Freq: Four times a day (QID) | INTRAMUSCULAR | Status: DC | PRN
  Administered 2015-08-05: 4 mg via INTRAVENOUS

## 2015-08-05 MED ORDER — SODIUM CHLORIDE 0.9 % IR SOLN
Status: AC
  Filled 2015-08-05: qty 2

## 2015-08-05 MED ORDER — DIAZEPAM 5 MG PO TABS
5.0000 mg | ORAL_TABLET | Freq: Once | ORAL | Status: AC
  Administered 2015-08-05: 5 mg via ORAL
  Filled 2015-08-05: qty 1

## 2015-08-05 MED ORDER — SODIUM CHLORIDE 0.9 % IV SOLN: INTRAVENOUS | Status: AC

## 2015-08-05 MED ORDER — VANCOMYCIN HCL IN DEXTROSE 1-5 GM/200ML-% IV SOLN
1000.0000 mg | INTRAVENOUS | Status: AC
  Administered 2015-08-05: 1000 mg via INTRAVENOUS
  Filled 2015-08-05 (×2): qty 200

## 2015-08-05 MED ORDER — MUPIROCIN 2 % EX OINT
1.0000 "application " | TOPICAL_OINTMENT | Freq: Once | CUTANEOUS | Status: AC
  Administered 2015-08-05: 1 via TOPICAL
  Filled 2015-08-05: qty 22

## 2015-08-05 MED ORDER — MIDAZOLAM HCL 5 MG/5ML IJ SOLN
INTRAMUSCULAR | Status: DC | PRN
  Administered 2015-08-05: 2 mg via INTRAVENOUS
  Administered 2015-08-05: 1 mg via INTRAVENOUS

## 2015-08-05 MED ORDER — FENTANYL CITRATE (PF) 100 MCG/2ML IJ SOLN
INTRAMUSCULAR | Status: DC | PRN
  Administered 2015-08-05: 25 ug via INTRAVENOUS
  Administered 2015-08-05: 50 ug via INTRAVENOUS

## 2015-08-05 MED ORDER — FENTANYL CITRATE (PF) 100 MCG/2ML IJ SOLN
INTRAMUSCULAR | Status: AC
  Filled 2015-08-05: qty 2

## 2015-08-05 MED ORDER — MUPIROCIN 2 % EX OINT
TOPICAL_OINTMENT | CUTANEOUS | Status: AC
  Filled 2015-08-05: qty 22

## 2015-08-05 SURGICAL SUPPLY — 7 items
CABLE SURGICAL S-101-97-12 (CABLE) ×2 IMPLANT
KIT ESSENTIALS PG (KITS) ×2 IMPLANT
PACEMAKER ADAPTA DR ADDRL1 (Pacemaker) ×1 IMPLANT
PAD DEFIB LIFELINK (PAD) ×2 IMPLANT
POUCH AIGIS-R ANTIBACT ICD (Mesh General) ×2 IMPLANT
PPM ADAPTA DR ADDRL1 (Pacemaker) ×2 IMPLANT
TRAY PACEMAKER INSERTION (PACKS) ×2 IMPLANT

## 2015-08-05 NOTE — Progress Notes (Signed)
Pt has been nauseated off and on since returned from cath lab.  Pt given zofran 4mg  IV. Dr. Caryl Comes notified

## 2015-08-05 NOTE — Telephone Encounter (Signed)
New message    Pt wants pain medication

## 2015-08-05 NOTE — Discharge Instructions (Signed)

## 2015-08-05 NOTE — Telephone Encounter (Signed)
Per Dr. Caryl Comes- she can have Tylenol # 3 or Percocet 5/325 mg. The patient is aware of this and states she is ok with either one. In filling her RX- noted she does have a codeine allergy- will fill Percocet 5/325 mg one tablet by mouth every 6 hours as needed for pain #10 w/ no refills.

## 2015-08-05 NOTE — H&P (View-Only) (Signed)
Cardiology Office Note Date:  07/20/2015  Patient ID:  Stacy [name], Stacy [name] [birth year], MRN [id number] PCP:  No PCP Per Patient  Cardiologist/Electrophysiologist: Dr. Caryl Comes   Chief Complaint: patient was called in regarding her pacer battery  History of Present Illness: Stacy [name] is a 74 y.o. female with history of CAD with PCI in 2010, PPM secondary to CHB, Takotsubo syndrome, HTN, hyperparathyoridism, hypothyoridism (Hashimoto's) COPD, PAFib on Pradaxa comes today feeling well.  She noticed lately in t he last couple weeks feeling like she can feel her heart beat, not fast or irregular, just aware of it, (likely secondary to VVI with battery at ERI)  No CP, SOB, no dizziness, near syncope or syncope.  She was recently bitten by a stray cat, currently taking antibiotics, denies fever, chills, denies pain/swelling at the site.   Past Medical History  Diagnosis Date  . Chronic bipolar disorder ([place])   . Coronary artery disease     s/p PTCA  . Complete AV block ([place])     s/p PPM--MEDTRONIC ADAPTA ADDr01  . Takotsubo syndrome   . GERD (gastroesophageal reflux disease)   . COPD (chronic obstructive pulmonary disease) (HCC)     TOBACCO ABUSE  . Abscess of liver(572.0)   . Atrial fibrillation (H[facility])     chronic anticoag - pradaxa  . BIPOLAR AFFECTIVE DISORDER   . DEPRESSION   . DIABETES MELLITUS, TYPE II dx 04/2010  . DYSLIPIDEMIA   . HYPERTENSION   . HYPOTHYROIDISM     hashimoto's  . TOBACCO ABUSE   . Pacemaker-Medtronic-dual-chamber   . Parathyroid related hypercalcemia ([place])   . Primary hyperparathyroidism ([place])   . Vitamin D deficiency disease   . Cancer ([place])     basal cell on abdomen    Past Surgical History  Procedure Laterality Date  . Insert / replace / remove pacemaker      MEDTRONIC ADAPTA ADDr01  . Parathyroidectomy      RIGHT INFERIOR  . Abdominal hysterectomy    . Cardiac catheterization    . Mass excision Left 01/27/2013    Procedure: EXCISION  MASS LEFT FLANK;  Surgeon: Earnstine Regal, MD;  Location: [facility];  Service: General;  Laterality: Left;    Current Outpatient Prescriptions  Medication Sig Dispense Refill  . atorvastatin (LIPITOR) 80 MG tablet TAKE 1 TABLET EVERY DAY  AT  6:00  PM 90 tablet 3  . Calcium Carbonate (CALCIUM 500 PO) Take 1 tablet by mouth at bedtime.     . ciprofloxacin (CIPRO) 500 MG tablet Take 1 tablet (500 mg total) by mouth 2 (two) times daily. 20 tablet 0  . clindamycin (CLEOCIN) 150 MG capsule Take 1 capsule (150 mg total) by mouth 3 (three) times daily. 30 capsule 0  . dabigatran (PRADAXA) 150 MG CAPS capsule Take 1 capsule (150 mg total) by mouth 2 (two) times daily. 180 capsule 2  . desoximetasone (TOPICORT) 0.05 % cream Apply 1 application topically 2 (two) times daily as needed (itching).    Marland Kitchen donepezil (ARICEPT) 10 MG tablet Take 1 tablet (10 mg total) by mouth at bedtime. 90 tablet 1  . FLUoxetine (PROZAC) 10 MG capsule Take 10 mg by mouth daily.    Marland Kitchen HYDROcodone-homatropine (HYCODAN) 5-1.5 MG/5ML syrup Take 2.5-5 mLs by mouth at bedtime as needed for cough (Do not operate heavy machinery while taking this medication.). (Patient not taking: Reported on 07/15/2015) 60 mL 0  . lithium carbonate 300 MG capsule Take 300 mg by  mouth daily.    Marland Kitchen loratadine (CLARITIN) 10 MG tablet Take 1 tablet (10 mg total) by mouth daily. (Patient not taking: Reported on 07/15/2015) 90 tablet 3  . losartan (COZAAR) 50 MG tablet Take 1 tablet (50 mg total) by mouth daily. 90 tablet 0  . Melatonin 5 MG CAPS Take 5 mg by mouth at bedtime.    . metFORMIN (GLUCOPHAGE-XR) 500 MG 24 hr tablet Take 1 tablet (500 mg total) by mouth daily with breakfast. 90 tablet 3  . Multiple Vitamin (MULTIVITAMIN) tablet Take 1 tablet by mouth daily.     Marland Kitchen omeprazole (PRILOSEC) 40 MG capsule TAKE 1 CAPSULE EVERY DAY  BEFORE  BREAKFAST 90 capsule 3  . predniSONE (DELTASONE) 20 MG tablet Take 2 tablets (40 mg total) by mouth daily with breakfast.  (Patient not taking: Reported on 07/15/2015) 10 tablet 0  . Propylene Glycol (SYSTANE BALANCE OP) Apply 1 drop to eye at bedtime.    . solifenacin (VESICARE) 5 MG tablet Take 1 tablet (5 mg total) by mouth daily. 90 tablet 3  . SYNTHROID 112 MCG tablet Take 1 tablet (112 mcg total) by mouth daily. Take one 112 mcg brand Synthroid tablet daily. 90 tablet 3   No current facility-administered medications for this visit.    Allergies:   Codeine; Latex; Metronidazole; Other; and Penicillins   Social History:  The patient  reports that she has been smoking Cigarettes.  She has a 24 pack-year smoking history. She has never used smokeless tobacco. She reports that she does not drink alcohol or use illicit drugs.   Family History:  The patient's family history includes CAD in her father and mother; Colon cancer in an other family member; Diabetes in her maternal aunt; Diabetes type II in her sister.  ROS:  Please see the history of present illness.   All other systems are reviewed and otherwise negative.   PHYSICAL EXAM:  VS:  BP 124/76 mmHg  Pulse 67  Ht 5\' 5"  (1.651 m)  Wt 122 lb (55.339 kg)  BMI 20.30 kg/m2 BMI: Body mass index is 20.3 kg/(m^2). Well nourished, but thin, WF in no acute distress HEENT: normocephalic, atraumatic Neck: no JVD, carotid bruits or masses Cardiac:  normal S1, S2; RRR; no significant murmurs, no rubs, or gallops Lungs:  clear to auscultation bilaterally, no wheezing, rhonchi or rales Abd: soft, nontender,  + BS MS: no deformity or atrophy Ext: no edema Skin: warm and dry, no rash.  She has healing wounds to left ankle, dry, no erythema or edema, no pain. Neuro:  No gross deficits appreciated Psych: euthymic mood, full affect  PPM site is stable, no tethering or discomfort   EKG:  Done today shows SR, V paced PPM check today noting battery triggered ERI 07/02/15 and is VVI mode  09/12/07: Echocardiogram SUMMARY - Overall left ventricular systolic function  was normal. Left    ventricular ejection fraction was estimated , range being 55    % to 60 %. There was no diagnostic evidence of left    ventricular regional wall motion abnormalities. Left    ventricular wall thickness was at the upper limits of normal. - Aortic valve thickness was mildly increased. - The possibility of a patent foramen ovale cannot be excluded on    the basis of this study. - There was the appearance of a catheter or pacing wire in the    right ventricle. - The estimated peak pulmonary artery systolic pressure was mild to  moderately increased. - The inferior vena cava was dilated.   Recent Labs: 06/01/2015: TSH 1.632  06/01/2015: Cholesterol 99*; HDL 63; LDL Cholesterol 13; Total CHOL/HDL Ratio 1.6; Triglycerides 116; VLDL 23   CrCl cannot be calculated (Patient has no serum creatinine result on file.).   Wt Readings from Last 3 Encounters:  07/20/15 122 lb (55.339 kg)  07/15/15 118 lb (53.524 kg)  06/21/15 118 lb 9.6 oz (53.797 kg)     Other studies reviewed: Additional studies/records reviewed today include: summarized above  DEVICE: MDT Adapta generator change 2007, original implant 2001  ASSESSMENT AND PLAN:  1.   PPM, MDT device       Q 42month Carelink checks       Battery is ERI, we will schedule a generator change, after she completes her antibiotic therapy for the cat bite and this is healed and treated completely.   2.  PAF on Pradaxa       CHADS2Vasc is 4       No reports of bleeding or signs of bleeding       Needs labs  3.    HTN        Well controlled  4.    CAD        No CP, the patient does not recall prior PTCA         Lipids look great, following with her endo pending a new PMD.  5.    Longstanding tobacco        We discussed at length, she is motivated to quit, will keep trying   Disposition: We will schedule her for a generator change, pre-op labs, BMET and CBC, arrange wound check  post-procedure and then f/u visit. For 62mo  Current medicines are reviewed at length with the patient today.  The patient did not have any concerns regarding medicines.  Addended to include CHADS2Vasc score.  Haywood Lasso, PA-C 07/20/2015 10:23 AM     [place] [place] [address] [phone] (office)  [phone] (fax)

## 2015-08-05 NOTE — Telephone Encounter (Signed)
Message to Dr. Caryl Comes for recommendations.

## 2015-08-05 NOTE — Interval H&P Note (Signed)
History and Physical Interval Note:  08/05/2015 8:03 AM  Stacy [name]  has presented today for surgery, with the diagnosis of SSS  The various methods of treatment have been discussed with the patient and family. After consideration of risks, benefits and other options for treatment, the patient has consented to  Procedure(s):  PPM Generator Changeout (N/A) as a surgical intervention .  The patient's history has been reviewed, patient examined, no change in status, stable for surgery.  I have reviewed the patient's chart and labs.  Questions were answered to the patient's satisfaction.     Virl Axe  RA lead impedance at last check was 67<<699 abruptly   Will confirm at procedure, consider unipolar pacing ( lead polyurethane) or lead replacement  Reviewed with pt and daughter

## 2015-08-06 ENCOUNTER — Encounter (HOSPITAL_COMMUNITY): Payer: Self-pay | Admitting: Internal Medicine

## 2015-08-06 MED FILL — Lidocaine HCl Local Preservative Free (PF) Inj 1%: INTRAMUSCULAR | Qty: 60 | Status: AC

## 2015-08-06 MED FILL — Sodium Chloride Irrigation Soln 0.9%: Qty: 500 | Status: AC

## 2015-08-06 MED FILL — Gentamicin Sulfate Inj 40 MG/ML: INTRAMUSCULAR | Qty: 2 | Status: AC

## 2015-08-13 DIAGNOSIS — F3181 Bipolar II disorder: Secondary | ICD-10-CM | POA: Diagnosis not present

## 2015-08-13 DIAGNOSIS — Z79899 Other long term (current) drug therapy: Secondary | ICD-10-CM | POA: Diagnosis not present

## 2015-08-16 ENCOUNTER — Encounter: Payer: Self-pay | Admitting: Internal Medicine

## 2015-08-16 ENCOUNTER — Ambulatory Visit (INDEPENDENT_AMBULATORY_CARE_PROVIDER_SITE_OTHER): Payer: Commercial Managed Care - HMO | Admitting: *Deleted

## 2015-08-16 DIAGNOSIS — I442 Atrioventricular block, complete: Secondary | ICD-10-CM

## 2015-08-16 DIAGNOSIS — Z45018 Encounter for adjustment and management of other part of cardiac pacemaker: Secondary | ICD-10-CM | POA: Diagnosis not present

## 2015-08-16 NOTE — Progress Notes (Signed)
Pacemaker check in clinic. Normal device function. Thresholds, sensing, impedances consistent with previous measurements. Device programmed to maximize longevity. 2.4% mode switched- on pradaxa. No high ventricular rates noted. Device programmed at appropriate safety margins. Histogram distribution appropriate for patient activity level. Device programmed to optimize intrinsic conduction. Estimated longevity 13.5 years. Patient enrolled in remote follow-up. ROV with SK 11/05/15.

## 2015-08-20 ENCOUNTER — Telehealth: Payer: Self-pay | Admitting: Internal Medicine

## 2015-08-20 NOTE — Telephone Encounter (Signed)
Pt called stating [employer] will be faxing in a request for Accu check [employer] test strips.

## 2015-08-21 NOTE — Telephone Encounter (Signed)
Received papers from [facility] but do not see this on her current med list.. Please advise if pt should be checking sugars and directions if needed.

## 2015-08-21 NOTE — Telephone Encounter (Signed)
She has diabetes and should be checking her sugars once a day and prn.

## 2015-08-23 ENCOUNTER — Encounter: Payer: Self-pay | Admitting: Internal Medicine

## 2015-08-23 NOTE — Telephone Encounter (Signed)
Papers filled out. Will fax once signed.

## 2015-08-24 ENCOUNTER — Encounter: Payer: Self-pay | Admitting: Internal Medicine

## 2015-08-27 NOTE — Telephone Encounter (Signed)
Patient states [employer] called her yesterday and states that they have not heard from our office.  Can you please call patient in regards with an update.

## 2015-08-27 NOTE — Telephone Encounter (Signed)
Spoke with pt to inform.  

## 2015-08-30 ENCOUNTER — Other Ambulatory Visit: Payer: Self-pay | Admitting: Internal Medicine

## 2015-09-07 DIAGNOSIS — D225 Melanocytic nevi of trunk: Secondary | ICD-10-CM | POA: Diagnosis not present

## 2015-09-07 DIAGNOSIS — L821 Other seborrheic keratosis: Secondary | ICD-10-CM | POA: Diagnosis not present

## 2015-09-07 DIAGNOSIS — Z85828 Personal history of other malignant neoplasm of skin: Secondary | ICD-10-CM | POA: Diagnosis not present

## 2015-09-07 DIAGNOSIS — L905 Scar conditions and fibrosis of skin: Secondary | ICD-10-CM | POA: Diagnosis not present

## 2015-09-08 DIAGNOSIS — Z79899 Other long term (current) drug therapy: Secondary | ICD-10-CM | POA: Diagnosis not present

## 2015-09-08 DIAGNOSIS — F3181 Bipolar II disorder: Secondary | ICD-10-CM | POA: Diagnosis not present

## 2015-10-11 ENCOUNTER — Encounter: Payer: Self-pay | Admitting: Internal Medicine

## 2015-10-11 ENCOUNTER — Ambulatory Visit (INDEPENDENT_AMBULATORY_CARE_PROVIDER_SITE_OTHER): Payer: Commercial Managed Care - HMO | Admitting: Internal Medicine

## 2015-10-11 VITALS — BP 140/82 | HR 67 | Temp 97.8°F | Resp 16 | Ht 65.5 in | Wt 119.0 lb

## 2015-10-11 DIAGNOSIS — E038 Other specified hypothyroidism: Secondary | ICD-10-CM

## 2015-10-11 DIAGNOSIS — F319 Bipolar disorder, unspecified: Secondary | ICD-10-CM

## 2015-10-11 DIAGNOSIS — E119 Type 2 diabetes mellitus without complications: Secondary | ICD-10-CM

## 2015-10-11 DIAGNOSIS — I259 Chronic ischemic heart disease, unspecified: Secondary | ICD-10-CM | POA: Diagnosis not present

## 2015-10-11 DIAGNOSIS — E785 Hyperlipidemia, unspecified: Secondary | ICD-10-CM

## 2015-10-11 DIAGNOSIS — Z0000 Encounter for general adult medical examination without abnormal findings: Secondary | ICD-10-CM

## 2015-10-11 DIAGNOSIS — I10 Essential (primary) hypertension: Secondary | ICD-10-CM

## 2015-10-11 NOTE — Assessment & Plan Note (Signed)
Blood pressure slightly more elevated here today, but generally well-controlled Continue current medications

## 2015-10-11 NOTE — Assessment & Plan Note (Signed)
Lab Results  Component Value Date   HGBA1C 6.1* 06/08/2015   Well-controlled Will recheck A1c Managed by endocrine

## 2015-10-11 NOTE — Assessment & Plan Note (Signed)
Check lipid panel Taking atorvastatin 80 mg daily

## 2015-10-11 NOTE — Progress Notes (Signed)
Pre visit review using our clinic review tool, if applicable. No additional management support is needed unless otherwise documented below in the visit note. 

## 2015-10-11 NOTE — Assessment & Plan Note (Signed)
Stable, controlled Following with psychiatry Psychiatry managing medications

## 2015-10-11 NOTE — Patient Instructions (Signed)
Stacy [name] , Thank you for taking time to come for your Medicare Wellness Visit. I appreciate your ongoing commitment to your health goals. Please review the following plan we discussed and let me know if I can assist you in the future.   These are the goals we discussed: Goals    Work on quitting smoking      This is a list of the screening recommended for you and due dates:  Health Maintenance  Topic Date Due  . Eye exam for diabetics  04/15/2013  . Complete foot exam   11/06/2014  . Hemoglobin A1C  12/06/2015  . Flu Shot  03/07/2016  . Mammogram  10/01/2016  . Colon Cancer Screening  11/05/2020  . Tetanus Vaccine  10/06/2024  . DEXA scan (bone density measurement)  Completed  . Shingles Vaccine  Addressed  . Pneumonia vaccines  Completed   Health Maintenance, Female Adopting a healthy lifestyle and getting preventive care can go a long way to promote health and wellness. Talk with your health care provider about what schedule of regular examinations is right for you. This is a good chance for you to check in with your provider about disease prevention and staying healthy. In between checkups, there are plenty of things you can do on your own. Experts have done a lot of research about which lifestyle changes and preventive measures are most likely to keep you healthy. Ask your health care provider for more information. WEIGHT AND DIET  Eat a healthy diet  Be sure to include plenty of vegetables, fruits, low-fat dairy products, and lean protein.  Do not eat a lot of foods high in solid fats, added sugars, or salt.  Get regular exercise. This is one of the most important things you can do for your health.  Most adults should exercise for at least 150 minutes each week. The exercise should increase your heart rate and make you sweat (moderate-intensity exercise).  Most adults should also do strengthening exercises at least twice a week. This is in addition to the  moderate-intensity exercise.  Maintain a healthy weight  Body mass index (BMI) is a measurement that can be used to identify possible weight problems. It estimates body fat based on height and weight. Your health care provider can help determine your BMI and help you achieve or maintain a healthy weight.  For females 71 years of age and older:   A BMI below 18.5 is considered underweight.  A BMI of 18.5 to 24.9 is normal.  A BMI of 25 to 29.9 is considered overweight.  A BMI of 30 and above is considered obese.  Watch levels of cholesterol and blood lipids  You should start having your blood tested for lipids and cholesterol at 75 years of age, then have this test every 5 years.  You may need to have your cholesterol levels checked more often if:  Your lipid or cholesterol levels are high.  You are older than 75 years of age.  You are at high risk for heart disease.  CANCER SCREENING   Lung Cancer  Lung cancer screening is recommended for adults 97-39 years old who are at high risk for lung cancer because of a history of smoking.  A yearly low-dose CT scan of the lungs is recommended for people who:  Currently smoke.  Have quit within the past 15 years.  Have at least a 30-pack-year history of smoking. A pack year is smoking an average of one pack of  cigarettes a day for 1 year.  Yearly screening should continue until it has been 15 years since you quit.  Yearly screening should stop if you develop a health problem that would prevent you from having lung cancer treatment.  Breast Cancer  Practice breast self-awareness. This means understanding how your breasts normally appear and feel.  It also means doing regular breast self-exams. Let your health care provider know about any changes, no matter how small.  If you are in your 20s or 30s, you should have a clinical breast exam (CBE) by a health care provider every 1-3 years as part of a regular health exam.  If  you are 40 or older, have a CBE every year. Also consider having a breast X-ray (mammogram) every year.  If you have a family history of breast cancer, talk to your health care provider about genetic screening.  If you are at high risk for breast cancer, talk to your health care provider about having an MRI and a mammogram every year.  Breast cancer gene (BRCA) assessment is recommended for women who have family members with BRCA-related cancers. BRCA-related cancers include:  Breast.  Ovarian.  Tubal.  Peritoneal cancers.  Results of the assessment will determine the need for genetic counseling and BRCA1 and BRCA2 testing. Cervical Cancer Your health care provider may recommend that you be screened regularly for cancer of the pelvic organs (ovaries, uterus, and vagina). This screening involves a pelvic examination, including checking for microscopic changes to the surface of your cervix (Pap test). You may be encouraged to have this screening done every 3 years, beginning at age 21.  For women ages 30-65, health care providers may recommend pelvic exams and Pap testing every 3 years, or they may recommend the Pap and pelvic exam, combined with testing for human papilloma virus (HPV), every 5 years. Some types of HPV increase your risk of cervical cancer. Testing for HPV may also be done on women of any age with unclear Pap test results.  Other health care providers may not recommend any screening for nonpregnant women who are considered low risk for pelvic cancer and who do not have symptoms. Ask your health care provider if a screening pelvic exam is right for you.  If you have had past treatment for cervical cancer or a condition that could lead to cancer, you need Pap tests and screening for cancer for at least 20 years after your treatment. If Pap tests have been discontinued, your risk factors (such as having a new sexual partner) need to be reassessed to determine if screening should  resume. Some women have medical problems that increase the chance of getting cervical cancer. In these cases, your health care provider may recommend more frequent screening and Pap tests. Colorectal Cancer  This type of cancer can be detected and often prevented.  Routine colorectal cancer screening usually begins at 75 years of age and continues through 75 years of age.  Your health care provider may recommend screening at an earlier age if you have risk factors for colon cancer.  Your health care provider may also recommend using home test kits to check for hidden blood in the stool.  A small camera at the end of a tube can be used to examine your colon directly (sigmoidoscopy or colonoscopy). This is done to check for the earliest forms of colorectal cancer.  Routine screening usually begins at age 50.  Direct examination of the colon should be repeated every 5-10 years   through 75 years of age. However, you may need to be screened more often if early forms of precancerous polyps or small growths are found. Skin Cancer  Check your skin from head to toe regularly.  Tell your health care provider about any new moles or changes in moles, especially if there is a change in a mole's shape or color.  Also tell your health care provider if you have a mole that is larger than the size of a pencil eraser.  Always use sunscreen. Apply sunscreen liberally and repeatedly throughout the day.  Protect yourself by wearing long sleeves, pants, a wide-brimmed hat, and sunglasses whenever you are outside. HEART DISEASE, DIABETES, AND HIGH BLOOD PRESSURE   High blood pressure causes heart disease and increases the risk of stroke. High blood pressure is more likely to develop in:  People who have blood pressure in the high end of the normal range (130-139/85-89 mm Hg).  People who are overweight or obese.  People who are African American.  If you are 18-39 years of age, have your blood pressure  checked every 3-5 years. If you are 40 years of age or older, have your blood pressure checked every year. You should have your blood pressure measured twice--once when you are at a hospital or clinic, and once when you are not at a hospital or clinic. Record the average of the two measurements. To check your blood pressure when you are not at a hospital or clinic, you can use:  An automated blood pressure machine at a pharmacy.  A home blood pressure monitor.  If you are between 55 years and 79 years old, ask your health care provider if you should take aspirin to prevent strokes.  Have regular diabetes screenings. This involves taking a blood sample to check your fasting blood sugar level.  If you are at a normal weight and have a low risk for diabetes, have this test once every three years after 75 years of age.  If you are overweight and have a high risk for diabetes, consider being tested at a younger age or more often. PREVENTING INFECTION  Hepatitis B  If you have a higher risk for hepatitis B, you should be screened for this virus. You are considered at high risk for hepatitis B if:  You were born in a country where hepatitis B is common. Ask your health care provider which countries are considered high risk.  Your parents were born in a high-risk country, and you have not been immunized against hepatitis B (hepatitis B vaccine).  You have HIV or AIDS.  You use needles to inject street drugs.  You live with someone who has hepatitis B.  You have had sex with someone who has hepatitis B.  You get hemodialysis treatment.  You take certain medicines for conditions, including cancer, organ transplantation, and autoimmune conditions. Hepatitis C  Blood testing is recommended for:  Everyone born from 1945 through 1965.  Anyone with known risk factors for hepatitis C. Sexually transmitted infections (STIs)  You should be screened for sexually transmitted infections (STIs)  including gonorrhea and chlamydia if:  You are sexually active and are younger than 75 years of age.  You are older than 75 years of age and your health care provider tells you that you are at risk for this type of infection.  Your sexual activity has changed since you were last screened and you are at an increased risk for chlamydia or gonorrhea. Ask your health   care provider if you are at risk.  If you do not have HIV, but are at risk, it may be recommended that you take a prescription medicine daily to prevent HIV infection. This is called pre-exposure prophylaxis (PrEP). You are considered at risk if:  You are sexually active and do not regularly use condoms or know the HIV status of your partner(s).  You take drugs by injection.  You are sexually active with a partner who has HIV. Talk with your health care provider about whether you are at high risk of being infected with HIV. If you choose to begin PrEP, you should first be tested for HIV. You should then be tested every 3 months for as long as you are taking PrEP.  PREGNANCY   If you are premenopausal and you may become pregnant, ask your health care provider about preconception counseling.  If you may become pregnant, take 400 to 800 micrograms (mcg) of folic acid every day.  If you want to prevent pregnancy, talk to your health care provider about birth control (contraception). OSTEOPOROSIS AND MENOPAUSE   Osteoporosis is a disease in which the bones lose minerals and strength with aging. This can result in serious bone fractures. Your risk for osteoporosis can be identified using a bone density scan.  If you are 65 years of age or older, or if you are at risk for osteoporosis and fractures, ask your health care provider if you should be screened.  Ask your health care provider whether you should take a calcium or vitamin D supplement to lower your risk for osteoporosis.  Menopause may have certain physical symptoms and  risks.  Hormone replacement therapy may reduce some of these symptoms and risks. Talk to your health care provider about whether hormone replacement therapy is right for you.  HOME CARE INSTRUCTIONS   Schedule regular health, dental, and eye exams.  Stay current with your immunizations.   Do not use any tobacco products including cigarettes, chewing tobacco, or electronic cigarettes.  If you are pregnant, do not drink alcohol.  If you are breastfeeding, limit how much and how often you drink alcohol.  Limit alcohol intake to no more than 1 drink per day for nonpregnant women. One drink equals 12 ounces of beer, 5 ounces of wine, or 1 ounces of hard liquor.  Do not use street drugs.  Do not share needles.  Ask your health care provider for help if you need support or information about quitting drugs.  Tell your health care provider if you often feel depressed.  Tell your health care provider if you have ever been abused or do not feel safe at home.   This information is not intended to replace advice given to you by your health care provider. Make sure you discuss any questions you have with your health care provider.   Document Released: 02/06/2011 Document Revised: 08/14/2014 Document Reviewed: 06/25/2013 Elsevier Interactive Patient Education 2016 Else[employer] Inc[employer]  

## 2015-10-11 NOTE — Progress Notes (Signed)
Subjective:    Patient ID: Stacy [name], female    DOB: 10-08-1940, 75 y.o.   MRN: [id number]  HPI Here for medicare wellness.   She is follows with endocrine for her diabetes and thyroid.  Her sugars in the morning are 90-low 100's.    I have personally reviewed and have noted 1.The patient's medical and social history 2.Their use of alcohol, tobacco or illicit drugs 3.Their current medications and supplements 4.The patient's functional ability including ADL's, fall risks, home safety risks and                 hearing or visual impairment. 5.Diet and physical activities 6.Evidence for depression or mood disorders 7.Care team reviewed  - Dr Karsten Ro - endocrinologist, Dr Clovis Pu psychiatry    Are there smokers in your home (other than you)? No  Risk Factors Exercise: walks, few times a week Dietary issues discussed: healthy diet. Compliant with a diabetic diet  Cardiac risk factors: cad, advanced age (older than 62 for men, 69 for women), hypertension, hyperlipidemia  Depression Screen  Have you felt down, depressed or hopeless? No  Have you felt little interest or pleasure in doing things?  No  Activities of Daily Living In your present state of health, do you have any difficulty performing the following activities?:  Driving? No  Managing money?  No  Feeding yourself? no Getting from bed to chair? no Climbing a flight of stairs? no Preparing food and eating?: no Bathing or showering? no Getting dressed: no Getting to/using the toilet? no Moving around from place to place: no In the past year have you fallen or had a near fall?: no   Are you sexually active?  No  Do you have more than one partner?  N/A  Hearing Difficulties:  Do you often ask people to speak up or repeat themselves? yes Do you experience ringing or noises in your ears? No Do you have difficulty understanding soft or whispered  voices? No Vision:              Any change in vision: no              Up to date with eye exam: yes Memory:  Do you feel that you have a problem with memory? No  Do you often misplace items? No  Do you feel safe at home?  Yes  Cognitive Testing  Alert, Orientated? Yes  Normal Appearance? Yes  Recall of three objects?  Yes  Can perform simple calculations? Yes  Displays appropriate judgment? Yes  Can read the correct time from a watch face? Yes   Advanced Directives have been discussed with the patient? Yes - in place   Medications and allergies reviewed with patient and updated if appropriate.  Patient Active Problem List   Diagnosis Date Noted  . Greater trochanteric bursitis of left hip 06/17/2015  . Medicare annual wellness visit, subsequent 10/07/2014  . Routine general medical examination at a health care facility 10/07/2014  . Tremor 04/17/2014  . Allergic rhinitis 08/04/2013  . Hypothyroidism, acquired, autoimmune 05/10/2013  . OAB (overactive bladder)   . Parathyroid related hypercalcemia ([place])   . Primary hyperparathyroidism ([place])   . Vitamin D deficiency disease   . Thyroiditis, autoimmune   . Pacemaker-Medtronic-dual-chamber 11/17/2010  . DIABETES MELLITUS, TYPE II 09/08/2010  . TREMOR 01/17/2010  . ATRIAL FIBRILLATION 11/11/2009  . DEPRESSION 08/11/2009  . ABSCESS OF LIVER 08/11/2009  . TOBACCO ABUSE 03/22/2009  . CORONARY  ARTERY DISEASE, S/P PTCA 03/26/2008  . AV block-complete-intermittent 03/26/2008  . HYPOTHYROIDISM 02/25/2008  . DYSLIPIDEMIA 02/25/2008  . BIPOLAR AFFECTIVE DISORDER 02/25/2008  . HYPERTENSION 02/25/2008  . DIVERTICULOSIS, COLON 02/25/2008    Current Outpatient Prescriptions on File Prior to Visit  Medication Sig Dispense Refill  . atorvastatin (LIPITOR) 80 MG tablet TAKE 1 TABLET EVERY DAY  AT  6:00  PM (Patient taking differently: TAKE 1 TABLET EVERY DAILY AT BEDTIME) 90 tablet 3  . Calcium Carbonate-Vitamin D (CALCIUM-D PO) Take  1 tablet by mouth at bedtime. Calcium 630 mg, vitamin D 500 I.U.    . dabigatran (PRADAXA) 150 MG CAPS capsule Take 1 capsule (150 mg total) by mouth 2 (two) times daily. 180 capsule 2  . desoximetasone (TOPICORT) 0.05 % cream Apply 1 application topically 2 (two) times daily as needed (itching).    Marland Kitchen donepezil (ARICEPT) 10 MG tablet Take 1 tablet (10 mg total) by mouth at bedtime. 90 tablet 1  . ferrous sulfate 325 (65 FE) MG tablet Take 325 mg by mouth daily with breakfast. Sunday, Wednesday, Friday    . FLUoxetine (PROZAC) 10 MG capsule Take 10 mg by mouth daily.    Marland Kitchen loratadine (CLARITIN) 10 MG tablet Take 1 tablet (10 mg total) by mouth daily. 90 tablet 3  . losartan (COZAAR) 50 MG tablet Take 1 tablet (50 mg total) by mouth daily. Keep 10/11/15 appt for future refills 90 tablet 0  . Melatonin 5 MG CAPS Take 5 mg by mouth at bedtime.    . metFORMIN (GLUCOPHAGE-XR) 500 MG 24 hr tablet Take 1 tablet (500 mg total) by mouth daily with breakfast. 90 tablet 3  . Multiple Vitamin (MULTIVITAMIN WITH MINERALS) TABS tablet Take 1 tablet by mouth at bedtime.    Marland Kitchen omeprazole (PRILOSEC) 40 MG capsule TAKE 1 CAPSULE EVERY DAY  BEFORE  BREAKFAST (Patient taking differently: Take 40 mg by mouth daily before breakfast. ) 90 capsule 3  . Propylene Glycol (SYSTANE BALANCE OP) Place 1 drop into both eyes 3 (three) times daily as needed (dry eyes).     . solifenacin (VESICARE) 5 MG tablet Take 1 tablet (5 mg total) by mouth daily. 90 tablet 3  . SYNTHROID 112 MCG tablet Take 1 tablet (112 mcg total) by mouth daily. Take one 112 mcg brand Synthroid tablet daily. (Patient taking differently: Take 112 mcg by mouth daily. Brand Name Synthroid) 90 tablet 3   No current facility-administered medications on file prior to visit.    Past Medical History  Diagnosis Date  . Chronic bipolar disorder ([place])   . Coronary artery disease     s/p PTCA  . Complete AV block ([place])     s/p PPM--MEDTRONIC ADAPTA ADDr01  .  Takotsubo syndrome   . GERD (gastroesophageal reflux disease)   . COPD (chronic obstructive pulmonary disease) ([facility])     TOBACCO ABUSE  . Abscess of liver(572.0)   . Atrial fibrillation ([facility])     chronic anticoag - pradaxa  . BIPOLAR AFFECTIVE DISORDER   . DEPRESSION   . DIABETES MELLITUS, TYPE II dx 04/2010  . DYSLIPIDEMIA   . HYPERTENSION   . HYPOTHYROIDISM     hashimoto's  . TOBACCO ABUSE   . Pacemaker-Medtronic-dual-chamber   . Parathyroid related hypercalcemia ([place])   . Primary hyperparathyroidism ([place])   . Vitamin D deficiency disease   . Cancer ([place])     basal cell on abdomen    Past Surgical History  Procedure Laterality Date  .  Insert / replace / remove pacemaker      MEDTRONIC ADAPTA ADDr01  . Parathyroidectomy      RIGHT INFERIOR  . Abdominal hysterectomy    . Cardiac catheterization    . Mass excision Left 01/27/2013    Procedure: EXCISION MASS LEFT FLANK;  Surgeon: Earnstine Regal, MD;  Location: [facility];  Service: General;  Laterality: Left;  . Ep implantable device N/A 08/05/2015    Procedure:  PPM Generator Changeout;  Surgeon: Deboraha Sprang, MD;  Location: [facility];  Service: Cardiovascular;  Laterality: N/A;    Social History   Social History  . Marital Status: Single    Spouse Name: N/A  . Number of Children: 2  . Years of Education: N/A   Occupational History  .     Social History Main Topics  . Smoking status: Current Every Day Smoker -- 0.50 packs/day for 48 years    Types: Cigarettes  . Smokeless tobacco: Never Used     Comment: switched to vapor cigs. Widowed- 2 girls. Lives with daughter (who is nurse at [employer])  . Alcohol Use: No     Comment: "once in a blue moon" when I have [place] food  . Drug Use: No  . Sexual Activity: No   Other Topics Concern  . None   Social History Narrative   WIDOWED   2 DAUGHTERS   LIVES W/DAUGHTER   CURRENTLY SMOKES   NO ALCOHOL USE   NO ILLICIT DRUG USE   DAILY CAFFEINE USE       PPM-MEDTRONIC   PATIENT SIGNED A DESIGNATED PARTY RELEASE TO ALLOW DAUGHTER, NICOLE COX, TO HAVE ACCESS TO HER MEDICAL RECORDS/INFORMATION. Fleet Contras, November 09, 2009 9:19 AM    Family History  Problem Relation Age of Onset  . CAD Mother   . Colon cancer      FH  . Diabetes Maternal Aunt   . CAD Father   . Diabetes type II Sister     Review of Systems  Constitutional: Negative for fever, chills, appetite change and fatigue.  HENT: Positive for hearing loss.   Eyes: Negative for visual disturbance.  Respiratory: Negative for cough, shortness of breath and wheezing.   Cardiovascular: Negative for chest pain, palpitations and leg swelling.  Gastrointestinal: Negative for nausea, abdominal pain, diarrhea, constipation and blood in stool.       No GERD  Endocrine: Negative for polydipsia and polyuria.  Genitourinary: Negative for dysuria, hematuria and difficulty urinating.  Musculoskeletal: Positive for back pain (intermittent).  Skin: Negative for color change and rash.  Neurological: Negative for dizziness, weakness, light-headedness, numbness and headaches.  Psychiatric/Behavioral: Negative for sleep disturbance and dysphoric mood. The patient is not nervous/anxious.        Objective:   Filed Vitals:   10/11/15 1022  BP: 140/82  Pulse: 67  Temp: 97.8 F (36.6 C)  Resp: 16   Filed Weights   10/11/15 1022  Weight: 119 lb (53.978 kg)   Body mass index is 19.49 kg/(m^2).   Physical Exam Constitutional: She appears well-developed and well-nourished. No distress.  HENT:  Head: Normocephalic and atraumatic.  Right Ear: External ear normal. Normal ear canal and TM Left Ear: External ear normal.  Normal ear canal and TM Mouth/Throat: Oropharynx is clear and moist.  Normal bilateral ear canals and tympanic membranes  Eyes: Conjunctivae and EOM are normal.  Neck: Neck supple. No tracheal deviation present. No thyromegaly present.  No carotid bruit  Cardiovascular:  Normal rate, regular rhythm and normal heart sounds.   No murmur heard.  No edema. Pulmonary/Chest: Effort normal and breath sounds normal. No respiratory-Up-to-date distress. She has no wheezes. She has no rales.  Breast: deferred  Abdominal: Soft. She exhibits no distension. There is no tenderness.  Lymphadenopathy: She has no cervical adenopathy.  Skin: Skin is warm and dry. She is not diaphoretic.  Psychiatric: She has a normal mood and affect. Her behavior is normal.        Assessment & Plan:    Wellness  In Exam: Immunizations  Up to date Colonoscopy up to date Mammogram   up to date dexa up to date Eye exam  up to date Hearing loss - no concerns about mild hearing loss at this time, deferred referral to audiology Memory concerns/difficulties -- no major concerns - has some mild memory issues Independent of ADLs -- independent  Physical exam: Screening blood work - ordered Immunizations up to date Colonoscopy  Up to date Mammogram  Up to date Dexa - up to date Eye exams  Up to date EKG -- done by cardiology Exercise -- exercising regularly, advised continuing regular exercise Weight -- normal BMI, on thin side Skin  -- saw derm and has had skin check Substance abuse - no evidence of abuse  See Problem List for Assessment and Plan of chronic medical problems.

## 2015-10-11 NOTE — Assessment & Plan Note (Signed)
Asymptomatic.  Continue current medications. 

## 2015-10-11 NOTE — Assessment & Plan Note (Signed)
Managed by endocrine.

## 2015-10-15 ENCOUNTER — Other Ambulatory Visit: Payer: Self-pay | Admitting: Internal Medicine

## 2015-10-25 ENCOUNTER — Other Ambulatory Visit: Payer: Self-pay | Admitting: Internal Medicine

## 2015-10-25 ENCOUNTER — Other Ambulatory Visit (INDEPENDENT_AMBULATORY_CARE_PROVIDER_SITE_OTHER): Payer: Commercial Managed Care - HMO

## 2015-10-25 DIAGNOSIS — I259 Chronic ischemic heart disease, unspecified: Secondary | ICD-10-CM | POA: Diagnosis not present

## 2015-10-25 DIAGNOSIS — E119 Type 2 diabetes mellitus without complications: Secondary | ICD-10-CM | POA: Diagnosis not present

## 2015-10-25 DIAGNOSIS — I10 Essential (primary) hypertension: Secondary | ICD-10-CM | POA: Diagnosis not present

## 2015-10-25 LAB — CBC WITH DIFFERENTIAL/PLATELET
BASOS ABS: 0.1 10*3/uL (ref 0.0–0.1)
Basophils Relative: 0.9 % (ref 0.0–3.0)
EOS ABS: 0.2 10*3/uL (ref 0.0–0.7)
Eosinophils Relative: 2.1 % (ref 0.0–5.0)
HEMATOCRIT: 44.1 % (ref 36.0–46.0)
HEMOGLOBIN: 14.8 g/dL (ref 12.0–15.0)
LYMPHS PCT: 39.7 % (ref 12.0–46.0)
Lymphs Abs: 3.1 10*3/uL (ref 0.7–4.0)
MCHC: 33.6 g/dL (ref 30.0–36.0)
MCV: 91.8 fl (ref 78.0–100.0)
MONOS PCT: 6.8 % (ref 3.0–12.0)
Monocytes Absolute: 0.5 10*3/uL (ref 0.1–1.0)
NEUTROS ABS: 4 10*3/uL (ref 1.4–7.7)
Neutrophils Relative %: 50.5 % (ref 43.0–77.0)
PLATELETS: 203 10*3/uL (ref 150.0–400.0)
RBC: 4.81 Mil/uL (ref 3.87–5.11)
RDW: 12.9 % (ref 11.5–15.5)
WBC: 7.9 10*3/uL (ref 4.0–10.5)

## 2015-10-25 LAB — COMPREHENSIVE METABOLIC PANEL
ALBUMIN: 4.3 g/dL (ref 3.5–5.2)
ALK PHOS: 67 U/L (ref 39–117)
ALT: 28 U/L (ref 0–35)
AST: 26 U/L (ref 0–37)
BILIRUBIN TOTAL: 0.4 mg/dL (ref 0.2–1.2)
BUN: 15 mg/dL (ref 6–23)
CALCIUM: 10.1 mg/dL (ref 8.4–10.5)
CO2: 32 meq/L (ref 19–32)
CREATININE: 0.93 mg/dL (ref 0.40–1.20)
Chloride: 104 mEq/L (ref 96–112)
GFR: 62.47 mL/min (ref >60.00)
Glucose, Bld: 133 mg/dL — ABNORMAL HIGH (ref 70–99)
Potassium: 3.8 mEq/L (ref 3.5–5.1)
Sodium: 142 mEq/L (ref 135–145)
TOTAL PROTEIN: 7 g/dL (ref 6.0–8.3)

## 2015-10-25 LAB — LIPID PANEL
CHOLESTEROL: 106 mg/dL (ref 0–200)
HDL: 54.3 mg/dL (ref >39.00)
LDL Cholesterol: 29 mg/dL (ref 0–99)
NonHDL: 51.96
Total CHOL/HDL Ratio: 2
Triglycerides: 114 mg/dL (ref 0.0–149.0)
VLDL: 22.8 mg/dL (ref 0.0–40.0)

## 2015-10-25 LAB — HEMOGLOBIN A1C: HEMOGLOBIN A1C: 6.5 % (ref 4.6–6.5)

## 2015-10-25 MED ORDER — DONEPEZIL HCL 10 MG PO TABS: 10.0000 mg | ORAL_TABLET | Freq: Every day | ORAL | Status: DC

## 2015-10-26 ENCOUNTER — Encounter: Payer: Self-pay | Admitting: Internal Medicine

## 2015-10-27 ENCOUNTER — Other Ambulatory Visit: Payer: Self-pay | Admitting: Internal Medicine

## 2015-11-01 ENCOUNTER — Ambulatory Visit (INDEPENDENT_AMBULATORY_CARE_PROVIDER_SITE_OTHER): Payer: Commercial Managed Care - HMO | Admitting: Internal Medicine

## 2015-11-01 ENCOUNTER — Encounter: Payer: Self-pay | Admitting: Internal Medicine

## 2015-11-01 VITALS — BP 132/86 | HR 78 | Ht 65.0 in | Wt 119.2 lb

## 2015-11-01 DIAGNOSIS — Z950 Presence of cardiac pacemaker: Secondary | ICD-10-CM

## 2015-11-01 DIAGNOSIS — I442 Atrioventricular block, complete: Secondary | ICD-10-CM | POA: Diagnosis not present

## 2015-11-01 LAB — CUP PACEART INCLINIC DEVICE CHECK
Battery Impedance: 100 Ohm
Battery Voltage: 2.8 V
Brady Statistic AP VP Percent: 11 %
Implantable Lead Implant Date: 20010521
Implantable Lead Location: 753860
Implantable Lead Model: 4592
Lead Channel Impedance Value: 1218 Ohm
Lead Channel Pacing Threshold Amplitude: 0.5 V
Lead Channel Pacing Threshold Amplitude: 0.625 V
Lead Channel Pacing Threshold Amplitude: 1.25 V
Lead Channel Pacing Threshold Pulse Width: 0.4 ms
Lead Channel Pacing Threshold Pulse Width: 0.4 ms
Lead Channel Sensing Intrinsic Amplitude: 4 mV
Lead Channel Setting Pacing Pulse Width: 0.4 ms
Lead Channel Setting Sensing Sensitivity: 5.6 mV
MDC IDC LEAD IMPLANT DT: 20010521
MDC IDC LEAD LOCATION: 753859
MDC IDC LEAD MODEL: 6940
MDC IDC MSMT BATTERY REMAINING LONGEVITY: 165 mo
MDC IDC MSMT LEADCHNL RA IMPEDANCE VALUE: 630 Ohm
MDC IDC MSMT LEADCHNL RA PACING THRESHOLD PULSEWIDTH: 0.4 ms
MDC IDC MSMT LEADCHNL RV PACING THRESHOLD AMPLITUDE: 1.25 V
MDC IDC MSMT LEADCHNL RV PACING THRESHOLD PULSEWIDTH: 0.4 ms
MDC IDC MSMT LEADCHNL RV SENSING INTR AMPL: 15.67 mV
MDC IDC SESS DTM: 20170327133931
MDC IDC SET LEADCHNL RA PACING AMPLITUDE: 2 V
MDC IDC SET LEADCHNL RV PACING AMPLITUDE: 2.5 V
MDC IDC STAT BRADY AP VS PERCENT: 43 %
MDC IDC STAT BRADY AS VP PERCENT: 1 %
MDC IDC STAT BRADY AS VS PERCENT: 45 %

## 2015-11-01 NOTE — Progress Notes (Signed)
Patient Care Team: Binnie Rail, MD as PCP - General (Internal Medicine) Deboraha Sprang, MD (Cardiology) Sherrlyn Hock, MD (Endocrinology) Dennard Nip, NP as Nurse Practitioner (Psychiatry) Sable Feil, MD (Gastroenterology) Thalia Bloodgood, OD (Optometry) Berle Mull, MD (Sports Medicine) Eustace Quail Tat, DO (Neurology)   HPI  Stacy [name] is a 75 y.o. female seen in followup for bradycardia and intermittent complete heart block. She is status post pacemaker implantation.  She has a history of coronary disease and has had problems with recurrent chest pain. We undertook Myoview scan in the 2010. She then underwent catheterization demonstrating no obstruction; her previously implanted stent was patent and her ejection fraction was normal    She is smoking a little bit less;  Still with sob  The patient denies chest pain,   nocturnal dyspnea, orthopnea or peripheral edema.  There have been no palpitations, lightheadedness or syncope.         Past Medical History  Diagnosis Date  . Chronic bipolar disorder ([place])   . Coronary artery disease     s/p PTCA  . Complete AV block ([place])     s/p PPM--MEDTRONIC ADAPTA ADDr01  . Takotsubo syndrome   . GERD (gastroesophageal reflux disease)   . COPD (chronic obstructive pulmonary disease) (HCC)     TOBACCO ABUSE  . Abscess of liver(572.0)   . Atrial fibrillation (H[facility])     chronic anticoag - pradaxa  . BIPOLAR AFFECTIVE DISORDER   . DEPRESSION   . DIABETES MELLITUS, TYPE II dx 04/2010  . DYSLIPIDEMIA   . HYPERTENSION   . HYPOTHYROIDISM     hashimoto's  . TOBACCO ABUSE   . Pacemaker-Medtronic-dual-chamber   . Parathyroid related hypercalcemia ([place])   . Primary hyperparathyroidism ([place])   . Vitamin D deficiency disease   . Cancer ([place])     basal cell on abdomen    Past Surgical History  Procedure Laterality Date  . Insert / replace / remove pacemaker      MEDTRONIC ADAPTA ADDr01  . Parathyroidectomy       RIGHT INFERIOR  . Abdominal hysterectomy    . Cardiac catheterization    . Mass excision Left 01/27/2013    Procedure: EXCISION MASS LEFT FLANK;  Surgeon: Earnstine Regal, MD;  Location: [facility];  Service: General;  Laterality: Left;  . Ep implantable device N/A 08/05/2015    Procedure:  PPM Generator Changeout;  Surgeon: Deboraha Sprang, MD;  Location: [facility];  Service: Cardiovascular;  Laterality: N/A;    Current Outpatient Prescriptions  Medication Sig Dispense Refill  . ACCU-CHEK AVIVA PLUS test strip CHECK BLOOD SUGAR ONE TIME DAILY  AND AS NEEDED 200 each 2  . atorvastatin (LIPITOR) 80 MG tablet TAKE 1 TABLET EVERY DAY  AT  6:00PM 90 tablet 3  . Calcium Carbonate-Vitamin D (CALCIUM-D PO) Take 1 tablet by mouth at bedtime. Calcium 630 mg, vitamin D 500 I.U.    . dabigatran (PRADAXA) 150 MG CAPS capsule Take 1 capsule (150 mg total) by mouth 2 (two) times daily. 180 capsule 2  . donepezil (ARICEPT) 10 MG tablet Take 1 tablet (10 mg total) by mouth at bedtime. 90 tablet 1  . ferrous sulfate 325 (65 FE) MG tablet Take 325 mg by mouth daily with breakfast. Sunday, Wednesday, Friday    . FLUoxetine (PROZAC) 10 MG capsule Take 10 mg by mouth daily.    Marland Kitchen lithium carbonate 150 MG capsule Take 150  mg by mouth at bedtime.    Marland Kitchen loratadine (CLARITIN) 10 MG tablet Take 1 tablet (10 mg total) by mouth daily. 90 tablet 3  . losartan (COZAAR) 50 MG tablet Take 1 tablet (50 mg total) by mouth daily. 90 tablet 3  . Melatonin 5 MG CAPS Take 5 mg by mouth at bedtime.    . metFORMIN (GLUCOPHAGE-XR) 500 MG 24 hr tablet Take 1 tablet (500 mg total) by mouth daily with breakfast. 90 tablet 3  . Multiple Vitamin (MULTIVITAMIN WITH MINERALS) TABS tablet Take 1 tablet by mouth at bedtime.    Marland Kitchen omeprazole (PRILOSEC) 40 MG capsule TAKE 1 CAPSULE EVERY DAY  BEFORE  BREAKFAST (Patient taking differently: Take 40 mg by mouth daily before breakfast. ) 90 capsule 3  . Propylene Glycol (SYSTANE BALANCE OP)  Place 1 drop into both eyes 3 (three) times daily as needed (dry eyes).     . solifenacin (VESICARE) 5 MG tablet Take 1 tablet (5 mg total) by mouth daily. 90 tablet 3  . SYNTHROID 112 MCG tablet Take 1 tablet (112 mcg total) by mouth daily. Take one 112 mcg brand Synthroid tablet daily. (Patient taking differently: Take 112 mcg by mouth daily. Brand Name Synthroid) 90 tablet 3  . desoximetasone (TOPICORT) 0.05 % cream Apply 1 application topically 2 (two) times daily as needed (itching). Reported on 11/01/2015     No current facility-administered medications for this visit.    Allergies  Allergen Reactions  . Codeine Nausea And Vomiting  . Latex Other (See Comments)    irritation  . Metronidazole Other (See Comments)    Pt does not recall reaction  . Other Rash    Bandaids cause rash  . Penicillins Swelling and Rash    Throat swelling Has patient had a PCN reaction causing immediate rash, facial/tongue/throat swelling, SOB or lightheadedness with hypotension: Yes Has patient had a PCN reaction causing severe rash involving mucus membranes or skin necrosis: No Has patient had a PCN reaction that required hospitalization No Has patient had a PCN reaction occurring within the last 10 years: Yes If all of the above answers are "NO", then may proceed with Cephalosporin use.    Review of Systems negative except from HPI and PMH  Physical Exam BP 132/86 mmHg  Pulse 78  Ht 5\' 5"  (1.651 m)  Wt 119 lb 3.2 oz (54.069 kg)  BMI 19.84 kg/m2 Well developed and nourished in no acute distress HENT normal Neck supple with JVP-flat Clear Device pocket well healed; without hematoma or erythema.  There is no tethering  Regular rate and rhythm, no murmurs or gallops Abd-soft with active BS No Clubbing cyanosis edema Skin-warm and dry A & Oriented  Grossly normal sensory and motor function  r   ECG demonstrates AV pacing Assessment and  Plan  Sinus node dysfunction   coronary artery  disease with prior stenting  Pacemaker-Medtronic The patient's device was interrogated.  The information was reviewed. No changes were made in the programming.     Hypertension  Atrial fib     Stable post pacemaker generator Replacement.  Without symptoms of ischemia  Blood pressure reasonably controlled

## 2015-11-01 NOTE — Patient Instructions (Signed)
Medication Instructions: - Your physician recommends that you continue on your current medications as directed. Please refer to the Current Medication list given to you today.  Labwork: - none  Procedures/Testing: - none  Follow-Up: - Remote monitoring is used to monitor your Pacemaker of ICD from home. This monitoring reduces the number of office visits required to check your device to one time per year. It allows [place] to keep an eye on the functioning of your device to ensure it is working properly. You are scheduled for a device check from home on 01/31/16. You may send your transmission at any time that day. If you have a wireless device, the transmission will be sent automatically. After your physician reviews your transmission, you will receive a postcard with your next transmission date.  - Your physician wants you to follow-up in: 1 year with Dr. Caryl Comes. You will receive a reminder letter in the mail two months in advance. If you don't receive a letter, please call our office to schedule the follow-up appointment.  Any Additional Special Instructions Will Be Listed Below (If Applicable).     If you need a refill on your cardiac medications before your next appointment, please call your pharmacy.

## 2015-11-05 ENCOUNTER — Encounter: Payer: Commercial Managed Care - HMO | Admitting: Internal Medicine

## 2015-11-15 ENCOUNTER — Other Ambulatory Visit: Payer: Self-pay

## 2015-11-15 DIAGNOSIS — Z1231 Encounter for screening mammogram for malignant neoplasm of breast: Secondary | ICD-10-CM

## 2015-11-16 ENCOUNTER — Ambulatory Visit: Payer: Commercial Managed Care - HMO

## 2015-11-16 ENCOUNTER — Ambulatory Visit
Admission: RE | Admit: 2015-11-16 | Discharge: 2015-11-16 | Disposition: A | Discharge status: Home / Self Care | Payer: Commercial Managed Care - HMO | Source: Ambulatory Visit

## 2015-11-16 DIAGNOSIS — Z1231 Encounter for screening mammogram for malignant neoplasm of breast: Secondary | ICD-10-CM | POA: Diagnosis not present

## 2015-11-22 DIAGNOSIS — F3181 Bipolar II disorder: Secondary | ICD-10-CM | POA: Diagnosis not present

## 2015-12-07 ENCOUNTER — Ambulatory Visit: Payer: Commercial Managed Care - HMO | Admitting: "Endocrinology

## 2015-12-15 ENCOUNTER — Encounter: Payer: Self-pay | Admitting: "Endocrinology

## 2015-12-15 ENCOUNTER — Ambulatory Visit (INDEPENDENT_AMBULATORY_CARE_PROVIDER_SITE_OTHER): Payer: Commercial Managed Care - HMO | Admitting: "Endocrinology

## 2015-12-15 VITALS — BP 141/74 | HR 83 | Wt 119.2 lb

## 2015-12-15 DIAGNOSIS — I10 Essential (primary) hypertension: Secondary | ICD-10-CM

## 2015-12-15 DIAGNOSIS — E559 Vitamin D deficiency, unspecified: Secondary | ICD-10-CM

## 2015-12-15 DIAGNOSIS — E049 Nontoxic goiter, unspecified: Secondary | ICD-10-CM

## 2015-12-15 DIAGNOSIS — R7303 Prediabetes: Secondary | ICD-10-CM | POA: Diagnosis not present

## 2015-12-15 DIAGNOSIS — F3131 Bipolar disorder, current episode depressed, mild: Secondary | ICD-10-CM

## 2015-12-15 DIAGNOSIS — E038 Other specified hypothyroidism: Secondary | ICD-10-CM | POA: Diagnosis not present

## 2015-12-15 DIAGNOSIS — Z720 Tobacco use: Secondary | ICD-10-CM

## 2015-12-15 DIAGNOSIS — R5383 Other fatigue: Secondary | ICD-10-CM

## 2015-12-15 DIAGNOSIS — E211 Secondary hyperparathyroidism, not elsewhere classified: Secondary | ICD-10-CM

## 2015-12-15 DIAGNOSIS — E063 Autoimmune thyroiditis: Secondary | ICD-10-CM | POA: Diagnosis not present

## 2015-12-15 DIAGNOSIS — R634 Abnormal weight loss: Secondary | ICD-10-CM

## 2015-12-15 LAB — GLUCOSE, POCT (MANUAL RESULT ENTRY): POC GLUCOSE: 152 mg/dL — AB (ref 70–99)

## 2015-12-15 LAB — POCT GLYCOSYLATED HEMOGLOBIN (HGB A1C): HEMOGLOBIN A1C: 6.3

## 2015-12-15 NOTE — Patient Instructions (Signed)
Follow up visit in late November or December. Please repeat lab tests two weeks prior.

## 2015-12-15 NOTE — Progress Notes (Signed)
Subjective:  Patient Name: Stacy [name] Date of Birth: December 04, 1940  MRN: [id number]  Stacy [name]  presents to the office today for follow-up of her T2DM, hypothyroidism, thyroiditis, hypertension, fatigue, secondary hyperparathyroidism, vitamin D deficiency, hyperlipidemia, tobacco abuse, and tremor.  HISTORY OF PRESENT ILLNESS:   Stacy [name] is a 75 y.o. Caucasian woman.  Stacy [name] was unaccompanied.  1. Stacy [name] was first referred to me on 09/04/05 for evaluation and management of chronic hypothyroidism and new hypercalcemia and hyperparathyroidism.   A. She had developed hypothyroidism due to thyroiditis some 15 years before and was being treated with Synthroid. Since that first visit with me she has had some waxing and waning of her thyroid gland size which is consistent with inflammatory flare-ups of her Hashimoto's disease. We've increased her thyroid hormone dose slightly over time. She now takes 112 mcg of Synthroid daily. She has remained euthyroid most of the time.   B. In the ten years prior to seeing me for the first time she had developed vitamin D deficiency, then secondary hyperparathyroidism, and then tertiary hyperparathyroidism. Her right inferior parathyroid gland was quite enlarged. She was taken to surgery by Dr. Armandina Gemma on 12/07/05 for parathyroidectomy. Since that time she's done well, provided that she has continued to take her calcium and vitamin D.    2. The patient has experienced many other problems during the last 10 years, to include bipolar disorder, GERD, fatigue, iron deficiency, 2 heart attacks, hypertension, orthostatic hypotension, atrial fibrillation, and the need for a cardiac pacemaker. We've helped to manage some of those problems and kept her as supplied as we could with samples of medications. She was diagnosed with type 2 diabetes in early 2012 and was started on metformin 500 mg once daily. She was also started on atorvastatin for hyperlipidemia.  3. The  patient's last [facility] visit was on 06/08/15.   A. In the interim, her cardiac pacemaker generator was changed out on 08/05/15.   B. She has been healthy and had been doing well emotionally until 3 months ago, when her psych PA reduced her lithium to 150 mg/day. She felt well initially, but about a month ago she was more temperamental, harder to live with, and had decreased appetite. In the past 2 weeks, however, she has felt back to normal. She is not aware of any medication change or clinical change that might explain these shifts in symptoms.   C. She continues to have problems with arthritis in her left ankle, knee, and hip. She saw an orthopedist who gave her some exercises to do and asked her to call him if she was not better. She has not yet done so. She has not been able to do too much walking.   D. Her appetite is still not good and she is eating less over time. She has intentionally reduced her intake of sweets and other carbs, to include honey, but sometimes still indulges  E. She is taking Synthroid, 112 mcg/day and metformin, 500 mg, once daily. She also takes atorvastatin, calcium carbonate, fluoxetine, donepezil, Pradaxa, omeprazole, lithium, losartan, and solifenacin. .  F. She has not had any problems with vertigo since using an abdominal belt prescribed by Dr. Caryl Comes. Her allergies have bothered her fairly consistently.    G. She has resumed smoking cigarettes.   4. Pertinent Review of Systems:  Constitutional: The patient feels "pretty good".  She continues to have problems with insomnia and early awakening at times.  She also has frequent nocturia. She also sometimes  has daytime urinary incontinence. Her energy level is pretty good. She has not been walking much. Her attitude has improved since she resumed actively participating in the events of her church. Her mood has been good recently.  Eyes: Vision is good as long as she wears her glasses. She had her last eye exam in about early  September 2016. No eye disease was noted. There are no other significant eye complaints. Neck: The patient has no complaints of anterior neck swelling, soreness, tenderness,  pressure, discomfort, or difficulty swallowing.  Heart: She has no complaints of palpitations, irregular heat beats, chest pain, or chest pressure. Gastrointestinal: She has not had any constipation and loose stools since starting Benefiber. The patient has no complaints of acid reflux, upset stomach, stomach aches or pains. Arms: She occasionally has pain in her left shoulder (trapezius areas) when lying on her left side, but not as often as at her last visit.  Legs: She has more pains in her left hip, left knee, and left ankle. Muscle mass and strength seem normal. There are no complaints of numbness, tingling, or burning. No edema is noted. Feet: There are no obvious foot problems. There are no complaints of numbness, tingling, burning, or pain. No edema is noted. Hypoglycemia: None  5. BG printout: Her morning BGs vary from 99-122 on most days. She had a morning BG of 134 one Sunday morning. She also had one postprandial BG of 151 one late morning.   PAST MEDICAL, FAMILY, AND SOCIAL HISTORY:  Past Medical History  Diagnosis Date  . Chronic bipolar disorder ([place])   . Coronary artery disease     s/p PTCA  . Complete AV block ([place])     s/p PPM--MEDTRONIC ADAPTA ADDr01  . Takotsubo syndrome   . GERD (gastroesophageal reflux disease)   . COPD (chronic obstructive pulmonary disease) ([facility])     TOBACCO ABUSE  . Abscess of liver(572.0)   . Atrial fibrillation ([facility])     chronic anticoag - pradaxa  . BIPOLAR AFFECTIVE DISORDER   . DEPRESSION   . DIABETES MELLITUS, TYPE II dx 04/2010  . DYSLIPIDEMIA   . HYPERTENSION   . HYPOTHYROIDISM     hashimoto's  . TOBACCO ABUSE   . Pacemaker-Medtronic-dual-chamber   . Parathyroid related hypercalcemia ([place])   . Primary hyperparathyroidism ([place])   . Vitamin D deficiency  disease   . Cancer ([place])     basal cell on abdomen    Family History  Problem Relation Age of Onset  . CAD Mother   . Colon cancer      FH  . Diabetes Maternal Aunt   . CAD Father   . Diabetes type II Sister      Current outpatient prescriptions:  .  ACCU-CHEK AVIVA PLUS test strip, CHECK BLOOD SUGAR ONE TIME DAILY  AND AS NEEDED, Disp: 200 each, Rfl: 2 .  atorvastatin (LIPITOR) 80 MG tablet, TAKE 1 TABLET EVERY DAY  AT  6:00PM, Disp: 90 tablet, Rfl: 3 .  Calcium Carbonate-Vitamin D (CALCIUM-D PO), Take 1 tablet by mouth at bedtime. Calcium 630 mg, vitamin D 500 I.U., Disp: , Rfl:  .  dabigatran (PRADAXA) 150 MG CAPS capsule, Take 1 capsule (150 mg total) by mouth 2 (two) times daily., Disp: 180 capsule, Rfl: 2 .  desoximetasone (TOPICORT) 0.05 % cream, Apply 1 application topically 2 (two) times daily as needed (itching). Reported on 11/01/2015, Disp: , Rfl:  .  donepezil (ARICEPT) 10 MG tablet, Take 1 tablet (10  mg total) by mouth at bedtime., Disp: 90 tablet, Rfl: 1 .  ferrous sulfate 325 (65 FE) MG tablet, Take 325 mg by mouth daily with breakfast. Sunday, Wednesday, Friday, Disp: , Rfl:  .  FLUoxetine (PROZAC) 10 MG capsule, Take 10 mg by mouth daily., Disp: , Rfl:  .  lithium carbonate 150 MG capsule, Take 150 mg by mouth at bedtime., Disp: , Rfl:  .  loratadine (CLARITIN) 10 MG tablet, Take 1 tablet (10 mg total) by mouth daily., Disp: 90 tablet, Rfl: 3 .  losartan (COZAAR) 50 MG tablet, Take 1 tablet (50 mg total) by mouth daily., Disp: 90 tablet, Rfl: 3 .  Melatonin 5 MG CAPS, Take 5 mg by mouth at bedtime., Disp: , Rfl:  .  metFORMIN (GLUCOPHAGE-XR) 500 MG 24 hr tablet, Take 1 tablet (500 mg total) by mouth daily with breakfast., Disp: 90 tablet, Rfl: 3 .  Multiple Vitamin (MULTIVITAMIN WITH MINERALS) TABS tablet, Take 1 tablet by mouth at bedtime., Disp: , Rfl:  .  omeprazole (PRILOSEC) 40 MG capsule, TAKE 1 CAPSULE EVERY DAY  BEFORE  BREAKFAST (Patient taking differently:  Take 40 mg by mouth daily before breakfast. ), Disp: 90 capsule, Rfl: 3 .  Propylene Glycol (SYSTANE BALANCE OP), Place 1 drop into both eyes 3 (three) times daily as needed (dry eyes). , Disp: , Rfl:  .  solifenacin (VESICARE) 5 MG tablet, Take 1 tablet (5 mg total) by mouth daily., Disp: 90 tablet, Rfl: 3 .  SYNTHROID 112 MCG tablet, Take 1 tablet (112 mcg total) by mouth daily. Take one 112 mcg brand Synthroid tablet daily. (Patient taking differently: Take 112 mcg by mouth daily. Brand Name Synthroid), Disp: 90 tablet, Rfl: 3  Allergies as of 12/15/2015 - Review Complete 11/01/2015  Allergen Reaction Noted  . Codeine Nausea And Vomiting   . Latex Other (See Comments) 05/03/2012  . Metronidazole Other (See Comments) 03/26/2008  . Other Rash 01/24/2013  . Penicillins Swelling and Rash     1. Work and Family: Her daughter, [name], lives with her. [name] works as a Marine scientist at the [employer].   2. Activities: Stacy [name] has been walking less due to her arthritis.      3. Smoking, alcohol, or drugs: She resumed smoking cigarettes.    4. Primary Care Provider: Binnie Rail, MD  5. Psych: Comer Locket, PA, [facility], office [phone], fax [phone]  REVIEW OF SYSTEMS: There are no other significant problems involving Stacy [name]'s other body systems.   Objective:  Vital Signs:  BP 141/74 mmHg  Pulse 83  Wt 119 lb 3.2 oz (54.069 kg)   Ht Readings from Last 3 Encounters:  11/01/15 5\' 5"  (1.651 m)  10/11/15 5' 5.5" (1.664 m)  08/05/15 5\' 6"  (1.676 m)   Wt Readings from Last 3 Encounters:  12/15/15 119 lb 3.2 oz (54.069 kg)  11/01/15 119 lb 3.2 oz (54.069 kg)  10/11/15 119 lb (53.978 kg)   PHYSICAL EXAM:  Constitutional: The patient appears healthy and well nourished, albeit fairly slender for a woman in the [place] at age 72. She looks good.  Her weight has decreased by 9 ounces. Her mood and affect are quite normal. Her insight is good.  Head: Her head  tremor is still present, a bit more obvious since stopping the propranolol. Face: The face appears normal.  Eyes: There is no obvious arcus or proptosis. Moisture appears normal. Mouth: The oropharynx and tongue appear normal. Oral moisture is  normal. Neck: The neck appears to be visibly normal. No carotid bruits are noted. She has a low-lying thyroid gland. The thyroid gland is within normal size at 18-20 grams. Both lobes today are within normal limits for size. The consistency of the thyroid gland is normal. The thyroid gland is not tender to palpation. Lungs: The lungs are clear to auscultation. Air movement is good. Heart: Heart rate and rhythm are regular. Heart sounds S1 and S2 are normal. I did not appreciate any pathologic cardiac murmurs. Abdomen: The abdomen is mildly enlarged. Bowel sounds are normal. There is no obvious hepatomegaly, splenomegaly, or other mass effect.  Arms: Muscle size and bulk are normal for age.  Hands: She has a 1+ hand tremor. Phalangeal and metacarpophalangeal joints are normal. Palmar muscles are normal. Palmar skin is normal. Palmar moisture is also normal. Fingernails are somewhat pale. Legs: Muscles appear normal for age. No edema is present. Feet: She has a 1+ dorsalis pedis pulses bilaterally. Her feet look good. Neurologic: Strength is normal for age in both the upper and lower extremities. Muscle tone is normal. Sensation to touch is normal in both legs and feet. She walked normally, without any of the hesitation and rigidity usually seen with Parkinson's disease.   LAB DATA:   Labs 12/15/15: HbA1c 6.3%.  Labs 10/25/15: HbA1c 6.5%; CBC normal; CMP normal except for glucose 133; cholesterol 106, triglycerides 114, HDL 54.30, LDL 29  Labs 06/08/15: HbA1c 6/1%; lithium 0.50 (normal 0.80-1.40); vitamin B6 20.5 (normal 2.1-21.7), vitamin B12 1500 (normal 211-911) [Her vitamin B12 level is above the upper limit of normal as defined by the [employer]. She is  obviously taking her MVI and her metformin is not causing any Vitamin B12 deficiency. A review of Vitamin B12 by the NIH Office of Dietary Supplements noted a review by the Institute of Medicine that stated "no adverse effects have been associated with excess vitamin B12 intake from food and supplements in healthy individuals". At this point in time I do not see a need to change her MVI dose.]   Labs 06/01/15: TSH 1.632, free T4 1.10, free T3 2.5; PTH 37, calcium 9.8, 25-OH vitamin D 45; cholesterol 99, triglycerides 116, HDL 63, LDL 13  Labs 12/01/14: Hemoglobin A1c was 5.7%.compared with 5.6% at last visit, and with 6.1% at the visit prior.    Labs 10/07/14: Calcium 9.9, PTH 49, 25-OH vitamin D 53; cholesterol 109, triglycerides 109, HDL 69, LDL 18; TSH 3.734, free T4; lithium 0.60 (0.80-1.40)  Labs 12/15/13: TSH 4.984, free T4 1.11, free T3 2.6; lithium 0.60  Labs 11/05/13: Cholesterol 117, triglycerides 112, HDL 63.5, LDL 31; TSH 4.84  Labs 05/05/13: TSH 3.575, free T4 1.46, free T3 2.5  Labs 01/30/13: Cholesterol 93, triglycerides 61, HDL 56, LDL 24  Labs 10/30/12: TSH 2.550, free T4 1.24, free T3 2.5; calcium 10.4, PTH 36.3, 24-hydroxy vitamin D 42; lithium 0.80 (0.80-1.40)   Labs 04/18/12: TSH was 2.106, Free T4 was 1.41. Free T3 was 2.8.                   Labs 10/25/11: PTH 30.8, calcium 10.4, 25-hydroxy vitamin D 43; WBC 7.8, hemoglobin 14.9, hematocrit 46.9%, iron 71             Assessment and Plan:   ASSESSMENT:  1&2. Hypothyroidism/ Hashimoto's thyroiditis:   A. Her TFTs in September 2014, at first glance, did not make sense. This pattern was c/w a recent flare up of Hashimoto's Dz. We  could not make any decisions about whether or not to change her Synthroid dose until the TFTs stabilized.   B. Her TFTs in April 2015 were mildly low. Since she was in the midst of an episode of lithium toxicity and may have missed some Synthroid doses, I decided not to change her Synthroid dose.     C. Her TFTs in March 2016 were slightly low. Her TFTs in October 2016, however, were mid-range normal on her current dose of Synthroid.  We need to repeat her TFTs prior to her next visit.   D. Her thyroiditis is again clinically quiescent  3. Hypertension: Her blood pressure is pretty good today. I again suggested that she try to use her exercise bike.     4. T2DM/prediabetes: Her HbA1c value is less today than in March, but is still in the prediabetic zone. Since she can't exercise as much, she needs to be very careful with her diet. Because she can't exercise as much, we need to increase her metformin to twice daily.  5.  Fatigue: She is really feeling very well. 6.  Hyperparathyroidism and vitamin D deficiency: Her labs in March and October 2016 were normal. She was very compliant with taking her Caltrate-D and MVI each evening.   7. Tobacco abuse: She resumed smoking regular cigarettes against my advice and Dr. Olin Pia advice. Her daughter tells her that the cigarettes are going to kill her. I told her the same thing. .  8. Tremor: Her tremor is not due to Parkinson's Dz, or to deficiencies of either vitamin B6 or vitamin B12. 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 She was doing better with propranolol.   9. Pallor: Her pallor has resolved. Her iron level, hemoglobin, and hematocrit were fine 2 years ago and again in March 2017.  10. Goiter: Her thyroid gland is again within normal size today.  11. Hyperlipidemia: Her lipids were very good in March and October 2016 and in March 2017. 12. Bipolar disorder: She looks well today, but has had some unusual symptoms in the past 1-2 months. Mr. Jacques Earthly is following this issue.  13. Unintentional weight loss:  Her weight has stabilized.   PLAN:  1. Diagnostic:  HbA1c today. TFTs prior to her next visit.  2. Therapeutic:  Eat Right, exercise right (walk 30-60 minutes per day or use her exercise bike), and take your Synthroid, calcium, and metformin.   3. Patient education: We talked a lot about her tremor, her smoking, and her thyroiditis and hypothyroidism.  4. Follow-up: 6 months with me. Please schedule FU visits with Dr. Quay Burow and Mr. Jacques Earthly as they desire.  Level of Service: This visit lasted in excess of 55 minutes. More than 50% of the visit was devoted to counseling.   Sherrlyn Hock, MD 12/15/2015 9:49 AM

## 2015-12-23 ENCOUNTER — Telehealth: Payer: Self-pay | Admitting: "Endocrinology

## 2015-12-23 NOTE — Telephone Encounter (Signed)
Routed to provider

## 2015-12-24 ENCOUNTER — Other Ambulatory Visit: Payer: Commercial Managed Care - HMO

## 2015-12-24 ENCOUNTER — Telehealth: Payer: Self-pay | Admitting: Internal Medicine

## 2015-12-24 DIAGNOSIS — F319 Bipolar disorder, unspecified: Secondary | ICD-10-CM | POA: Diagnosis not present

## 2015-12-24 DIAGNOSIS — Z79899 Other long term (current) drug therapy: Secondary | ICD-10-CM | POA: Diagnosis not present

## 2015-12-24 NOTE — Telephone Encounter (Signed)
ordered

## 2015-12-24 NOTE — Telephone Encounter (Signed)
Lovena Le,  Please fax the results to [facility] @ [phone]

## 2015-12-24 NOTE — Telephone Encounter (Signed)
Called and advised.

## 2015-12-24 NOTE — Telephone Encounter (Signed)
Patient walked in today with a written rX from Dr Clovis Pu for lab orders. She is asking that you put these orders in here so that she can get them done at the lab downstairs. She does not want to drive to [facility]  [facility] Z79.899 F31.9

## 2015-12-24 NOTE — Telephone Encounter (Signed)
Look and labs have not resulted. Will review again on Monday.

## 2015-12-25 ENCOUNTER — Encounter: Payer: Self-pay | Admitting: Internal Medicine

## 2015-12-25 LAB — LITHIUM LEVEL: Lithium Lvl: 0.4 mEq/L — ABNORMAL LOW (ref 0.80–1.40)

## 2015-12-27 NOTE — Telephone Encounter (Signed)
Pt called back again. Please call her back @ [phone].

## 2015-12-28 NOTE — Telephone Encounter (Signed)
Results faxed to number listed below.

## 2015-12-30 DIAGNOSIS — F3181 Bipolar II disorder: Secondary | ICD-10-CM | POA: Diagnosis not present

## 2016-01-09 ENCOUNTER — Telehealth: Payer: Self-pay | Admitting: "Endocrinology

## 2016-01-09 NOTE — Telephone Encounter (Signed)
1. Patient had called the nurses to ask if she should be taking metformin, 500 mg, twice daily. Although it was clear in the chart that she was supposed to take the metformin twice daily, the nurses asked me to call her. 2. I called the patient and reaffirmed that she is supposed to be taking metformin, 500 mg, twice daily.  Sherrlyn Hock

## 2016-01-12 ENCOUNTER — Other Ambulatory Visit: Payer: Self-pay | Admitting: Internal Medicine

## 2016-01-18 ENCOUNTER — Other Ambulatory Visit: Payer: Self-pay | Admitting: Internal Medicine

## 2016-01-20 ENCOUNTER — Other Ambulatory Visit: Payer: Self-pay | Admitting: Emergency Medicine

## 2016-01-20 MED ORDER — DONEPEZIL HCL 10 MG PO TABS: 10.0000 mg | ORAL_TABLET | Freq: Every day | ORAL | Status: DC

## 2016-01-28 ENCOUNTER — Ambulatory Visit (INDEPENDENT_AMBULATORY_CARE_PROVIDER_SITE_OTHER): Payer: Commercial Managed Care - HMO | Admitting: *Deleted

## 2016-01-28 DIAGNOSIS — I442 Atrioventricular block, complete: Secondary | ICD-10-CM

## 2016-01-28 NOTE — Progress Notes (Signed)
Remote pacemaker transmission.   

## 2016-01-29 LAB — CUP PACEART REMOTE DEVICE CHECK
Battery Impedance: 100 Ohm
Battery Remaining Longevity: 166 mo
Battery Voltage: 2.8 V
Brady Statistic AP VP Percent: 5 %
Brady Statistic AP VS Percent: 40 %
Brady Statistic AS VP Percent: 1 %
Implantable Lead Implant Date: 20010521
Implantable Lead Implant Date: 20010521
Implantable Lead Location: 753859
Implantable Lead Model: 4592
Implantable Lead Model: 6940
Lead Channel Impedance Value: 1179 Ohm
Lead Channel Impedance Value: 629 Ohm
Lead Channel Pacing Threshold Amplitude: 0.625 V
Lead Channel Pacing Threshold Pulse Width: 0.4 ms
Lead Channel Pacing Threshold Pulse Width: 0.4 ms
Lead Channel Setting Pacing Amplitude: 2 V
Lead Channel Setting Pacing Amplitude: 3 V
MDC IDC LEAD LOCATION: 753860
MDC IDC MSMT LEADCHNL RA SENSING INTR AMPL: 1.4 mV
MDC IDC MSMT LEADCHNL RV PACING THRESHOLD AMPLITUDE: 1.5 V
MDC IDC MSMT LEADCHNL RV SENSING INTR AMPL: 11.2 mV
MDC IDC SESS DTM: 20170623165148
MDC IDC SET LEADCHNL RV PACING PULSEWIDTH: 0.4 ms
MDC IDC SET LEADCHNL RV SENSING SENSITIVITY: 4 mV
MDC IDC STAT BRADY AS VS PERCENT: 53 %

## 2016-01-31 ENCOUNTER — Encounter: Payer: Self-pay | Admitting: Cardiology

## 2016-02-14 ENCOUNTER — Encounter: Payer: Self-pay | Admitting: Cardiology

## 2016-02-24 DIAGNOSIS — F3181 Bipolar II disorder: Secondary | ICD-10-CM | POA: Diagnosis not present

## 2016-04-01 NOTE — Progress Notes (Signed)
[name] [address], [place] [address] Phone: [phone] Subjective:    I'm seeing this patient by the request  of:  Binnie Rail, MD   CC: Bilateral hip and bilateral shoulder pain.    [id number]  Stacy [name] is a 75 y.o. female coming in with complaint of left hip pain. Seen 10 months ago and was given GT injection.  Patient states that she is having pain in both hips. States that it is bilateral. Worse with getting up from a seated position. States that she feels she has some weakness. Also is having bilateral shoulder pain. Discuss it as a dull, throbbing aching sensation. Has had weight loss but states that she has not been eating as well. Patient has been trying a vegetarian diet. Patient states that the soreness is starting to stop her from activities. Is going on a cruise in 4 weeks and wants to feel better. Wondering to know what she can do.     Past Medical History:  Diagnosis Date  . Abscess of liver(572.0)   . Atrial fibrillation (HCC)    chronic anticoag - pradaxa  . BIPOLAR AFFECTIVE DISORDER   . Cancer ([place])    basal cell on abdomen  . Chronic bipolar disorder ([place])   . Complete AV block ([place])    s/p PPM--MEDTRONIC ADAPTA ADDr01  . COPD (chronic obstructive pulmonary disease) (HCC)    TOBACCO ABUSE  . Coronary artery disease    s/p PTCA  . DEPRESSION   . DIABETES MELLITUS, TYPE II dx 04/2010  . DYSLIPIDEMIA   . GERD (gastroesophageal reflux disease)   . HYPERTENSION   . HYPOTHYROIDISM    hashimoto's  . Pacemaker-Medtronic-dual-chamber   . Parathyroid related hypercalcemia ([place])   . Primary hyperparathyroidism ([place])   . Takotsubo syndrome   . TOBACCO ABUSE   . Vitamin D deficiency disease    Past Surgical History:  Procedure Laterality Date  . ABDOMINAL HYSTERECTOMY    . CARDIAC CATHETERIZATION    . EP IMPLANTABLE DEVICE N/A 08/05/2015   Procedure:  PPM Generator Changeout;  Surgeon: Deboraha Sprang,  MD;  Location: [facility];  Service: Cardiovascular;  Laterality: N/A;  . INSERT / REPLACE / REMOVE PACEMAKER     MEDTRONIC ADAPTA ADDr01  . MASS EXCISION Left 01/27/2013   Procedure: EXCISION MASS LEFT FLANK;  Surgeon: Earnstine Regal, MD;  Location: [facility];  Service: General;  Laterality: Left;  . PARATHYROIDECTOMY     RIGHT INFERIOR   Social History  Substance Use Topics  . Smoking status: Current Every Day Smoker    Packs/day: 0.50    Years: 48.00    Types: Cigarettes  . Smokeless tobacco: Never Used     Comment: switched to vapor cigs. Widowed- 2 girls. Lives with daughter (who is nurse at [employer])  . Alcohol use No     Comment: "once in a blue moon" when I have [place] food   Allergies  Allergen Reactions  . Codeine Nausea And Vomiting  . Latex Other (See Comments)    irritation  . Metronidazole Other (See Comments)    Pt does not recall reaction  . Other Rash    Bandaids cause rash  . Penicillins Swelling and Rash    Throat swelling Has patient had a PCN reaction causing immediate rash, facial/tongue/throat swelling, SOB or lightheadedness with hypotension: Yes Has patient had a PCN reaction causing severe rash involving mucus membranes  or skin necrosis: No Has patient had a PCN reaction that required hospitalization No Has patient had a PCN reaction occurring within the last 10 years: Yes If all of the above answers are "NO", then may proceed with Cephalosporin use.   Family History  Problem Relation Age of Onset  . CAD Mother   . Colon cancer      FH  . Diabetes Maternal Aunt   . CAD Father   . Diabetes type II Sister      Past medical history, social, surgical and family history all reviewed in electronic medical record.   Review of Systems: No headache, visual changes, nausea, vomiting, diarrhea, constipation, dizziness, abdominal pain, skin rash, fevers, chills, night sweats, weight loss, swollen lymph nodes, chest pain, shortness of breath, mood  changes.   Objective  Blood pressure 104/78, pulse 80, weight 113 lb (51.3 kg), SpO2 90 %.  General: No apparent distress alert and oriented x3 mood and affect normal, dressed appropriately.  HEENT: Pupils equal, extraocular movements intact  Respiratory: Patient's speak in full sentences and does not appear short of breath  Cardiovascular: No lower extremity edema, non tender, no erythema  Skin: Warm dry intact with no signs of infection or rash on extremities or on axial skeleton.  Abdomen: Soft nontender  Neuro: Cranial nerves II through XII are intact, neurovascularly intact in all extremities with 2+ DTRs and 2+ pulses.  Lymph: No lymphadenopathy of posterior or anterior cervical chain or axillae bilaterally.  Gait normal with good balance and coordination.  MSK:  Non tender with full range of motion and good stability and symmetric strength and tone of  elbows, wrist,  knee and ankles bilaterally. Arthritic changes of multiple joints Hip: Bilateral ROM IR: 15 Deg, ER: 35 Deg, Flexion: 120 Deg, Extension: 100 Deg, Abduction: 45 Deg, Adduction: 25 Deg Strength IR: 4/5, ER: 4/5, Flexion: 5/5, Extension: 4/5, Abduction: 3+/5, Adduction: 5/5 Pelvic alignment unremarkable to inspection and palpation. Standing hip rotation and gait without trendelenburg sign / unsteadiness. Diffuse tenderness over the greater trochanteric area, gluteal areas, as well as the upper thighs bilaterally. Tenderness over the piriformis Positive Corky Sox on left side Mild discomfort over the sacroiliac joints bilaterally  Shoulder: Bilateral Inspection reveals no abnormalities, atrophy or asymmetry. She is tenderness of the soft tissue of the shoulders bilaterally. Next last 5 in all planes. Rotator cuff strength normal throughout. No signs of impingement with negative Neer and Hawkin's tests, empty can sign. Speeds and Yergason's tests normal. No labral pathology noted with negative Obrien's, negative clunk  and good stability. No apprehension sign       Impression and Recommendations:     This case required medical decision making of moderate complexity.

## 2016-04-03 ENCOUNTER — Other Ambulatory Visit (INDEPENDENT_AMBULATORY_CARE_PROVIDER_SITE_OTHER): Payer: Commercial Managed Care - HMO

## 2016-04-03 ENCOUNTER — Encounter: Payer: Self-pay | Admitting: Family Medicine

## 2016-04-03 ENCOUNTER — Ambulatory Visit (INDEPENDENT_AMBULATORY_CARE_PROVIDER_SITE_OTHER): Payer: Commercial Managed Care - HMO | Admitting: Family Medicine

## 2016-04-03 VITALS — BP 104/78 | HR 80 | Wt 113.0 lb

## 2016-04-03 DIAGNOSIS — M2550 Pain in unspecified joint: Secondary | ICD-10-CM

## 2016-04-03 LAB — CBC WITH DIFFERENTIAL/PLATELET
BASOS PCT: 0.5 % (ref 0.0–3.0)
Basophils Absolute: 0.1 10*3/uL (ref 0.0–0.1)
EOS PCT: 1.4 % (ref 0.0–5.0)
Eosinophils Absolute: 0.2 10*3/uL (ref 0.0–0.7)
HCT: 42.2 % (ref 36.0–46.0)
Hemoglobin: 14.6 g/dL (ref 12.0–15.0)
LYMPHS ABS: 2.8 10*3/uL (ref 0.7–4.0)
Lymphocytes Relative: 26 % (ref 12.0–46.0)
MCHC: 34.5 g/dL (ref 30.0–36.0)
MCV: 91.8 fl (ref 78.0–100.0)
MONO ABS: 0.7 10*3/uL (ref 0.1–1.0)
MONOS PCT: 6 % (ref 3.0–12.0)
NEUTROS PCT: 66.1 % (ref 43.0–77.0)
Neutro Abs: 7.1 10*3/uL (ref 1.4–7.7)
Platelets: 207 10*3/uL (ref 150.0–400.0)
RBC: 4.6 Mil/uL (ref 3.87–5.11)
RDW: 13.5 % (ref 11.5–15.5)
WBC: 10.8 10*3/uL — ABNORMAL HIGH (ref 4.0–10.5)

## 2016-04-03 LAB — CK: Total CK: 26 U/L (ref 7–177)

## 2016-04-03 LAB — SEDIMENTATION RATE: Sed Rate: 16 mm/hr (ref 0–30)

## 2016-04-03 LAB — C-REACTIVE PROTEIN

## 2016-04-03 MED ORDER — VITAMIN D (ERGOCALCIFEROL) 1.25 MG (50000 UNIT) PO CAPS: 50000.0000 [IU] | ORAL_CAPSULE | ORAL | 0 refills | Status: DC

## 2016-04-03 NOTE — Assessment & Plan Note (Signed)
Patient is having significant amount of muscle pain. Patient recently had thyroid testing which was within normal limits. Patient has had low vitamin D previously. Given once weekly vitamin D at this time. We discussed the different labs ruling out polymyalgia rheumatica could be beneficial. Patient is also on Lipitor and we'll check CK levels. Patient has not been eating well and we did discuss the possibility of x-rays to further evaluate with patient's history of basal cell carcinoma with patient declined. We discussed icing regimen. Discussed that I would like to see her again in 3 weeks. At that time if one area and seems to be continuing to hurt then we'll consider injection. Patient will monitor blood sugars. Come back again in 3 weeks.

## 2016-04-03 NOTE — Patient Instructions (Addendum)
Good to see you again.  Ice 20 minutes 2 times daily. Usually after activity and before bed. pennsaid pinkie amount topically 2 times daily as needed.  If too easy bruising then stop  I would decrease your lipitor to 3 times a week for the next week and see if that helps some of the pain.  Increase protein to 50-75 grams daily to help with gaining weight and keep the muscle you have Stop the daily vitamin D Start once weekly vitamin D See me again in 3 weeks and if there is one joint still giving you trouble then we will do injection for the cruise.

## 2016-04-04 ENCOUNTER — Telehealth: Payer: Self-pay | Admitting: Internal Medicine

## 2016-04-04 NOTE — Progress Notes (Signed)
Overall labs look great.

## 2016-04-04 NOTE — Telephone Encounter (Signed)
Pt saw Dr. Tamala Julian and she is concern because he told her to stop taking Caltrate D plus but to take regular Vit D instead and to cut back Lipitor 3x a week. Please call pt back, want to check and make sur this will be okey.

## 2016-04-04 NOTE — Telephone Encounter (Signed)
Please advise 

## 2016-04-05 NOTE — Telephone Encounter (Signed)
Ok to follow his instructions and she can re-evaluated when she sees him or me.

## 2016-04-05 NOTE — Telephone Encounter (Signed)
Spoke with pt, follow-up scheduled with dr burns.

## 2016-04-12 ENCOUNTER — Telehealth: Payer: Self-pay | Admitting: Emergency Medicine

## 2016-04-12 NOTE — Telephone Encounter (Signed)
done

## 2016-04-12 NOTE — Telephone Encounter (Signed)
Pt needs a [employer] referral to Dermatology. She has an appt tomorrow 9/7 at 3:20 at [facility]. Please follow up thanks.

## 2016-04-13 DIAGNOSIS — D225 Melanocytic nevi of trunk: Secondary | ICD-10-CM | POA: Diagnosis not present

## 2016-04-13 DIAGNOSIS — L720 Epidermal cyst: Secondary | ICD-10-CM | POA: Diagnosis not present

## 2016-04-13 DIAGNOSIS — Z85828 Personal history of other malignant neoplasm of skin: Secondary | ICD-10-CM | POA: Diagnosis not present

## 2016-04-14 ENCOUNTER — Other Ambulatory Visit: Payer: Self-pay | Admitting: Internal Medicine

## 2016-04-14 ENCOUNTER — Other Ambulatory Visit: Payer: Self-pay | Admitting: "Endocrinology

## 2016-04-14 DIAGNOSIS — E063 Autoimmune thyroiditis: Secondary | ICD-10-CM

## 2016-04-25 NOTE — Progress Notes (Signed)
[name] [address] [name], [place] [address] Phone: [phone] Subjective:    I'm seeing this patient by the request  of:  Binnie Rail, MD   CC: Bilateral hip and bilateral shoulder pain. Follow-up   [id number]  Stacy [name] is a 75 y.o. female coming in with complaint of left hip pain. Patient was having worsening pain of the bilateral hips as well as shoulders at last exam. Workup did not show any significant increase in inflammation. Patient was to do over-the-counter medications, icing regimen, home exercises. Patient was to start once weekly vitamin D and decrease patient's Lipitor. Patient is going out of the country on a cruise. Patient states not a significant improvement at this time. Continues to have pain. Concerned and she is not to be able to carry her own bag secondary to this discomfort. States that the hip pain overall seems to be doing a little bit better though.     Past Medical History:  Diagnosis Date  . Abscess of liver(572.0)   . Atrial fibrillation (HCC)    chronic anticoag - pradaxa  . BIPOLAR AFFECTIVE DISORDER   . Cancer ([place])    basal cell on abdomen  . Chronic bipolar disorder ([place])   . Complete AV block ([place])    s/p PPM--MEDTRONIC ADAPTA ADDr01  . COPD (chronic obstructive pulmonary disease) (HCC)    TOBACCO ABUSE  . Coronary artery disease    s/p PTCA  . DEPRESSION   . DIABETES MELLITUS, TYPE II dx 04/2010  . DYSLIPIDEMIA   . GERD (gastroesophageal reflux disease)   . HYPERTENSION   . HYPOTHYROIDISM    hashimoto's  . Pacemaker-Medtronic-dual-chamber   . Parathyroid related hypercalcemia ([place])   . Primary hyperparathyroidism ([place])   . Takotsubo syndrome   . TOBACCO ABUSE   . Vitamin D deficiency disease    Past Surgical History:  Procedure Laterality Date  . ABDOMINAL HYSTERECTOMY    . CARDIAC CATHETERIZATION    . EP IMPLANTABLE DEVICE N/A 08/05/2015   Procedure:  PPM Generator Changeout;   Surgeon: Deboraha Sprang, MD;  Location: [facility];  Service: Cardiovascular;  Laterality: N/A;  . INSERT / REPLACE / REMOVE PACEMAKER     MEDTRONIC ADAPTA ADDr01  . MASS EXCISION Left 01/27/2013   Procedure: EXCISION MASS LEFT FLANK;  Surgeon: Earnstine Regal, MD;  Location: [facility];  Service: General;  Laterality: Left;  . PARATHYROIDECTOMY     RIGHT INFERIOR   Social History  Substance Use Topics  . Smoking status: Current Every Day Smoker    Packs/day: 0.50    Years: 48.00    Types: Cigarettes  . Smokeless tobacco: Never Used     Comment: switched to vapor cigs. Widowed- 2 girls. Lives with daughter (who is nurse at [employer])  . Alcohol use No     Comment: "once in a blue moon" when I have [place] food   Allergies  Allergen Reactions  . Codeine Nausea And Vomiting  . Latex Other (See Comments)    irritation  . Metronidazole Other (See Comments)    Pt does not recall reaction  . Other Rash    Bandaids cause rash  . Penicillins Swelling and Rash    Throat swelling Has patient had a PCN reaction causing immediate rash, facial/tongue/throat swelling, SOB or lightheadedness with hypotension: Yes Has patient had a PCN reaction causing severe rash involving mucus membranes or skin necrosis: No Has patient  had a PCN reaction that required hospitalization No Has patient had a PCN reaction occurring within the last 10 years: Yes If all of the above answers are "NO", then may proceed with Cephalosporin use.   Family History  Problem Relation Age of Onset  . CAD Mother   . Colon cancer      FH  . Diabetes Maternal Aunt   . CAD Father   . Diabetes type II Sister      Past medical history, social, surgical and family history all reviewed in electronic medical record.   Review of Systems: No headache, visual changes, nausea, vomiting, diarrhea, constipation, dizziness, abdominal pain, skin rash, fevers, chills, night sweats, weight loss, swollen lymph nodes, chest pain,  shortness of breath, mood changes.   Objective  Blood pressure 124/72, pulse 87, weight 115 lb (52.2 kg), SpO2 92 %.  General: No apparent distress alert and oriented x3 mood and affect normal, dressed appropriately.  HEENT: Pupils equal, extraocular movements intact  Respiratory: Patient's speak in full sentences and does not appear short of breath  Cardiovascular: No lower extremity edema, non tender, no erythema  Skin: Warm dry intact with no signs of infection or rash on extremities or on axial skeleton.  Abdomen: Soft nontender  Neuro: Cranial nerves II through XII are intact, neurovascularly intact in all extremities with 2+ DTRs and 2+ pulses.  Lymph: No lymphadenopathy of posterior or anterior cervical chain or axillae bilaterally.  Gait normal with good balance and coordination.  MSK:  Non tender with full range of motion and good stability and symmetric strength and tone of  elbows, wrist,  knee and ankles bilaterally. Arthritic changes of multiple joints Hip: Bilateral ROM IR: 15 Deg, ER: 35 Deg, Flexion: 120 Deg, Extension: 100 Deg, Abduction: 45 Deg, Adduction: 25 Deg Strength IR: 4/5, ER: 4/5, Flexion: 5/5, Extension: 4/5, Abduction: 3+/5, Adduction: 5/5 Pelvic alignment unremarkable to inspection and palpation. Standing hip rotation and gait without trendelenburg sign / unsteadiness. nontender on exam today over the greater tendon tear Carrie. Tenderness over the piriformis still noted but less than previous exam Positive Corky Sox on left side Mild discomfort over the sacroiliac joints bilaterally still present  Shoulder: Bilateral Inspection reveals no abnormalities, atrophy or asymmetry. Mild tenderness on palpation generalized around the shoulder girdle Continues to have mild limitation in all planes with range of motion testing.. Rotator cuff strength normal throughout. Mild impingement signs noted Speeds and Yergason's tests normal. No labral pathology noted with  negative Obrien's, negative clunk and good stability. No apprehension sign Mild worsening from previous exam  Procedure: Real-time Ultrasound Guided Injection of right glenohumeral joint Device: GE Logiq E  Ultrasound guided injection is preferred based studies that show increased duration, increased effect, greater accuracy, decreased procedural pain, increased response rate with ultrasound guided versus blind injection.  Verbal informed consent obtained.  Time-out conducted.  Noted no overlying erythema, induration, or other signs of local infection.  Skin prepped in a sterile fashion.  Local anesthesia: Topical Ethyl chloride.  With sterile technique and under real time ultrasound guidance:  Joint visualized.  23g 1  inch needle inserted posterior approach. Pictures taken for needle placement. Patient did have injection of 2 cc of 1% lidocaine, 2 cc of 0.5% Marcaine, and 1.0 cc of Kenalog 40 mg/dL. Completed without difficulty  Pain immediately resolved suggesting accurate placement of the medication.  Advised to call if fevers/chills, erythema, induration, drainage, or persistent bleeding.  Images permanently stored and available for review in  the ultrasound unit.  Impression: Technically successful ultrasound guided injection.  Procedure: Real-time Ultrasound Guided Injection of left glenohumeral joint Device: GE Logiq E  Ultrasound guided injection is preferred based studies that show increased duration, increased effect, greater accuracy, decreased procedural pain, increased response rate with ultrasound guided versus blind injection.  Verbal informed consent obtained.  Time-out conducted.  Noted no overlying erythema, induration, or other signs of local infection.  Skin prepped in a sterile fashion.  Local anesthesia: Topical Ethyl chloride.  With sterile technique and under real time ultrasound guidance:  Joint visualized.  23g 1  inch needle inserted posterior approach.  Pictures taken for needle placement. Patient did have injection of 2 cc of 1% lidocaine, 2 cc of 0.5% Marcaine, and 1cc of Kenalog 40 mg/dL. Completed without difficulty  Pain immediately resolved suggesting accurate placement of the medication.  Advised to call if fevers/chills, erythema, induration, drainage, or persistent bleeding.  Images permanently stored and available for review in the ultrasound unit.  Impression: Technically successful ultrasound guided injection.        Impression and Recommendations:     This case required medical decision making of moderate complexity.

## 2016-04-26 ENCOUNTER — Encounter: Payer: Self-pay | Admitting: Family Medicine

## 2016-04-26 ENCOUNTER — Ambulatory Visit (INDEPENDENT_AMBULATORY_CARE_PROVIDER_SITE_OTHER): Payer: Commercial Managed Care - HMO | Admitting: Family Medicine

## 2016-04-26 ENCOUNTER — Other Ambulatory Visit: Payer: Self-pay

## 2016-04-26 VITALS — BP 124/72 | HR 87 | Wt 115.0 lb

## 2016-04-26 DIAGNOSIS — E559 Vitamin D deficiency, unspecified: Secondary | ICD-10-CM

## 2016-04-26 DIAGNOSIS — M25511 Pain in right shoulder: Secondary | ICD-10-CM | POA: Diagnosis not present

## 2016-04-26 DIAGNOSIS — M7062 Trochanteric bursitis, left hip: Secondary | ICD-10-CM

## 2016-04-26 DIAGNOSIS — M25512 Pain in left shoulder: Secondary | ICD-10-CM | POA: Diagnosis not present

## 2016-04-26 MED ORDER — VITAMIN D (ERGOCALCIFEROL) 1.25 MG (50000 UNIT) PO CAPS: 50000.0000 [IU] | ORAL_CAPSULE | ORAL | 0 refills | Status: DC

## 2016-04-26 NOTE — Patient Instructions (Addendum)
Good to see you  Injected both shoulders today  Exercises 3 times a week.  Once weekly vitamin D for next 12 weeks can help muscle strength and endurance which will help the pain overall on the joints For joint pain the vitamin D should help and can consider 1200mg  of glucosamine but only proven to slow progression of arthritis.  Ice is still a good idea.  pennsaid pinkie amount topically 2 times daily as needed.  I wanted you to avoid excessive calcium with you on the lithium  See me again in 6 weeks if still in pain and will need to consider other tests or physical therapy.  Marland Kitchen

## 2016-04-26 NOTE — Assessment & Plan Note (Signed)
Patient given bilateral injections today. We discussed topical anti-inflammatories and 48 oral anti-inflammatories with patient being on a blood thinner. We discussed icing regimen. Discussed which activities doing which was potentially avoid. Patient did have near complete improvement after the injections which makes me hopeful. I do think that there is some underlying arthritic changes that are likely contributing. Patient has declined formal physical therapy. Patient come back in 4 weeks and if worsening symptoms we'll consider further evaluation of patient's cervical neck as well as physical therapy. Workup for polymyalgia rheumatica unremarkable.

## 2016-04-26 NOTE — Assessment & Plan Note (Signed)
Encourage supplementation 

## 2016-04-26 NOTE — Assessment & Plan Note (Signed)
Seems stable and we'll continue to monitor.

## 2016-04-27 ENCOUNTER — Telehealth: Payer: Self-pay | Admitting: Internal Medicine

## 2016-04-27 ENCOUNTER — Encounter: Payer: Self-pay | Admitting: Student

## 2016-04-27 NOTE — Telephone Encounter (Signed)
The dramamine may interact with the Vesicare that she is on. All of the motion sickness medications have the same side effects. It should be ok if she uses it only as needed and not often unless she changes her vesicare usage.

## 2016-04-27 NOTE — Telephone Encounter (Signed)
Pt called in and is going on a cruise.  She wants to know if she can day the dramamine with all the other meds that she is one?

## 2016-04-27 NOTE — Telephone Encounter (Signed)
LVM letting pt know.  

## 2016-04-27 NOTE — Telephone Encounter (Signed)
Please advise 

## 2016-05-18 DIAGNOSIS — F3181 Bipolar II disorder: Secondary | ICD-10-CM | POA: Diagnosis not present

## 2016-05-26 ENCOUNTER — Telehealth (INDEPENDENT_AMBULATORY_CARE_PROVIDER_SITE_OTHER): Payer: Self-pay | Admitting: "Endocrinology

## 2016-05-26 ENCOUNTER — Telehealth: Payer: Self-pay | Admitting: Family Medicine

## 2016-05-26 NOTE — Telephone Encounter (Signed)
Hold calcium-vit d while taking the prescription vitamin d then restart it after she finishes the prescription D

## 2016-05-26 NOTE — Telephone Encounter (Signed)
LVM informing pt

## 2016-05-26 NOTE — Telephone Encounter (Signed)
States that Dr. Tamala Julian prescribed her a vit D pill.  State that patient also takes a calcium Vit D.  Would like to know if patient needs to stop the calcium Vit D.  If so would need to know what to do in regard to calcium.  Also, states that patient is taking a multi vit.

## 2016-05-26 NOTE — Telephone Encounter (Signed)
Please advise 

## 2016-05-26 NOTE — Telephone Encounter (Signed)
Routed to provider

## 2016-05-26 NOTE — Telephone Encounter (Signed)
The patient noticed today that her Metformin rx is extended release. She takes this twice a day. She would like to make sure that they are supposed to be extended release. If she does not answer, please leave a detailed message for her.

## 2016-05-29 ENCOUNTER — Ambulatory Visit (INDEPENDENT_AMBULATORY_CARE_PROVIDER_SITE_OTHER): Payer: Commercial Managed Care - HMO | Admitting: *Deleted

## 2016-05-29 DIAGNOSIS — I442 Atrioventricular block, complete: Secondary | ICD-10-CM | POA: Diagnosis not present

## 2016-05-29 DIAGNOSIS — H04123 Dry eye syndrome of bilateral lacrimal glands: Secondary | ICD-10-CM | POA: Diagnosis not present

## 2016-05-29 DIAGNOSIS — H5213 Myopia, bilateral: Secondary | ICD-10-CM | POA: Diagnosis not present

## 2016-05-29 LAB — HM DIABETES EYE EXAM

## 2016-05-29 NOTE — Progress Notes (Signed)
Remote pacemaker transmission.   

## 2016-05-30 ENCOUNTER — Encounter: Payer: Self-pay | Admitting: Cardiology

## 2016-05-30 NOTE — Telephone Encounter (Signed)
Routed to provider

## 2016-05-30 NOTE — Telephone Encounter (Signed)
Patient noticed that her prescription for metformin had been prescribed twice a day and is extended release. She wants to confirm that this is what is correct and not an error.

## 2016-06-06 ENCOUNTER — Telehealth: Payer: Self-pay | Admitting: Cardiology

## 2016-06-06 NOTE — Telephone Encounter (Signed)
-----   Message from [place] sent at 06/06/2016 10:13 AM EDT ----- Contact: [place],   Patient left VM on my phone asking for a call back please- she was needing a card that shows what kind of pacemaker she has.   Thanks, Maudie Mercury

## 2016-06-06 NOTE — Telephone Encounter (Signed)
LMOVM for pt to return call 

## 2016-06-07 ENCOUNTER — Ambulatory Visit: Payer: Commercial Managed Care - HMO | Admitting: Family Medicine

## 2016-06-08 NOTE — Telephone Encounter (Signed)
See 2nd note.

## 2016-06-09 ENCOUNTER — Telehealth: Payer: Self-pay | Admitting: Family Medicine

## 2016-06-09 ENCOUNTER — Telehealth: Payer: Self-pay | Admitting: Internal Medicine

## 2016-06-09 NOTE — Telephone Encounter (Signed)
Patient states her script for Vit D is almost out.  Would like to know if she needs another script or if she needs to start on caltrate.

## 2016-06-09 NOTE — Telephone Encounter (Signed)
Please note in patient chart that she had flu shot and pneumonia shot today 06/09/16 at [facility] on [address].

## 2016-06-12 MED ORDER — VITAMIN D (ERGOCALCIFEROL) 1.25 MG (50000 UNIT) PO CAPS: 50000.0000 [IU] | ORAL_CAPSULE | ORAL | 0 refills | Status: DC

## 2016-06-12 NOTE — Telephone Encounter (Signed)
Spoke with pt to clarify which pneumo vac was given, chart updated.

## 2016-06-12 NOTE — Telephone Encounter (Signed)
rx sent into pharmacy. Pt made aware.  

## 2016-06-13 ENCOUNTER — Encounter: Payer: Self-pay | Admitting: Cardiology

## 2016-06-13 NOTE — Telephone Encounter (Signed)
Spoke w/ pt and she informed me that she found her device id card.

## 2016-06-15 DIAGNOSIS — F3181 Bipolar II disorder: Secondary | ICD-10-CM | POA: Diagnosis not present

## 2016-06-27 ENCOUNTER — Telehealth (INDEPENDENT_AMBULATORY_CARE_PROVIDER_SITE_OTHER): Payer: Self-pay | Admitting: "Endocrinology

## 2016-06-27 NOTE — Telephone Encounter (Signed)
1. I called the patient to check up on her.  2. Subjective: She had been taking metformin XL twice daily, but when her BGs came down to the 90s, she decided to take it only once a day. She says that her BGs are mostly in the 106 range now.  3. Assessment: By history she is dong well on one dose of metformin XL per day. I reminded her, however, that over the holidays she may want to resume the twice daily dosing to compensate for the extra carbs that she will be taking in. She agreed. 4. Plan: Patient will come in for a follow up appointment in December as planned. We will download her BG meter and check her HbA1c at that time.   Sherrlyn Hock, MD, CDE Adult and Pediatric Endocrinology

## 2016-06-28 LAB — CUP PACEART REMOTE DEVICE CHECK
Battery Impedance: 100 Ohm
Brady Statistic AP VP Percent: 6 %
Brady Statistic AP VS Percent: 44 %
Brady Statistic AS VP Percent: 1 %
Brady Statistic AS VS Percent: 49 %
Date Time Interrogation Session: 20171023140138
Implantable Lead Implant Date: 20010521
Implantable Lead Location: 753860
Implantable Lead Model: 4592
Implantable Lead Model: 6940
Lead Channel Impedance Value: 1237 Ohm
Lead Channel Pacing Threshold Amplitude: 0.625 V
Lead Channel Pacing Threshold Pulse Width: 0.4 ms
Lead Channel Sensing Intrinsic Amplitude: 11.2 mV
Lead Channel Sensing Intrinsic Amplitude: 2.8 mV
MDC IDC LEAD IMPLANT DT: 20010521
MDC IDC LEAD LOCATION: 753859
MDC IDC MSMT BATTERY REMAINING LONGEVITY: 156 mo
MDC IDC MSMT BATTERY VOLTAGE: 2.8 V
MDC IDC MSMT LEADCHNL RA IMPEDANCE VALUE: 619 Ohm
MDC IDC MSMT LEADCHNL RV PACING THRESHOLD AMPLITUDE: 1.25 V
MDC IDC MSMT LEADCHNL RV PACING THRESHOLD PULSEWIDTH: 0.4 ms
MDC IDC PG IMPLANT DT: 20161229
MDC IDC SET LEADCHNL RA PACING AMPLITUDE: 2 V
MDC IDC SET LEADCHNL RV PACING AMPLITUDE: 2.5 V
MDC IDC SET LEADCHNL RV PACING PULSEWIDTH: 0.4 ms
MDC IDC SET LEADCHNL RV SENSING SENSITIVITY: 4 mV

## 2016-07-03 ENCOUNTER — Telehealth: Payer: Self-pay

## 2016-07-03 NOTE — Telephone Encounter (Signed)
noted 

## 2016-07-03 NOTE — Telephone Encounter (Signed)
Woman called from [facility]. She was unclear whom to speak to. She informed that the pt was referred to Dr. Jacques Earthly for a sleep study and that Dr. Mellody Memos office put in the referral for Neurology. Woman wanted [place] to do the authorization for Neurology but I told the woman that we do not do authorizations for other offices and we would do the authorization if we placed the referral. I asked if the information that I just gave made sense. She stated no and hung up.

## 2016-07-05 ENCOUNTER — Encounter: Payer: Self-pay | Admitting: Neurology

## 2016-07-05 ENCOUNTER — Ambulatory Visit (INDEPENDENT_AMBULATORY_CARE_PROVIDER_SITE_OTHER): Payer: Commercial Managed Care - HMO | Admitting: Neurology

## 2016-07-05 VITALS — BP 110/68 | HR 78 | Resp 14 | Ht 64.0 in | Wt 115.0 lb

## 2016-07-05 DIAGNOSIS — I442 Atrioventricular block, complete: Secondary | ICD-10-CM

## 2016-07-05 DIAGNOSIS — R0683 Snoring: Secondary | ICD-10-CM

## 2016-07-05 DIAGNOSIS — I482 Chronic atrial fibrillation, unspecified: Secondary | ICD-10-CM

## 2016-07-05 DIAGNOSIS — R5383 Other fatigue: Secondary | ICD-10-CM | POA: Diagnosis not present

## 2016-07-05 DIAGNOSIS — R351 Nocturia: Secondary | ICD-10-CM | POA: Diagnosis not present

## 2016-07-05 NOTE — Patient Instructions (Signed)

## 2016-07-05 NOTE — Progress Notes (Signed)
Subjective:    Stacy [name] ID: Stacy Stacy [name] is a 75 y.o. female.  HPI     Star Age, MD, PhD [facility] [address], [address] [place], [place] [address]  Dear Stacy Stacy [name],   I saw your Stacy [name], Stacy Stacy [name], upon your kind request, in my neurologic clinic today for initial consultation of Stacy Stacy [name] sleep disorder, in particular, concern for underlying obstructive sleep apnea. Stacy Stacy [name] is unaccompanied today. As you know, Stacy Stacy [name] is a 75 year old right-handed woman with a complex medical history of hyperlipidemia, reflux disease, hypertension, hypothyroidism, complete heart block, status post pacemaker placement, A. fib, history of liver abscess, bipolar disorder, basal cell skin cancer, COPD with ongoing smoking, heart disease, status post PTCA, and vitamin D deficiency, who reports snoring and nonrestorative sleep, difficulty falling asleep and maintaining sleep. I reviewed your office records from 08/24/2015 as well as 06/15/2016, 05/18/2016, 02/24/2016, 12/30/2015, 11/22/2015 and 08/21/2015. Stacy Stacy [name] Epworth sleepiness score is 5 out of 24 today, Stacy Stacy [name] fatigue score is 10 out of 63. Stacy Stacy [name] has been on Li for years for Stacy Stacy [name] bipolar, currently on 300 mg each night and Stacy Stacy [name] is on low dose generic Prozac 10 mg. Stacy [name] smokes 1 ppd, no alcohol, limited caffeine, 1/2 and 1/2, about 2 cups, tries to drink Smart water Stacy rest of Stacy day. Stacy Stacy [name] tries to be in bed by 9 PM. Stacy Stacy [name] likes to watch TV in bed but TV shuts off after about 2 hours. Stacy Stacy [name] does not turn Stacy TV back on in Stacy middle of Stacy night. Stacy Stacy [name] wakes up between 6 and 7 usually. Stacy Stacy [name] has 2 grown daughters, one daughter lives with Stacy Stacy [name]. 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 Stacy [name] denies morning headaches but has nocturia about twice per hour tonight. Stacy Stacy [name] denies any frank restless leg symptoms but has occasional nighttime leg cramps. Stacy Stacy [name]  is widowed, Stacy Stacy [name] is retired. Snoring can be loud according to daughter and sister. Stacy Stacy [name] is not aware of any family history of obstructive sleep apnea. Mood disorder is under good control currently. Stacy Stacy [name] regularly follows with cardiology and sees Dr. Caryl Comes, last visit in March 2017 and I reviewed Stacy note.  Stacy Stacy [name] Past Medical History Is Significant For: Past Medical History:  Diagnosis Date  . Abscess of liver(572.0)   . Atrial fibrillation (HCC)    chronic anticoag - pradaxa  . BIPOLAR AFFECTIVE DISORDER   . Cancer ([place])    basal cell on abdomen  . Chronic bipolar disorder ([place])   . Complete AV block ([place])    s/p PPM--[employer]TR[employer] ADAPTA ADDr01  . COPD (chronic obstructive pulmonary disease) (HCC)    TOBACCO ABUSE  . Coronary artery disease    s/p PTCA  . DEPRESSION   . DIABETES MELLITUS, TYPE II dx 04/2010  . DYSLIPIDEMIA   . GERD (gastroesophageal reflux disease)   . HYPERTENSION   . HYPOTHYROIDISM    hashimoto's  . Pacemaker-Medtronic-dual-chamber   . Parathyroid related hypercalcemia ([place])   . Primary hyperparathyroidism ([place])   . Takotsubo syndrome   . TOBACCO ABUSE   . Vitamin D deficiency disease     Stacy Stacy [name] Past Surgical History Is Significant For: Past Surgical History:  Procedure Laterality Date  . ABDOMINAL HYSTERECTOMY    . CARDIAC CATHETERIZATION    . EP IMPLANTABLE DEVICE N/A 08/05/2015   Procedure:  PPM Generator Changeout;  Surgeon: Deboraha Sprang, MD;  Location: [place]  [facility];  Service: Cardiovascular;  Laterality: N/A;  . INSERT / REPLACE / REMOVE PACEMAKER     MEDTR[employer]IC ADAPTA ADDr01  . MASS EXCISION Left 01/27/2013   Procedure: EXCISION MASS LEFT FLANK;  Surgeon: Earnstine Regal, MD;  Location: [place];  Service: General;  Laterality: Left;  . PARATHYROIDECTOMY     RIGHT INFERIOR    Stacy Stacy [name] Family History Is Significant For: Family History  Problem Relation Age of Onset  . CAD Mother   . CAD Father   . Colon cancer      FH  . Diabetes Maternal Aunt   .  Diabetes type II Sister     Stacy Stacy [name] Social History Is Significant For: Social History   Social History  . Marital status: Widowed    Spouse name: N/A  . Number of children: 2  . Years of education: 12   Occupational History  .  Retired   Social History Main Topics  . Smoking status: Current Every Day Smoker    Packs/day: 1.00    Years: 48.00    Types: Cigarettes  . Smokeless tobacco: Never Used     Comment: switched to vapor cigs. Widowed- 2 girls. Lives with daughter (who is nurse at [employer])  . Alcohol use No     Comment: "once in a blue moon" when I have [place] food  . Drug use: No  . Sexual activity: No   Other Topics Concern  . None   Social History Narrative   WIDOWED   2 DAUGHTERS   LIVES W/DAUGHTER   CURRENTLY SMOKES   NO ALCOHOL USE   NO ILLICIT DRUG USE   DAILY CAFFEINE USE, 2 per day       PPM-MEDTRONIC   Stacy [name] SIGNED A DESIGNATED PARTY RELEASE TO ALLOW DAUGHTER, NICOLE COX, TO HAVE ACCESS TO Stacy Stacy [name] MEDICAL RECORDS/INFORMATION. Fleet Contras, November 09, 2009 9:19 AM    Stacy Stacy [name] Allergies Are:  Allergies  Allergen Reactions  . Codeine Nausea And Vomiting  . Latex Other (See Comments)    irritation  . Metronidazole Other (See Comments)    Pt does not recall reaction  . Other Rash    Bandaids cause rash  . Penicillins Swelling and Rash    Throat swelling Has Stacy [name] had a PCN reaction causing immediate rash, facial/tongue/throat swelling, SOB or lightheadedness with hypotension: Yes Has Stacy [name] had a PCN reaction causing severe rash involving mucus membranes or skin necrosis: No Has Stacy [name] had a PCN reaction that required hospitalization No Has Stacy [name] had a PCN reaction occurring within Stacy last 10 years: Yes If all of Stacy above answers are "NO", then may proceed with Cephalosporin use.  :   Stacy Stacy [name] Current Medications Are:  Outpatient Encounter Prescriptions as of 07/05/2016  Medication Sig  . atorvastatin (LIPITOR) 80 MG tablet TAKE 1 TABLET EVERY DAY   AT  6:00PM  . donepezil (ARICEPT) 10 MG tablet Take 1 tablet (10 mg total) by mouth at bedtime.  Marland Kitchen FLUoxetine (PROZAC) 10 MG capsule Take 10 mg by mouth daily.  Marland Kitchen lithium carbonate 300 MG capsule   . loratadine (CLARITIN) 10 MG tablet TAKE 1 TABLET EVERY DAY  . losartan (COZAAR) 50 MG tablet Take 1 tablet (50 mg total) by mouth daily.  . metFORMIN (GLUCOPHAGE-XR) 500 MG 24 hr tablet TAKE 1 TABLET EVERY DAY WITH BREAKFAST  . Multiple Vitamin (MULTIVITAMIN WITH MINERALS) TABS tablet Take 1 tablet by mouth at bedtime.  Marland Kitchen omeprazole (PRILOSEC) 40 MG capsule TAKE 1  CAPSULE EVERY DAY  BEFORE  BREAKFAST (Stacy [name] taking differently: Take 40 mg by mouth daily before breakfast. )  . PRADAXA 150 MG CAPS capsule TAKE 1 CAPSULE TWICE DAILY  . SYNTHROID 112 MCG tablet TAKE 1 TABLET EVERY DAY  . VESICARE 5 MG tablet TAKE 1 TABLET EVERY DAY  . VESICARE 5 MG tablet TAKE 1 TABLET EVERY DAY  . [DISCONTINUED] lithium carbonate 150 MG capsule Take 150 mg by mouth at bedtime.  . [DISCONTINUED] ACCU-CHEK AVIVA PLUS test strip CHECK BLOOD SUGAR ONE TIME DAILY  AND AS NEEDED  . [DISCONTINUED] Calcium Carbonate-Vitamin D (CALCIUM-D PO) Take 1 tablet by mouth at bedtime. Calcium 630 mg, vitamin D 500 I.U.  . [DISCONTINUED] desoximetasone (TOPICORT) 0.05 % cream Apply 1 application topically 2 (two) times daily as needed (itching). Reported on 11/01/2015  . [DISCONTINUED] ferrous sulfate 325 (65 FE) MG tablet Take 325 mg by mouth daily with breakfast. Sunday, Wednesday, Friday  . [DISCONTINUED] Melatonin 5 MG CAPS Take 5 mg by mouth at bedtime.  . [DISCONTINUED] Propylene Glycol (SYSTANE BALANCE OP) Place 1 drop into both eyes 3 (three) times daily as needed (dry eyes).   . [DISCONTINUED] Vitamin D, Ergocalciferol, (DRISDOL) 50000 units CAPS capsule Take 1 capsule (50,000 Units total) by mouth every 7 (seven) days.   No facility-administered encounter medications on file as of 07/05/2016.   :  Review of Systems:  Out  of a complete 14 point review of systems, all are reviewed and negative with Stacy exception of these symptoms as listed below: Review of Systems  Neurological:       Stacy [name] has trouble falling and staying asleep, snoring, sometimes wakes up feeling tired, daytime fatigue, denies taking naps.   Epworth Sleepiness Scale 0= would never doze 1= slight chance of dozing 2= moderate chance of dozing 3= high chance of dozing  Sitting and reading:0 Watching TV:2 Sitting inactive in a public place (ex. Theater or meeting):0 As a passenger in a car for an hour without a break:0 Lying down to rest in Stacy afternoon:2 Sitting and talking to someone:0 Sitting quietly after lunch (no alcohol):1 In a car, while stopped in traffic:0 Total:5   Objective:  Neurologic Exam  Physical Exam Physical Examination:   Vitals:   07/05/16 1428  BP: 110/68  Pulse: 78  Resp: 14   General Examination: Stacy Stacy [name] is a very pleasant 75 y.o. female in no acute distress. Stacy Stacy [name] appears well-developed and well-nourished and well groomed.   HEENT: Normocephalic, atraumatic, pupils are equal, round and reactive to light and accommodation. Funduscopic exam is normal with sharp disc margins noted. Extraocular tracking is good without limitation to gaze excursion or nystagmus noted. Normal smooth pursuit is noted. Hearing is grossly intact. Tympanic membranes are clear bilaterally. Face is symmetric with normal facial animation and normal facial sensation. Speech is clear with no dysarthria noted. There is no hypophonia. There is no lip, neck/head, jaw or voice tremor. Neck is supple with full range of passive and active motion. There are no carotid bruits on auscultation. Oropharynx exam reveals: mild mouth dryness, adequate dental hygiene and moderate airway crowding, due to redundant soft palate and wider uvula, tonsils are small or absent? Mallampati is class II. Tongue protrudes centrally and palate elevates  symmetrically. Neck size is 12.25 inches. Stacy Stacy [name] has a Mild overbite. Nasal inspection reveals no significant nasal mucosal bogginess or redness and no septal deviation.   Chest: Clear to auscultation without wheezing, rhonchi or crackles noted.  Heart: S1+S2+0,  regular and normal without murmurs, rubs or gallops noted.   Abdomen: Soft, non-tender and non-distended with normal bowel sounds appreciated on auscultation.  Extremities: There is no pitting edema in Stacy distal lower extremities bilaterally. Pedal pulses are intact.  Skin: Warm and dry without trophic changes noted. There are no varicose veins.  Musculoskeletal: exam reveals no obvious joint deformities, tenderness or joint swelling or erythema.   Neurologically:  Mental status: Stacy Stacy [name] is awake, alert and oriented in all 4 spheres. Stacy Stacy [name] immediate and remote memory, attention, language skills and fund of knowledge are appropriate. There is no evidence of aphasia, agnosia, apraxia or anomia. Speech is clear with normal prosody and enunciation. Thought process is linear. Mood is normal and affect is normal.  Cranial nerves II - XII are as described above under HEENT exam. In addition: shoulder shrug is normal with equal shoulder height noted. Motor exam: Normal bulk, strength and tone is noted. There is no drift, tremor or rebound. Romberg is negative. Reflexes are 2+ throughout. Babinski: Toes are flexor bilaterally. Fine motor skills and coordination: intact with normal finger taps, normal hand movements, normal rapid alternating patting, normal foot taps and normal foot agility.  Cerebellar testing: No dysmetria or intention tremor on finger to nose testing. Heel to shin is unremarkable bilaterally. There is no truncal or gait ataxia.  Sensory exam: intact to light touch, pinprick, vibration, temperature sense in Stacy upper and lower extremities.  Gait, station and balance: Stacy Stacy [name] stands easily. No veering to one side is noted. No leaning  to one side is noted. Posture is age-appropriate and stance is narrow based. Gait shows normal stride length and normal pace. No problems turning are noted. Tandem walk is unremarkable.     Assessment and Plan:  In summary, Stacy Stacy [name] is a very pleasant 75 y.o.-year old female with a complex medical history of hyperlipidemia, reflux disease, hypertension, hypothyroidism, complete heart block, status post pacemaker placement, A. fib, history of liver abscess, bipolar disorder, basal cell skin cancer, COPD with ongoing smoking, heart disease, status post PTCA, and vitamin D deficiency, whose history and physical exam are concerning for obstructive sleep apnea (OSA). I had a long chat with Stacy Stacy [name] about my findings and Stacy diagnosis of OSA, its prognosis and treatment options. We talked about medical treatments, surgical interventions and non-pharmacological approaches. I explained in particular Stacy risks and ramifications of untreated moderate to severe OSA, especially with respect to developing cardiovascular disease down Stacy Road, including congestive heart failure, difficult to treat hypertension, cardiac arrhythmias, or stroke. Even type 2 diabetes has, in part, been linked to untreated OSA. Symptoms of untreated OSA include daytime sleepiness, memory problems, mood irritability and mood disorder such as depression and anxiety, lack of energy, as well as recurrent headaches, especially morning headaches. We talked about smoking cessation and trying to maintain a healthy lifestyle in general, as well as Stacy importance of weight control. I encouraged Stacy Stacy [name] to eat healthy, exercise daily and keep well hydrated, to keep a scheduled bedtime and wake time routine, to not skip any meals and eat healthy snacks in between meals. I advised Stacy Stacy [name] not to drive when feeling sleepy. I recommended Stacy following at this time: sleep study with potential positive airway pressure titration. (We will score  hypopneas at 4% and split Stacy sleep study into diagnostic and treatment portion, if Stacy estimated. 2 hour AHI is >15/h).   I explained Stacy sleep test procedure to Stacy Stacy [name] and also outlined  possible surgical and non-surgical treatment options of OSA, including Stacy use of a custom-made dental device (which would require a referral to a specialist dentist or oral surgeon), upper airway surgical options, such as pillar implants, radiofrequency surgery, tongue base surgery, and UPPP (which would involve a referral to an ENT surgeon). Rarely, jaw surgery such as mandibular advancement may be considered.  I also explained Stacy CPAP treatment option to Stacy Stacy [name], who indicated that Stacy Stacy [name] would be willing to try CPAP if Stacy need arises. I explained Stacy importance of being compliant with PAP treatment, not only for insurance purposes but primarily to improve Stacy Stacy [name] symptoms, and for Stacy Stacy [name]'s long term health benefit, including to reduce Stacy Stacy [name] cardiovascular risks. I answered all Stacy Stacy [name] questions today and Stacy Stacy [name] was in agreement. I would like to see Stacy Stacy [name] back after Stacy sleep study is completed and encouraged Stacy Stacy [name] to call with any interim questions, concerns, problems or updates.   Thank you very much for allowing me to participate in Stacy care of this nice Stacy [name]. If I can be of any further assistance to you please do not hesitate to call me at [phone].  Sincerely,   Star Age, MD, PhD

## 2016-07-06 DIAGNOSIS — F319 Bipolar disorder, unspecified: Secondary | ICD-10-CM | POA: Diagnosis not present

## 2016-07-10 ENCOUNTER — Encounter (INDEPENDENT_AMBULATORY_CARE_PROVIDER_SITE_OTHER): Payer: Self-pay | Admitting: "Endocrinology

## 2016-07-10 ENCOUNTER — Ambulatory Visit (INDEPENDENT_AMBULATORY_CARE_PROVIDER_SITE_OTHER): Payer: Commercial Managed Care - HMO | Admitting: "Endocrinology

## 2016-07-10 VITALS — BP 124/76 | HR 86 | Wt 112.6 lb

## 2016-07-10 DIAGNOSIS — E038 Other specified hypothyroidism: Secondary | ICD-10-CM | POA: Diagnosis not present

## 2016-07-10 DIAGNOSIS — E559 Vitamin D deficiency, unspecified: Secondary | ICD-10-CM

## 2016-07-10 DIAGNOSIS — E049 Nontoxic goiter, unspecified: Secondary | ICD-10-CM

## 2016-07-10 DIAGNOSIS — R634 Abnormal weight loss: Secondary | ICD-10-CM | POA: Diagnosis not present

## 2016-07-10 DIAGNOSIS — R7303 Prediabetes: Secondary | ICD-10-CM

## 2016-07-10 DIAGNOSIS — F3162 Bipolar disorder, current episode mixed, moderate: Secondary | ICD-10-CM | POA: Diagnosis not present

## 2016-07-10 DIAGNOSIS — G250 Essential tremor: Secondary | ICD-10-CM

## 2016-07-10 DIAGNOSIS — E211 Secondary hyperparathyroidism, not elsewhere classified: Secondary | ICD-10-CM

## 2016-07-10 DIAGNOSIS — E063 Autoimmune thyroiditis: Secondary | ICD-10-CM

## 2016-07-10 DIAGNOSIS — I10 Essential (primary) hypertension: Secondary | ICD-10-CM | POA: Diagnosis not present

## 2016-07-10 LAB — POCT GLYCOSYLATED HEMOGLOBIN (HGB A1C): HEMOGLOBIN A1C: 6.1

## 2016-07-10 LAB — GLUCOSE, POCT (MANUAL RESULT ENTRY): POC GLUCOSE: 155 mg/dL — AB (ref 70–99)

## 2016-07-10 NOTE — Progress Notes (Signed)
Subjective:  Patient Name: Stacy [name] Date of Birth: 1940/11/29  MRN: [id number]  Stacy [name]  presents to the office today for follow-up of her T2DM, hypothyroidism, thyroiditis, hypertension, fatigue, secondary hyperparathyroidism, vitamin D deficiency, hyperlipidemia, tobacco abuse, and tremor.  HISTORY OF PRESENT ILLNESS:   Stacy [name] is a 75 y.o. Caucasian woman.  Stacy [name] was unaccompanied.  1. Stacy [name] was first referred to me on 09/04/05 for evaluation and management of chronic hypothyroidism and new hypercalcemia secondary to primary hyperparathyroidism.   A. She had developed hypothyroidism due to thyroiditis some 15 years before and was being treated with Synthroid. Since that first visit with me she has had some waxing and waning of her thyroid gland size which is consistent with inflammatory flare-ups of her Hashimoto's disease. We've increased her thyroid hormone dose slightly over time. She now takes 112 mcg of Synthroid daily. She has remained euthyroid most of the time.   B. She had also developed vitamin D deficiency, then secondary hyperparathyroidism, followed by tertiary hyperparathyroidism during the ten year period prior to seeing me for the first tim,. Her right inferior parathyroid gland was quite enlarged. She was taken to surgery by Dr. Armandina Gemma on 12/07/05 for parathyroidectomy. Since that time she's done well, provided that she has continued to take her calcium and vitamin D.    2. The patient has experienced many other problems during the last 10 years, to include bipolar disorder, GERD, fatigue, iron deficiency, 2 heart attacks, hypertension, orthostatic hypotension, atrial fibrillation, and the need for a cardiac pacemaker. We've helped to co-manage some of those problems. She was also diagnosed with type 2 diabetes in early 2012 and was started on metformin 500 mg once daily. She was also started on atorvastatin for hyperlipidemia.  3. The patient's last [facility]  visit was on 12/06/15.   A. In the interim, she has been generally healthy. Her arthritis in her shoulders, hips, and knees has been her major physical problem. She saw Dr. Tamala Julian in Sports Medicine, who injected steroids in her shoulders, giving her temporary relief. She has been walking more.   B. She became more depressed 4 months ago. Her psychiatric PA, Mr. Comer Locket, put her on Buspar and the depression resolved. She has resumed taking 300 mg of lithium per day. Her bipolar disease is doing well.   C.  She is still smoking.  D. Her appetite "has come back with a vengeance lately".    E. She is taking Synthroid, 112 mcg/day and metformin XL, 500 mg, once daily. She reduced her metformin XL to once per day when her BGs decreased to the 90s. She also takes atorvastatin, calcium carbonate three times per week, fluoxetine, donepezil, Pradaxa, omeprazole, lithium, losartan, and solifenacin. Dr. Tamala Julian reduced her calcium carbonate and put her on vitamin D once weekly until her prescription runs out. He told her that she should not take too much calcium while on lithium.   F. She has not had any problems with vertigo or orthostasis recently, despite the fact that she stopped using her abdominal belt prescribed by Dr. Caryl Comes. Her allergies have bothered her fairly consistently.    G. She has a recent neuro evaluation for OSA. She is due for a sleep study soon.    4. Pertinent Review of Systems:  Constitutional: The patient feels "pretty good really".  She continues to have problems with insomnia and early awakening at times.  She also has nocturia. Her energy level is pretty good. She has been walking  much more. Her mood has been good recently. She is "getting real active at church" and is getting out more. Eyes: Vision is good as long as she wears her glasses. She had her last eye exam in early November 2017. No eye disease was noted. There are no other significant eye complaints. Neck: The patient has no  complaints of anterior neck swelling, soreness, tenderness,  pressure, discomfort, or difficulty swallowing.  Heart: She has no complaints of palpitations, irregular heat beats, chest pain, or chest pressure. Gastrointestinal: Bowel movements have been normal. She has no complaints of acid reflux, upset stomach, stomach aches or pains. Arms: Her shoulders are less painful since her steroid injections.   Legs: She still has pains in her hips and knees, but she is also able to walk more without severe discomfort. Muscle mass and strength seem normal. There are no complaints of numbness, tingling, or burning. No edema is noted. Feet: There are no obvious foot problems. There are no complaints of numbness, tingling, burning, or pain. No edema is noted. Hypoglycemia: None  5. BG printout: Her morning BGs vary from 98-126. The only BG >120 was a 126 occurred after having several gelato cookies the night before.   PAST MEDICAL, FAMILY, AND SOCIAL HISTORY:  Past Medical History:  Diagnosis Date  . Abscess of liver(572.0)   . Atrial fibrillation (HCC)    chronic anticoag - pradaxa  . BIPOLAR AFFECTIVE DISORDER   . Cancer ([place])    basal cell on abdomen  . Chronic bipolar disorder ([place])   . Complete AV block ([place])    s/p PPM--MEDTRONIC ADAPTA ADDr01  . COPD (chronic obstructive pulmonary disease) (HCC)    TOBACCO ABUSE  . Coronary artery disease    s/p PTCA  . DEPRESSION   . DIABETES MELLITUS, TYPE II dx 04/2010  . DYSLIPIDEMIA   . GERD (gastroesophageal reflux disease)   . HYPERTENSION   . HYPOTHYROIDISM    hashimoto's  . Pacemaker-Medtronic-dual-chamber   . Parathyroid related hypercalcemia ([place])   . Primary hyperparathyroidism ([place])   . Takotsubo syndrome   . TOBACCO ABUSE   . Vitamin D deficiency disease     Family History  Problem Relation Age of Onset  . CAD Mother   . CAD Father   . Colon cancer      FH  . Diabetes Maternal Aunt   . Diabetes type II Sister       Current Outpatient Prescriptions:  .  atorvastatin (LIPITOR) 80 MG tablet, TAKE 1 TABLET EVERY DAY  AT  6:00PM, Disp: 90 tablet, Rfl: 3 .  donepezil (ARICEPT) 10 MG tablet, Take 1 tablet (10 mg total) by mouth at bedtime., Disp: 90 tablet, Rfl: 1 .  FLUoxetine (PROZAC) 10 MG capsule, Take 10 mg by mouth daily., Disp: , Rfl:  .  lithium carbonate 300 MG capsule, , Disp: , Rfl:  .  loratadine (CLARITIN) 10 MG tablet, TAKE 1 TABLET EVERY DAY, Disp: 90 tablet, Rfl: 2 .  losartan (COZAAR) 50 MG tablet, Take 1 tablet (50 mg total) by mouth daily., Disp: 90 tablet, Rfl: 3 .  metFORMIN (GLUCOPHAGE-XR) 500 MG 24 hr tablet, TAKE 1 TABLET EVERY DAY WITH BREAKFAST, Disp: 90 tablet, Rfl: 3 .  Multiple Vitamin (MULTIVITAMIN WITH MINERALS) TABS tablet, Take 1 tablet by mouth at bedtime., Disp: , Rfl:  .  omeprazole (PRILOSEC) 40 MG capsule, TAKE 1 CAPSULE EVERY DAY  BEFORE  BREAKFAST (Patient taking differently: Take 40 mg by mouth daily before breakfast. ),  Disp: 90 capsule, Rfl: 3 .  PRADAXA 150 MG CAPS capsule, TAKE 1 CAPSULE TWICE DAILY, Disp: 180 capsule, Rfl: 2 .  SYNTHROID 112 MCG tablet, TAKE 1 TABLET EVERY DAY, Disp: 90 tablet, Rfl: 3 .  VESICARE 5 MG tablet, TAKE 1 TABLET EVERY DAY, Disp: 90 tablet, Rfl: 3 .  VESICARE 5 MG tablet, TAKE 1 TABLET EVERY DAY, Disp: 90 tablet, Rfl: 2  Allergies as of 07/10/2016 - Review Complete 07/10/2016  Allergen Reaction Noted  . Codeine Nausea And Vomiting   . Latex Other (See Comments) 05/03/2012  . Metronidazole Other (See Comments) 03/26/2008  . Other Rash 01/24/2013  . Penicillins Swelling and Rash     1. Work and Family: Her daughter, [name], lives with her. [name] works as a Marine scientist at the [employer].   2. Activities: Adine has been walking more.      3. Smoking, alcohol, or drugs: She resumed smoking cigarettes.    4. Primary Care Provider: Binnie Rail, MD  5. Psych: Comer Locket, PA, [facility], office [phone], fax  [phone]  REVIEW OF SYSTEMS: There are no other significant problems involving Stacy [name]'s other body systems.   Objective:  Vital Signs:  BP 124/76   Pulse 86   Wt 112 lb 9.6 oz (51.1 kg)   BMI 19.33 kg/m    Ht Readings from Last 3 Encounters:  07/05/16 5\' 4"  (1.626 m)  11/01/15 5\' 5"  (1.651 m)  10/11/15 5' 5.5" (1.664 m)   Wt Readings from Last 3 Encounters:  07/10/16 112 lb 9.6 oz (51.1 kg)  07/05/16 115 lb (52.2 kg)  04/26/16 115 lb (52.2 kg)   PHYSICAL EXAM:  Constitutional: The patient appears healthy and more slender. She looks good.  Her weight has decreased by 7 pounds. Her mood and affect are quite normal. Her insight is good.  Head: Her head tremor is still present, but not obvious when she is relaxed.  Face: The face appears normal.  Eyes: There is no obvious arcus or proptosis. Moisture appears normal. Mouth: The oropharynx and tongue appear normal. Oral moisture is normal. Neck: The neck appears to be visibly normal. No carotid bruits are noted. She has a low-lying thyroid gland. The thyroid gland is again within normal size at 18-20 grams. Both lobes today are within normal limits for size. The consistency of the thyroid gland is normal. The thyroid gland is not tender to palpation. Lungs: The lungs are clear to auscultation. Air movement is good. Heart: Heart rate and rhythm are regular. Heart sounds S1 and S2 are normal. I did not appreciate any pathologic cardiac murmurs. Abdomen: The abdomen is normal for age. Bowel sounds are normal. There is no obvious hepatomegaly, splenomegaly, or other mass effect.  Arms: Muscle size and bulk are normal for age.  Hands: She has a 1+ hand tremor. Phalangeal and metacarpophalangeal joints are normal. Palmar muscles are normal. Palmar skin is normal. Palmar moisture is also normal. Fingernails are somewhat pale. Legs: Muscle size and bulk are below normal for age. No edema is present. Feet: She has a 1-2+ dorsalis pedis  pulse on the right and 1+ on the left. Her feet look good. Neurologic: Strength is normal for age in both the upper and lower extremities. Muscle tone is normal. Sensation to touch is normal in both legs. Sensation to touch, monofilament, and vibration are normal in her feet. She walked normally, without any of the hesitation and rigidity usually seen with Parkinson's disease.  LAB DATA:   Labs HbA1c 6.1%  Labs 12/15/15: HbA1c 6.3%.  Labs 10/25/15: HbA1c 6.5%; CBC normal; CMP normal except for glucose 133; cholesterol 106, triglycerides 114, HDL 54.30, LDL 29  Labs 06/08/15: HbA1c 6/1%; lithium 0.50 (normal 0.80-1.40); vitamin B6 20.5 (normal 2.1-21.7), vitamin B12 1500 (normal 211-911) [Her vitamin B12 level is above the upper limit of normal as defined by the [employer]. She is obviously taking her MVI and her metformin is not causing any Vitamin B12 deficiency. A review of Vitamin B12 by the NIH Office of Dietary Supplements noted a review by the Institute of Medicine that stated "no adverse effects have been associated with excess vitamin B12 intake from food and supplements in healthy individuals". At this point in time I do not see a need to change her MVI dose.]   Labs 06/01/15: TSH 1.632, free T4 1.10, free T3 2.5; PTH 37, calcium 9.8, 25-OH vitamin D 45; cholesterol 99, triglycerides 116, HDL 63, LDL 13  Labs 12/01/14: Hemoglobin A1c was 5.7%.compared with 5.6% at last visit, and with 6.1% at the visit prior.    Labs 10/07/14: Calcium 9.9, PTH 49, 25-OH vitamin D 53; cholesterol 109, triglycerides 109, HDL 69, LDL 18; TSH 3.734, free T4; lithium 0.60 (0.80-1.40)  Labs 12/15/13: TSH 4.984, free T4 1.11, free T3 2.6; lithium 0.60  Labs 11/05/13: Cholesterol 117, triglycerides 112, HDL 63.5, LDL 31; TSH 4.84  Labs 05/05/13: TSH 3.575, free T4 1.46, free T3 2.5  Labs 01/30/13: Cholesterol 93, triglycerides 61, HDL 56, LDL 24  Labs 10/30/12: TSH 2.550, free T4 1.24, free T3 2.5; calcium  10.4, PTH 36.3, 24-hydroxy vitamin D 42; lithium 0.80 (0.80-1.40)   Labs 04/18/12: TSH was 2.106, Free T4 was 1.41. Free T3 was 2.8.                   Labs 10/25/11: PTH 30.8, calcium 10.4, 25-hydroxy vitamin D 43; WBC 7.8, hemoglobin 14.9, hematocrit 46.9%, iron 71             Assessment and Plan:   ASSESSMENT:  1-3. Hypothyroidism/ Hashimoto's thyroiditis/goiter:   A. Her TFTs in September 2014 were c/w a recent flare up of Hashimoto's Dz. We could not make any decisions about whether or not to change her Synthroid dose until the TFTs stabilized.   B. Her TFTs in April 2015 were mildly low. Since she was in the midst of an episode of lithium toxicity and may have missed some Synthroid doses, I decided not to change her Synthroid dose.    C. Her TFTs in March 2016 were slightly low. Her TFTs in October 2016, however, were mid-range normal on her current dose of Synthroid.  We need to repeat her TFTs now.   D. Her thyroid gland is again within normal limits for size and consistency. Her thyroiditis is again clinically quiescent  4. Hypertension: Her blood pressure is pretty good today. She should continue her walking regimen or use her exercise bike during inclement weather.    5. T2DM/prediabetes: Her HbA1c value is less today than in March, but is still in the prediabetic zone. Although she still produces a fairly good amount of insulin, she still needs to be very careful with her diet. If she plans to over-indulge in carbs during the holidays, She will need to resume taking metformin twice daily.  6.  Fatigue: She is really feeling very well. 7.  Hyperparathyroidism and vitamin D deficiency: Her labs in March and October 2016 were  normal. She was very compliant with taking her Caltrate-D and MVI each evening.   8. Tobacco abuse: She resumed smoking regular cigarettes against my advice and Dr. Olin Pia advice. Her daughter tells her that the cigarettes are going to kill her. I again told her the  same thing. She is trying to reduce her cigarette usage by one cigarette per week.  9. Tremor: Her tremor is not due to Parkinson's Dz, or to deficiencies of either vitamin B6 or vitamin B12. 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 She was doing better with propranolol.   10. Pallor: Her pallor has resolved. Her iron level, hemoglobin, and hematocrit were fine 2 years ago and again in March 2017.  11. Hyperlipidemia: Her lipids were very good in March and October 2016 and in March 2017. 12. Bipolar disorder: She looks well today, Mr. Jacques Earthly is following this issue.  13. Unintentional weight loss: Since she resumed smoking her weight has decreased. Her weight loss is not due to underinsulinization.   PLAN:  1. Diagnostic:  HbA1c today. TFTs, calcium, PTH, 25-OH vitamin D. 2. Therapeutic:  Eat Right, exercise right (walk 30-60 minutes per day or use her exercise bike), and take your Synthroid, calcium, and metformin.  3. Patient education: We discussed her T2DM and the fact that as she ages her ability to produce surge amounts of insulin has diminished. Therefore, she has to be careful with her carb intake. We also dicussed her calcium and vitamin D balance and the possibility that she could re-develop secondary hyperparathyroidism if her intake of calcium and vitamin D is inadequate. I again begged her to stop smoking. We also discussed her thyroiditis and hypothyroidism.  4. Follow-up: 6 months with me. Please schedule FU visits with Dr. Quay Burow and Mr. Jacques Earthly as they desire.  Level of Service: This visit lasted in excess of 55 minutes. More than 50% of the visit was devoted to counseling.   Sherrlyn Hock, MD Adult and Pediatric Endocrinology 07/10/2016 10:21 AM

## 2016-07-10 NOTE — Patient Instructions (Signed)
Follow up visit in 6 months. 

## 2016-07-11 LAB — PTH, INTACT AND CALCIUM
Calcium: 10 mg/dL (ref 8.6–10.4)
PTH: 28 pg/mL (ref 14–64)

## 2016-07-11 LAB — T3, FREE: T3 FREE: 2.5 pg/mL (ref 2.3–4.2)

## 2016-07-11 LAB — T4, FREE: Free T4: 1 ng/dL (ref 0.8–1.8)

## 2016-07-11 LAB — TSH: TSH: 2.8 mIU/L

## 2016-07-12 ENCOUNTER — Ambulatory Visit (INDEPENDENT_AMBULATORY_CARE_PROVIDER_SITE_OTHER): Payer: Commercial Managed Care - HMO | Admitting: Neurology

## 2016-07-12 ENCOUNTER — Encounter: Payer: Self-pay | Admitting: Internal Medicine

## 2016-07-12 ENCOUNTER — Ambulatory Visit (INDEPENDENT_AMBULATORY_CARE_PROVIDER_SITE_OTHER): Payer: Commercial Managed Care - HMO | Admitting: Internal Medicine

## 2016-07-12 VITALS — BP 144/76 | HR 77 | Temp 97.6°F | Resp 16 | Wt 113.0 lb

## 2016-07-12 DIAGNOSIS — Z9861 Coronary angioplasty status: Secondary | ICD-10-CM

## 2016-07-12 DIAGNOSIS — G4720 Circadian rhythm sleep disorder, unspecified type: Secondary | ICD-10-CM

## 2016-07-12 DIAGNOSIS — E038 Other specified hypothyroidism: Secondary | ICD-10-CM

## 2016-07-12 DIAGNOSIS — G4733 Obstructive sleep apnea (adult) (pediatric): Secondary | ICD-10-CM | POA: Diagnosis not present

## 2016-07-12 DIAGNOSIS — I2510 Atherosclerotic heart disease of native coronary artery without angina pectoris: Secondary | ICD-10-CM | POA: Diagnosis not present

## 2016-07-12 DIAGNOSIS — I10 Essential (primary) hypertension: Secondary | ICD-10-CM | POA: Diagnosis not present

## 2016-07-12 DIAGNOSIS — K219 Gastro-esophageal reflux disease without esophagitis: Secondary | ICD-10-CM

## 2016-07-12 DIAGNOSIS — E7800 Pure hypercholesterolemia, unspecified: Secondary | ICD-10-CM | POA: Diagnosis not present

## 2016-07-12 DIAGNOSIS — N3281 Overactive bladder: Secondary | ICD-10-CM

## 2016-07-12 DIAGNOSIS — G4761 Periodic limb movement disorder: Secondary | ICD-10-CM

## 2016-07-12 DIAGNOSIS — I480 Paroxysmal atrial fibrillation: Secondary | ICD-10-CM

## 2016-07-12 DIAGNOSIS — E119 Type 2 diabetes mellitus without complications: Secondary | ICD-10-CM

## 2016-07-12 DIAGNOSIS — Z8679 Personal history of other diseases of the circulatory system: Secondary | ICD-10-CM

## 2016-07-12 DIAGNOSIS — R9431 Abnormal electrocardiogram [ECG] [EKG]: Secondary | ICD-10-CM

## 2016-07-12 DIAGNOSIS — E063 Autoimmune thyroiditis: Secondary | ICD-10-CM

## 2016-07-12 DIAGNOSIS — F319 Bipolar disorder, unspecified: Secondary | ICD-10-CM

## 2016-07-12 LAB — VITAMIN D 1,25 DIHYDROXY
VITAMIN D 1, 25 (OH) TOTAL: 45 pg/mL (ref 18–72)
VITAMIN D2 1, 25 (OH): 25 pg/mL
VITAMIN D3 1, 25 (OH): 20 pg/mL

## 2016-07-12 MED ORDER — BUSPIRONE HCL 5 MG PO TABS: 5.0000 mg | ORAL_TABLET | Freq: Two times a day (BID) | ORAL | Status: DC

## 2016-07-12 NOTE — Progress Notes (Signed)
Pre visit review using our clinic review tool, if applicable. No additional management support is needed unless otherwise documented below in the visit note. 

## 2016-07-12 NOTE — Assessment & Plan Note (Signed)
Rated controlled, in sinus Taking pradaxa Following with cards

## 2016-07-12 NOTE — Assessment & Plan Note (Addendum)
Last a1c 6.1%  management per endocrine Eye exam up to date

## 2016-07-12 NOTE — Assessment & Plan Note (Signed)
GERD controlled Continue daily medication  

## 2016-07-12 NOTE — Assessment & Plan Note (Signed)
Asymptomatic Following with cardiology Continue current medications 

## 2016-07-12 NOTE — Assessment & Plan Note (Signed)
managemeet per psychiatry

## 2016-07-12 NOTE — Progress Notes (Signed)
Subjective:    Patient ID: Stacy [name], female    DOB: Jul 25, 1941, 75 y.o.   MRN: [id number]  HPI The patient is here for follow up.  She is going to have a sleep study tonight.    CAD, Afib, Hypertension: She is taking her medication daily. She is compliant with a low sodium diet.  She denies chest pain, palpitations, edema, shortness of breath and regular headaches. She is exercising regularly.  She does monitor her blood pressure at home on occasion - 126/76.    Hyperlipidemia: She is taking her medication daily. She is compliant with a low fat/cholesterol diet. She is exercising regularly. She denies myalgias.   Hypothyroidism:  She is taking her medication daily.  She denies any recent changes in energy or weight that are unexplained.   GERD:  She is taking her medication daily as prescribed.  She denies any GERD symptoms and feels her GERD is well controlled.   Diabetes: She sees endocrine -  Dr Tobe Sos. Her last a1c was 6.1%.  She denies hypoglycemia.  She is taking her medication daily as prescribed. She is compliant with a diabetic diet. She is exercising regularly.  She checks her feet daily and denies foot lesions. She is up-to-date with an ophthalmology examination.     Medications and allergies reviewed with patient and updated if appropriate.  Patient Active Problem List   Diagnosis Date Noted  . Bilateral shoulder pain 04/26/2016  . Polyarthralgia 04/03/2016  . Greater trochanteric bursitis of left hip 06/17/2015  . Allergic rhinitis 08/04/2013  . Hypothyroidism, acquired, autoimmune 05/10/2013  . OAB (overactive bladder)   . Parathyroid related hypercalcemia ([place])   . Primary hyperparathyroidism ([place])   . Vitamin D deficiency disease   . Pacemaker-Medtronic-dual-chamber 11/17/2010  . Diabetes ([place]) 09/08/2010  . TREMOR 01/17/2010  . ATRIAL FIBRILLATION 11/11/2009  . DEPRESSION 08/11/2009  . ABSCESS OF LIVER 08/11/2009  . TOBACCO ABUSE 03/22/2009  .  CAD S/P percutaneous coronary angioplasty 03/26/2008  . AV block-complete-intermittent 03/26/2008  . Hyperlipidemia 02/25/2008  . Bipolar disorder ([place]) 02/25/2008  . Essential hypertension 02/25/2008  . DIVERTICULOSIS, COLON 02/25/2008    Current Outpatient Prescriptions on File Prior to Visit  Medication Sig Dispense Refill  . atorvastatin (LIPITOR) 80 MG tablet TAKE 1 TABLET EVERY DAY  AT  6:00PM 90 tablet 3  . donepezil (ARICEPT) 10 MG tablet Take 1 tablet (10 mg total) by mouth at bedtime. 90 tablet 1  . FLUoxetine (PROZAC) 10 MG capsule Take 10 mg by mouth daily.    Marland Kitchen lithium carbonate 300 MG capsule     . loratadine (CLARITIN) 10 MG tablet TAKE 1 TABLET EVERY DAY 90 tablet 2  . losartan (COZAAR) 50 MG tablet Take 1 tablet (50 mg total) by mouth daily. 90 tablet 3  . metFORMIN (GLUCOPHAGE-XR) 500 MG 24 hr tablet TAKE 1 TABLET EVERY DAY WITH BREAKFAST 90 tablet 3  . Multiple Vitamin (MULTIVITAMIN WITH MINERALS) TABS tablet Take 1 tablet by mouth at bedtime.    Marland Kitchen omeprazole (PRILOSEC) 40 MG capsule TAKE 1 CAPSULE EVERY DAY  BEFORE  BREAKFAST (Patient taking differently: Take 40 mg by mouth daily before breakfast. ) 90 capsule 3  . PRADAXA 150 MG CAPS capsule TAKE 1 CAPSULE TWICE DAILY 180 capsule 2  . SYNTHROID 112 MCG tablet TAKE 1 TABLET EVERY DAY 90 tablet 3  . VESICARE 5 MG tablet TAKE 1 TABLET EVERY DAY 90 tablet 2   No current facility-administered medications on  file prior to visit.     Past Medical History:  Diagnosis Date  . Abscess of liver(572.0)   . Atrial fibrillation (HCC)    chronic anticoag - pradaxa  . BIPOLAR AFFECTIVE DISORDER   . Cancer ([place])    basal cell on abdomen  . Chronic bipolar disorder ([place])   . Complete AV block ([place])    s/p PPM--MEDTRONIC ADAPTA ADDr01  . COPD (chronic obstructive pulmonary disease) (HCC)    TOBACCO ABUSE  . Coronary artery disease    s/p PTCA  . DEPRESSION   . DIABETES MELLITUS, TYPE II dx 04/2010  . DYSLIPIDEMIA   . GERD  (gastroesophageal reflux disease)   . HYPERTENSION   . HYPOTHYROIDISM    hashimoto's  . Pacemaker-Medtronic-dual-chamber   . Parathyroid related hypercalcemia ([place])   . Primary hyperparathyroidism ([place])   . Takotsubo syndrome   . TOBACCO ABUSE   . Vitamin D deficiency disease     Past Surgical History:  Procedure Laterality Date  . ABDOMINAL HYSTERECTOMY    . CARDIAC CATHETERIZATION    . EP IMPLANTABLE DEVICE N/A 08/05/2015   Procedure:  PPM Generator Changeout;  Surgeon: Deboraha Sprang, MD;  Location: [facility];  Service: Cardiovascular;  Laterality: N/A;  . INSERT / REPLACE / REMOVE PACEMAKER     MEDTRONIC ADAPTA ADDr01  . MASS EXCISION Left 01/27/2013   Procedure: EXCISION MASS LEFT FLANK;  Surgeon: Earnstine Regal, MD;  Location: [facility];  Service: General;  Laterality: Left;  . PARATHYROIDECTOMY     RIGHT INFERIOR    Social History   Social History  . Marital status: Widowed    Spouse name: N/A  . Number of children: 2  . Years of education: 12   Occupational History  .  Retired   Social History Main Topics  . Smoking status: Current Every Day Smoker    Packs/day: 1.00    Years: 48.00    Types: Cigarettes  . Smokeless tobacco: Never Used     Comment: switched to vapor cigs. Widowed- 2 girls. Lives with daughter (who is nurse at [employer])  . Alcohol use No     Comment: "once in a blue moon" when I have [place] food  . Drug use: No  . Sexual activity: No   Other Topics Concern  . None   Social History Narrative   WIDOWED   2 DAUGHTERS   LIVES W/DAUGHTER   CURRENTLY SMOKES   NO ALCOHOL USE   NO ILLICIT DRUG USE   DAILY CAFFEINE USE, 2 per day       PPM-MEDTRONIC   PATIENT SIGNED A DESIGNATED PARTY RELEASE TO ALLOW DAUGHTER, NICOLE COX, TO HAVE ACCESS TO HER MEDICAL RECORDS/INFORMATION. Fleet Contras, November 09, 2009 9:19 AM    Family History  Problem Relation Age of Onset  . CAD Mother   . CAD Father   . Colon cancer      FH  . Diabetes  Maternal Aunt   . Diabetes type II Sister     Review of Systems  Constitutional: Negative for fever.  Eyes: Negative for visual disturbance.  Respiratory: Negative for cough, shortness of breath and wheezing.   Cardiovascular: Negative for chest pain, palpitations and leg swelling.  Neurological: Negative for dizziness, light-headedness, numbness and headaches.       Objective:   Vitals:   07/12/16 0941  BP: (!) 144/76  Pulse: 77  Resp: 16  Temp: 97.6 F (36.4 C)  Filed Weights   07/12/16 0941  Weight: 113 lb (51.3 kg)   Body mass index is 19.4 kg/m.   Physical Exam    Constitutional: Appears well-developed and well-nourished. No distress.  HENT:  Head: Normocephalic and atraumatic.  Neck: Neck supple. No tracheal deviation present. No thyromegaly present.  No cervical lymphadenopathy Cardiovascular: Normal rate, regular rhythm and normal heart sounds.   No murmur heard. No carotid bruit .  No edema Pulmonary/Chest: Effort normal and breath sounds normal. No respiratory distress. No has no wheezes. No rales.  Skin: Skin is warm and dry. Not diaphoretic.  Psychiatric: Normal mood and affect. Behavior is normal.      Assessment & Plan:    See Problem List for Assessment and Plan of chronic medical problems.

## 2016-07-12 NOTE — Assessment & Plan Note (Signed)
Management per endo 

## 2016-07-12 NOTE — Patient Instructions (Addendum)
   All other Health Maintenance issues reviewed.   All recommended immunizations and age-appropriate screenings are up-to-date or discussed.  No immunizations administered today.   Medications reviewed and updated.  No changes recommended at this time.    Please followup in 6 months   

## 2016-07-12 NOTE — Assessment & Plan Note (Signed)
Taking vesicare daily continue

## 2016-07-12 NOTE — Assessment & Plan Note (Signed)
BP Readings from Last 3 Encounters:  07/12/16 (!) 144/76  07/10/16 124/76  07/05/16 110/68   Overall well controlled Continue current medications

## 2016-07-20 ENCOUNTER — Telehealth: Payer: Self-pay

## 2016-07-20 NOTE — Progress Notes (Signed)
Patient referred by Comer Locket, seen by me on 07/05/16, diagnostic PSG on 07/12/16.    Please call and notify the patient that the recent sleep study did confirm the diagnosis of mild to severe obstructive sleep apnea (during REM and supine sleep) and that I recommend treatment for this in the form of CPAP, due to her medical and her cardiac history in particular. This will require a repeat sleep study for proper titration and mask fitting. Please explain to patient and arrange for a CPAP titration study. I have placed an order in the chart. Thanks, and please route to [facility] for scheduling next sleep study.  Star Age, MD, PhD [facility])

## 2016-07-20 NOTE — Telephone Encounter (Signed)
I called pt to discuss sleep study results. No answer, left her a message asking her to call me back.

## 2016-07-20 NOTE — Telephone Encounter (Signed)
-----   Message from Star Age, MD sent at 07/20/2016  8:07 AM EST ----- Patient referred by Comer Locket, seen by me on 07/05/16, diagnostic PSG on 07/12/16.    Please call and notify the patient that the recent sleep study did confirm the diagnosis of mild to severe obstructive sleep apnea (during REM and supine sleep) and that I recommend treatment for this in the form of CPAP, due to her medical and her cardiac history in particular. This will require a repeat sleep study for proper titration and mask fitting. Please explain to patient and arrange for a CPAP titration study. I have placed an order in the chart. Thanks, and please route to [facility] for scheduling next sleep study.  Star Age, MD, PhD [facility])

## 2016-07-20 NOTE — Telephone Encounter (Signed)
I spoke to pt. I advised her that her sleep study showed sleep apnea and that Dr. Rexene Alberts recommends treatment in the form of a cpap. This will require a repeat sleep study for proper titration and mask fitting. Pt says that she does not want to come back in for another sleep study because "that lady kept waking me up every time I got to sleep." I advised her that the cpap titration is the recommended next step. Pt says that she will call me after Christmas because she wants to think about it. Pt verbalized understanding of results. Pt had no questions at this time but was encouraged to call back if questions arise.

## 2016-07-20 NOTE — Telephone Encounter (Signed)
I think she would do better with an actual titration, so let's call her after the holidays to revisit.

## 2016-07-20 NOTE — Addendum Note (Signed)
Addended by: Star Age on: 07/20/2016 08:07 AM   Modules accepted: Orders

## 2016-07-20 NOTE — Procedures (Signed)
PATIENT'S NAME:  Stacy [name], Stacy [name] DOB:      Feb 23, 1941      MR#:    [id number]     DATE OF RECORDING: 07/12/2016 REFERRING M.D.:  Billey Gosling MD Study Performed:   Baseline Polysomnogram HISTORY: 75 year old woman with a history of hyperlipidemia, reflux disease, hypertension, hypothyroidism, complete heart block, status post pacemaker placement, A. fib, history of liver abscess, bipolar disorder, basal cell skin cancer, COPD with ongoing smoking, heart disease, status post PTCA, and vitamin D deficiency, who reports snoring and nonrestorative sleep, ESS of 5/24 points. The patient's weight 115 pounds with a height of 64 (inches), resulting in a BMI of 19.6 kg/m2. The patient's neck circumference measured 12.2 inches.  CURRENT MEDICATIONS: Lipitor, Aricept, Prozac, Lithium Carbonate, Claritin, Cozaar, Metformin, Multivitamin, Pradaxa, Synthroid, Vesicare,   PROCEDURE:  This is a multichannel digital polysomnogram utilizing the Somnostar 11.2 system.  Electrodes and sensors were applied and monitored per AASM Specifications.   EEG, EOG, Chin and Limb EMG, were sampled at 200 Hz.  ECG, Snore and Nasal Pressure, Thermal Airflow, Respiratory Effort, CPAP Flow and Pressure, Oximetry was sampled at 50 Hz. Digital video and audio were recorded.      BASELINE STUDY Lights Out was at 22:48 and Lights On at 05:01.  Total recording time (TRT) was 373.5 minutes, with a total sleep time (TST) of  257 minutes.   The patient's sleep latency was 44.5 minutes.  REM latency was 139.5 minutes.  The sleep efficiency was 68.8 %.     SLEEP ARCHITECTURE: WASO (Wake after sleep onset) was 72 minutes with mild to moderate sleep fragmentation noted.  There were 10.5 minutes in Stage N1, 227.5 minutes Stage N2, 0 minutes Stage N3 and 19 minutes in Stage REM.  The percentage of Stage N1 was 4.1%, Stage N2 was 88.5%, which is markedly increased, Stage N3 was absent, and Stage R (REM sleep) was 7.4%, which is reduced.  Audio and  video analysis did not show any abnormal or unusual movements, behaviors, phonations or vocalizations.  The patient took 1 bathroom break. Mild to moderate snoring was noted. The EKG was in keeping with normal sinus rhythm (NSR) with rare PVCs. Pacer artifact was noted.   RESPIRATORY ANALYSIS: There were a total of 51 respiratory events:  28 obstructive apneas, 0 central apneas and 0 mixed apneas with a total of 28 apneas and an apnea index (AI) of 6.5 /hour. There were 23 hypopneas with a hypopnea index of 5.4 /hour. The patient also had 18 respiratory event related arousals (RERAs).      The total APNEA/HYPOPNEA INDEX (AHI) was 11.9/hour and the total RESPIRATORY DISTURBANCE INDEX was 16.1 /hour.  17 events occurred in REM sleep and 36 events in NREM. The REM AHI was 53.7 /hour, versus a non-REM AHI of 8.6. The patient spent 99.5 minutes of total sleep time in the supine position and 158 minutes in non-supine.. The supine AHI was 30.8 versus a non-supine AHI of 0.0.  OXYGEN SATURATION & C02:  The baseline 02 saturation was 95%, with the lowest being 88%. Time spent below 89% saturation equaled 1 minutes.  PERIODIC LIMB MOVEMENTS:   The patient had a total of 269 Periodic Limb Movements.  The Periodic Limb Movement (PLM) index was 62.8 and the PLM Arousal index was 1.2/hour. Post-study, the patient indicated that sleep was better than usual.   IMPRESSION: 1. Obstructive Sleep Apnea (OSA) 2. Periodic Limb Movement Disorder (PLMD) 3. Dysfunctions associated with sleep stages or arousal  from sleep 4. Non-specific abnormal EKG   RECOMMENDATIONS: 1. This overnight polysomnogram demonstrates overall mild obstructive sleep apnea, but severe REM related OSA and severe supine OSA. Please note, the little amount of REM sleep during this study may underestimate her AHI and O2 nadir. Treatment options may include: Positive airway pressure, avoidance of the supine sleep position, weight loss, an oral  appliance (aka dental device, custom made by a specialized dentist usually), or upper airway or jaw surgery (not usually first line treatments). Given her complex medical and particularly her cardiac history, treatment with positive airway pressure in the form of CPAP is recommended; the patient will be advised to return for a full night titration study for proper treatment settings and mask fitting.  2. The study showed occasional PVCs and pacer artifact, no unexpected findings; clinical correlation is recommended. 3. Severe PLMs (periodic limb movements of sleep) were noted during the study with minimal arousals; clinical correlation is recommended. Medication effect from the patient's antidepressant medication should be considered.  4. This study shows sleep fragmentation and abnormal sleep stage percentages; these are nonspecific findings and per se do not signify an intrinsic sleep disorder or a cause for the patient's sleep-related symptoms. Causes include (but are not limited to) the first night effect of the sleep study, circadian rhythm disturbances, medication effect or an underlying mood disorder or medical problem.  5. The patient should be cautioned not to drive, work at heights, or operate dangerous or heavy equipment when tired or sleepy. Review and reiteration of good sleep hygiene measures should be pursued with any patient. 6. Please note that untreated obstructive sleep apnea carries additional perioperative morbidity. Patients with significant obstructive sleep apnea should receive perioperative PAP therapy and the surgeons and particularly the anesthesiologist should be informed of the diagnosis and the severity of the sleep disordered breathing. 7. The patient will be seen in follow-up by Dr. Rexene Alberts at [facility] for discussion of the test results and further management strategies. The referring provider will be notified of the test results.  I certify that I have reviewed the entire raw data  recording prior to the issuance of this report in accordance with the Standards of Accreditation of the American Academy of Sleep Medicine (AASM)   Star Age, MD, PhD Diplomat, American Board of Psychiatry and Neurology (Neurology and Sleep Medicine)

## 2016-07-28 ENCOUNTER — Encounter: Payer: Self-pay | Admitting: Internal Medicine

## 2016-07-28 ENCOUNTER — Other Ambulatory Visit: Payer: Self-pay | Admitting: Internal Medicine

## 2016-08-02 ENCOUNTER — Other Ambulatory Visit: Payer: Self-pay | Admitting: Emergency Medicine

## 2016-08-02 MED ORDER — OMEPRAZOLE 40 MG PO CPDR: DELAYED_RELEASE_CAPSULE | ORAL | 3 refills | Status: DC

## 2016-08-15 ENCOUNTER — Telehealth: Payer: Self-pay | Admitting: Internal Medicine

## 2016-08-15 NOTE — Telephone Encounter (Signed)
Rec'd from [employer] for 1 page to Dr. Quay Burow

## 2016-08-16 NOTE — Telephone Encounter (Signed)
I called pt. She has decided to proceed with a cpap titration and has scheduled it with Dawn. Pt does not have any further questions.

## 2016-08-28 ENCOUNTER — Ambulatory Visit (INDEPENDENT_AMBULATORY_CARE_PROVIDER_SITE_OTHER): Payer: Medicare HMO | Admitting: *Deleted

## 2016-08-28 DIAGNOSIS — I442 Atrioventricular block, complete: Secondary | ICD-10-CM | POA: Diagnosis not present

## 2016-08-28 NOTE — Progress Notes (Signed)
Remote pacemaker transmission.   

## 2016-08-29 ENCOUNTER — Ambulatory Visit (INDEPENDENT_AMBULATORY_CARE_PROVIDER_SITE_OTHER): Payer: Medicare HMO | Admitting: Neurology

## 2016-08-29 DIAGNOSIS — G4733 Obstructive sleep apnea (adult) (pediatric): Secondary | ICD-10-CM | POA: Diagnosis not present

## 2016-08-29 DIAGNOSIS — G4720 Circadian rhythm sleep disorder, unspecified type: Secondary | ICD-10-CM

## 2016-08-29 DIAGNOSIS — R9431 Abnormal electrocardiogram [ECG] [EKG]: Secondary | ICD-10-CM

## 2016-08-29 LAB — CUP PACEART REMOTE DEVICE CHECK
Battery Impedance: 111 Ohm
Brady Statistic AP VP Percent: 6 %
Brady Statistic AS VP Percent: 1 %
Brady Statistic AS VS Percent: 48 %
Implantable Lead Implant Date: 20010521
Implantable Lead Location: 753859
Implantable Lead Model: 4592
Implantable Lead Model: 6940
Implantable Pulse Generator Implant Date: 20161229
Lead Channel Impedance Value: 1133 Ohm
Lead Channel Impedance Value: 640 Ohm
Lead Channel Pacing Threshold Amplitude: 0.625 V
Lead Channel Pacing Threshold Amplitude: 1.375 V
Lead Channel Setting Pacing Amplitude: 2.75 V
Lead Channel Setting Sensing Sensitivity: 5.6 mV
MDC IDC LEAD IMPLANT DT: 20010521
MDC IDC LEAD LOCATION: 753860
MDC IDC MSMT BATTERY REMAINING LONGEVITY: 161 mo
MDC IDC MSMT BATTERY VOLTAGE: 2.8 V
MDC IDC MSMT LEADCHNL RA PACING THRESHOLD PULSEWIDTH: 0.4 ms
MDC IDC MSMT LEADCHNL RV PACING THRESHOLD PULSEWIDTH: 0.4 ms
MDC IDC SESS DTM: 20180122134611
MDC IDC SET LEADCHNL RA PACING AMPLITUDE: 2 V
MDC IDC SET LEADCHNL RV PACING PULSEWIDTH: 0.4 ms
MDC IDC STAT BRADY AP VS PERCENT: 46 %

## 2016-08-30 ENCOUNTER — Encounter: Payer: Self-pay | Admitting: Cardiology

## 2016-08-31 ENCOUNTER — Telehealth: Payer: Self-pay

## 2016-08-31 NOTE — Procedures (Signed)
PATIENT'S NAME:  Stacy [name], Stacy [name] DOB:      1941-03-30      MR#:    [id number]     DATE OF RECORDING: 08/29/2016 REFERRING M.D.:  Billey Gosling MD Study Performed:   CPAP  Titration HISTORY:  76 year old woman with a history of hyperlipidemia, reflux disease, hypertension, hypothyroidism, complete heart block, status post pacemaker placement, A. fib, history of liver abscess, bipolar disorder, basal cell skin cancer, COPD with ongoing smoking, heart disease, status post PTCA, and vitamin D deficiency, who presents for a full night CPAP titration to treat her OSA. Her baseline study from 07/12/16 showed an AHI of 11.9/hour, REM AHI was 53.7 /hour, supine AHI was 30.8/hour and O2 nadir was 88%. The patient's weight 115 pounds with a height of 64 (inches), resulting in a BMI of 19.6 kg/m2.   CURRENT MEDICATIONS: Lipitor, Aricept, Prozac, Lithium Carbonate, Claritin, Cozaar, Metformin, Multivitamin, Pradaxa, Synthroid, Vesicare,  PROCEDURE:  This is a multichannel digital polysomnogram utilizing the SomnoStar 11.2 system.  Electrodes and sensors were applied and monitored per AASM Specifications.   EEG, EOG, Chin and Limb EMG, were sampled at 200 Hz.  ECG, Snore and Nasal Pressure, Thermal Airflow, Respiratory Effort, CPAP Flow and Pressure, Oximetry was sampled at 50 Hz. Digital video and audio were recorded.      Patient preferred a FFM and was fitted with a small Simplus FFM. CPAP was initiated at 5 cmH20 with heated humidity per AASM split night standards and pressure was advanced to 13 cmH20 because of hypopneas, apneas and desaturations and snoring.  At a PAP pressure of 13 cmH20, there was a reduction of the AHI to 0 with non supine REM sleep achieved and O2 nadir of 94%.   Lights Out was at 21:41 and Lights On at 05:22. Total recording time (TRT) was 461 minutes, with a total sleep time (TST) of 398.5 minutes. The patient's sleep latency was 34 minutes. REM latency was 175.5 minutes, which is delayed.   The sleep efficiency was 86.4 %.    SLEEP ARCHITECTURE: WASO (Wake after sleep onset)  was 28 minutes with mild sleep fragmentation noted.  There were 15 minutes in Stage N1, 289.5 minutes Stage N2, 0 minutes Stage N3 and 94 minutes in Stage REM.  The percentage of Stage N1 was 3.8%, Stage N2 was 72.6%, which is increased, Stage N3 was absent, and Stage R (REM sleep) was 23.6%, which is normal.   The arousals were noted as: 38 were spontaneous, 0 were associated with PLMs, 97 were associated with respiratory events.  Audio and video analysis did not show any abnormal or unusual movements, behaviors, phonations or vocalizations.  The patient took no bathroom breaks. The EKG was in keeping with frequent pacer artifacts.    RESPIRATORY ANALYSIS:  There was a total of 97 respiratory events: 0 obstructive apneas, 0 central apneas and 0 mixed apneas with a total of 0 apneas and an apnea index (AI) of 0 /hour. There were 97 hypopneas with a hypopnea index of 14.6/hour. The patient also had 0 respiratory event related arousals (RERAs).      The total APNEA/HYPOPNEA INDEX  (AHI) was 14.6 /hour and the total RESPIRATORY DISTURBANCE INDEX was 14.6 .hour  0 events occurred in REM sleep and 97 events in NREM. The REM AHI was 0 /hour versus a non-REM AHI of 19.1 /hour.  The patient spent 329 minutes of total sleep time in the supine position and 70 minutes in non-supine. The supine AHI was  17.3, versus a non-supine AHI of 1.7.  OXYGEN SATURATION & C02:  The baseline 02 saturation was 92%, with the lowest being 80%. Time spent below 89% saturation equaled 2 minutes.  PERIODIC LIMB MOVEMENTS:  The patient had a total of 0 Periodic Limb Movements.  Post-study, the patient indicated that sleep was the same as usual.    DIAGNOSIS 1.  Obstructive Sleep Apnea  2.  Non-specific abnormal EKG 3. Dysfunctions associated with sleep stages or arousal from sleep  PLANS/RECOMMENDATIONS: 1. This study demonstrates  resolution of the patient's obstructive sleep apnea with CPAP therapy. I will, therefore, start the patient on home CPAP treatment at a pressure of 13 cm via small FFM with heated humidity. The patient should be reminded to be fully compliant with PAP therapy to improve sleep related symptoms and decrease long term cardiovascular risks. The patient should be reminded, that it may take up to 3 months to get fully used to using PAP with all planned sleep. The earlier full compliance is achieved, the better long term compliance tends to be. Please note that untreated obstructive sleep apnea carries additional perioperative morbidity. Patients with significant obstructive sleep apnea should receive perioperative PAP therapy and the surgeons and particularly the anesthesiologist should be informed of the diagnosis and the severity of the sleep disordered breathing. 2. The patient should be cautioned not to drive, work at heights, or operate dangerous or heavy equipment when tired or sleepy. Review and reiteration of good sleep hygiene measures should be pursued with any patient. 3. The study showed frequent pacer artifact, no unexpected findings.  4. This study shows sleep fragmentation and abnormal sleep stage percentages; these are nonspecific findings and per se do not signify an intrinsic sleep disorder or a cause for the patient's sleep-related symptoms. Causes include (but are not limited to) the first night effect of the sleep study, circadian rhythm disturbances, medication effect or an underlying mood disorder or medical problem.  5. The patient will be seen in follow-up by Dr. Rexene Alberts at [facility] for discussion of the test results and further management strategies. The referring provider will be notified of the test results.  I certify that I have reviewed the entire raw data recording prior to the issuance of this report in accordance with the Standards of Accreditation of the American Academy of Sleep Medicine  (AASM)  Star Age, MD, PhD Diplomat, American Board of Psychiatry and Neurology (Neurology and Sleep Medicine)

## 2016-08-31 NOTE — Telephone Encounter (Signed)
-----   Message from Star Age, MD sent at 08/31/2016  8:30 AM EST ----- Patient referred by Comer Locket, seen by me on 07/05/16, diagnostic PSG on 07/12/16, cpap study on 08/29/16.  Please call and inform patient that I have entered an order for treatment with positive airway pressure (PAP) treatment of obstructive sleep apnea (OSA). She did well during the latest sleep study with CPAP. We will, therefore, arrange for a machine for home use through a DME (durable medical equipment) company of Her choice; and I will see the patient back in follow-up in about 8-10 weeks. Please also explain to the patient that I will be looking out for compliance data, which can be downloaded from the machine (stored on an SD card, that is inserted in the machine) or via remote access through a modem, that is built into the machine. At the time of the followup appointment we will discuss sleep study results and how it is going with PAP treatment at home. Please advise patient to bring Her machine at the time of the first FU visit, even though this is cumbersome. Bringing the machine for every visit after that will likely not be needed, but often helps for the first visit to troubleshoot if needed. Please re-enforce the importance of compliance with treatment and the need for [place] to monitor compliance data - often an insurance requirement and actually good feedback for the patient as far as how they are doing.  Also remind patient, that any interim PAP machine or mask issues should be first addressed with the DME company, as they can often help better with technical and mask fit issues. Please ask if patient has a preference regarding DME company.  Please also make sure, the patient has a follow-up appointment with me in about 8-10 weeks from the setup date, thanks.  Once you have spoken to the patient - and faxed/routed report to PCP and referring MD (if other than PCP), you can close this encounter, thanks,   Star Age, MD,  PhD [facility] ([place])

## 2016-08-31 NOTE — Addendum Note (Signed)
Addended by: Star Age on: 08/31/2016 08:30 AM   Modules accepted: Orders

## 2016-08-31 NOTE — Telephone Encounter (Signed)
Patient is aware of results and recommendations. She is willing to start CPAP. I will send order to [facility] per patient request. PCP will get a copy of the report. Patient will get a letter reminding her to make f/u appt and stress the importance of compliance.

## 2016-08-31 NOTE — Progress Notes (Signed)
Patient referred by Comer Locket, seen by me on 07/05/16, diagnostic PSG on 07/12/16, cpap study on 08/29/16.  Please call and inform patient that I have entered an order for treatment with positive airway pressure (PAP) treatment of obstructive sleep apnea (OSA). She did well during the latest sleep study with CPAP. We will, therefore, arrange for a machine for home use through a DME (durable medical equipment) company of Her choice; and I will see the patient back in follow-up in about 8-10 weeks. Please also explain to the patient that I will be looking out for compliance data, which can be downloaded from the machine (stored on an SD card, that is inserted in the machine) or via remote access through a modem, that is built into the machine. At the time of the followup appointment we will discuss sleep study results and how it is going with PAP treatment at home. Please advise patient to bring Her machine at the time of the first FU visit, even though this is cumbersome. Bringing the machine for every visit after that will likely not be needed, but often helps for the first visit to troubleshoot if needed. Please re-enforce the importance of compliance with treatment and the need for [place] to monitor compliance data - often an insurance requirement and actually good feedback for the patient as far as how they are doing.  Also remind patient, that any interim PAP machine or mask issues should be first addressed with the DME company, as they can often help better with technical and mask fit issues. Please ask if patient has a preference regarding DME company.  Please also make sure, the patient has a follow-up appointment with me in about 8-10 weeks from the setup date, thanks.  Once you have spoken to the patient - and faxed/routed report to PCP and referring MD (if other than PCP), you can close this encounter, thanks,   Star Age, MD, PhD [facility] ([place])

## 2016-09-04 NOTE — Telephone Encounter (Signed)
Pt called said she rec'd a call from [facility] reg CPAP. Pt is wanting to know why Dr D wants her to start CPAP, she is wanting Dr D to call her back.

## 2016-09-04 NOTE — Telephone Encounter (Addendum)
I spoke to patient and went over our last conversation. Patient voiced understanding and reports recalling the conversation. She asked me if Dr. Rexene Alberts had anything against calling patients back. I stated that as a general rule our doctors do not call patients. I advised that the doctor's day is filled with appointment and they are seeing patients all day in clinic. Patient states that she didn't realize that CPAP is "that expensive", after asking more about this, patient voiced that she can afford it. Patient thanked me for my time and I advised her to call me back if she has anymore questions.

## 2016-09-07 ENCOUNTER — Encounter: Payer: Self-pay | Admitting: Neurology

## 2016-09-07 DIAGNOSIS — R69 Illness, unspecified: Secondary | ICD-10-CM | POA: Diagnosis not present

## 2016-09-07 NOTE — Telephone Encounter (Signed)
Patient is calling to go over her sleep study results.

## 2016-09-09 ENCOUNTER — Encounter: Payer: Self-pay | Admitting: Internal Medicine

## 2016-09-12 ENCOUNTER — Telehealth: Payer: Self-pay

## 2016-09-12 ENCOUNTER — Other Ambulatory Visit: Payer: Self-pay | Admitting: *Deleted

## 2016-09-12 MED ORDER — GLUCOSE BLOOD VI STRP: ORAL_STRIP | 3 refills | Status: DC

## 2016-09-12 MED ORDER — ONETOUCH VERIO FLEX SYSTEM W/DEVICE KIT: 1.0000 | PACK | Freq: Every day | 0 refills | Status: DC

## 2016-09-12 MED ORDER — ONETOUCH DELICA LANCETS 33G MISC: 3 refills | Status: DC

## 2016-09-12 NOTE — Telephone Encounter (Signed)
I called pt to discuss sleep study results being mailed to her and scheduling a follow up appt with Dr. Rexene Alberts.  No answer, left a message asking her to call me back.  I responded to pt's mychart message as well.

## 2016-09-12 NOTE — Telephone Encounter (Signed)
Pt returned my call. I advised her that we need to schedule her for an appt with Dr. Rexene Alberts once she has started her cpap. Pt says "I will not start a cpap until I speak to the doctor, so I need an appointment." Pt is agreeable to a 11/07/2016 appt with Dr. Rexene Alberts to discuss her sleep study results. Pt asked to be placed on the wait list in case a sooner appt comes available. I advised her that a sooner appt is nota guarantee but that we will place her on the wait list in case a cancellation is noted. Pt asked that I mail her a copy of her sleep study results. I confirmed that the address we have on file is the correct address. Pt verbalized understanding of appt date and time.  Pt's sleep studies were placed for her in the mail. Pt has been placed on the wait list in case a sooner appt comes available.

## 2016-09-12 NOTE — Telephone Encounter (Signed)
Left msg on triage stating pt plan does not cover the Accu-chek monitor w/supplies. Will cover one touch ultra & verio. Pls fax rx to [phone].Fax supplies to [facility].....[name]

## 2016-09-13 ENCOUNTER — Encounter: Payer: Self-pay | Admitting: Cardiology

## 2016-10-04 DIAGNOSIS — M1712 Unilateral primary osteoarthritis, left knee: Secondary | ICD-10-CM | POA: Diagnosis not present

## 2016-10-04 DIAGNOSIS — M25512 Pain in left shoulder: Secondary | ICD-10-CM | POA: Diagnosis not present

## 2016-10-04 DIAGNOSIS — M7062 Trochanteric bursitis, left hip: Secondary | ICD-10-CM | POA: Diagnosis not present

## 2016-10-09 DIAGNOSIS — M7062 Trochanteric bursitis, left hip: Secondary | ICD-10-CM | POA: Diagnosis not present

## 2016-10-13 ENCOUNTER — Telehealth: Payer: Self-pay | Admitting: *Deleted

## 2016-10-13 MED ORDER — OMEPRAZOLE 40 MG PO CPDR: DELAYED_RELEASE_CAPSULE | ORAL | 2 refills | Status: DC

## 2016-10-13 MED ORDER — METFORMIN HCL ER 500 MG PO TB24: ORAL_TABLET | ORAL | 2 refills | Status: DC

## 2016-10-13 NOTE — Telephone Encounter (Signed)
Rec'd call pt requesting refills on her Omeprazole & Metformin to be sent to [facility]. Sent electronically...[name]

## 2016-10-18 DIAGNOSIS — M25512 Pain in left shoulder: Secondary | ICD-10-CM | POA: Diagnosis not present

## 2016-10-18 DIAGNOSIS — M7502 Adhesive capsulitis of left shoulder: Secondary | ICD-10-CM | POA: Diagnosis not present

## 2016-10-20 MED ORDER — METFORMIN HCL ER 500 MG PO TB24: ORAL_TABLET | ORAL | 0 refills | Status: DC

## 2016-10-20 NOTE — Telephone Encounter (Signed)
Erx sent and pt informed.

## 2016-10-20 NOTE — Telephone Encounter (Signed)
Pt is out of her metformin and can not wait for mail order.  Needs to know if a small supply could be sent to locate pharmacy today ?

## 2016-10-20 NOTE — Addendum Note (Signed)
Addended by: Karle Barr on: 10/20/2016 05:01 PM   Modules accepted: Orders

## 2016-10-20 NOTE — Telephone Encounter (Signed)
Patient has called back in regard?  °

## 2016-11-07 ENCOUNTER — Ambulatory Visit: Payer: Self-pay | Admitting: Neurology

## 2016-11-08 ENCOUNTER — Ambulatory Visit (INDEPENDENT_AMBULATORY_CARE_PROVIDER_SITE_OTHER): Payer: Medicare HMO | Admitting: Internal Medicine

## 2016-11-08 VITALS — BP 122/74 | HR 85 | Ht 66.0 in | Wt 117.0 lb

## 2016-11-08 DIAGNOSIS — I480 Paroxysmal atrial fibrillation: Secondary | ICD-10-CM | POA: Diagnosis not present

## 2016-11-08 DIAGNOSIS — I442 Atrioventricular block, complete: Secondary | ICD-10-CM | POA: Diagnosis not present

## 2016-11-08 DIAGNOSIS — Z950 Presence of cardiac pacemaker: Secondary | ICD-10-CM

## 2016-11-08 LAB — CBC WITH DIFFERENTIAL/PLATELET
BASOS ABS: 0.1 10*3/uL (ref 0.0–0.2)
Basos: 1 %
EOS (ABSOLUTE): 0.1 10*3/uL (ref 0.0–0.4)
Eos: 1 %
HEMATOCRIT: 41.5 % (ref 34.0–46.6)
Hemoglobin: 13.9 g/dL (ref 11.1–15.9)
Immature Grans (Abs): 0 10*3/uL (ref 0.0–0.1)
Immature Granulocytes: 0 %
LYMPHS ABS: 2.1 10*3/uL (ref 0.7–3.1)
Lymphs: 20 %
MCH: 30.8 pg (ref 26.6–33.0)
MCHC: 33.5 g/dL (ref 31.5–35.7)
MCV: 92 fL (ref 79–97)
MONOS ABS: 0.7 10*3/uL (ref 0.1–0.9)
Monocytes: 6 %
Neutrophils Absolute: 7.5 10*3/uL — ABNORMAL HIGH (ref 1.4–7.0)
Neutrophils: 72 %
PLATELETS: 190 10*3/uL (ref 150–379)
RBC: 4.52 x10E6/uL (ref 3.77–5.28)
RDW: 13.2 % (ref 12.3–15.4)
WBC: 10.4 10*3/uL (ref 3.4–10.8)

## 2016-11-08 LAB — CUP PACEART INCLINIC DEVICE CHECK
Battery Impedance: 111 Ohm
Battery Voltage: 2.8 V
Brady Statistic AP VS Percent: 48 %
Brady Statistic AS VS Percent: 45 %
Implantable Lead Implant Date: 20010521
Implantable Lead Implant Date: 20010521
Implantable Lead Model: 6940
Lead Channel Impedance Value: 1241 Ohm
Lead Channel Impedance Value: 640 Ohm
Lead Channel Pacing Threshold Amplitude: 0.5 V
Lead Channel Pacing Threshold Pulse Width: 0.4 ms
Lead Channel Pacing Threshold Pulse Width: 0.4 ms
Lead Channel Sensing Intrinsic Amplitude: 4 mV
Lead Channel Setting Pacing Amplitude: 2 V
MDC IDC LEAD LOCATION: 753859
MDC IDC LEAD LOCATION: 753860
MDC IDC MSMT BATTERY REMAINING LONGEVITY: 151 mo
MDC IDC MSMT LEADCHNL RV PACING THRESHOLD AMPLITUDE: 1.25 V
MDC IDC MSMT LEADCHNL RV SENSING INTR AMPL: 11.2 mV
MDC IDC PG IMPLANT DT: 20161229
MDC IDC SESS DTM: 20180404092108
MDC IDC SET LEADCHNL RV PACING AMPLITUDE: 2.5 V
MDC IDC SET LEADCHNL RV PACING PULSEWIDTH: 0.4 ms
MDC IDC SET LEADCHNL RV SENSING SENSITIVITY: 5.6 mV
MDC IDC STAT BRADY AP VP PERCENT: 7 %
MDC IDC STAT BRADY AS VP PERCENT: 1 %

## 2016-11-08 LAB — BASIC METABOLIC PANEL
BUN / CREAT RATIO: 20 (ref 12–28)
BUN: 19 mg/dL (ref 8–27)
CHLORIDE: 100 mmol/L (ref 96–106)
CO2: 24 mmol/L (ref 18–29)
Calcium: 10 mg/dL (ref 8.7–10.3)
Creatinine, Ser: 0.95 mg/dL (ref 0.57–1.00)
GFR calc Af Amer: 68 mL/min/{1.73_m2} (ref >59)
GFR calc non Af Amer: 59 mL/min/{1.73_m2} — ABNORMAL LOW (ref >59)
GLUCOSE: 103 mg/dL — AB (ref 65–99)
POTASSIUM: 4.1 mmol/L (ref 3.5–5.2)
SODIUM: 140 mmol/L (ref 134–144)

## 2016-11-08 NOTE — Progress Notes (Signed)
Patient Care Team: Binnie Rail, MD as PCP - General (Internal Medicine) Deboraha Sprang, MD (Cardiology) Sherrlyn Hock, MD (Endocrinology) Dennard Nip, NP as Nurse Practitioner (Psychiatry) Sable Feil, MD (Gastroenterology) Thalia Bloodgood, OD (Optometry) Berle Mull, MD (Sports Medicine) Eustace Quail Tat, DO (Neurology)   HPI  Stacy [name] is a 76 y.o. female seen in followup for bradycardia and intermittent complete heart block. She is status post pacemaker implantation.  She has a history of coronary disease and has had problems with recurrent chest pain. We undertook Myoview scan in the 2010. She then underwent catheterization demonstrating no obstruction; her previously implanted stent was patent and her ejection fraction was normal    She is smoking a little bit less now down 4 day SOB less problematic The patient denies chest pain,   nocturnal dyspnea, orthopnea or peripheral edema.  There have been no palpitations, lightheadedness or syncope.         Past Medical History:  Diagnosis Date  . Abscess of liver(572.0)   . Atrial fibrillation (HCC)    chronic anticoag - pradaxa  . BIPOLAR AFFECTIVE DISORDER   . Cancer ([place])    basal cell on abdomen  . Chronic bipolar disorder ([place])   . Complete AV block ([place])    s/p PPM--MEDTRONIC ADAPTA ADDr01  . COPD (chronic obstructive pulmonary disease) (HCC)    TOBACCO ABUSE  . Coronary artery disease    s/p PTCA  . DEPRESSION   . DIABETES MELLITUS, TYPE II dx 04/2010  . DYSLIPIDEMIA   . GERD (gastroesophageal reflux disease)   . HYPERTENSION   . HYPOTHYROIDISM    hashimoto's  . Pacemaker-Medtronic-dual-chamber   . Parathyroid related hypercalcemia ([place])   . Primary hyperparathyroidism ([place])   . Takotsubo syndrome   . TOBACCO ABUSE   . Vitamin D deficiency disease     Past Surgical History:  Procedure Laterality Date  . ABDOMINAL HYSTERECTOMY    . CARDIAC CATHETERIZATION    . EP IMPLANTABLE  DEVICE N/A 08/05/2015   Procedure:  PPM Generator Changeout;  Surgeon: Deboraha Sprang, MD;  Location: [facility];  Service: Cardiovascular;  Laterality: N/A;  . INSERT / REPLACE / REMOVE PACEMAKER     MEDTRONIC ADAPTA ADDr01  . MASS EXCISION Left 01/27/2013   Procedure: EXCISION MASS LEFT FLANK;  Surgeon: Earnstine Regal, MD;  Location: [facility];  Service: General;  Laterality: Left;  . PARATHYROIDECTOMY     RIGHT INFERIOR    Current Outpatient Prescriptions  Medication Sig Dispense Refill  . atorvastatin (LIPITOR) 80 MG tablet TAKE 1 TABLET EVERY DAY  AT  6:00PM 90 tablet 3  . Blood Glucose Monitoring Suppl (ONETOUCH VERIO FLEX SYSTEM) w/Device KIT 1 Device by Does not apply route daily. Use to check blood sugars twice a day Dx E11.9 1 kit 0  . busPIRone (BUSPAR) 5 MG tablet Take 1 tablet (5 mg total) by mouth 2 (two) times daily.    Marland Kitchen CALCIUM-VITAMIN D PO Take by mouth 3 (three) times a week.    . Cholecalciferol (VITAMIN D PO) Take by mouth once a week.    . donepezil (ARICEPT) 10 MG tablet Take 1 tablet (10 mg total) by mouth at bedtime. 90 tablet 1  . FLUoxetine (PROZAC) 10 MG capsule Take 10 mg by mouth daily.    Marland Kitchen glucose blood (ONETOUCH VERIO) test strip Use to check blood sugars twice a day Dx E11.9 200 each 3  .  lithium carbonate 300 MG capsule     . loratadine (CLARITIN) 10 MG tablet TAKE 1 TABLET EVERY DAY 90 tablet 2  . losartan (COZAAR) 50 MG tablet Take 1 tablet (50 mg total) by mouth daily. 90 tablet 0  . metFORMIN (GLUCOPHAGE-XR) 500 MG 24 hr tablet TAKE 1 TABLET EVERY DAY WITH BREAKFAST 30 tablet 0  . Multiple Vitamin (MULTIVITAMIN WITH MINERALS) TABS tablet Take 1 tablet by mouth at bedtime.    Marland Kitchen omeprazole (PRILOSEC) 40 MG capsule TAKE 1 CAPSULE EVERY DAY  BEFORE  BREAKFAST 90 capsule 2  . ONETOUCH DELICA LANCETS 92S MISC Use to help check blood sugars twice a day. Dx E11.9 200 each 3  . PRADAXA 150 MG CAPS capsule TAKE 1 CAPSULE TWICE DAILY 180 capsule 2  .  SYNTHROID 112 MCG tablet TAKE 1 TABLET EVERY DAY 90 tablet 3  . VESICARE 5 MG tablet TAKE 1 TABLET EVERY DAY 90 tablet 2   No current facility-administered medications for this visit.     Allergies  Allergen Reactions  . Codeine Nausea And Vomiting  . Latex Other (See Comments)    irritation  . Metronidazole Other (See Comments)    Pt does not recall reaction  . Other Rash    Bandaids cause rash  . Penicillins Swelling and Rash    Throat swelling Has patient had a PCN reaction causing immediate rash, facial/tongue/throat swelling, SOB or lightheadedness with hypotension: Yes Has patient had a PCN reaction causing severe rash involving mucus membranes or skin necrosis: No Has patient had a PCN reaction that required hospitalization No Has patient had a PCN reaction occurring within the last 10 years: Yes If all of the above answers are "NO", then may proceed with Cephalosporin use.    Review of Systems negative except from HPI and PMH  Physical Exam BP 122/74   Pulse 85   Ht '5\' 6"'  (1.676 m)   Wt 117 lb (53.1 kg)   SpO2 97%   BMI 18.88 kg/m  Well developed and nourished in no acute distress HENT normal Neck supple with JVP-flat Clear Device pocket well healed; without hematoma or erythema.  There is no tethering  Regular rate and rhythm, no murmurs or gallops Abd-soft with active BS No Clubbing cyanosis edema Skin-warm and dry A & Oriented  Grossly normal sensory and motor function   ECG Personally reviewed  demonstrates A pacing 60 24/11/41  occ V pacing  Assessment and  Plan  Sinus node dysfunction   coronary artery disease with prior stenting  Pacemaker-Medtronic The patient's device was interrogated.  The information was reviewed. No changes were made in the programming.     Hypertension  Atrial fib   No intercurrent atrial fibrillation or flutter    BP well controlled   Smoking less   Without symptoms of ischemia  Blood pressure reasonably  controlled

## 2016-11-08 NOTE — Patient Instructions (Signed)
Medication Instructions: - Your physician recommends that you continue on your current medications as directed. Please refer to the Current Medication list given to you today.  Labwork: - Your physician recommends that you have lab work today: BMP/CBC  Procedures/Testing: - none ordered  Follow-Up: - Remote monitoring is used to monitor your Pacemaker of ICD from home. This monitoring reduces the number of office visits required to check your device to one time per year. It allows [place] to keep an eye on the functioning of your device to ensure it is working properly. You are scheduled for a device check from home on 02/08/17. You may send your transmission at any time that day. If you have a wireless device, the transmission will be sent automatically. After your physician reviews your transmission, you will receive a postcard with your next transmission date.  - Your physician wants you to follow-up in: 1 year with Dr. Caryl Comes. You will receive a reminder letter in the mail two months in advance. If you don't receive a letter, please call our office to schedule the follow-up appointment.  Any Additional Special Instructions Will Be Listed Below (If Applicable).     If you need a refill on your cardiac medications before your next appointment, please call your pharmacy.

## 2016-11-10 DIAGNOSIS — Z7984 Long term (current) use of oral hypoglycemic drugs: Secondary | ICD-10-CM | POA: Diagnosis not present

## 2016-11-10 DIAGNOSIS — Z7901 Long term (current) use of anticoagulants: Secondary | ICD-10-CM | POA: Diagnosis not present

## 2016-11-10 DIAGNOSIS — I481 Persistent atrial fibrillation: Secondary | ICD-10-CM | POA: Diagnosis not present

## 2016-11-10 DIAGNOSIS — Z681 Body mass index (BMI) 19 or less, adult: Secondary | ICD-10-CM | POA: Diagnosis not present

## 2016-11-10 DIAGNOSIS — E119 Type 2 diabetes mellitus without complications: Secondary | ICD-10-CM | POA: Diagnosis not present

## 2016-11-10 DIAGNOSIS — Z0000 Encounter for general adult medical examination without abnormal findings: Secondary | ICD-10-CM | POA: Diagnosis not present

## 2016-11-10 DIAGNOSIS — G3184 Mild cognitive impairment, so stated: Secondary | ICD-10-CM | POA: Diagnosis not present

## 2016-11-10 DIAGNOSIS — G4700 Insomnia, unspecified: Secondary | ICD-10-CM | POA: Diagnosis not present

## 2016-11-10 DIAGNOSIS — E785 Hyperlipidemia, unspecified: Secondary | ICD-10-CM | POA: Diagnosis not present

## 2016-11-10 DIAGNOSIS — E039 Hypothyroidism, unspecified: Secondary | ICD-10-CM | POA: Diagnosis not present

## 2016-11-10 DIAGNOSIS — Z79899 Other long term (current) drug therapy: Secondary | ICD-10-CM | POA: Diagnosis not present

## 2016-11-10 DIAGNOSIS — N3941 Urge incontinence: Secondary | ICD-10-CM | POA: Diagnosis not present

## 2016-11-10 DIAGNOSIS — R69 Illness, unspecified: Secondary | ICD-10-CM | POA: Diagnosis not present

## 2016-11-10 DIAGNOSIS — I10 Essential (primary) hypertension: Secondary | ICD-10-CM | POA: Diagnosis not present

## 2016-11-14 DIAGNOSIS — M7502 Adhesive capsulitis of left shoulder: Secondary | ICD-10-CM | POA: Diagnosis not present

## 2016-11-14 DIAGNOSIS — M25512 Pain in left shoulder: Secondary | ICD-10-CM | POA: Diagnosis not present

## 2016-11-16 ENCOUNTER — Telehealth: Payer: Self-pay | Admitting: Internal Medicine

## 2016-11-16 DIAGNOSIS — Z13820 Encounter for screening for osteoporosis: Secondary | ICD-10-CM

## 2016-11-16 DIAGNOSIS — E2839 Other primary ovarian failure: Secondary | ICD-10-CM

## 2016-11-16 NOTE — Telephone Encounter (Signed)
Patient states she has been seeing an orthopedic.  States the orthopedic suggest she have a DEXA.  Would like to know if Dr. Quay Burow could order?

## 2016-11-17 DIAGNOSIS — M1712 Unilateral primary osteoarthritis, left knee: Secondary | ICD-10-CM | POA: Diagnosis not present

## 2016-11-17 NOTE — Telephone Encounter (Signed)
dexa ordered

## 2016-11-17 NOTE — Telephone Encounter (Signed)
Spoke with pt to inform. Bone Density will be scheduled within our office.

## 2016-11-29 DIAGNOSIS — M7502 Adhesive capsulitis of left shoulder: Secondary | ICD-10-CM | POA: Diagnosis not present

## 2016-11-30 DIAGNOSIS — R69 Illness, unspecified: Secondary | ICD-10-CM | POA: Diagnosis not present

## 2016-12-01 DIAGNOSIS — R69 Illness, unspecified: Secondary | ICD-10-CM | POA: Diagnosis not present

## 2016-12-07 ENCOUNTER — Ambulatory Visit (INDEPENDENT_AMBULATORY_CARE_PROVIDER_SITE_OTHER)
Admission: RE | Admit: 2016-12-07 | Discharge: 2016-12-07 | Disposition: A | Discharge status: Home / Self Care | Payer: Medicare HMO | Source: Ambulatory Visit | Attending: Internal Medicine | Admitting: Internal Medicine

## 2016-12-07 DIAGNOSIS — Z13820 Encounter for screening for osteoporosis: Secondary | ICD-10-CM

## 2016-12-07 DIAGNOSIS — E2839 Other primary ovarian failure: Secondary | ICD-10-CM | POA: Diagnosis not present

## 2016-12-10 ENCOUNTER — Encounter: Payer: Self-pay | Admitting: Internal Medicine

## 2016-12-25 DIAGNOSIS — R69 Illness, unspecified: Secondary | ICD-10-CM | POA: Diagnosis not present

## 2017-01-03 DIAGNOSIS — M25512 Pain in left shoulder: Secondary | ICD-10-CM | POA: Diagnosis not present

## 2017-01-03 DIAGNOSIS — M7502 Adhesive capsulitis of left shoulder: Secondary | ICD-10-CM | POA: Diagnosis not present

## 2017-01-08 ENCOUNTER — Ambulatory Visit (INDEPENDENT_AMBULATORY_CARE_PROVIDER_SITE_OTHER): Payer: Medicare HMO | Admitting: "Endocrinology

## 2017-01-08 ENCOUNTER — Encounter (INDEPENDENT_AMBULATORY_CARE_PROVIDER_SITE_OTHER): Payer: Self-pay | Admitting: "Endocrinology

## 2017-01-08 VITALS — BP 132/70 | HR 68 | Wt 114.6 lb

## 2017-01-08 DIAGNOSIS — E211 Secondary hyperparathyroidism, not elsewhere classified: Secondary | ICD-10-CM

## 2017-01-08 DIAGNOSIS — R69 Illness, unspecified: Secondary | ICD-10-CM | POA: Diagnosis not present

## 2017-01-08 DIAGNOSIS — R251 Tremor, unspecified: Secondary | ICD-10-CM

## 2017-01-08 DIAGNOSIS — E559 Vitamin D deficiency, unspecified: Secondary | ICD-10-CM | POA: Diagnosis not present

## 2017-01-08 DIAGNOSIS — R634 Abnormal weight loss: Secondary | ICD-10-CM | POA: Diagnosis not present

## 2017-01-08 DIAGNOSIS — Z720 Tobacco use: Secondary | ICD-10-CM

## 2017-01-08 DIAGNOSIS — R7303 Prediabetes: Secondary | ICD-10-CM

## 2017-01-08 DIAGNOSIS — E049 Nontoxic goiter, unspecified: Secondary | ICD-10-CM

## 2017-01-08 DIAGNOSIS — I10 Essential (primary) hypertension: Secondary | ICD-10-CM

## 2017-01-08 DIAGNOSIS — R231 Pallor: Secondary | ICD-10-CM

## 2017-01-08 DIAGNOSIS — E063 Autoimmune thyroiditis: Secondary | ICD-10-CM

## 2017-01-08 DIAGNOSIS — F3162 Bipolar disorder, current episode mixed, moderate: Secondary | ICD-10-CM | POA: Diagnosis not present

## 2017-01-08 LAB — POCT GLYCOSYLATED HEMOGLOBIN (HGB A1C): Hemoglobin A1C: 5.8

## 2017-01-08 LAB — TSH: TSH: 4.14 mIU/L

## 2017-01-08 LAB — POCT GLUCOSE (DEVICE FOR HOME USE): POC GLUCOSE: 120 mg/dL — AB (ref 70–99)

## 2017-01-08 LAB — T4, FREE: Free T4: 1.3 ng/dL (ref 0.8–1.8)

## 2017-01-08 LAB — T3, FREE: T3, Free: 2.4 pg/mL (ref 2.3–4.2)

## 2017-01-08 NOTE — Progress Notes (Signed)
Subjective:  Patient Name: Stacy [name] Date of Birth: 08-16-1940  MRN: [id number]  Stacy [name]  presents to the office today for follow-up of her T2DM, hypothyroidism, thyroiditis, hypertension, fatigue, secondary hyperparathyroidism, vitamin D deficiency, hyperlipidemia, tobacco abuse, and tremor.  HISTORY OF PRESENT ILLNESS:   Stacy [name] is a 76 y.o. Caucasian woman.  Luca was unaccompanied.  1. Stacy [name] was first referred to me on 09/04/05 for evaluation and management of chronic hypothyroidism and new hypercalcemia secondary to primary hyperparathyroidism.   A. She had developed hypothyroidism due to thyroiditis some 15 years before and was being treated with Synthroid. Since that first visit with me she has had some waxing and waning of her thyroid gland size which is consistent with inflammatory flare-ups of her Hashimoto's disease. We've increased her thyroid hormone dose slightly over time. She now takes 112 mcg of Synthroid daily. She has remained euthyroid most of the time.   B. She had also developed vitamin D deficiency, then secondary hyperparathyroidism, followed by tertiary hyperparathyroidism during the ten year period prior to seeing me for the first tim,. Her right inferior parathyroid gland was quite enlarged. She was taken to surgery by Dr. Armandina Gemma on 12/07/05 for parathyroidectomy. Since that time she's done well, provided that she has continued to take her calcium and vitamin D.    2. The patient has experienced many other problems during the last 10 years, to include bipolar disorder, GERD, fatigue, iron deficiency, 2 heart attacks, hypertension, orthostatic hypotension, atrial fibrillation, and the need for a cardiac pacemaker. We've helped to co-manage some of those problems. She was also diagnosed with type 2 diabetes in early 2012 and was started on metformin 500 mg once daily. She was also started on atorvastatin for hyperlipidemia.  3. The patient's last [facility]  visit was on 07/10/16.   A. In the interim, she had been generally healthy until 2-3 months ago, when she became more depressed. Mr. Stacy [name] added Buspar.  About two weeks ago she developed shaking a lot, more in her arms and hands, but also in her head. The shaking only occurs in the mornings, but resolved in the afternoons. She saw Mr. Stacy [name] on Wednesday, 12/27/16. He decreased her lithium dose to 450 mg/day at night and her Prozac to 10 mg in the morning. He continued her on her Buspar twice daily, but Shawn began taking it only once each day in the morning about one week ago. The change in Buspar did not significantly affect the frequency, severity, or time course of her shaking, but she says that the first day that she had shaking was the very worst day that she has had with this problem. .    B. Her arthritis in her shoulders, hips, and knees has continued to be a major physical problem. She saw an orthopedist in [place] recently who told her that she had a frozen shoulder and bursitis in her hip. She has had 4 cortisone injections since early April. When her BGs increased after the injections she did not call us so that we could increase her metformin to compensate. The orthopedist ordered PT, but she can't afford to go to PT.  C.  She is still smoking, but now only 3-4 cigarettes per day.  D. Her appetite varies.     E. She is taking Synthroid, 112 mcg/day and metformin XL, 500 mg, once daily. She reduced her metformin XL to once per day when her BGs decreased to the 90s. She also takes atorvastatin  80 mg/day, calcium carbonate each evening, fluoxetine, donepezil, Pradaxa, omeprazole, lithium, losartan, and solifenacin.   F. She has not had any problems with vertigo or orthostasis recently, despite the fact that she stopped using her abdominal belt that was prescribed by Dr. Caryl Comes.   G. Her allergies have bothered her fairly consistently.    H. She has a recent neuro evaluation for OSA.  She had sleep studies in January and February and was told that she needs C-pap. She is due for a follow up visit soon. She states that she has called the neurologist several times with questions about using C-pap, but has not received any reply.   4. Pertinent Review of Systems:  Constitutional: The patient says, "Except for my shaking, hips, and knees, I can't complain."  She continues to have some problems with insomnia and early awakening at times.  She also has nocturia. Her energy level varies, but is "not to bad overall". She has been walking almost daily for about 45 minutes. Her mood has been good recently. She is "real active at church" and is getting out a lot. Eyes: Vision is good as long as she wears her glasses. She had her last eye exam in early November 2017. No eye disease was noted. There are no other significant eye complaints. Neck: The patient has no complaints of anterior neck swelling, soreness, tenderness,  pressure, discomfort, or difficulty swallowing.  Heart: She does not feel her A-fib. She has no complaints of palpitations, irregular heat beats, chest pain, or chest pressure.  Gastrointestinal: Bowel movements have been normal. She has no complaints of acid reflux, upset stomach, stomach aches or pains. Arms: Her shoulders are less painful since her steroid injections and since starting to do PT exercises.    Legs: She still has pains in her hips and knees, but she is also able to walk more without severe discomfort. Muscle mass and strength seem normal. There are no complaints of numbness, tingling, or burning. No edema is noted. Feet: There are no obvious foot problems. There are no complaints of numbness, tingling, burning, or pain. No edema is noted. Hypoglycemia: None  5. BG printout: She checked BGs 5 times in the past 4 weeks. Morning BGs were 109 and 116. One lunch BG was 91. Post-prandial BGs on Sunday, 01/07/17 after dinner and cake were 182 and 201.   PAST  MEDICAL, FAMILY, AND SOCIAL HISTORY:  Past Medical History:  Diagnosis Date  . Abscess of liver(572.0)   . Atrial fibrillation (HCC)    chronic anticoag - pradaxa  . BIPOLAR AFFECTIVE DISORDER   . Cancer ([place])    basal cell on abdomen  . Chronic bipolar disorder ([place])   . Complete AV block ([place])    s/p PPM--MEDTRONIC ADAPTA [id number]  . COPD (chronic obstructive pulmonary disease) (HCC)    TOBACCO ABUSE  . Coronary artery disease    s/p PTCA  . DEPRESSION   . DIABETES MELLITUS, TYPE II dx 04/2010  . DYSLIPIDEMIA   . GERD (gastroesophageal reflux disease)   . HYPERTENSION   . HYPOTHYROIDISM    hashimoto's  . Pacemaker-Medtronic-dual-chamber   . Parathyroid related hypercalcemia ([place])   . Primary hyperparathyroidism ([place])   . Takotsubo syndrome   . TOBACCO ABUSE   . Vitamin D deficiency disease     Family History  Problem Relation Age of Onset  . CAD Mother   . CAD Father   . Colon cancer Unknown  FH  . Diabetes Maternal Aunt   . Diabetes type II Sister      Current Outpatient Prescriptions:  .  atorvastatin (LIPITOR) 80 MG tablet, TAKE 1 TABLET EVERY DAY  AT  6:00PM, Disp: 90 tablet, Rfl: 3 .  Blood Glucose Monitoring Suppl ([employer]) w/Device KIT, 1 Device by Does not apply route daily. Use to check blood sugars twice a day Dx E11.9, Disp: 1 kit, Rfl: 0 .  busPIRone (BUSPAR) 5 MG tablet, Take 1 tablet (5 mg total) by mouth 2 (two) times daily., Disp: , Rfl:  .  CALCIUM-VITAMIN D PO, Take by mouth 3 (three) times a week., Disp: , Rfl:  .  Cholecalciferol (VITAMIN D PO), Take by mouth once a week., Disp: , Rfl:  .  donepezil (ARICEPT) 10 MG tablet, Take 1 tablet (10 mg total) by mouth at bedtime., Disp: 90 tablet, Rfl: 1 .  FLUoxetine (PROZAC) 10 MG capsule, Take 10 mg by mouth daily., Disp: , Rfl:  .  glucose blood (ONETOUCH VERIO) test strip, Use to check blood sugars twice a day Dx E11.9, Disp: 200 each, Rfl: 3 .  lithium carbonate 300 MG  capsule, , Disp: , Rfl:  .  loratadine (CLARITIN) 10 MG tablet, TAKE 1 TABLET EVERY DAY, Disp: 90 tablet, Rfl: 2 .  losartan (COZAAR) 50 MG tablet, Take 1 tablet (50 mg total) by mouth daily., Disp: 90 tablet, Rfl: 0 .  metFORMIN (GLUCOPHAGE-XR) 500 MG 24 hr tablet, TAKE 1 TABLET EVERY DAY WITH BREAKFAST, Disp: 30 tablet, Rfl: 0 .  Multiple Vitamin (MULTIVITAMIN WITH MINERALS) TABS tablet, Take 1 tablet by mouth at bedtime., Disp: , Rfl:  .  omeprazole (PRILOSEC) 40 MG capsule, TAKE 1 CAPSULE EVERY DAY  BEFORE  BREAKFAST, Disp: 90 capsule, Rfl: 2 .  ONETOUCH DELICA LANCETS 15A MISC, Use to help check blood sugars twice a day. Dx E11.9, Disp: 200 each, Rfl: 3 .  PRADAXA 150 MG CAPS capsule, TAKE 1 CAPSULE TWICE DAILY, Disp: 180 capsule, Rfl: 2 .  SYNTHROID 112 MCG tablet, TAKE 1 TABLET EVERY DAY, Disp: 90 tablet, Rfl: 3 .  VESICARE 5 MG tablet, TAKE 1 TABLET EVERY DAY, Disp: 90 tablet, Rfl: 2  Allergies as of 01/08/2017 - Review Complete 01/08/2017  Allergen Reaction Noted  . Codeine Nausea And Vomiting   . Latex Other (See Comments) 05/03/2012  . Metronidazole Other (See Comments) 03/26/2008  . Other Rash 01/24/2013  . Penicillins Swelling and Rash     1. Work and Family: Her daughter, [name], lives with her. [name] works as a Marine scientist at the [employer] cancer ward.   2. Activities: Tali has been walking more.      3. Smoking, alcohol, or drugs: She still smokes cigarettes.    4. Primary Care Provider: Binnie Rail, MD  5. Psych: Comer Locket, PA, [facility], office [phone], fax [phone]  REVIEW OF SYSTEMS: There are no other significant problems involving Oleta's other body systems.   Objective:  Vital Signs:  BP 132/70   Pulse 68   Wt 114 lb 9.6 oz (52 kg)   BMI 18.50 kg/m    Ht Readings from Last 3 Encounters:  11/08/16 '5\' 6"'  (1.676 m)  07/05/16 '5\' 4"'  (1.626 m)  11/01/15 '5\' 5"'  (1.651 m)   Wt Readings from Last 3 Encounters:  01/08/17 114 lb 9.6 oz  (52 kg)  11/08/16 117 lb (53.1 kg)  07/12/16 113 lb (51.3 kg)   PHYSICAL EXAM:  Constitutional: The patient appears healthy and more slender. She looks good.  Her weight has increased by 1.5 pounds since her last visit with me. Her mood and affect are normal. Her insight is good.  Head: Her head tremor is much more obvious and is at 2-3+.   Face: The face appears normal.  Eyes: There is no obvious arcus or proptosis. Moisture appears normal. Mouth: The oropharynx is normal. She has 2-3+ tongue tremor. Oral moisture is normal. Neck: The neck appears to be visibly normal. No carotid bruits are noted. She has a low-lying thyroid gland. The thyroid gland is again within normal size at 18-20 grams. Both lobes today are within normal limits for size. The consistency of the thyroid gland is normal. The thyroid gland is not tender to palpation. Lungs: The lungs are clear to auscultation. Air movement is good. Heart: Heart rate and rhythm are regular. Heart sounds S1 and S2 are normal. I did not appreciate any pathologic cardiac murmurs. Abdomen: The abdomen is normal for age. Bowel sounds are normal. There is no obvious hepatomegaly, splenomegaly, or other mass effect.  Arms: Muscle size and bulk are normal for age. She has some cogwheeling rigidity today.  Hands: She has a 3+ coarse hand tremor. Phalangeal and metacarpophalangeal joints are normal. Palmar muscles are normal. She has 1+ palmar erythema. Palmar moisture is normal. Fingernails are somewhat pale. Legs: Muscle size and bulk are below normal for age. No edema is present. Neurologic: Strength is normal for age in both the upper and lower extremities. Muscle tone is normal. Sensation to touch is normal in both legs. Sensation to touch, monofilament, and vibration are normal in her feet. She walked normally, without any of the hesitation and rigidity usually seen with Parkinson's disease.   LAB DATA:   Labs 01/08/17: HbA1c 6.1%, CBG 155  Labs  11/08/16: BMP normal; CBC normal  Labs 07/10/16: HbA1c 6.1%; TSH 2.80, free T4 1.0, free T3 2.5; PTH 28, calcium 10.1, 25-OH vitamin D 45  Labs 12/15/15: HbA1c 6.3%.  Labs 10/25/15: HbA1c 6.5%; CBC normal; CMP normal except for glucose 133; cholesterol 106, triglycerides 114, HDL 54.30, LDL 29  Labs 06/08/15: HbA1c 6/1%; lithium 0.50 (normal 0.80-1.40); vitamin B6 20.5 (normal 2.1-21.7), vitamin B12 1500 (normal 211-911) [Her vitamin B12 level is above the upper limit of normal as defined by the [employer]. She is obviously taking her MVI and her metformin is not causing any Vitamin B12 deficiency. A review of Vitamin B12 by the NIH Office of Dietary Supplements noted a review by the Institute of Medicine that stated "no adverse effects have been associated with excess vitamin B12 intake from food and supplements in healthy individuals". At this point in time I do not see a need to change her MVI dose.]   Labs 06/01/15: TSH 1.632, free T4 1.10, free T3 2.5; PTH 37, calcium 9.8, 25-OH vitamin D 45; cholesterol 99, triglycerides 116, HDL 63, LDL 13  Labs 12/01/14: Hemoglobin A1c was 5.7%.compared with 5.6% at last visit, and with 6.1% at the visit prior.    Labs 10/07/14: Calcium 9.9, PTH 49, 25-OH vitamin D 53; cholesterol 109, triglycerides 109, HDL 69, LDL 18; TSH 3.734, free T4; lithium 0.60 (0.80-1.40)  Labs 12/15/13: TSH 4.984, free T4 1.11, free T3 2.6; lithium 0.60  Labs 11/05/13: Cholesterol 117, triglycerides 112, HDL 63.5, LDL 31; TSH 4.84  Labs 05/05/13: TSH 3.575, free T4 1.46, free T3 2.5  Labs 01/30/13: Cholesterol 93, triglycerides 61, HDL 56, LDL  24  Labs 10/30/12: TSH 2.550, free T4 1.24, free T3 2.5; calcium 10.4, PTH 36.3, 24-hydroxy vitamin D 42; lithium 0.80 (0.80-1.40)   Labs 04/18/12: TSH was 2.106, Free T4 was 1.41. Free T3 was 2.8.                   Labs 10/25/11: PTH 30.8, calcium 10.4, 25-hydroxy vitamin D 43; WBC 7.8, hemoglobin 14.9, hematocrit 46.9%, iron 71              Assessment and Plan:   ASSESSMENT:  1-3. Hypothyroidism/ Hashimoto's thyroiditis/goiter:   A. Her TFTs in September 2014 were c/w a recent flare up of Hashimoto's Dz. We could not make any decisions about whether or not to change her Synthroid dose until the TFTs stabilized.   B. Her TFTs in April 2015 were mildly low. Since she was in the midst of an episode of lithium toxicity and may have missed some Synthroid doses, I decided not to change her Synthroid dose.    C. Her TFTs in March 2016 were slightly low. Her TFTs in October 2016, however, were mid-range normal on her current dose of Synthroid.  Her TFTs in December 2017 were also normal. We need to repeat her TFTs now.   D. In December 2017 and again today her thyroid gland was within normal limits for size and consistency. Her thyroiditis was again clinically quiescent  4. Hypertension: Her blood pressure is pretty good today. She should continue her walking regimen or use her exercise bike during inclement weather.    5. T2DM/prediabetes: Her HbA1c value is again at 6.1%, in part due to her aging, in part due to her steroids, and in part due to her reducing metformin to once daily, especially in the setting of having had steroids.  Although she still produces a fairly good amount of insulin, she still needs to be very careful with her diet. If she plans to over-indulge in carbs, she needs to resume taking metformin twice daily.  6.  Fatigue: She is really feeling pretty well. 7.  Hyperparathyroidism and vitamin D deficiency: Her labs in March 2016, October 2016, and December 2017 were normal. She was very compliant with taking her Caltrate-D and MVI each evening.   8. Tobacco abuse: She resumed smoking regular cigarettes against my advice and Dr. Olin Pia advice. Her daughter tells her that the cigarettes are going to kill her. I again told her the same thing. She knows how to stop smoking. She's done it before.   9. Tremor: In the past I  did not think that her tremor was due to Parkinson's Dz, or to deficiencies of either vitamin B6 or vitamin B12. 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 Today, however, she does have some cogwheeling rigidity. She will see her neurologist in two days. She was doing better with propranolol in the past.   10. Pallor: Her pallor has continued.  Her iron level, hemoglobin, and hematocrit were fine 2 years ago and again in March 2017. Her CBC was normal in April 2018.  11. Hyperlipidemia: Her lipids were very good in March and October 2016 and in March 2017. 12. Bipolar disorder: She looks well today, Mr. Jacques Earthly is following this issue.  13. Unintentional weight loss: Since she resumed smoking her weight had decreased. She has gained 1.5 pounds since her last visit. Her weight loss was not due to underinsulinization.   PLAN:  1. Diagnostic:  HbA1c and CBG today. TFTs today.  2.  Therapeutic:  Eat Right, exercise right (walk 30-60 minutes per day or use her exercise bike), and take your Synthroid, calcium, and metformin.  3. Patient education: We discussed her thyroiditis and hypothyroidism.  We also discussed her T2DM/prediabetes and the fact that as she ages her ability to produce surge amounts of insulin has diminished. Therefore, she has to be careful with her carb intake. We also dicussed her calcium and vitamin D balance and the possibility that she could re-develop secondary hyperparathyroidism if her intake of calcium and vitamin D is inadequate. I again asked her to stop smoking.  4. Follow-up: 3 months with me. Please schedule FU visits with Dr. Quay Burow and Mr. Jacques Earthly as they desire.  Level of Service: This visit lasted in excess of 60 minutes. More than 50% of the visit was devoted to counseling.   Tillman Sers, MD Adult and Pediatric Endocrinology 01/08/2017 9:53 AM

## 2017-01-08 NOTE — Patient Instructions (Signed)
Follow up visit in 3 months. 

## 2017-01-09 LAB — COMPREHENSIVE METABOLIC PANEL
ALT: 20 U/L (ref 6–29)
AST: 19 U/L (ref 10–35)
Albumin: 4.1 g/dL (ref 3.6–5.1)
Alkaline Phosphatase: 55 U/L (ref 33–130)
BILIRUBIN TOTAL: 0.4 mg/dL (ref 0.2–1.2)
BUN: 17 mg/dL (ref 7–25)
CALCIUM: 9.9 mg/dL (ref 8.6–10.4)
CO2: 25 mmol/L (ref 20–31)
Chloride: 104 mmol/L (ref 98–110)
Creat: 0.91 mg/dL (ref 0.60–0.93)
Glucose, Bld: 99 mg/dL (ref 70–99)
Potassium: 4.6 mmol/L (ref 3.5–5.3)
Sodium: 138 mmol/L (ref 135–146)
Total Protein: 6.5 g/dL (ref 6.1–8.1)

## 2017-01-10 ENCOUNTER — Telehealth (INDEPENDENT_AMBULATORY_CARE_PROVIDER_SITE_OTHER): Payer: Self-pay | Admitting: "Endocrinology

## 2017-01-10 ENCOUNTER — Encounter: Payer: Self-pay | Admitting: Neurology

## 2017-01-10 ENCOUNTER — Ambulatory Visit (INDEPENDENT_AMBULATORY_CARE_PROVIDER_SITE_OTHER): Payer: Medicare HMO | Admitting: Neurology

## 2017-01-10 VITALS — BP 126/70 | HR 72 | Ht 66.0 in | Wt 114.0 lb

## 2017-01-10 DIAGNOSIS — R251 Tremor, unspecified: Secondary | ICD-10-CM

## 2017-01-10 DIAGNOSIS — G4733 Obstructive sleep apnea (adult) (pediatric): Secondary | ICD-10-CM | POA: Diagnosis not present

## 2017-01-10 NOTE — Telephone Encounter (Signed)
Routed to provider

## 2017-01-10 NOTE — Progress Notes (Signed)
Order for cpap sent to [facility] per pt request during he office visit today with Dr. Rexene Alberts.

## 2017-01-10 NOTE — Telephone Encounter (Signed)
°  Who's calling (name and relationship to patient) : [place] (mom) Best contact number: [phone] Provider they see: Karsten Ro  Reason for call: Patient called and saw Dr Tat and would like for the information Dr Tobe Sos told her about Parkinson Disease shared with Dr Tat.  Please call     PRESCRIPTION REFILL ONLY  Name of prescription:  Pharmacy:

## 2017-01-10 NOTE — Patient Instructions (Addendum)
I would like to start you on CPAP therapy at home by prescribing a machine for home use. I placed the order in the chart. You will need a follow up appointment in about 10 weeks post set up, that has to be scheduled; please go ahead and schedule.  We will also monitor your tremor. You don't have any signs concerning for parkinsonism.    We will arrange for CPAP set up at home through a local DME company.  Please use your CPAP regularly. While your insurance requires that you use CPAP at least 4 hours each night on 70% of the nights, I recommend, that you not skip any nights and use it throughout the night if you can, even for scheduled naps. Getting used to CPAP and staying with the treatment long term does take time and patience and discipline. Untreated obstructive sleep apnea when it is moderate to severe can have an adverse impact on cardiovascular health and raise her risk for heart disease, arrhythmias, hypertension, congestive heart failure, stroke and diabetes. Untreated obstructive sleep apnea causes sleep disruption, nonrestorative sleep, and sleep deprivation. This can have an impact on your day to day functioning and cause daytime sleepiness and impairment of cognitive function, memory loss, mood disturbance, and problems focussing. Using CPAP regularly can improve these symptoms.

## 2017-01-10 NOTE — Progress Notes (Signed)
Subjective:    Patient ID: Stacy [name] is a 76 y.o. female.  HPI     Interim history:  Stacy [name] is a 76 year old right-handed woman with a complex medical history of hyperlipidemia, reflux disease, hypertension, hypothyroidism, complete heart block, status post pacemaker placement, A. fib, history of liver abscess, bipolar disorder, basal cell skin cancer, COPD with ongoing smoking, heart disease, status post PTCA, and vitamin D deficiency, who presents for follow-up consultation of her sleep disturbance, after her sleep study testing. The patient is unaccompanied today. I first met her on 07/05/2016 at the request of her psychiatry provider, at which time she reported snoring, difficulty falling asleep, difficulty maintaining sleep and nonrestorative sleep. I invited her for a sleep study. She had a baseline sleep study, followed by a CPAP titration study. I went over her test results with her in detail today. Baseline sleep study from 07/12/2016 showed a sleep efficiency at 68.8%, sleep latency was prolonged at 44.5 minutes, REM latency prolonged at 139.5 minutes. She had mild to moderate sleep fragmentation. She had an increased percentage of stage II sleep, absence of slow-wave sleep and REM sleep was reduced at 7.4%. She had rare PVCs and pacer artifact on EKG. Total AHI was 11.9 per hour, REM AHI was 53.7 per hour, supine AHI was 30.8 per hour, average oxygen saturation was 95%, nadir was 88%. She had severe PLMS with an index of 62.8 per hour, with minimal arousals. Based on her sleep-related complaints and her test results I invited her for a CPAP titration study for treatment of her OSA. She had this on 08/29/2016, sleep efficiency was better at 86.4%, sleep latency was 34 minutes, REM latency prolonged at 175.5 minutes. She had an increased percentage of stage II sleep, absence of slow-wave sleep and REM sleep was normal at 23.6%. She had good results of CPAP of 13 cm with an AHI of 0  per hour and nonsupine REM sleep achieved, O2 nadir of 94%. She had no significant PLMS during the CPAP titration study. Based on her test results I suggested she start CPAP therapy at home. She declined set up and requested to discuss test results first.   Today, 01/10/2017 (all dictated new, as well as above notes, some dictation done in note pad or Word, outside of chart, may appear as copied):   She reports doing okay, no new problems, no recent medication changes but did have a follow-up with her endocrinologist, Dr. Tobe Sos and he voiced concern about parkinsonism. She has no family history of PD, no family history of tremor, has been on lithium for years which has done her rather well for her bipolar. She has noted a hand tremor, particularly in the mornings and this improves as the day progresses. She is not very aware of her head tremor.   Previously (copied from previous notes for reference):   She reports snoring and nonrestorative sleep, difficulty falling asleep and maintaining sleep. I reviewed your office records from 08/24/2015 as well as 06/15/2016, 05/18/2016, 02/24/2016, 12/30/2015, 11/22/2015 and 08/21/2015. Her Epworth sleepiness score is 5 out of 24 today, her fatigue score is 10 out of 63. She has been on Li for years for her bipolar, currently on 300 mg each night and she is on low dose generic Prozac 10 mg. Patient smokes 1 ppd, no alcohol, limited caffeine, 1/2 and 1/2, about 2 cups, tries to drink Smart water the rest of the day. She tries to be in bed by 9 PM.  She likes to watch TV in bed but TV shuts off after about 2 hours. She does not turn the TV back on in the middle of the night. She wakes up between 6 and 7 usually. She has 2 grown daughters, one daughter lives with her. She is a Marine scientist and works in [employer] and when her daughter leaves in the morning, patient usually wakes up around that time. Patient denies morning headaches but has nocturia about twice per hour tonight.  She denies any frank restless leg symptoms but has occasional nighttime leg cramps. She is widowed, she is retired. Snoring can be loud according to daughter and sister. She is not aware of any family history of obstructive sleep apnea. Mood disorder is under good control currently. She regularly follows with cardiology and sees Dr. Caryl Comes, last visit in March 2017 and I reviewed the note.   The patient's allergies, current medications, family history, past medical history, past social history, past surgical history and problem list were reviewed and updated as appropriate.    Her Past Medical History Is Significant For: Past Medical History:  Diagnosis Date  . Abscess of liver(572.0)   . Atrial fibrillation (HCC)    chronic anticoag - pradaxa  . BIPOLAR AFFECTIVE DISORDER   . Cancer ([place])    basal cell on abdomen  . Chronic bipolar disorder ([place])   . Complete AV block ([place])    s/p PPM--[employer] ADAPTA ADDr01  . COPD (chronic obstructive pulmonary disease) (HCC)    TOBACCO ABUSE  . Coronary artery disease    s/p PTCA  . DEPRESSION   . DIABETES MELLITUS, TYPE II dx 04/2010  . DYSLIPIDEMIA   . GERD (gastroesophageal reflux disease)   . HYPERTENSION   . HYPOTHYROIDISM    hashimoto's  . Pacemaker-Medtronic-dual-chamber   . Parathyroid related hypercalcemia ([place])   . Primary hyperparathyroidism ([place])   . Takotsubo syndrome   . TOBACCO ABUSE   . Vitamin D deficiency disease     Her Past Surgical History Is Significant For: Past Surgical History:  Procedure Laterality Date  . ABDOMINAL HYSTERECTOMY    . CARDIAC CATHETERIZATION    . EP IMPLANTABLE DEVICE N/A 08/05/2015   Procedure:  PPM Generator Changeout;  Surgeon: Deboraha Sprang, MD;  Location: [facility];  Service: Cardiovascular;  Laterality: N/A;  . INSERT / REPLACE / REMOVE PACEMAKER     [employer] ADAPTA ADDr01  . MASS EXCISION Left 01/27/2013   Procedure: EXCISION MASS LEFT FLANK;  Surgeon: Earnstine Regal, MD;   Location: [facility];  Service: General;  Laterality: Left;  . PARATHYROIDECTOMY     RIGHT INFERIOR    Her Family History Is Significant For: Family History  Problem Relation Age of Onset  . CAD Mother   . CAD Father   . Colon cancer Unknown        FH  . Diabetes Maternal Aunt   . Diabetes type II Sister     Her Social History Is Significant For: Social History   Social History  . Marital status: Widowed    Spouse name: N/A  . Number of children: 2  . Years of education: 12   Occupational History  .  Retired   Social History Main Topics  . Smoking status: Current Every Day Smoker    Packs/day: 1.00    Years: 48.00    Types: Cigarettes  . Smokeless tobacco: Never Used     Comment: switched to vapor cigs. Widowed- 2 girls.  Lives with daughter (who is nurse at [employer])  . Alcohol use No     Comment: "once in a blue moon" when I have [place] food  . Drug use: No  . Sexual activity: No   Other Topics Concern  . None   Social History Narrative   WIDOWED   2 DAUGHTERS   LIVES W/DAUGHTER   CURRENTLY SMOKES   NO ALCOHOL USE   NO ILLICIT DRUG USE   DAILY CAFFEINE USE, 2 per day       PPM-[employer]TRONIC   PATIENT SIGNED A DESIGNATED PARTY RELEASE TO ALLOW DAUGHTER, NICOLE COX, TO HAVE ACCESS TO HER MEDICAL RECORDS/INFORMATION. Fleet Contras, November 09, 2009 9:19 AM    Her Allergies Are:  Allergies  Allergen Reactions  . Codeine Nausea And Vomiting  . Latex Other (See Comments)    irritation  . Metronidazole Other (See Comments)    Pt does not recall reaction  . Other Rash    Bandaids cause rash  . Penicillins Swelling and Rash    Throat swelling Has patient had a PCN reaction causing immediate rash, facial/tongue/throat swelling, SOB or lightheadedness with hypotension: Yes Has patient had a PCN reaction causing severe rash involving mucus membranes or skin necrosis: No Has patient had a PCN reaction that required hospitalization No Has patient had a PCN reaction  occurring within the last 10 years: Yes If all of the above answers are "NO", then may proceed with Cephalosporin use.  :   Her Current Medications Are:  Outpatient Encounter Prescriptions as of 01/10/2017  Medication Sig  . atorvastatin (LIPITOR) 80 MG tablet TAKE 1 TABLET EVERY DAY  AT  6:00PM  . Blood Glucose Monitoring Suppl (ONETOUCH VERIO FLEX SYSTEM) w/Device KIT 1 Device by Does not apply route daily. Use to check blood sugars twice a day Dx E11.9  . busPIRone (BUSPAR) 5 MG tablet Take 1 tablet (5 mg total) by mouth 2 (two) times daily. (Patient taking differently: Take 5 mg by mouth daily. )  . CALCIUM-VITAMIN D PO Take by mouth 3 (three) times a week.  . Cholecalciferol (VITAMIN D PO) Take by mouth once a week.  . donepezil (ARICEPT) 10 MG tablet Take 1 tablet (10 mg total) by mouth at bedtime.  Marland Kitchen FLUoxetine (PROZAC) 10 MG capsule Take 10 mg by mouth daily.  Marland Kitchen glucose blood (ONETOUCH VERIO) test strip Use to check blood sugars twice a day Dx E11.9  . lithium carbonate 150 MG capsule Take 150 mg by mouth daily.  Marland Kitchen lithium carbonate 300 MG capsule Take 300 mg by mouth daily.  Marland Kitchen loratadine (CLARITIN) 10 MG tablet TAKE 1 TABLET EVERY DAY  . losartan (COZAAR) 50 MG tablet Take 1 tablet (50 mg total) by mouth daily.  . metFORMIN (GLUCOPHAGE-XR) 500 MG 24 hr tablet TAKE 1 TABLET EVERY DAY WITH BREAKFAST  . Multiple Vitamin (MULTIVITAMIN WITH MINERALS) TABS tablet Take 1 tablet by mouth at bedtime.  Marland Kitchen omeprazole (PRILOSEC) 40 MG capsule TAKE 1 CAPSULE EVERY DAY  BEFORE  BREAKFAST  . ONETOUCH DELICA LANCETS 64Q MISC Use to help check blood sugars twice a day. Dx E11.9  . PRADAXA 150 MG CAPS capsule TAKE 1 CAPSULE TWICE DAILY  . SYNTHROID 112 MCG tablet TAKE 1 TABLET EVERY DAY  . VESICARE 5 MG tablet TAKE 1 TABLET EVERY DAY  . [DISCONTINUED] lithium carbonate 300 MG capsule    No facility-administered encounter medications on file as of 01/10/2017.   :  Review of  Systems:  Out of a  complete 14 point review of systems, all are reviewed and negative with the exception of these symptoms as listed below: Review of Systems  Neurological:       Pt presents today to discuss her sleep study results and to discuss possibly starting cpap.    Objective:  Neurologic Exam  Physical Exam Physical Examination:   Vitals:   01/10/17 0810  BP: 126/70  Pulse: 72   General Examination: The patient is a very pleasant 75 y.o. female in no acute distress. She appears well-developed and well-nourished and well groomed.   HEENT: Normocephalic, atraumatic, pupils are equal, round and reactive to light and accommodation. Extraocular tracking is good without limitation to gaze excursion or nystagmus noted. Normal smooth pursuit is noted. Hearing is grossly intact. Face is symmetric with normal facial animation and normal facial sensation. Speech is clear with no dysarthria noted. There is no hypophonia. Neck is supple with full range of passive and active motion. There is a mild head tremor, no voice tremor. Oropharynx exam reveals: moderate mouth dryness, adequate dental hygiene and moderate airway crowding, due to redundant soft palate and wider uvula, tonsils are small or absent? Mallampati is class II. Tongue protrudes centrally and palate elevates symmetrically.   Chest: Clear to auscultation without wheezing, rhonchi or crackles noted.  Heart: S1+S2+0, regular and normal without murmurs, rubs or gallops noted.   Abdomen: Soft, non-tender and non-distended with normal bowel sounds appreciated on auscultation.  Extremities: There is no pitting edema in the distal lower extremities bilaterally. Pedal pulses are intact.  Skin: Warm and dry without trophic changes noted. There are bruises.   Musculoskeletal: exam reveals no obvious joint deformities, tenderness or joint swelling or erythema.   Neurologically:  Mental status: The patient is awake, alert and oriented in all 4  spheres. Her immediate and remote memory, attention, language skills and fund of knowledge are appropriate. There is no evidence of aphasia, agnosia, apraxia or anomia. Speech is clear with normal prosody and enunciation. Thought process is linear. Mood is normal and affect is normal.  Cranial nerves II - XII are as described above under HEENT exam. In addition: shoulder shrug is normal with equal shoulder height noted.  Motor exam: Normal bulk, strength and tone is noted. There is no drift, resting tremor, but has a mild postural and action tremor in both UEs, no LE tremor. Romberg is negative. Reflexes are 1+.  Fine motor skills and coordination: intact with normal finger taps, normal hand movements, normal rapid alternating patting, normal foot taps and normal foot agility. No significant decrement in amplitude, no resting tremor noted. Cerebellar testing: No dysmetria or intention tremor on finger to nose testing. Heel to shin is unremarkable bilaterally. There is no truncal or gait ataxia.  Sensory exam: intact to light touch in the upper and lower extremities.  Gait, station and balance: She stands easily. No veering to one side is noted. No leaning to one side is noted. Posture is age-appropriate and stance is narrow based. Gait shows normal stride length and normal pace. No problems turning are noted. Tandem walk is unremarkable.     Assessment and Plan:  In summary, Stacy [name] is a very pleasant 76 year old female with a complex medical history of hyperlipidemia, reflux disease, hypertension, hypothyroidism, complete heart block, s/p pacemaker placement, A. fib, history of liver abscess, bipolar disorder, basal cell skin cancer, COPD with ongoing smoking, heart disease, status post PTCA, and vitamin D  deficiency, who presents for follow up consultation of her OSA, after sleep study testing. Today, we talked about her sleep study results in detail, including her baseline sleep study from  December 2017 as well as her CPAP titration study from January 2018 and I pointed out the differences between her sleep quality, sleep consolidation, sleep architecture and sleep apnea scores. Given her medical history, particularly her heart related history I suggested she start treatment for her sleep apnea with CPAP therapy at home. She is agreeable. I again explained the diagnosis of OSA, its prognosis and treatment options. We talked about medical treatments, surgical interventions and non-pharmacological approaches. I explained in particular the risks and ramifications of untreated moderate to severe OSA, especially with respect to developing cardiovascular disease down the Road, including congestive heart failure, difficult to treat hypertension, cardiac arrhythmias, or stroke. I again explained the CPAP treatment option to the patient, who indicated that she would be willing to try CPAP. I explained the importance of being compliant with PAP treatment, not only for insurance purposes but primarily to improve Her symptoms, and for the patient's long term health benefit, including to reduce Her cardiovascular risks. I suggested a follow-up in about 10 weeks after CPAP set up. I answered all her questions today and she was in agreement with the plan. Of note, the patient is noted to have a mild head and hand tremor. She has no resting tremor, no significant lateralization and no telltale parkinsonism. More likely, she has a drug-induced tremor, specifically from the lithium. I suggested we monitor. I spent 25 minutes in total face-to-face time with the patient, more than 50% of which was spent in counseling and coordination of care, reviewing test results, reviewing medication and discussing or reviewing the diagnosis of OSA, its prognosis and treatment options. Pertinent laboratory and imaging test results that were available during this visit with the patient were reviewed by me and considered in my medical  decision making (see chart for details).

## 2017-01-11 DIAGNOSIS — R69 Illness, unspecified: Secondary | ICD-10-CM | POA: Diagnosis not present

## 2017-01-12 DIAGNOSIS — R69 Illness, unspecified: Secondary | ICD-10-CM | POA: Diagnosis not present

## 2017-01-15 ENCOUNTER — Telehealth (INDEPENDENT_AMBULATORY_CARE_PROVIDER_SITE_OTHER): Payer: Self-pay

## 2017-01-15 ENCOUNTER — Telehealth: Payer: Self-pay | Admitting: Internal Medicine

## 2017-01-15 MED ORDER — DABIGATRAN ETEXILATE MESYLATE 150 MG PO CAPS: 150.0000 mg | ORAL_CAPSULE | Freq: Two times a day (BID) | ORAL | 0 refills | Status: DC

## 2017-01-15 NOTE — Telephone Encounter (Signed)
Called and left message for patient regarding labs and medication changes.

## 2017-01-15 NOTE — Telephone Encounter (Signed)
Pt called back and verified the correct [employer] mail order pharmacy. The one on file is the correct one. Pradaxa 150 mg BID was reordered for a 1 month supply to the [facility] on file.

## 2017-01-15 NOTE — Telephone Encounter (Signed)
Handled in lab result.

## 2017-01-15 NOTE — Telephone Encounter (Signed)
Patient calling, states that [employer] requires that our office fax over a prescription for Pradaxa 150 mg. Fax number [phone]

## 2017-01-15 NOTE — Telephone Encounter (Signed)
Spoke with Pt about having her Pradaxa faxed. I informed Pt to call [employer] and ask them exactly what mail order pharmacy she uses so that way I send her Rx to the right one. Pt will call me back once she calls her insurance company. Pt states she is switching to [facility] on July 1st because she dislikes [employer]. Waiting on Pt to call back with the right mail order pharmacy.

## 2017-01-16 ENCOUNTER — Telehealth (INDEPENDENT_AMBULATORY_CARE_PROVIDER_SITE_OTHER): Payer: Self-pay | Admitting: "Endocrinology

## 2017-01-16 DIAGNOSIS — G4733 Obstructive sleep apnea (adult) (pediatric): Secondary | ICD-10-CM | POA: Diagnosis not present

## 2017-01-16 NOTE — Telephone Encounter (Signed)
°  Who's calling (name and relationship to patient) : Aleina, Burgio Best contact number: [phone] Provider they see: Tobe Sos, MD Reason for call: Patient called and left Voicemail stating that she called Dr. Johnell Comings. As instructed by Dr. Tobe Sos for neurology concern. She was told she needed more information as to why she needed to be seen (referral).     PRESCRIPTION REFILL ONLY  Name of prescription:  Pharmacy:

## 2017-01-16 NOTE — Telephone Encounter (Signed)
Routed to provider

## 2017-01-18 ENCOUNTER — Telehealth (INDEPENDENT_AMBULATORY_CARE_PROVIDER_SITE_OTHER): Payer: Self-pay | Admitting: "Endocrinology

## 2017-01-18 NOTE — Telephone Encounter (Signed)
1. Ms Upperman called our office two days ago when I was out of town. She had set up an appointment with Dr. Johnell Comings, a neurologist whom she had seen before, to discuss my concern that her tremor might be a sign of early Parkinson's disease. 2. When I reviewed her records, I saw that she had been examined by another neurologist on 01/10/17, Dr. Rexene Alberts, who was also consulting on her for OSA. When Ms Risk brought up my concern to Dr. Rexene Alberts, Dr. Rexene Alberts performed a complete exam and did not find any evidence for Parkinson's disease. Dr. Rexene Alberts felt that Ms. Paquette's tremor was drug-induced, most likely due to lithium.  3. When I reminded Ms. Welliver of Dr. Guadelupe Sabin comments this evening, Ms. Stoneham did remember that Dr. Rexene Alberts had told her that the tremor was most likely due to lithium. 4. I told Ms. Ake that since Dr. Rexene Alberts saw no evidence for Parkinson's disease, I did not think that Ms Granados needed to see Dr. Johnell Comings. Ms. Humann will cancel the appointment with Dr. Johnell Comings. Tillman Sers, MD, CDE

## 2017-01-23 ENCOUNTER — Telehealth: Payer: Self-pay

## 2017-01-23 ENCOUNTER — Telehealth: Payer: Self-pay | Admitting: Neurology

## 2017-01-23 MED ORDER — DONEPEZIL HCL 10 MG PO TABS: 10.0000 mg | ORAL_TABLET | Freq: Every day | ORAL | 0 refills | Status: DC

## 2017-01-23 NOTE — Telephone Encounter (Signed)
Caller: Hassan Rowan  Urgent? Yes  Reason for the call: she is out of her Aricept medication and needs it refilled at [facility] off of [address]. Thanks

## 2017-01-23 NOTE — Telephone Encounter (Signed)
Pt is requesting a rf for aricept.  Pt is due for a 6 month follow. Pt contacted and appt scheduled.   erx for 30 sent to [facility] on [address]

## 2017-01-23 NOTE — Telephone Encounter (Signed)
Patient made aware we don't prescribe medications to her. Haven't seen her since 2015.

## 2017-01-27 NOTE — Progress Notes (Signed)
Subjective:    Patient ID: Stacy [name], female    DOB: 06/20/1941, 76 y.o.   MRN: [id number]  HPI The patient is here for follow up.  She is working on quitting - she has cut done, now smoking 5-6 cigs a day.   Hypertension: She is taking her medication daily. She is compliant with a low sodium diet.  She denies chest pain, palpitations, edema, shortness of breath and regular headaches. She is exercising irregularly.  She does not monitor her blood pressure at home.    Diabetes: She is following with endocrine.  She is taking her medication daily as prescribed. She is compliant with a diabetic diet. She is not exercising regularly.  She checks her feet daily and denies foot lesions. She is up-to-date with an ophthalmology examination.   GERD:  She is taking her medication daily as prescribed.  She denies any GERD symptoms and feels her GERD is well controlled.   Overactive bladder:  She is taking the vesicare daily.  She still urinates a lot, but she also drinks a lot.    Hyperlipidemia: She is taking her medication daily. She is compliant with a low fat/cholesterol diet. She is exercising regularly. She denies myalgias.    OSA:  She is using cpap.  This is new since she was here last.  She has not noticed a difference with using the cpap and does not like using it.    Medications and allergies reviewed with patient and updated if appropriate.  Patient Active Problem List   Diagnosis Date Noted  . GERD (gastroesophageal reflux disease) 07/12/2016  . Bilateral shoulder pain 04/26/2016  . Polyarthralgia 04/03/2016  . Greater trochanteric bursitis of left hip 06/17/2015  . Allergic rhinitis 08/04/2013  . Hypothyroidism, acquired, autoimmune 05/10/2013  . OAB (overactive bladder)   . Parathyroid related hypercalcemia ([place])   . Primary hyperparathyroidism ([place])   . Vitamin D deficiency disease   . Pacemaker-Medtronic-dual-chamber 11/17/2010  . Diabetes ([place]) 09/08/2010  .  TREMOR 01/17/2010  . ATRIAL FIBRILLATION 11/11/2009  . DEPRESSION 08/11/2009  . ABSCESS OF LIVER 08/11/2009  . TOBACCO ABUSE 03/22/2009  . CAD S/P percutaneous coronary angioplasty 03/26/2008  . AV block-complete-intermittent 03/26/2008  . Hyperlipidemia 02/25/2008  . Bipolar disorder ([place]) 02/25/2008  . Essential hypertension 02/25/2008  . DIVERTICULOSIS, COLON 02/25/2008    Current Outpatient Prescriptions on File Prior to Visit  Medication Sig Dispense Refill  . atorvastatin (LIPITOR) 80 MG tablet TAKE 1 TABLET EVERY DAY  AT  6:00PM 90 tablet 3  . Blood Glucose Monitoring Suppl (ONETOUCH VERIO FLEX SYSTEM) w/Device KIT 1 Device by Does not apply route daily. Use to check blood sugars twice a day Dx E11.9 1 kit 0  . CALCIUM-VITAMIN D PO Take by mouth 3 (three) times a week.    . Cholecalciferol (VITAMIN D PO) Take by mouth once a week.    . dabigatran (PRADAXA) 150 MG CAPS capsule Take 1 capsule (150 mg total) by mouth 2 (two) times daily. 60 capsule 0  . donepezil (ARICEPT) 10 MG tablet Take 1 tablet (10 mg total) by mouth at bedtime. 30 tablet 0  . glucose blood (ONETOUCH VERIO) test strip Use to check blood sugars twice a day Dx E11.9 200 each 3  . lithium carbonate 150 MG capsule Take 150 mg by mouth daily.    Marland Kitchen lithium carbonate 300 MG capsule Take 300 mg by mouth daily.    Marland Kitchen loratadine (CLARITIN) 10 MG tablet TAKE  1 TABLET EVERY DAY 90 tablet 2  . losartan (COZAAR) 50 MG tablet Take 1 tablet (50 mg total) by mouth daily. 90 tablet 0  . metFORMIN (GLUCOPHAGE-XR) 500 MG 24 hr tablet TAKE 1 TABLET EVERY DAY WITH BREAKFAST 30 tablet 0  . Multiple Vitamin (MULTIVITAMIN WITH MINERALS) TABS tablet Take 1 tablet by mouth at bedtime.    Marland Kitchen omeprazole (PRILOSEC) 40 MG capsule TAKE 1 CAPSULE EVERY DAY  BEFORE  BREAKFAST 90 capsule 2  . ONETOUCH DELICA LANCETS 97F MISC Use to help check blood sugars twice a day. Dx E11.9 200 each 3  . SYNTHROID 112 MCG tablet TAKE 1 TABLET EVERY DAY (Patient  taking differently: TAKE 1 TABLET EVERY DAY, 2 times a week take 1.5 tablets) 90 tablet 3  . VESICARE 5 MG tablet TAKE 1 TABLET EVERY DAY 90 tablet 2  . FLUoxetine (PROZAC) 10 MG capsule Take 10 mg by mouth daily.     No current facility-administered medications on file prior to visit.     Past Medical History:  Diagnosis Date  . Abscess of liver(572.0)   . Atrial fibrillation (HCC)    chronic anticoag - pradaxa  . BIPOLAR AFFECTIVE DISORDER   . Cancer ([place])    basal cell on abdomen  . Chronic bipolar disorder ([place])   . Complete AV block ([place])    s/p PPM--MEDTRONIC ADAPTA ADDr01  . COPD (chronic obstructive pulmonary disease) (HCC)    TOBACCO ABUSE  . Coronary artery disease    s/p PTCA  . DEPRESSION   . DIABETES MELLITUS, TYPE II dx 04/2010  . DYSLIPIDEMIA   . GERD (gastroesophageal reflux disease)   . HYPERTENSION   . HYPOTHYROIDISM    hashimoto's  . Pacemaker-Medtronic-dual-chamber   . Parathyroid related hypercalcemia ([place])   . Primary hyperparathyroidism ([place])   . Takotsubo syndrome   . TOBACCO ABUSE   . Vitamin D deficiency disease     Past Surgical History:  Procedure Laterality Date  . ABDOMINAL HYSTERECTOMY    . CARDIAC CATHETERIZATION    . EP IMPLANTABLE DEVICE N/A 08/05/2015   Procedure:  PPM Generator Changeout;  Surgeon: Deboraha Sprang, MD;  Location: [facility];  Service: Cardiovascular;  Laterality: N/A;  . INSERT / REPLACE / REMOVE PACEMAKER     MEDTRONIC ADAPTA ADDr01  . MASS EXCISION Left 01/27/2013   Procedure: EXCISION MASS LEFT FLANK;  Surgeon: Earnstine Regal, MD;  Location: [facility];  Service: General;  Laterality: Left;  . PARATHYROIDECTOMY     RIGHT INFERIOR    Social History   Social History  . Marital status: Widowed    Spouse name: N/A  . Number of children: 2  . Years of education: 12   Occupational History  .  Retired   Social History Main Topics  . Smoking status: Current Every Day Smoker    Packs/day: 1.00    Years:  48.00    Types: Cigarettes  . Smokeless tobacco: Never Used     Comment: switched to vapor cigs. Widowed- 2 girls. Lives with daughter (who is nurse at [employer])  . Alcohol use No     Comment: "once in a blue moon" when I have [place] food  . Drug use: No  . Sexual activity: No   Other Topics Concern  . Not on file   Social History Narrative   WIDOWED   2 DAUGHTERS   LIVES W/DAUGHTER   CURRENTLY SMOKES   NO ALCOHOL USE  NO ILLICIT DRUG USE   DAILY CAFFEINE USE, 2 per day       PPM-MEDTRONIC   PATIENT SIGNED A DESIGNATED PARTY RELEASE TO ALLOW DAUGHTER, NICOLE COX, TO HAVE ACCESS TO HER MEDICAL RECORDS/INFORMATION. Fleet Contras, November 09, 2009 9:19 AM    Family History  Problem Relation Age of Onset  . CAD Mother   . CAD Father   . Colon cancer Unknown        FH  . Diabetes Maternal Aunt   . Diabetes type II Sister     Review of Systems  Constitutional: Negative for chills and fever.  Respiratory: Negative for cough, shortness of breath and wheezing.   Cardiovascular: Negative for chest pain, palpitations and leg swelling.  Gastrointestinal: Negative for abdominal pain.  Neurological: Negative for light-headedness and headaches.       Objective:   Vitals:   01/29/17 1131  BP: 120/72  Pulse: 84  Resp: 16  Temp: 97.6 F (36.4 C)   Wt Readings from Last 3 Encounters:  01/29/17 115 lb (52.2 kg)  01/10/17 114 lb (51.7 kg)  01/08/17 114 lb 9.6 oz (52 kg)   Body mass index is 18.56 kg/m.   Physical Exam    Constitutional: Appears well-developed and well-nourished. No distress.  HENT:  Head: Normocephalic and atraumatic.  Neck: Neck supple. No tracheal deviation present. No thyromegaly present.  No cervical lymphadenopathy Cardiovascular: Normal rate, regular rhythm and normal heart sounds.   No murmur heard. No carotid bruit .  No edema Pulmonary/Chest: Effort normal and breath sounds normal. No respiratory distress. No has no wheezes. No rales.  Skin:  Skin is warm and dry. Not diaphoretic.  Psychiatric: Normal mood and affect. Behavior is normal.      Assessment & Plan:    See Problem List for Assessment and Plan of chronic medical problems.

## 2017-01-27 NOTE — Patient Instructions (Addendum)

## 2017-01-29 ENCOUNTER — Encounter: Payer: Self-pay | Admitting: Internal Medicine

## 2017-01-29 ENCOUNTER — Ambulatory Visit (INDEPENDENT_AMBULATORY_CARE_PROVIDER_SITE_OTHER): Payer: Medicare HMO | Admitting: Internal Medicine

## 2017-01-29 VITALS — BP 120/72 | HR 84 | Temp 97.6°F | Resp 16 | Wt 115.0 lb

## 2017-01-29 DIAGNOSIS — I10 Essential (primary) hypertension: Secondary | ICD-10-CM

## 2017-01-29 DIAGNOSIS — K219 Gastro-esophageal reflux disease without esophagitis: Secondary | ICD-10-CM

## 2017-01-29 DIAGNOSIS — N3281 Overactive bladder: Secondary | ICD-10-CM | POA: Diagnosis not present

## 2017-01-29 DIAGNOSIS — E119 Type 2 diabetes mellitus without complications: Secondary | ICD-10-CM | POA: Diagnosis not present

## 2017-01-29 DIAGNOSIS — E063 Autoimmune thyroiditis: Secondary | ICD-10-CM | POA: Diagnosis not present

## 2017-01-29 DIAGNOSIS — E7800 Pure hypercholesterolemia, unspecified: Secondary | ICD-10-CM | POA: Diagnosis not present

## 2017-01-29 MED ORDER — SOLIFENACIN SUCCINATE 5 MG PO TABS: 5.0000 mg | ORAL_TABLET | Freq: Every day | ORAL | 3 refills | Status: DC

## 2017-01-29 NOTE — Assessment & Plan Note (Signed)
Fairly controlled Continue vesicare

## 2017-01-29 NOTE — Assessment & Plan Note (Signed)
Lab Results  Component Value Date   HGBA1C 5.8 01/08/2017    Well controlled She sometimes does not eat regularly - monitor for hypoglycemia

## 2017-01-29 NOTE — Assessment & Plan Note (Signed)
GERD well controlled Can try tapering off GERD medication slowly  - can try QOD dosing or 20 mg daily Increase dose back up if symptoms recur

## 2017-01-29 NOTE — Assessment & Plan Note (Signed)
Continue statin - lipitor 80 mg daily

## 2017-01-29 NOTE — Assessment & Plan Note (Signed)
BP well controlled Current regimen effective and well tolerated Continue current medications at current doses  

## 2017-01-29 NOTE — Assessment & Plan Note (Signed)
Management per endocrine 

## 2017-01-31 ENCOUNTER — Telehealth: Payer: Self-pay

## 2017-01-31 ENCOUNTER — Encounter: Payer: Self-pay | Admitting: Neurology

## 2017-01-31 DIAGNOSIS — J449 Chronic obstructive pulmonary disease, unspecified: Secondary | ICD-10-CM

## 2017-01-31 DIAGNOSIS — Z789 Other specified health status: Secondary | ICD-10-CM

## 2017-01-31 DIAGNOSIS — G4761 Periodic limb movement disorder: Secondary | ICD-10-CM

## 2017-01-31 DIAGNOSIS — G4733 Obstructive sleep apnea (adult) (pediatric): Secondary | ICD-10-CM

## 2017-01-31 NOTE — Telephone Encounter (Signed)
I called pt. I explained to her that Dr. Brett Fairy changed her pressure on her PAP machine to an auto setting 5-13 cm H2O for 14 days. We will call her with those results and then discuss any further pressure changes at that point. I advised her that this order will be sent to [facility]. Pt verbalized understanding and appreciation.

## 2017-01-31 NOTE — Telephone Encounter (Signed)
I have used an auto titration order instead of just reducing her pressure, looking at her recent download I think she needs a softer way of treating OSA.

## 2017-01-31 NOTE — Telephone Encounter (Signed)
Received this message via mychart for this Dr. Rexene Alberts pt. Will send to Dr. Brett Fairy for review in Dr. Guadelupe Sabin absence.   Dr. Rexene Alberts,  I started using the C-PAP two weeks ago. I have met with Nevin Bloodgood last week for a new mask. We discussed the fact that the mask isn't fitting well and I feel too much pressure is being given by the machine when trying to sleep. Nevin Bloodgood wanted me to try to new mask first before contacting you. I would like to have the pressure reduced to see if I can tolerate the mask better. If not, I feel I will not be able to continue using the C-PAP machine. I am NOT sleeping well and it is affecting my "well-being". Please contact me back.     Thank you,  Stacy [name]  [phone]

## 2017-01-31 NOTE — Telephone Encounter (Signed)
I called here the results of Dr. Evalina Field sleep study with this patient : Baseline sleep study from 07/12/2016 showed a sleep efficiency at 68.8%, sleep latency was prolonged at 44.5 minutes, REM latency prolonged at 139.5 minutes. She had mild to moderate sleep fragmentation. She had an increased percentage of stage II sleep, absence of slow-wave sleep and REM sleep was reduced at 7.4%. She had rare PVCs and pacer artifact on EKG. Total AHI was 11.9 per hour, REM AHI was 53.7 per hour, supine AHI was 30.8 per hour, average oxygen saturation was 95%, nadir was 88%. She had severe PLMS with an index of 62.8 per hour, with minimal arousals. Based on her sleep-related complaints and her test results I invited her for a CPAP titration study for treatment of her OSA. She had this on 08/29/2016, sleep efficiency was better at 86.4%, sleep latency was 34 minutes, REM latency prolonged at 175.5 minutes. She had an increased percentage of stage II sleep, absence of slow-wave sleep and REM sleep was normal at 23.6%. She had good results of CPAP of 13 cm with an AHI of 0 per hour and nonsupine REM sleep achieved, O2 nadir of 94%.   Since Stacy [name] feels that she cannot tolerate the current pressure setting of 13 cm water I will offer her a reduction to 11 cm and see if she can tolerate this better. She has been refitted for an interface that she tolerates better already. CD

## 2017-02-06 ENCOUNTER — Telehealth: Payer: Self-pay | Admitting: *Deleted

## 2017-02-06 MED ORDER — DONEPEZIL HCL 10 MG PO TABS: 10.0000 mg | ORAL_TABLET | Freq: Every day | ORAL | 1 refills | Status: DC

## 2017-02-06 NOTE — Telephone Encounter (Signed)
Pt left msg on triage requesting refill on ger Aricept to be sent to [facility]. Verified chart pt is up-to date sent rx electronically to [facility][name]

## 2017-02-08 ENCOUNTER — Ambulatory Visit (INDEPENDENT_AMBULATORY_CARE_PROVIDER_SITE_OTHER): Payer: Medicare HMO | Admitting: *Deleted

## 2017-02-08 DIAGNOSIS — I442 Atrioventricular block, complete: Secondary | ICD-10-CM | POA: Diagnosis not present

## 2017-02-08 DIAGNOSIS — F3181 Bipolar II disorder: Secondary | ICD-10-CM | POA: Diagnosis not present

## 2017-02-08 NOTE — Progress Notes (Signed)
Remote pacemaker transmission.   

## 2017-02-09 LAB — CUP PACEART REMOTE DEVICE CHECK
Battery Voltage: 2.8 V
Brady Statistic AP VS Percent: 61 %
Brady Statistic AS VP Percent: 0 %
Brady Statistic AS VS Percent: 24 %
Date Time Interrogation Session: 20180705120520
Implantable Lead Implant Date: 20010521
Implantable Lead Implant Date: 20010521
Implantable Lead Location: 753860
Lead Channel Impedance Value: 1133 Ohm
Lead Channel Impedance Value: 589 Ohm
Lead Channel Pacing Threshold Amplitude: 1.375 V
Lead Channel Pacing Threshold Pulse Width: 0.4 ms
Lead Channel Pacing Threshold Pulse Width: 0.4 ms
Lead Channel Sensing Intrinsic Amplitude: 11.2 mV
Lead Channel Setting Pacing Amplitude: 2 V
Lead Channel Setting Sensing Sensitivity: 4 mV
MDC IDC LEAD LOCATION: 753859
MDC IDC MSMT BATTERY IMPEDANCE: 134 Ohm
MDC IDC MSMT BATTERY REMAINING LONGEVITY: 148 mo
MDC IDC MSMT LEADCHNL RA PACING THRESHOLD AMPLITUDE: 0.625 V
MDC IDC MSMT LEADCHNL RA SENSING INTR AMPL: 2.8 mV
MDC IDC PG IMPLANT DT: 20161229
MDC IDC SET LEADCHNL RV PACING AMPLITUDE: 2.75 V
MDC IDC SET LEADCHNL RV PACING PULSEWIDTH: 0.4 ms
MDC IDC STAT BRADY AP VP PERCENT: 14 %

## 2017-02-12 NOTE — Telephone Encounter (Signed)
Pt called asking if rx for aricept has been sent.  Informed pt that it has been. Pt is calling [facility].

## 2017-02-15 DIAGNOSIS — G4733 Obstructive sleep apnea (adult) (pediatric): Secondary | ICD-10-CM | POA: Diagnosis not present

## 2017-02-16 ENCOUNTER — Encounter: Payer: Self-pay | Admitting: Cardiology

## 2017-02-19 ENCOUNTER — Telehealth: Payer: Self-pay | Admitting: Internal Medicine

## 2017-02-19 NOTE — Telephone Encounter (Signed)
New message     Pt c/o medication issue:  1. Name of Medication:  Pradaxa   2. How are you currently taking this medication (dosage and times per day)? 1 in morning 1 at night   3. Are you having a reaction (difficulty breathing--STAT)? No    4. What is your medication issue?  Having a lot of bruising

## 2017-02-19 NOTE — Telephone Encounter (Signed)
Called and spoke with patient. Advised patient that it is normal to have bruising while on this medication. Advised patient that if she has any active bleeding in her urine or stool to please call and advise [place] on this. Patient verbalized understanding

## 2017-03-09 ENCOUNTER — Telehealth: Payer: Self-pay | Admitting: Internal Medicine

## 2017-03-09 MED ORDER — DABIGATRAN ETEXILATE MESYLATE 150 MG PO CAPS: 150.0000 mg | ORAL_CAPSULE | Freq: Two times a day (BID) | ORAL | 1 refills | Status: DC

## 2017-03-09 NOTE — Telephone Encounter (Signed)
Pt last saw Dr Caryl Comes 11/08/16, last labs 11/08/16 Creat 0.95, age 76, weight 52.2 kg, CrCl 41.52, Based on CrCl pt is on appropriate dosage of Pradaxa 150mg .  Will refill rx.

## 2017-03-09 NOTE — Telephone Encounter (Signed)
New message     *STAT* If patient is at the pharmacy, call can be transferred to refill team.   1. Which medications need to be refilled? (please list name of each medication and dose if known) Pradaxa 150 mg  2. Which pharmacy/location (including street and city if local pharmacy) is medication to be sent to? [place]   3. Do they need a 30 day or 90 day supply? 90 day

## 2017-03-15 ENCOUNTER — Telehealth: Payer: Self-pay | Admitting: Internal Medicine

## 2017-03-15 MED ORDER — LOSARTAN POTASSIUM 50 MG PO TABS: 50.0000 mg | ORAL_TABLET | Freq: Every day | ORAL | 1 refills | Status: DC

## 2017-03-15 NOTE — Telephone Encounter (Signed)
Patient requesting refill on losartan to be sent to [facility].

## 2017-03-15 NOTE — Telephone Encounter (Signed)
Reviewed chart pt is up-to-date sent refill to [facility]...[name]

## 2017-03-22 NOTE — Progress Notes (Signed)
Received this notice from [facility]:  "I am sorry that you have not been alerted yet. Patient returned her cpap on 02/15/17 and signed AMA stating that she didn't want it. Therefore she was removed from [place]. "

## 2017-03-28 ENCOUNTER — Ambulatory Visit: Payer: Medicare HMO | Admitting: Neurology

## 2017-04-05 DIAGNOSIS — F3181 Bipolar II disorder: Secondary | ICD-10-CM | POA: Diagnosis not present

## 2017-04-12 ENCOUNTER — Ambulatory Visit: Payer: Medicare HMO | Admitting: Neurology

## 2017-04-16 ENCOUNTER — Ambulatory Visit (INDEPENDENT_AMBULATORY_CARE_PROVIDER_SITE_OTHER): Payer: Medicare HMO | Admitting: "Endocrinology

## 2017-04-17 ENCOUNTER — Other Ambulatory Visit: Payer: Self-pay | Admitting: Internal Medicine

## 2017-04-17 NOTE — Telephone Encounter (Signed)
Is Endo taking care of her Diabetes?

## 2017-05-07 ENCOUNTER — Encounter (INDEPENDENT_AMBULATORY_CARE_PROVIDER_SITE_OTHER): Payer: Self-pay | Admitting: "Endocrinology

## 2017-05-07 ENCOUNTER — Other Ambulatory Visit (INDEPENDENT_AMBULATORY_CARE_PROVIDER_SITE_OTHER): Payer: Self-pay | Admitting: "Endocrinology

## 2017-05-07 ENCOUNTER — Ambulatory Visit (INDEPENDENT_AMBULATORY_CARE_PROVIDER_SITE_OTHER): Payer: Medicare HMO | Admitting: "Endocrinology

## 2017-05-07 VITALS — BP 100/76 | HR 90 | Wt 112.0 lb

## 2017-05-07 DIAGNOSIS — F3173 Bipolar disorder, in partial remission, most recent episode manic: Secondary | ICD-10-CM

## 2017-05-07 DIAGNOSIS — E049 Nontoxic goiter, unspecified: Secondary | ICD-10-CM | POA: Diagnosis not present

## 2017-05-07 DIAGNOSIS — R5383 Other fatigue: Secondary | ICD-10-CM | POA: Diagnosis not present

## 2017-05-07 DIAGNOSIS — R251 Tremor, unspecified: Secondary | ICD-10-CM | POA: Diagnosis not present

## 2017-05-07 DIAGNOSIS — E063 Autoimmune thyroiditis: Secondary | ICD-10-CM | POA: Diagnosis not present

## 2017-05-07 DIAGNOSIS — E211 Secondary hyperparathyroidism, not elsewhere classified: Secondary | ICD-10-CM | POA: Diagnosis not present

## 2017-05-07 DIAGNOSIS — R7303 Prediabetes: Secondary | ICD-10-CM

## 2017-05-07 DIAGNOSIS — E559 Vitamin D deficiency, unspecified: Secondary | ICD-10-CM | POA: Diagnosis not present

## 2017-05-07 LAB — POCT GLUCOSE (DEVICE FOR HOME USE): POC Glucose: 149 mg/dl — AB (ref 70–99)

## 2017-05-07 LAB — POCT GLYCOSYLATED HEMOGLOBIN (HGB A1C): HEMOGLOBIN A1C: 5.7

## 2017-05-07 NOTE — Patient Instructions (Signed)
Follow up visit in 3 months. 

## 2017-05-07 NOTE — Progress Notes (Signed)
Subjective:  Patient Name: Stacy [name] Date of Birth: March 11, 1941  MRN: [id number]  Stacy [name]  presents to the office today for follow-up of her IT'D, hypothyroidism, thyroiditis, hypertension, fatigue, secondary hyperparathyroidism, vitamin D deficiency, hyperlipidemia, tobacco abuse, and tremor.  HISTORY OF PRESENT ILLNESS:   Stacy [name] is a 76 y.o. Caucasian woman.  Stacy [name] was accompanied by her daughter, Stacy [name], today.  1. Stacy [name] was first referred to me on 09/04/05 for evaluation and management of chronic hypothyroidism and new hypercalcemia secondary to primary hyperparathyroidism.   A. She had developed hypothyroidism due to thyroiditis some 15 years before and was being treated with Synthroid. Since that first visit with me she has had some waxing and waning of her thyroid gland size which is consistent with inflammatory flare-ups of her Hashimoto's disease. We've increased her thyroid hormone dose slightly over time. She has remained euthyroid most of the time.   B. She had also developed vitamin D deficiency, then secondary hyperparathyroidism, followed by tertiary hyperparathyroidism during the ten year period prior to seeing me for the first tim. Her right inferior parathyroid gland was quite enlarged. She was taken to surgery by Dr. Armandina Gemma on 12/07/05 for parathyroidectomy. Since that time she's done well, provided that she has continued to take her calcium and vitamin D.    2. The patient has experienced many other problems during the last 10 years, to include bipolar disorder, GERD, fatigue, iron deficiency, 2 heart attacks, hypertension, orthostatic hypotension, atrial fibrillation, and the need for a cardiac pacemaker. We've helped to co-manage some of those problems. She was also diagnosed with type 2 diabetes in early 2012 and was started on metformin 500 mg once daily. She was also started on atorvastatin for hyperlipidemia.  3. The patient's last [facility] visit was on  01/08/17. After reviewing her lab results and seeing that she was hypothyroid, I increased her Synthroid 112 mcg tablets, to 1.5 tablets on two days per week and one pill/day for 5 days each week.   A. In the interim, she had been generally healthy physically, but has been having more anxiety. Her appetite is not as good and she is losing weight. She has not been more depressed, but lately seems to feel better outside the house than inside. Both she and her daughter have seasonal affective disorder, so they get more depressed when the days get shorter.  B. She has not been as shaky. She is now taking 500 mg of vitamin B6, twice daily to treat the shakiness.   C. She was diagnosed with bursitis rather than arthritis. She saw an orthopedist in [place] who told her that she had a frozen shoulder and bursitis in her hip. She had 4 cortisone injections between early April and early June, but has not had any cortisone injections since. When her BGs increased after the injections she did not call us so that we could increase her metformin to compensate. The orthopedist ordered PT, but she can't afford to go to PT.  D. She is still smoking, reportedly only 3-4 cigarettes per day.  E. She is taking Synthroid, 112 mcg/day 5 days each week and 1.5 of the pills on two days per week; metformin XL, 500 mg, once daily.  She also takes atorvastatin 80 mg/every other day, calcium carbonate with vitamin D each evening, fluoxetine now 20 mg/day, donepezil, Pradaxa, omeprazole tiw, lithium, losartan, and solifenacin.   F. She has not had any problems with vertigo or orthostasis recently.   G. Her  allergies have bothered her recently.     H. She has a recent neuro evaluation for OSA. She had sleep studies in January and February and was told that she needs C-pap. Unfortunately, she did not tolerate her C-pap machine and turned it back in.   I. She has not been walking much.    4. Pertinent Review of Systems:   Constitutional: The patient fells "pretty good" today. She continues to have some problems with early awakening at times.  She also has nocturia. Her energy level varies. Her stamina is not so good. She has been more anxious as noted.  Eyes: Vision is good as long as she wears her glasses. She had her last eye exam in early November 2017. No eye disease was noted. There are no other significant eye complaints. She has a follow up visit soon.  Neck: She has no complaints of anterior neck swelling, soreness, tenderness,  pressure, discomfort, or difficulty swallowing.  Heart: She does not feel her A-fib. She has no complaints of palpitations, irregular heat beats, chest pain, or chest pressure.  Gastrointestinal: Food does not taste good. Bowel movements have been normal. She has no complaints of acid reflux, upset stomach, stomach aches or pains. Arms: Her shoulders are less painful since her steroid injections.    Legs: She still has pains in her hips and knees, but are not bad enough to prevent her from walking.  Muscle mass and strength seem normal. There are no complaints of numbness, tingling, or burning. No edema is noted. Feet: There are no obvious foot problems. There are no complaints of numbness, tingling, burning, or pain. No edema is noted. Hypoglycemia: None  5. BG printout: She checked BGs 7 times in the past 3 weeks. Average BG was 128, range 105-181. Morning BGs were 105 and 113 fasting. One afternoon BG was 181.    PAST MEDICAL, FAMILY, AND SOCIAL HISTORY:  Past Medical History:  Diagnosis Date  . Abscess of liver(572.0)   . Atrial fibrillation (HCC)    chronic anticoag - pradaxa  . BIPOLAR AFFECTIVE DISORDER   . Cancer ([place])    basal cell on abdomen  . Chronic bipolar disorder ([place])   . Complete AV block ([place])    s/p PPM--MEDTRONIC ADAPTA ADDr01  . COPD (chronic obstructive pulmonary disease) (HCC)    TOBACCO ABUSE  . Coronary artery disease    s/p PTCA  . DEPRESSION    . DIABETES MELLITUS, TYPE II dx 04/2010  . DYSLIPIDEMIA   . GERD (gastroesophageal reflux disease)   . HYPERTENSION   . HYPOTHYROIDISM    hashimoto's  . Pacemaker-Medtronic-dual-chamber   . Parathyroid related hypercalcemia ([place])   . Primary hyperparathyroidism ([place])   . Takotsubo syndrome   . TOBACCO ABUSE   . Vitamin D deficiency disease     Family History  Problem Relation Age of Onset  . CAD Mother   . CAD Father   . Colon cancer Unknown        FH  . Diabetes Maternal Aunt   . Diabetes type II Sister      Current Outpatient Prescriptions:  .  atorvastatin (LIPITOR) 80 MG tablet, TAKE 1 TABLET EVERY DAY  AT  6:00PM (Patient taking differently: take 1 tablet Mon, Wed, Fri), Disp: 90 tablet, Rfl: 3 .  Blood Glucose Monitoring Suppl ([place]) w/Device KIT, 1 Device by Does not apply route daily. Use to check blood sugars twice a day Dx E11.9, Disp: 1 kit,  Rfl: 0 .  busPIRone (BUSPAR) 15 MG tablet, Take 15 mg by mouth daily. , Disp: , Rfl:  .  dabigatran (PRADAXA) 150 MG CAPS capsule, Take 1 capsule (150 mg total) by mouth 2 (two) times daily., Disp: 180 capsule, Rfl: 1 .  donepezil (ARICEPT) 10 MG tablet, Take 1 tablet (10 mg total) by mouth at bedtime., Disp: 90 tablet, Rfl: 1 .  FLUoxetine (PROZAC) 10 MG capsule, Take 20 mg by mouth daily. , Disp: , Rfl:  .  glucose blood (ONETOUCH VERIO) test strip, Use to check blood sugars twice a day Dx E11.9, Disp: 200 each, Rfl: 3 .  lithium carbonate (ESKALITH) 450 MG CR tablet, Take by mouth 2 (two) times daily., Disp: , Rfl:  .  loratadine (CLARITIN) 10 MG tablet, TAKE 1 TABLET EVERY DAY, Disp: 90 tablet, Rfl: 2 .  losartan (COZAAR) 50 MG tablet, Take 1 tablet (50 mg total) by mouth daily., Disp: 90 tablet, Rfl: 1 .  metFORMIN (GLUCOPHAGE-XR) 500 MG 24 hr tablet, TAKE 1 TABLET EVERY DAY WITH BREAKFAST, Disp: 90 tablet, Rfl: 3 .  Multiple Vitamin (MULTIVITAMIN WITH MINERALS) TABS tablet, Take 1 tablet by mouth at  bedtime., Disp: , Rfl:  .  omeprazole (PRILOSEC) 40 MG capsule, TAKE 1 CAPSULE EVERY DAY  BEFORE  BREAKFAST, Disp: 90 capsule, Rfl: 2 .  ONETOUCH DELICA LANCETS 74Y MISC, Use to help check blood sugars twice a day. Dx E11.9, Disp: 200 each, Rfl: 3 .  SYNTHROID 112 MCG tablet, TAKE 1 TABLET EVERY DAY (Patient taking differently: TAKE 1 TABLET EVERY DAY, 2 times a week take 1.5 tablets), Disp: 90 tablet, Rfl: 3 .  VESICARE 5 MG tablet, TAKE 1 TABLET EVERY DAY, Disp: 90 tablet, Rfl: 2 .  CALCIUM-VITAMIN D PO, Take by mouth every morning. , Disp: , Rfl:   Allergies as of 05/07/2017 - Review Complete 05/07/2017  Allergen Reaction Noted  . Codeine Nausea And Vomiting   . Latex Other (See Comments) 05/03/2012  . Metronidazole Other (See Comments) 03/26/2008  . Other Rash 01/24/2013  . Penicillins Swelling and Rash     1. Work and Family: Her daughter, Stacy [name], lives with her. 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    2. Activities: Stacy [name] has been walking more.      3. Smoking, alcohol, or drugs: She still smokes cigarettes.    4. Primary Care Provider: Binnie Rail, MD  5. Psych: Comer Locket, PA, [facility], office [phone], fax [phone]  REVIEW OF SYSTEMS: There are no other significant problems involving Stacy [name]'s other body systems.   Objective:  Vital Signs:  BP 100/76   Pulse 90   Wt 112 lb (50.8 kg)   BMI 18.08 kg/m    Ht Readings from Last 3 Encounters:  01/10/17 '5\' 6"'  (1.676 m)  11/08/16 '5\' 6"'  (1.676 m)  07/05/16 '5\' 4"'  (1.626 m)   Wt Readings from Last 3 Encounters:  05/07/17 112 lb (50.8 kg)  01/29/17 115 lb (52.2 kg)  01/10/17 114 lb (51.7 kg)   PHYSICAL EXAM:  Constitutional: The patient appears healthy and more slender. She looks fairly good.  Her weight has decreased by 3 pounds since her last visit with me. Her mood and affect are fairly normal. Her insight is good.  Head: Her head tremor is not as obvious and is at 1-2+ level.    Face: The face appears normal.  Eyes: There is no obvious arcus or proptosis. Moisture appears normal. Mouth:  The oropharynx is normal. She has trace tongue tremor. Oral moisture is normal. Neck: The neck appears to be visibly normal. No carotid bruits are noted. She has a low-lying thyroid gland. The thyroid gland is again within normal size at 18-20 grams. Both lobes today are within normal limits for size. The consistency of the thyroid gland is normal. The thyroid gland is not tender to palpation. Lungs: The lungs are clear to auscultation. Air movement is good. Heart: Heart rate and rhythm are regular. Heart sounds S1 and S2 are normal. I did not hear any pathologic cardiac murmurs. Abdomen: The abdomen is normal for age. Bowel sounds are normal. There is no obvious hepatomegaly, splenomegaly, or other mass effect.  Arms: Muscle size and bulk are normal for age. She has some cogwheeling rigidity today.  Hands: She has a 2+ coarse hand tremor. Phalangeal and metacarpophalangeal joints are normal. Palmar muscles are normal. She has 1+ palmar erythema. Palmar moisture is normal. Fingernails are somewhat pale. Legs: Muscle size and bulk are below normal for age. No edema is present. Neurologic: Strength is normal to low-normal for age in both the upper and lower extremities. Muscle tone is normal. Sensation to touch is normal in both legs. Sensation to touch is normal in her legs and in her feet. She walked normally, without any of the hesitation and rigidity usually seen with Parkinson's disease.   LAB DATA:   Labs 05/07/17: HbA1c 5.7%, CBG 149  Labs 01/12/17: TSH 4.71, lithium 0.8 (ref 0.6-1.2), glucose 111  Labs 01/08/17: CMP normal; TSH 4.14, free T4 1.3, free T3 2.4   Labs 01/08/17: HbA1c 6.1%, CBG 155  Labs 11/08/16: BMP normal; CBC normal  Labs 07/10/16: HbA1c 6.1%; TSH 2.80, free T4 1.0, free T3 2.5; PTH 28, calcium 10.1, 25-OH vitamin D 45  Labs 12/15/15: HbA1c 6.3%.  Labs  10/25/15: HbA1c 6.5%; CBC normal; CMP normal except for glucose 133; cholesterol 106, triglycerides 114, HDL 54.30, LDL 29  Labs 06/08/15: HbA1c 6/1%; lithium 0.50 (normal 0.80-1.40); vitamin B6 20.5 (normal 2.1-21.7), vitamin B12 1500 (normal 211-911) [Her vitamin B12 level is above the upper limit of normal as defined by the [employer]. She is obviously taking her MVI and her metformin is not causing any Vitamin B12 deficiency. A review of Vitamin B12 by the NIH Office of Dietary Supplements noted a review by the Institute of Medicine that stated "no adverse effects have been associated with excess vitamin B12 intake from food and supplements in healthy individuals". At this point in time I do not see a need to change her MVI dose.]   Labs 06/01/15: TSH 1.632, free T4 1.10, free T3 2.5; PTH 37, calcium 9.8, 25-OH vitamin D 45; cholesterol 99, triglycerides 116, HDL 63, LDL 13  Labs 12/01/14: Hemoglobin A1c was 5.7%.compared with 5.6% at last visit, and with 6.1% at the visit prior.    Labs 10/07/14: Calcium 9.9, PTH 49, 25-OH vitamin D 53; cholesterol 109, triglycerides 109, HDL 69, LDL 18; TSH 3.734, free T4; lithium 0.60 (0.80-1.40)  Labs 12/15/13: TSH 4.984, free T4 1.11, free T3 2.6; lithium 0.60  Labs 11/05/13: Cholesterol 117, triglycerides 112, HDL 63.5, LDL 31; TSH 4.84  Labs 05/05/13: TSH 3.575, free T4 1.46, free T3 2.5  Labs 01/30/13: Cholesterol 93, triglycerides 61, HDL 56, LDL 24  Labs 10/30/12: TSH 2.550, free T4 1.24, free T3 2.5; calcium 10.4, PTH 36.3, 24-hydroxy vitamin D 42; lithium 0.80 (0.80-1.40)   Labs 04/18/12: TSH was 2.106, Free T4 was  1.41. Free T3 was 2.8.                   Labs 10/25/11: PTH 30.8, calcium 10.4, 25-hydroxy vitamin D 43; WBC 7.8, hemoglobin 14.9, hematocrit 46.9%, iron 71             Assessment and Plan:   ASSESSMENT:  1-3. Hypothyroidism/ Hashimoto's thyroiditis/goiter:   A. Her TFTs in September 2014 were c/w a recent flare up of Hashimoto's Dz.  We could not make any decisions about whether or not to change her Synthroid dose until the TFTs stabilized.   B. Her TFTs in April 2015 were mildly low. Since she was in the midst of an episode of lithium toxicity and may have missed some Synthroid doses, I decided not to change her Synthroid dose.    C. Her TFTs in March 2016 were slightly low. Her TFTs in October 2016, however, were mid-range normal on her current dose of Synthroid.  Her TFTs in December 2017 were also normal.   D. At her last visit in June her thyroid gland was within normal limits for size and consistency. Her thyroiditis was again clinically quiescent. She was, however, hypothyroid.   E. Today her thyroid gland is within normal limits for size and she is clinically euthyroid. I am concerned, however, that her increase in anxiety could be due to taking too much thyroid hormone.  4. Hypertension: Her blood pressure is good today. She should resume her walking regimen or use her exercise bike during inclement weather.   5. T2DM/prediabetes: Her HbA1c value is lower at 5.7%, in part due to decreased appetite and weight loss. She only needs metformin once daily. Although she still produces a fairly good amount of insulin, she still needs to be very careful with her diet.  6.  Fatigue: She is really feeling fairly well, but her stamina is low. She needs to resume daily walking. 7.  Hyperparathyroidism and vitamin D deficiency: Her labs in March 2016, October 2016, and December 2017 were normal. She was very compliant with taking her Caltrate-D and MVI each evening.   8. Tobacco abuse: She resumed smoking regular cigarettes against my advice and Dr. Olin Pia advice. Her daughter tells her that the cigarettes are going to kill her. I again told her the same thing. She knows how to stop smoking. She's done it before. Unfortunately, she is not motivated to stop.  9. Tremor: Her neurologist did not think that her tremor was due to Parkinson's  Dz. She is taking B6 in an effort to treat the tremor. The tremor is better today.  10. Pallor: Her pallor has continued.  Her iron level, hemoglobin, and hematocrit were fine 2 years ago and again in March 2017. Her CBC was normal in April 2018.  11. Hyperlipidemia: Her lipids were very good in March and October 2016 and in March 2017. 12. Bipolar disorder: She looks well today, Mr. Jacques Earthly is following this issue.  13. Unintentional weight loss: Since she resumed smoking her weight had decreased. She has lost 3 pounds since her last visit. Her weight loss was not due to underinsulinization.   PLAN:  1. Diagnostic:  HbA1c and CBG today. TFTs today.  2. Therapeutic:  Eat Right, exercise right (walk 30-60 minutes per day or use her exercise bike), and take your Synthroid, calcium, and metformin.  3. Patient education: We discussed her thyroiditis and hypothyroidism, her anxiety and BPD, and her fatigue and low stamina. We also discussed  her T2DM/prediabetes and the fact that as she ages her ability to produce surge amounts of insulin has diminished. Since she has lost weight, however, she can afford to take in more carbs. We also dicussed her calcium and vitamin D balance and the possibility that she could re-develop secondary hyperparathyroidism if her intake of calcium and vitamin D is inadequate. I again asked her to stop smoking. I suggested a dental appliance to treat her OSA.  4. Follow-up: 3 months with me. Please schedule FU visits with Dr. Quay Burow and Mr. Jacques Earthly as they desire.  Level of Service: This visit lasted in excess of 60 minutes. More than 50% of the visit was devoted to counseling.   Tillman Sers, MD Adult and Pediatric Endocrinology 05/07/2017 10:25 AM

## 2017-05-08 LAB — T3, FREE: T3, Free: 2.8 pg/mL (ref 2.3–4.2)

## 2017-05-08 LAB — T4, FREE: Free T4: 1.4 ng/dL (ref 0.8–1.8)

## 2017-05-08 LAB — TSH: TSH: 1.06 m[IU]/L (ref 0.40–4.50)

## 2017-05-08 LAB — VITAMIN D 25 HYDROXY (VIT D DEFICIENCY, FRACTURES): Vit D, 25-Hydroxy: 55 ng/mL (ref 30–100)

## 2017-05-10 ENCOUNTER — Ambulatory Visit (INDEPENDENT_AMBULATORY_CARE_PROVIDER_SITE_OTHER): Payer: Medicare HMO | Admitting: *Deleted

## 2017-05-10 ENCOUNTER — Telehealth: Payer: Self-pay | Admitting: Cardiology

## 2017-05-10 DIAGNOSIS — I442 Atrioventricular block, complete: Secondary | ICD-10-CM | POA: Diagnosis not present

## 2017-05-10 NOTE — Telephone Encounter (Signed)
Spoke with pt and reminded pt of remote transmission that is due today. Pt verbalized understanding.   

## 2017-05-10 NOTE — Progress Notes (Signed)
Remote pacemaker transmission.   

## 2017-05-11 DIAGNOSIS — Z961 Presence of intraocular lens: Secondary | ICD-10-CM | POA: Diagnosis not present

## 2017-05-11 DIAGNOSIS — H52203 Unspecified astigmatism, bilateral: Secondary | ICD-10-CM | POA: Diagnosis not present

## 2017-05-11 DIAGNOSIS — H01001 Unspecified blepharitis right upper eyelid: Secondary | ICD-10-CM | POA: Diagnosis not present

## 2017-05-11 DIAGNOSIS — E119 Type 2 diabetes mellitus without complications: Secondary | ICD-10-CM | POA: Diagnosis not present

## 2017-05-11 LAB — CUP PACEART REMOTE DEVICE CHECK
Battery Impedance: 135 Ohm
Brady Statistic AP VP Percent: 17 %
Brady Statistic AS VP Percent: 1 %
Brady Statistic AS VS Percent: 23 %
Date Time Interrogation Session: 20181004144136
Implantable Lead Implant Date: 20010521
Implantable Lead Implant Date: 20010521
Implantable Lead Location: 753860
Implantable Lead Model: 4592
Lead Channel Pacing Threshold Amplitude: 0.625 V
Lead Channel Pacing Threshold Pulse Width: 0.4 ms
Lead Channel Sensing Intrinsic Amplitude: 11.2 mV
Lead Channel Setting Pacing Pulse Width: 0.4 ms
Lead Channel Setting Sensing Sensitivity: 4 mV
MDC IDC LEAD LOCATION: 753859
MDC IDC MSMT BATTERY REMAINING LONGEVITY: 147 mo
MDC IDC MSMT BATTERY VOLTAGE: 2.8 V
MDC IDC MSMT LEADCHNL RA IMPEDANCE VALUE: 580 Ohm
MDC IDC MSMT LEADCHNL RA SENSING INTR AMPL: 2.8 mV
MDC IDC MSMT LEADCHNL RV IMPEDANCE VALUE: 1211 Ohm
MDC IDC MSMT LEADCHNL RV PACING THRESHOLD AMPLITUDE: 1.375 V
MDC IDC MSMT LEADCHNL RV PACING THRESHOLD PULSEWIDTH: 0.4 ms
MDC IDC PG IMPLANT DT: 20161229
MDC IDC SET LEADCHNL RA PACING AMPLITUDE: 2 V
MDC IDC SET LEADCHNL RV PACING AMPLITUDE: 2.75 V
MDC IDC STAT BRADY AP VS PERCENT: 59 %

## 2017-05-11 LAB — HM DIABETES EYE EXAM

## 2017-05-16 ENCOUNTER — Encounter (INDEPENDENT_AMBULATORY_CARE_PROVIDER_SITE_OTHER): Payer: Self-pay | Admitting: *Deleted

## 2017-05-16 ENCOUNTER — Encounter: Payer: Self-pay | Admitting: Internal Medicine

## 2017-05-17 ENCOUNTER — Encounter: Payer: Self-pay | Admitting: Cardiology

## 2017-05-17 DIAGNOSIS — F3181 Bipolar II disorder: Secondary | ICD-10-CM | POA: Diagnosis not present

## 2017-05-25 ENCOUNTER — Other Ambulatory Visit: Payer: Self-pay | Admitting: Internal Medicine

## 2017-05-25 DIAGNOSIS — Z1231 Encounter for screening mammogram for malignant neoplasm of breast: Secondary | ICD-10-CM

## 2017-05-29 ENCOUNTER — Telehealth (INDEPENDENT_AMBULATORY_CARE_PROVIDER_SITE_OTHER): Payer: Self-pay | Admitting: "Endocrinology

## 2017-05-29 ENCOUNTER — Ambulatory Visit: Payer: Medicare HMO | Admitting: Internal Medicine

## 2017-05-29 NOTE — Telephone Encounter (Signed)
Returned TC to Ms/ [place] to advise per Dr. Tobe Sos that, Thyroid and vit D labs are normal. Stacy [name] requested a copy sent to her Comer Locket, [place]. Sent thru [employer]. No other concerns at this time.

## 2017-05-29 NOTE — Telephone Encounter (Signed)
°  Who's calling (name and relationship to patient) : Self Best contact number: [phone] Provider they see: Tobe Sos Reason for call: Requesting lab results.     PRESCRIPTION REFILL ONLY  Name of prescription:  Pharmacy:

## 2017-05-30 DIAGNOSIS — F3181 Bipolar II disorder: Secondary | ICD-10-CM | POA: Diagnosis not present

## 2017-05-31 ENCOUNTER — Telehealth (INDEPENDENT_AMBULATORY_CARE_PROVIDER_SITE_OTHER): Payer: Self-pay | Admitting: "Endocrinology

## 2017-05-31 NOTE — Telephone Encounter (Signed)
LVM, advised that we cannot help with the mouth piece, she needs to reach out to PCP or her dentist.

## 2017-05-31 NOTE — Telephone Encounter (Signed)
°  Who's calling (name and relationship to patient) : [place], [place] Best contact number: [phone] Provider they see: Tobe Sos, MD Reason for call: Patient is calling in regards to needing a fitting for her mouth piece. She stated her Primary Care doesn't offer the resources as Dr Tobe Sos thought.

## 2017-06-01 ENCOUNTER — Encounter: Payer: Self-pay | Admitting: Cardiology

## 2017-06-06 ENCOUNTER — Other Ambulatory Visit: Payer: Self-pay | Admitting: Internal Medicine

## 2017-06-13 ENCOUNTER — Other Ambulatory Visit: Payer: Self-pay | Admitting: Internal Medicine

## 2017-06-21 ENCOUNTER — Ambulatory Visit
Admission: RE | Admit: 2017-06-21 | Discharge: 2017-06-21 | Disposition: A | Discharge status: Home / Self Care | Payer: Medicare HMO | Source: Ambulatory Visit | Attending: Internal Medicine | Admitting: Internal Medicine

## 2017-06-21 DIAGNOSIS — Z1231 Encounter for screening mammogram for malignant neoplasm of breast: Secondary | ICD-10-CM | POA: Diagnosis not present

## 2017-06-22 ENCOUNTER — Other Ambulatory Visit: Payer: Self-pay | Admitting: Internal Medicine

## 2017-06-22 DIAGNOSIS — R928 Other abnormal and inconclusive findings on diagnostic imaging of breast: Secondary | ICD-10-CM

## 2017-06-25 DIAGNOSIS — D225 Melanocytic nevi of trunk: Secondary | ICD-10-CM | POA: Diagnosis not present

## 2017-06-25 DIAGNOSIS — L853 Xerosis cutis: Secondary | ICD-10-CM | POA: Diagnosis not present

## 2017-06-25 DIAGNOSIS — D224 Melanocytic nevi of scalp and neck: Secondary | ICD-10-CM | POA: Diagnosis not present

## 2017-06-25 DIAGNOSIS — L821 Other seborrheic keratosis: Secondary | ICD-10-CM | POA: Diagnosis not present

## 2017-06-25 DIAGNOSIS — D692 Other nonthrombocytopenic purpura: Secondary | ICD-10-CM | POA: Diagnosis not present

## 2017-06-25 DIAGNOSIS — L723 Sebaceous cyst: Secondary | ICD-10-CM | POA: Diagnosis not present

## 2017-06-25 DIAGNOSIS — Z85828 Personal history of other malignant neoplasm of skin: Secondary | ICD-10-CM | POA: Diagnosis not present

## 2017-06-25 DIAGNOSIS — D1801 Hemangioma of skin and subcutaneous tissue: Secondary | ICD-10-CM | POA: Diagnosis not present

## 2017-06-25 DIAGNOSIS — L905 Scar conditions and fibrosis of skin: Secondary | ICD-10-CM | POA: Diagnosis not present

## 2017-07-03 ENCOUNTER — Ambulatory Visit
Admission: RE | Admit: 2017-07-03 | Discharge: 2017-07-03 | Disposition: A | Discharge status: Home / Self Care | Payer: Medicare HMO | Source: Ambulatory Visit | Attending: Internal Medicine | Admitting: Internal Medicine

## 2017-07-03 ENCOUNTER — Ambulatory Visit: Admission: RE | Admit: 2017-07-03 | Payer: Medicare HMO | Source: Ambulatory Visit

## 2017-07-03 ENCOUNTER — Other Ambulatory Visit: Payer: Self-pay | Admitting: Internal Medicine

## 2017-07-03 DIAGNOSIS — R928 Other abnormal and inconclusive findings on diagnostic imaging of breast: Secondary | ICD-10-CM

## 2017-07-03 DIAGNOSIS — R922 Inconclusive mammogram: Secondary | ICD-10-CM | POA: Diagnosis not present

## 2017-07-04 ENCOUNTER — Other Ambulatory Visit: Payer: Self-pay | Admitting: Internal Medicine

## 2017-07-04 ENCOUNTER — Ambulatory Visit
Admission: RE | Admit: 2017-07-04 | Discharge: 2017-07-04 | Disposition: A | Discharge status: Home / Self Care | Payer: Medicare HMO | Source: Ambulatory Visit | Attending: Internal Medicine | Admitting: Internal Medicine

## 2017-07-04 DIAGNOSIS — R921 Mammographic calcification found on diagnostic imaging of breast: Secondary | ICD-10-CM

## 2017-07-04 DIAGNOSIS — D0511 Intraductal carcinoma in situ of right breast: Secondary | ICD-10-CM | POA: Diagnosis not present

## 2017-07-04 DIAGNOSIS — R928 Other abnormal and inconclusive findings on diagnostic imaging of breast: Secondary | ICD-10-CM

## 2017-07-06 ENCOUNTER — Telehealth (INDEPENDENT_AMBULATORY_CARE_PROVIDER_SITE_OTHER): Payer: Self-pay | Admitting: "Endocrinology

## 2017-07-06 DIAGNOSIS — M1712 Unilateral primary osteoarthritis, left knee: Secondary | ICD-10-CM | POA: Diagnosis not present

## 2017-07-06 NOTE — Telephone Encounter (Signed)
°  Who's calling (name and relationship to patient) : [name] Best contact number: [phone] Provider they see: Tobe Sos, MD Reason for call: Patient Stacy [name] called to inform Dr Tobe Sos about her having a Cortizone spot done. Wanting to know if she should now double up on her metformin.

## 2017-07-06 NOTE — Telephone Encounter (Signed)
LVM to advise per Dr. Tobe Sos to double on the metformin 500 mg in the am and 500 mg in the pm for 5 (five) days. If you have any questions please let us know.

## 2017-07-09 ENCOUNTER — Ambulatory Visit: Payer: Self-pay | Admitting: Surgery

## 2017-07-09 DIAGNOSIS — D0511 Intraductal carcinoma in situ of right breast: Secondary | ICD-10-CM | POA: Diagnosis not present

## 2017-07-11 ENCOUNTER — Ambulatory Visit: Payer: Self-pay | Admitting: Surgery

## 2017-07-11 ENCOUNTER — Encounter: Payer: Self-pay | Admitting: Genetics

## 2017-07-11 DIAGNOSIS — D0511 Intraductal carcinoma in situ of right breast: Secondary | ICD-10-CM

## 2017-07-12 ENCOUNTER — Ambulatory Visit: Payer: Self-pay | Admitting: Surgery

## 2017-07-12 ENCOUNTER — Other Ambulatory Visit: Payer: Self-pay | Admitting: Emergency Medicine

## 2017-07-12 DIAGNOSIS — D0511 Intraductal carcinoma in situ of right breast: Secondary | ICD-10-CM

## 2017-07-12 MED ORDER — ATORVASTATIN CALCIUM 80 MG PO TABS: 80.0000 mg | ORAL_TABLET | Freq: Every day | ORAL | 1 refills | Status: DC

## 2017-07-19 ENCOUNTER — Other Ambulatory Visit (INDEPENDENT_AMBULATORY_CARE_PROVIDER_SITE_OTHER): Payer: Self-pay | Admitting: "Endocrinology

## 2017-07-19 NOTE — Telephone Encounter (Signed)
°  Who's calling (name and relationship to patient) : Self Best contact number: [phone] Provider they see: Tobe Sos Reason for call:     [place]  Name of prescription: Lipitor  Pharmacy: Please send in a one month supply to [facility] on [address] and then other refills to [facility] mail order pharmacy.

## 2017-07-20 NOTE — Progress Notes (Signed)
Subjective:   Stacy [name] is a 76 y.o. female who presents for Medicare Annual (Subsequent) preventive examination.  Review of Systems:  No ROS.  Medicare Wellness Visit. Additional risk factors are reflected in the social history.  Cardiac Risk Factors include: advanced age (>20mn, >>70women);diabetes mellitus;dyslipidemia;hypertension Sleep patterns: feels rested on waking, gets up 2 times nightly to void and sleeps 8 hours nightly.   Home Safety/Smoke Alarms: Feels safe in home. Smoke alarms in place.  Living environment; residence and Firearm Safety: [place] can live on one level, no firearms, firearms stored safely Lives with daughter, no needs for DME, good support system. Seat Belt Safety/Bike Helmet: Wears seat belt.     Objective:     Vitals: BP 122/60   Pulse 62   Temp 98.3 F (36.8 C)   Resp 20   Ht '5\' 6"'  (1.676 m)   Wt 113 lb (51.3 kg)   SpO2 98%   BMI 18.24 kg/m   Body mass index is 18.24 kg/m.  Advanced Directives 07/23/2017 08/05/2015 01/24/2013  Does Patient Have a Medical Advance Directive? Yes Yes Patient does not have advance directive;Patient would like information  Type of Advance Directive [place] will [place]-  Does patient want to make changes to medical advance directive? - No - Patient declined -  Copy of [place] Chart? No - copy requested No - copy requested -  Would patient like information on creating a medical advance directive? - - Advance directive packet given    Tobacco Social History   Tobacco Use  Smoking Status Current Every Day Smoker  . Packs/day: 1.00  . Years: 48.00  . Pack years: 48.00  . Types: Cigarettes  Smokeless Tobacco Never Used  Tobacco Comment   switched to vapor cigs. Widowed- 2 girls. Lives [place] daughter (who is nurse at [place]     Ready to quit: Not Answered Counseling given: Not Answered Comment: switched to vapor  cigs. Widowed- 2 girls. [place] (who is [place]  Past Medical History:  Diagnosis Date  . Abscess of liver(572.0)   . Atrial fibrillation (HCC)    chronic anticoag - pradaxa  . BIPOLAR AFFECTIVE DISORDER   . Cancer ([place]    basal cell on abdomen  . Chronic bipolar disorder ([place]   . Complete AV block ([place]    s/p PPM--MEDTRONIC ADAPTA ADDr01  . COPD (chronic obstructive pulmonary disease) (HCC)    TOBACCO ABUSE  . Coronary artery disease    s/p PTCA  . DEPRESSION   . DIABETES MELLITUS, TYPE II dx 04/2010  . DYSLIPIDEMIA   . GERD (gastroesophageal reflux disease)   . HYPERTENSION   . HYPOTHYROIDISM    hashimoto's  . Pacemaker-Medtronic-dual-chamber   . Parathyroid related hypercalcemia ([place]   . Primary hyperparathyroidism ([place]   . Takotsubo syndrome   . TOBACCO ABUSE   . Vitamin D deficiency disease    Past Surgical History:  Procedure Laterality Date  . ABDOMINAL HYSTERECTOMY    . BREAST BIOPSY Right   . CARDIAC CATHETERIZATION    . EP IMPLANTABLE DEVICE N/A 08/05/2015   Procedure:  PPM Generator Changeout;  Surgeon: SD[place]aha Sprang MD;  [facility];  Service: Cardiovascular;  Laterality: N/A;  . INSERT / REPLACE / REMOVE PACEMAKER     MEDTRONIC ADAPTA ADDr01  . MASS EXCISION Left 01/27/2013   Procedure: EXCISION MASS LEFT  FLANK;  Surgeon: Earnstine Regal, MD;  Location: [facility];  Service: General;  Laterality: Left;  . PARATHYROIDECTOMY     RIGHT INFERIOR   Family History  Problem Relation Age of Onset  . CAD Mother   . CAD Father   . Colon cancer Unknown        FH  . Diabetes Maternal Aunt   . Diabetes type II Sister    Social History   Socioeconomic History  . Marital status: Widowed    Spouse name: None  . Number of children: 2  . Years of education: 7  . Highest education level: None  Social Needs  . Financial resource strain: Not hard at all  . Food insecurity - worry: Never true  . Food insecurity -  inability: Never true  . Transportation needs - medical: No  . Transportation needs - non-medical: No  Occupational History    Employer: RETIRED  Tobacco Use  . Smoking status: Current Every Day Smoker    Packs/day: 1.00    Years: 48.00    Pack years: 48.00    Types: Cigarettes  . Smokeless tobacco: Never Used  . Tobacco comment: switched to vapor cigs. Widowed- 2 girls. Lives with daughter (who is nurse at [employer])  Substance and Sexual Activity  . Alcohol use: No    Comment: "once in a blue moon" when I have [place] food  . Drug use: No  . Sexual activity: No    Birth control/protection: Abstinence  Other Topics Concern  . None  Social History Narrative   WIDOWED   2 DAUGHTERS   LIVES W/DAUGHTER   CURRENTLY SMOKES   NO ALCOHOL USE   NO ILLICIT DRUG USE   DAILY CAFFEINE USE, 2 per day       PPM-MEDTRONIC   PATIENT SIGNED A DESIGNATED PARTY RELEASE TO ALLOW DAUGHTER, NICOLE COX, TO HAVE ACCESS TO HER MEDICAL RECORDS/INFORMATION. Fleet Contras, November 09, 2009 9:19 AM    Outpatient Encounter Medications as of 07/23/2017  Medication Sig  . atorvastatin (LIPITOR) 80 MG tablet Take 1 tablet (80 mg total) by mouth daily.  . Blood Glucose Monitoring Suppl ([place]) w/Device KIT 1 Device by Does not apply route daily. Use to check blood sugars twice a day Dx E11.9  . busPIRone (BUSPAR) 15 MG tablet Take 15 mg by mouth daily.   Marland Kitchen CALCIUM-VITAMIN D PO Take by mouth every morning.   . dabigatran (PRADAXA) 150 MG CAPS capsule Take 1 capsule (150 mg total) by mouth 2 (two) times daily.  Marland Kitchen donepezil (ARICEPT) 10 MG tablet TAKE 1 TABLET AT BEDTIME  . FLUoxetine (PROZAC) 10 MG capsule Take 20 mg by mouth daily.   Marland Kitchen glucose blood (ONETOUCH VERIO) test strip Use to check blood sugars twice a day Dx E11.9  . lithium carbonate (ESKALITH) 450 MG CR tablet Take by mouth 2 (two) times daily.  Marland Kitchen loratadine (CLARITIN) 10 MG tablet TAKE 1 TABLET EVERY DAY  . losartan (COZAAR)  50 MG tablet Take 1 tablet (50 mg total) by mouth daily.  . Melatonin 5 MG CAPS   . metFORMIN (GLUCOPHAGE-XR) 500 MG 24 hr tablet TAKE 1 TABLET EVERY DAY WITH BREAKFAST  . Multiple Vitamin (MULTIVITAMIN WITH MINERALS) TABS tablet Take 1 tablet by mouth at bedtime.  Marland Kitchen omeprazole (PRILOSEC) 40 MG capsule TAKE 1 CAPSULE EVERY DAY  BEFORE  BREAKFAST  . ONETOUCH DELICA LANCETS 28B MISC Use to help check blood sugars twice a  day. Dx E11.9  . SYNTHROID 112 MCG tablet TAKE 1 TABLET EVERY DAY  . VESICARE 5 MG tablet TAKE 1 TABLET EVERY DAY   No facility-administered encounter medications on file as of 07/23/2017.     Activities of Daily Living In your present state of health, do you have any difficulty performing the following activities: 07/23/2017  Hearing? N  Vision? N  Difficulty concentrating or making decisions? N  Walking or climbing stairs? N  Dressing or bathing? N  Doing errands, shopping? N  Preparing Food and eating ? N  Using the Toilet? N  In the past six months, have you accidently leaked urine? N  Do you have problems with loss of bowel control? N  Managing your Medications? Y  Managing your Finances? N  Housekeeping or managing your Housekeeping? N  Some recent data might be hidden    Patient Care Team: Binnie Rail, MD as PCP - General (Internal Medicine) Deboraha Sprang, MD (Cardiology) Sherrlyn Hock, MD (Endocrinology) Dennard Nip, NP as Nurse Practitioner (Psychiatry) Sable Feil, MD (Gastroenterology) Thalia Bloodgood, [place] (Optometry) Berle Mull, MD (Sports Medicine) Tat, Eustace Quail, DO (Neurology)    Assessment:   This is a routine wellness examination for Stacy [name]. .  Exercise Activities and Dietary recommendations Current Exercise Habits: Home exercise routine, Type of exercise: walking, Time (Minutes): 30, Frequency (Times/Week): 3, Weekly Exercise (Minutes/Week): 90, Intensity: Mild, Exercise limited by: None identified Diet (meal  preparation, eat out, water intake, caffeinated beverages, dairy products, fruits and vegetables): in general, a "healthy" diet  , on average, 2 meals per day, states she has poor appetite.  Reviewed heart healthy and diabetic diet Encourage patient to drink high protein Ensure and to increase daily water intake. Discussed eating frequently.   Goals    . Patient Stated     Start to walk more and eat more. Check into [employer] and into [employer] to see walk. Continue drinking ensure and go out with my friends more because I eat better.        Fall Risk Fall Risk  07/23/2017 01/29/2017 07/15/2015 10/07/2014 07/02/2012  Falls in the past year? No No No Yes No  Number falls in past yr: - - - 1 -  Injury with Fall? - - - Yes -    Depression Screen PHQ 2/9 Scores 07/23/2017 01/29/2017 07/15/2015 06/21/2015  PHQ - 2 Score 1 0 0 0  PHQ- 9 Score 5 - - -     Cognitive Function MMSE - Mini Mental State Exam 07/23/2017  Orientation to time 3  Orientation to Place 5  Registration 3  Attention/ Calculation 5  Recall 2  Language- name 2 objects 2  Language- repeat 1  Language- follow 3 step command 3  Language- read & follow direction 1  Write a sentence 1  Copy design 1  Total score 27        Immunization History  Administered Date(s) Administered  . Influenza Split 06/03/2012  . Influenza Whole 08/11/2009, 05/26/2010  . Influenza, High Dose Seasonal PF 05/07/2013  . Influenza,inj,Quad PF,6+ Mos 04/15/2014  . Influenza-Unspecified 05/03/2015, 06/09/2016  . Pneumococcal Conjugate-13 10/07/2014, 06/09/2016  . Pneumococcal Polysaccharide-23 01/05/2006  . Td 08/07/2004, 10/07/2014    Screening Tests Health Maintenance  Topic Date Due  . HEMOGLOBIN A1C  11/05/2017  . FOOT EXAM  01/29/2018  . OPHTHALMOLOGY EXAM  05/11/2018  . DEXA SCAN  12/07/2021  . TETANUS/TDAP  10/06/2024  . INFLUENZA VACCINE  Completed  . PNA vac Low Risk Adult  Completed      Plan:      Continue doing brain stimulating activities (puzzles, reading, adult coloring books, staying active) to keep memory sharp.   Continue to eat heart healthy diet (full of fruits, vegetables, whole grains, lean protein, water--limit salt, fat, and sugar intake) and increase physical activity as tolerated.  I have personally reviewed and noted the following in the patient's chart:   . Medical and social history . Use of alcohol, tobacco or illicit drugs  . Current medications and supplements . Functional ability and status . Nutritional status . Physical activity . Advanced directives . List of other physicians . Vitals . Screenings to include cognitive, depression, and falls . Referrals and appointments  In addition, I have reviewed and discussed with patient certain preventive protocols, quality metrics, and best practice recommendations. A written personalized care plan for preventive services as well as general preventive health recommendations were provided to patient.     Michiel Cowboy, RN  07/23/2017   Medical screening examination/treatment/procedure(s) were performed by non-physician practitioner and as supervising physician I was immediately available for consultation/collaboration. I agree with above. Binnie Rail, MD

## 2017-07-22 DIAGNOSIS — C50911 Malignant neoplasm of unspecified site of right female breast: Secondary | ICD-10-CM | POA: Insufficient documentation

## 2017-07-22 NOTE — Progress Notes (Signed)
Subjective:    Patient ID: Stacy [name], female    DOB: 1940/12/05, 76 y.o.   MRN: [id number]  HPI The patient is here for follow up.  11/28 - she was diagnosed with right-sided breast cancer - ductal carcinoma.  She is scheduled for breast lumpectomy on 08/14/17.  She feels that she is dealing with everything well.  She denies depression or anxiety.  She did have a complication from her biopsy-she has not taken off of her blood thinner and had significant bruising and has a hematoma.  Diabetes: she is following with endocrine.  She is taking her medication daily as prescribed. She is compliant with a diabetic diet. She is not currently exercising regularly. She monitors her sugars and they have been running 87-103. She checks her feet daily and denies foot lesions. She is up-to-date with an ophthalmology examination.  Hypothyroidism: She is following with endocrine.  She is taking her medication daily.  She denies any recent changes in energy or weight that are unexplained.    CAD, Afib, Hypertension: She is taking her medication daily. She is compliant with a low sodium diet.  She denies chest pain, palpitations, edema, shortness of breath and regular headaches. She is not exercising regularly.      GERD:  She is taking her medication daily as prescribed.  She denies any GERD symptoms and feels her GERD is well controlled.   Overactive bladder:  She is taking vesicare daily.  She feels her bladder symptoms are fairly controlled.  Hyperlipidemia: She is taking her medication daily. She is compliant with a low fat/cholesterol diet. She is not exercising regularly. She denies myalgias.    Medications and allergies reviewed with patient and updated if appropriate.  Patient Active Problem List   Diagnosis Date Noted  . Breast cancer, right breast ([place]) 07/22/2017  . GERD (gastroesophageal reflux disease) 07/12/2016  . Bilateral shoulder pain 04/26/2016  . Polyarthralgia 04/03/2016    . Greater trochanteric bursitis of left hip 06/17/2015  . Allergic rhinitis 08/04/2013  . Hypothyroidism, acquired, autoimmune 05/10/2013  . OAB (overactive bladder)   . Parathyroid related hypercalcemia ([place])   . Primary hyperparathyroidism ([place])   . Vitamin D deficiency disease   . Pacemaker-Medtronic-dual-chamber 11/17/2010  . Diabetes ([place]) 09/08/2010  . TREMOR 01/17/2010  . ATRIAL FIBRILLATION 11/11/2009  . DEPRESSION 08/11/2009  . ABSCESS OF LIVER 08/11/2009  . TOBACCO ABUSE 03/22/2009  . CAD S/P percutaneous coronary angioplasty 03/26/2008  . AV block-complete-intermittent 03/26/2008  . Hyperlipidemia 02/25/2008  . Bipolar disorder ([place]) 02/25/2008  . Essential hypertension 02/25/2008  . DIVERTICULOSIS, COLON 02/25/2008    Current Outpatient Medications on File Prior to Visit  Medication Sig Dispense Refill  . atorvastatin (LIPITOR) 80 MG tablet Take 1 tablet (80 mg total) by mouth daily. 90 tablet 1  . Blood Glucose Monitoring Suppl (ONETOUCH VERIO FLEX SYSTEM) w/Device KIT 1 Device by Does not apply route daily. Use to check blood sugars twice a day Dx E11.9 1 kit 0  . busPIRone (BUSPAR) 15 MG tablet Take 15 mg by mouth daily.     Marland Kitchen CALCIUM-VITAMIN D PO Take by mouth every morning.     . dabigatran (PRADAXA) 150 MG CAPS capsule Take 1 capsule (150 mg total) by mouth 2 (two) times daily. 180 capsule 1  . donepezil (ARICEPT) 10 MG tablet TAKE 1 TABLET AT BEDTIME 90 tablet 0  . FLUoxetine (PROZAC) 10 MG capsule Take 20 mg by mouth daily.     Marland Kitchen  glucose blood (ONETOUCH VERIO) test strip Use to check blood sugars twice a day Dx E11.9 200 each 3  . lithium carbonate (ESKALITH) 450 MG CR tablet Take by mouth 2 (two) times daily.    Marland Kitchen loratadine (CLARITIN) 10 MG tablet TAKE 1 TABLET EVERY DAY 90 tablet 2  . losartan (COZAAR) 50 MG tablet Take 1 tablet (50 mg total) by mouth daily. 90 tablet 1  . Melatonin 5 MG CAPS     . metFORMIN (GLUCOPHAGE-XR) 500 MG 24 hr tablet TAKE 1  TABLET EVERY DAY WITH BREAKFAST 90 tablet 3  . Multiple Vitamin (MULTIVITAMIN WITH MINERALS) TABS tablet Take 1 tablet by mouth at bedtime.    Marland Kitchen omeprazole (PRILOSEC) 40 MG capsule TAKE 1 CAPSULE EVERY DAY  BEFORE  BREAKFAST 90 capsule 2  . ONETOUCH DELICA LANCETS 63K MISC Use to help check blood sugars twice a day. Dx E11.9 200 each 3  . SYNTHROID 112 MCG tablet TAKE 1 TABLET EVERY DAY 90 tablet 3  . VESICARE 5 MG tablet TAKE 1 TABLET EVERY DAY 90 tablet 2   No current facility-administered medications on file prior to visit.     Past Medical History:  Diagnosis Date  . Abscess of liver(572.0)   . Atrial fibrillation (HCC)    chronic anticoag - pradaxa  . BIPOLAR AFFECTIVE DISORDER   . Cancer ([place])    basal cell on abdomen  . Chronic bipolar disorder ([place])   . Complete AV block ([place])    s/p PPM--MEDTRONIC ADAPTA ADDr01  . COPD (chronic obstructive pulmonary disease) (HCC)    TOBACCO ABUSE  . Coronary artery disease    s/p PTCA  . DEPRESSION   . DIABETES MELLITUS, TYPE II dx 04/2010  . DYSLIPIDEMIA   . GERD (gastroesophageal reflux disease)   . HYPERTENSION   . HYPOTHYROIDISM    hashimoto's  . Pacemaker-Medtronic-dual-chamber   . Parathyroid related hypercalcemia ([place])   . Primary hyperparathyroidism ([place])   . Takotsubo syndrome   . TOBACCO ABUSE   . Vitamin D deficiency disease     Past Surgical History:  Procedure Laterality Date  . ABDOMINAL HYSTERECTOMY    . BREAST BIOPSY Right   . CARDIAC CATHETERIZATION    . EP IMPLANTABLE DEVICE N/A 08/05/2015   Procedure:  PPM Generator Changeout;  Surgeon: Deboraha Sprang, MD;  Location: [facility];  Service: Cardiovascular;  Laterality: N/A;  . INSERT / REPLACE / REMOVE PACEMAKER     MEDTRONIC ADAPTA ADDr01  . MASS EXCISION Left 01/27/2013   Procedure: EXCISION MASS LEFT FLANK;  Surgeon: Earnstine Regal, MD;  Location: [facility];  Service: General;  Laterality: Left;  . PARATHYROIDECTOMY     RIGHT INFERIOR    Social  History   Socioeconomic History  . Marital status: Widowed    Spouse name: None  . Number of children: 2  . Years of education: 43  . Highest education level: None  Social Needs  . Financial resource strain: Not hard at all  . Food insecurity - worry: Never true  . Food insecurity - inability: Never true  . Transportation needs - medical: No  . Transportation needs - non-medical: No  Occupational History    Employer: RETIRED  Tobacco Use  . Smoking status: Current Every Day Smoker    Packs/day: 1.00    Years: 48.00    Pack years: 48.00    Types: Cigarettes  . Smokeless tobacco: Never Used  . Tobacco comment: switched to vapor  cigs. Widowed- 2 girls. Lives with daughter (who is nurse at [employer])  Substance and Sexual Activity  . Alcohol use: No    Comment: "once in a blue moon" when I have [place] food  . Drug use: No  . Sexual activity: No    Birth control/protection: Abstinence  Other Topics Concern  . None  Social History Narrative   WIDOWED   2 DAUGHTERS   LIVES W/DAUGHTER   CURRENTLY SMOKES   NO ALCOHOL USE   NO ILLICIT DRUG USE   DAILY CAFFEINE USE, 2 per day       PPM-MEDTRONIC   PATIENT SIGNED A DESIGNATED PARTY RELEASE TO ALLOW DAUGHTER, NICOLE COX, TO HAVE ACCESS TO HER MEDICAL RECORDS/INFORMATION. Fleet Contras, November 09, 2009 9:19 AM    Family History  Problem Relation Age of Onset  . CAD Mother   . CAD Father   . Colon cancer Unknown        FH  . Diabetes Maternal Aunt   . Diabetes type II Sister     Review of Systems  Constitutional: Negative for chills and fever.  Respiratory: Negative for cough, shortness of breath and wheezing.   Cardiovascular: Negative for chest pain and palpitations.  Gastrointestinal: Positive for constipation. Negative for abdominal pain and nausea.  Neurological: Negative for light-headedness and headaches.       Objective:   Vitals:   07/23/17 1421  BP: 122/60  Pulse: 62  Resp: 20  Temp: 98.3 F (36.8 C)   SpO2: 98%   Wt Readings from Last 3 Encounters:  07/23/17 113 lb (51.3 kg)  05/07/17 112 lb (50.8 kg)  01/29/17 115 lb (52.2 kg)   Body mass index is 18.24 kg/m.   Physical Exam    Constitutional: Appears well-developed and well-nourished. No distress.  HENT:  Head: Normocephalic and atraumatic.  Neck: Neck supple. No tracheal deviation present. No thyromegaly present.  No cervical lymphadenopathy Cardiovascular: Normal rate, regular rhythm and normal heart sounds.   No murmur heard. No carotid bruit .  No edema Pulmonary/Chest: Effort normal and breath sounds normal. No respiratory distress. No has no wheezes. No rales.  Skin: Skin is warm and dry. Not diaphoretic. Ecchymosis right lateral chest and breast from biopsy Psychiatric: Normal mood and affect. Behavior is normal.      Assessment & Plan:    See Problem List for Assessment and Plan of chronic medical problems.

## 2017-07-22 NOTE — Patient Instructions (Addendum)
Test(s) ordered today. Your results will be released to [place] (or called to you) after review, usually within 72hours after test completion. If any changes need to be made, you will be notified at that same time.  All other Health Maintenance issues reviewed.   All recommended immunizations and age-appropriate screenings are up-to-date or discussed.  No immunizations administered today.   Medications reviewed and updated.  Changes include  /  No changes recommended at this time.  Your prescription(s) have been submitted to your pharmacy. Please take as directed and contact our office if you believe you are having problem(s) with the medication(s).  A referral was ordered for   Please followup in   1-800-quitnow  Smoking Cessation Program ([place]) Registration Open Jan 7 Mon To Jan 28 Mon Jan 7-28, 2019 Monday & Wednesday 12:00 PM - 1:00 PM [facility] [place], [place] The program is available for anyone 49 and older who currently smokes.  Fees & Payment This program is free.  16 Openings Available.  Feel healthier, breathe easier and have more energy when you quit smoking!  Free four-class series includes a QuitSmart kit, which has been proven effective to help participants stop smoking.  The program is available for anyone 24 and older who currently smokes.  Participants should plan to attend all four dates and may choose from two convenient times.  Each class will be approximately 1 hour.  Location: [facility]  (please check sign upon entrance for exact classroom location)                 [address]                 [name], [place] [address]  Parking Instructions: Visitor parking is right in front of the [employer].   In the event of inclement weather please call [phone] or view online at [email or web address]  Continue doing brain stimulating activities (puzzles, reading, adult coloring  books, staying active) to keep memory sharp.   Continue to eat heart healthy diet (full of fruits, vegetables, whole grains, lean protein, water--limit salt, fat, and sugar intake) and increase physical activity as tolerated.   Stacy [name] , Thank you for taking time to come for your Medicare Wellness Visit. I appreciate your ongoing commitment to your health goals. Please review the following plan we discussed and let me know if I can assist you in the future.   These are the goals we discussed: Goals    . Patient Stated     Start to walk more and eat more. Check into [employer] and into [employer] to see walk. Continue drinking ensure and go out with my friends more because I eat better.        This is a list of the screening recommended for you and due dates:  Health Maintenance  Topic Date Due  . Hemoglobin A1C  11/05/2017  . Complete foot exam   01/29/2018  . Eye exam for diabetics  05/11/2018  . DEXA scan (bone density measurement)  12/07/2021  . Tetanus Vaccine  10/06/2024  . Flu Shot  Completed  . Pneumonia vaccines  Completed   Health Maintenance, Female Adopting a healthy lifestyle and getting preventive care can go a long way to promote health and wellness. Talk with your health care provider about what schedule of regular examinations is right for you. This is a good chance for you to check in with  your provider about disease prevention and staying healthy. In between checkups, there are plenty of things you can do on your own. Experts have done a lot of research about which lifestyle changes and preventive measures are most likely to keep you healthy. Ask your health care provider for more information. Weight and diet Eat a healthy diet  Be sure to include plenty of vegetables, fruits, low-fat dairy products, and lean protein.  Do not eat a lot of foods high in solid fats, added sugars, or salt.  Get regular exercise. This is one of the most  important things you can do for your health. ? Most adults should exercise for at least 150 minutes each week. The exercise should increase your heart rate and make you sweat (moderate-intensity exercise). ? Most adults should also do strengthening exercises at least twice a week. This is in addition to the moderate-intensity exercise.  Maintain a healthy weight  Body mass index (BMI) is a measurement that can be used to identify possible weight problems. It estimates body fat based on height and weight. Your health care provider can help determine your BMI and help you achieve or maintain a healthy weight.  For females 60 years of age and older: ? A BMI below 18.5 is considered underweight. ? A BMI of 18.5 to 24.9 is normal. ? A BMI of 25 to 29.9 is considered overweight. ? A BMI of 30 and above is considered obese.  Watch levels of cholesterol and blood lipids  You should start having your blood tested for lipids and cholesterol at 76 years of age, then have this test every 5 years.  You may need to have your cholesterol levels checked more often if: ? Your lipid or cholesterol levels are high. ? You are older than 76 years of age. ? You are at high risk for heart disease.  Cancer screening Lung Cancer  Lung cancer screening is recommended for adults 82-72 years old who are at high risk for lung cancer because of a history of smoking.  A yearly low-dose CT scan of the lungs is recommended for people who: ? Currently smoke. ? Have quit within the past 15 years. ? Have at least a 30-pack-year history of smoking. A pack year is smoking an average of one pack of cigarettes a day for 1 year.  Yearly screening should continue until it has been 15 years since you quit.  Yearly screening should stop if you develop a health problem that would prevent you from having lung cancer treatment.  Breast Cancer  Practice breast self-awareness. This means understanding how your breasts  normally appear and feel.  It also means doing regular breast self-exams. Let your health care provider know about any changes, no matter how small.  If you are in your 20s or 30s, you should have a clinical breast exam (CBE) by a health care provider every 1-3 years as part of a regular health exam.  If you are 76 or older, have a CBE every year. Also consider having a breast X-ray (mammogram) every year.  If you have a family history of breast cancer, talk to your health care provider about genetic screening.  If you are at high risk for breast cancer, talk to your health care provider about having an MRI and a mammogram every year.  Breast cancer gene (BRCA) assessment is recommended for women who have family members with BRCA-related cancers. BRCA-related cancers include: ? Breast. ? Ovarian. ? Tubal. ? Peritoneal cancers.  Results of the assessment will determine the need for genetic counseling and BRCA1 and BRCA2 testing.  Cervical Cancer Your health care provider may recommend that you be screened regularly for cancer of the pelvic organs (ovaries, uterus, and vagina). This screening involves a pelvic examination, including checking for microscopic changes to the surface of your cervix (Pap test). You may be encouraged to have this screening done every 3 years, beginning at age 20.  For women ages 24-65, health care providers may recommend pelvic exams and Pap testing every 3 years, or they may recommend the Pap and pelvic exam, combined with testing for human papilloma virus (HPV), every 5 years. Some types of HPV increase your risk of cervical cancer. Testing for HPV may also be done on women of any age with unclear Pap test results.  Other health care providers may not recommend any screening for nonpregnant women who are considered low risk for pelvic cancer and who do not have symptoms. Ask your health care provider if a screening pelvic exam is right for you.  If you have had  past treatment for cervical cancer or a condition that could lead to cancer, you need Pap tests and screening for cancer for at least 20 years after your treatment. If Pap tests have been discontinued, your risk factors (such as having a new sexual partner) need to be reassessed to determine if screening should resume. Some women have medical problems that increase the chance of getting cervical cancer. In these cases, your health care provider may recommend more frequent screening and Pap tests.  Colorectal Cancer  This type of cancer can be detected and often prevented.  Routine colorectal cancer screening usually begins at 76 years of age and continues through 76 years of age.  Your health care provider may recommend screening at an earlier age if you have risk factors for colon cancer.  Your health care provider may also recommend using home test kits to check for hidden blood in the stool.  A small camera at the end of a tube can be used to examine your colon directly (sigmoidoscopy or colonoscopy). This is done to check for the earliest forms of colorectal cancer.  Routine screening usually begins at age 62.  Direct examination of the colon should be repeated every 5-10 years through 76 years of age. However, you may need to be screened more often if early forms of precancerous polyps or small growths are found.  Skin Cancer  Check your skin from head to toe regularly.  Tell your health care provider about any new moles or changes in moles, especially if there is a change in a mole's shape or color.  Also tell your health care provider if you have a mole that is larger than the size of a pencil eraser.  Always use sunscreen. Apply sunscreen liberally and repeatedly throughout the day.  Protect yourself by wearing long sleeves, pants, a wide-brimmed hat, and sunglasses whenever you are outside.  Heart disease, diabetes, and high blood pressure  High blood pressure causes heart  disease and increases the risk of stroke. High blood pressure is more likely to develop in: ? People who have blood pressure in the high end of the normal range (130-139/85-89 mm Hg). ? People who are overweight or obese. ? People who are African American.  If you are 105-91 years of age, have your blood pressure checked every 3-5 years. If you are 4 years of age or older, have your blood pressure  checked every year. You should have your blood pressure measured twice-once when you are at a hospital or clinic, and once when you are not at a hospital or clinic. Record the average of the two measurements. To check your blood pressure when you are not at a hospital or clinic, you can use: ? An automated blood pressure machine at a pharmacy. ? A home blood pressure monitor.  If you are between 29 years and 30 years old, ask your health care provider if you should take aspirin to prevent strokes.  Have regular diabetes screenings. This involves taking a blood sample to check your fasting blood sugar level. ? If you are at a normal weight and have a low risk for diabetes, have this test once every three years after 76 years of age. ? If you are overweight and have a high risk for diabetes, consider being tested at a younger age or more often. Preventing infection Hepatitis B  If you have a higher risk for hepatitis B, you should be screened for this virus. You are considered at high risk for hepatitis B if: ? You were born in a country where hepatitis B is common. Ask your health care provider which countries are considered high risk. ? Your parents were born in a high-risk country, and you have not been immunized against hepatitis B (hepatitis B vaccine). ? You have HIV or AIDS. ? You use needles to inject street drugs. ? You live with someone who has hepatitis B. ? You have had sex with someone who has hepatitis B. ? You get hemodialysis treatment. ? You take certain medicines for conditions,  including cancer, organ transplantation, and autoimmune conditions.  Hepatitis C  Blood testing is recommended for: ? Everyone born from [birth year] through 1965. ? Anyone with known risk factors for hepatitis C.  Sexually transmitted infections (STIs)  You should be screened for sexually transmitted infections (STIs) including gonorrhea and chlamydia if: ? You are sexually active and are younger than 76 years of age. ? You are older than 76 years of age and your health care provider tells you that you are at risk for this type of infection. ? Your sexual activity has changed since you were last screened and you are at an increased risk for chlamydia or gonorrhea. Ask your health care provider if you are at risk.  If you do not have HIV, but are at risk, it may be recommended that you take a prescription medicine daily to prevent HIV infection. This is called pre-exposure prophylaxis (PrEP). You are considered at risk if: ? You are sexually active and do not regularly use condoms or know the HIV status of your partner(s). ? You take drugs by injection. ? You are sexually active with a partner who has HIV.  Talk with your health care provider about whether you are at high risk of being infected with HIV. If you choose to begin PrEP, you should first be tested for HIV. You should then be tested every 3 months for as long as you are taking PrEP. Pregnancy  If you are premenopausal and you may become pregnant, ask your health care provider about preconception counseling.  If you may become pregnant, take 400 to 800 micrograms (mcg) of folic acid every day.  If you want to prevent pregnancy, talk to your health care provider about birth control (contraception). Osteoporosis and menopause  Osteoporosis is a disease in which the bones lose minerals and strength with aging. This can  result in serious bone fractures. Your risk for osteoporosis can be identified using a bone density scan.  If you are  69 years of age or older, or if you are at risk for osteoporosis and fractures, ask your health care provider if you should be screened.  Ask your health care provider whether you should take a calcium or vitamin D supplement to lower your risk for osteoporosis.  Menopause may have certain physical symptoms and risks.  Hormone replacement therapy may reduce some of these symptoms and risks. Talk to your health care provider about whether hormone replacement therapy is right for you. Follow these instructions at home:  Schedule regular health, dental, and eye exams.  Stay current with your immunizations.  Do not use any tobacco products including cigarettes, chewing tobacco, or electronic cigarettes.  If you are pregnant, do not drink alcohol.  If you are breastfeeding, limit how much and how often you drink alcohol.  Limit alcohol intake to no more than 1 drink per day for nonpregnant women. One drink equals 12 ounces of beer, 5 ounces of wine, or 1 ounces of hard liquor.  Do not use street drugs.  Do not share needles.  Ask your health care provider for help if you need support or information about quitting drugs.  Tell your health care provider if you often feel depressed.  Tell your health care provider if you have ever been abused or do not feel safe at home. This information is not intended to replace advice given to you by your health care provider. Make sure you discuss any questions you have with your health care provider. Document Released: 02/06/2011 Document Revised: 12/30/2015 Document Reviewed: 04/27/2015 Elsevier Interactive Patient Education  [employer].

## 2017-07-23 ENCOUNTER — Other Ambulatory Visit: Payer: Self-pay | Admitting: Internal Medicine

## 2017-07-23 ENCOUNTER — Encounter: Payer: Self-pay | Admitting: Internal Medicine

## 2017-07-23 ENCOUNTER — Ambulatory Visit: Payer: Medicare HMO | Admitting: Internal Medicine

## 2017-07-23 VITALS — BP 122/60 | HR 62 | Temp 98.3°F | Resp 20 | Ht 66.0 in | Wt 113.0 lb

## 2017-07-23 DIAGNOSIS — I10 Essential (primary) hypertension: Secondary | ICD-10-CM

## 2017-07-23 DIAGNOSIS — E119 Type 2 diabetes mellitus without complications: Secondary | ICD-10-CM

## 2017-07-23 DIAGNOSIS — C50911 Malignant neoplasm of unspecified site of right female breast: Secondary | ICD-10-CM | POA: Diagnosis not present

## 2017-07-23 DIAGNOSIS — N3281 Overactive bladder: Secondary | ICD-10-CM

## 2017-07-23 DIAGNOSIS — E063 Autoimmune thyroiditis: Secondary | ICD-10-CM

## 2017-07-23 DIAGNOSIS — K219 Gastro-esophageal reflux disease without esophagitis: Secondary | ICD-10-CM

## 2017-07-23 NOTE — Assessment & Plan Note (Signed)
Controlled, stable Continue current dose of medication-Vesicare daily

## 2017-07-23 NOTE — Assessment & Plan Note (Signed)
Management per endocrine 

## 2017-07-23 NOTE — Assessment & Plan Note (Signed)
GERD controlled Continue daily medication  

## 2017-07-23 NOTE — Assessment & Plan Note (Signed)
Management per Dr Endocrine  Sugars 30 - 103 Taking metformin 1/day

## 2017-07-23 NOTE — Assessment & Plan Note (Signed)
BP well controlled Current regimen effective and well tolerated Continue current medications at current doses  

## 2017-07-23 NOTE — Assessment & Plan Note (Signed)
Scheduled for right-sided lumpectomy 08/14/17

## 2017-07-24 ENCOUNTER — Ambulatory Visit: Payer: Self-pay | Admitting: Surgery

## 2017-07-24 DIAGNOSIS — D0511 Intraductal carcinoma in situ of right breast: Secondary | ICD-10-CM

## 2017-07-24 NOTE — Telephone Encounter (Signed)
Pradaxa 150mg  refill request received; pt is 76 yrs old, wt-51.3kg, Crea-0.91 on 01/08/17, last seen by Dr. Caryl Comes on 11/08/16 & recall for April 2019, CrCl-42.60ml/min; will send in requested refill to requested pharmacy.

## 2017-07-26 ENCOUNTER — Telehealth: Payer: Self-pay | Admitting: Internal Medicine

## 2017-07-26 ENCOUNTER — Other Ambulatory Visit (INDEPENDENT_AMBULATORY_CARE_PROVIDER_SITE_OTHER): Payer: Self-pay | Admitting: *Deleted

## 2017-07-26 NOTE — Telephone Encounter (Signed)
This was handled by PCP.

## 2017-07-26 NOTE — Telephone Encounter (Signed)
° °  [name] Pre-operative Risk Assessment    Request for surgical clearance:  1. What type of surgery is being performed?  Breast biopsy with needle localization    2. When is this surgery scheduled? 08/14/2017  3. Are there any medications that need to be held prior to surgery and how long? pradaxa 157m  4. Practice name and name of physician performing surgery?   [facility]  Dr.Todd Gerkin  5. What is your office phone and fax number? [phone] [phone] 6. Anesthesia type (None, local, MAC, general) ? General    [name]12/20/2018, 4:48 PM  _________________________________________________________________   (provider comments below)

## 2017-07-27 ENCOUNTER — Other Ambulatory Visit: Payer: Self-pay

## 2017-07-27 ENCOUNTER — Emergency Department (HOSPITAL_COMMUNITY): Payer: Medicare HMO

## 2017-07-27 ENCOUNTER — Encounter (HOSPITAL_COMMUNITY): Payer: Self-pay

## 2017-07-27 ENCOUNTER — Emergency Department (HOSPITAL_COMMUNITY)
Admission: EM | Admit: 2017-07-27 | Discharge: 2017-07-28 | Disposition: A | Discharge status: Home / Self Care | Payer: Medicare HMO | Attending: Emergency Medicine | Admitting: Emergency Medicine

## 2017-07-27 DIAGNOSIS — Y999 Unspecified external cause status: Secondary | ICD-10-CM | POA: Insufficient documentation

## 2017-07-27 DIAGNOSIS — S8001XA Contusion of right knee, initial encounter: Secondary | ICD-10-CM | POA: Insufficient documentation

## 2017-07-27 DIAGNOSIS — S022XXA Fracture of nasal bones, initial encounter for closed fracture: Secondary | ICD-10-CM | POA: Diagnosis not present

## 2017-07-27 DIAGNOSIS — S0181XA Laceration without foreign body of other part of head, initial encounter: Secondary | ICD-10-CM | POA: Diagnosis not present

## 2017-07-27 DIAGNOSIS — R9431 Abnormal electrocardiogram [ECG] [EKG]: Secondary | ICD-10-CM | POA: Diagnosis not present

## 2017-07-27 DIAGNOSIS — S8002XA Contusion of left knee, initial encounter: Secondary | ICD-10-CM | POA: Diagnosis not present

## 2017-07-27 DIAGNOSIS — S3991XA Unspecified injury of abdomen, initial encounter: Secondary | ICD-10-CM | POA: Insufficient documentation

## 2017-07-27 DIAGNOSIS — Z950 Presence of cardiac pacemaker: Secondary | ICD-10-CM | POA: Diagnosis not present

## 2017-07-27 DIAGNOSIS — S0232XA Fracture of orbital floor, left side, initial encounter for closed fracture: Secondary | ICD-10-CM | POA: Diagnosis not present

## 2017-07-27 DIAGNOSIS — S12300A Unspecified displaced fracture of fourth cervical vertebra, initial encounter for closed fracture: Secondary | ICD-10-CM | POA: Diagnosis not present

## 2017-07-27 DIAGNOSIS — J449 Chronic obstructive pulmonary disease, unspecified: Secondary | ICD-10-CM | POA: Insufficient documentation

## 2017-07-27 DIAGNOSIS — F17210 Nicotine dependence, cigarettes, uncomplicated: Secondary | ICD-10-CM | POA: Diagnosis not present

## 2017-07-27 DIAGNOSIS — Z91040 Latex allergy status: Secondary | ICD-10-CM | POA: Insufficient documentation

## 2017-07-27 DIAGNOSIS — Z23 Encounter for immunization: Secondary | ICD-10-CM | POA: Diagnosis not present

## 2017-07-27 DIAGNOSIS — E119 Type 2 diabetes mellitus without complications: Secondary | ICD-10-CM | POA: Insufficient documentation

## 2017-07-27 DIAGNOSIS — M25562 Pain in left knee: Secondary | ICD-10-CM

## 2017-07-27 DIAGNOSIS — I2510 Atherosclerotic heart disease of native coronary artery without angina pectoris: Secondary | ICD-10-CM | POA: Insufficient documentation

## 2017-07-27 DIAGNOSIS — S0230XA Fracture of orbital floor, unspecified side, initial encounter for closed fracture: Secondary | ICD-10-CM | POA: Diagnosis not present

## 2017-07-27 DIAGNOSIS — Z85828 Personal history of other malignant neoplasm of skin: Secondary | ICD-10-CM | POA: Diagnosis not present

## 2017-07-27 DIAGNOSIS — Y939 Activity, unspecified: Secondary | ICD-10-CM | POA: Diagnosis not present

## 2017-07-27 DIAGNOSIS — E039 Hypothyroidism, unspecified: Secondary | ICD-10-CM | POA: Diagnosis not present

## 2017-07-27 DIAGNOSIS — Z79899 Other long term (current) drug therapy: Secondary | ICD-10-CM | POA: Diagnosis not present

## 2017-07-27 DIAGNOSIS — I10 Essential (primary) hypertension: Secondary | ICD-10-CM | POA: Insufficient documentation

## 2017-07-27 DIAGNOSIS — M25561 Pain in right knee: Secondary | ICD-10-CM | POA: Diagnosis not present

## 2017-07-27 DIAGNOSIS — Y92410 Unspecified street and highway as the place of occurrence of the external cause: Secondary | ICD-10-CM | POA: Diagnosis not present

## 2017-07-27 DIAGNOSIS — S0219XA Other fracture of base of skull, initial encounter for closed fracture: Secondary | ICD-10-CM

## 2017-07-27 DIAGNOSIS — S0990XA Unspecified injury of head, initial encounter: Secondary | ICD-10-CM | POA: Diagnosis not present

## 2017-07-27 LAB — COMPREHENSIVE METABOLIC PANEL
ALBUMIN: 4 g/dL (ref 3.5–5.0)
ALK PHOS: 43 U/L (ref 38–126)
ALT: 48 U/L (ref 14–54)
ANION GAP: 9 (ref 5–15)
AST: 67 U/L — ABNORMAL HIGH (ref 15–41)
BILIRUBIN TOTAL: 0.6 mg/dL (ref 0.3–1.2)
BUN: 27 mg/dL — ABNORMAL HIGH (ref 6–20)
CALCIUM: 9.8 mg/dL (ref 8.9–10.3)
CO2: 24 mmol/L (ref 22–32)
Chloride: 103 mmol/L (ref 101–111)
Creatinine, Ser: 1.11 mg/dL — ABNORMAL HIGH (ref 0.44–1.00)
GFR calc non Af Amer: 47 mL/min — ABNORMAL LOW (ref >60)
GFR, EST AFRICAN AMERICAN: 54 mL/min — AB (ref >60)
GLUCOSE: 129 mg/dL — AB (ref 65–99)
POTASSIUM: 3.9 mmol/L (ref 3.5–5.1)
SODIUM: 136 mmol/L (ref 135–145)
TOTAL PROTEIN: 6.8 g/dL (ref 6.5–8.1)

## 2017-07-27 LAB — URINALYSIS, ROUTINE W REFLEX MICROSCOPIC
Bilirubin Urine: NEGATIVE
Glucose, UA: NEGATIVE mg/dL
Hgb urine dipstick: NEGATIVE
KETONES UR: 5 mg/dL — AB
LEUKOCYTES UA: NEGATIVE
NITRITE: NEGATIVE
PROTEIN: NEGATIVE mg/dL
Specific Gravity, Urine: 1.024 (ref 1.005–1.030)
pH: 6 (ref 5.0–8.0)

## 2017-07-27 LAB — PROTIME-INR
INR: 1.28
Prothrombin Time: 15.9 seconds — ABNORMAL HIGH (ref 11.4–15.2)

## 2017-07-27 LAB — CBC WITH DIFFERENTIAL/PLATELET
BASOS PCT: 1 %
Basophils Absolute: 0.1 10*3/uL (ref 0.0–0.1)
EOS ABS: 0.2 10*3/uL (ref 0.0–0.7)
Eosinophils Relative: 2 %
HCT: 41.2 % (ref 36.0–46.0)
HEMOGLOBIN: 13.6 g/dL (ref 12.0–15.0)
LYMPHS ABS: 2.2 10*3/uL (ref 0.7–4.0)
Lymphocytes Relative: 20 %
MCH: 31.8 pg (ref 26.0–34.0)
MCHC: 33 g/dL (ref 30.0–36.0)
MCV: 96.3 fL (ref 78.0–100.0)
MONO ABS: 0.7 10*3/uL (ref 0.1–1.0)
MONOS PCT: 6 %
Neutro Abs: 8 10*3/uL — ABNORMAL HIGH (ref 1.7–7.7)
Neutrophils Relative %: 71 %
Platelets: 173 10*3/uL (ref 150–400)
RBC: 4.28 MIL/uL (ref 3.87–5.11)
RDW: 12.5 % (ref 11.5–15.5)
WBC: 11 10*3/uL — ABNORMAL HIGH (ref 4.0–10.5)

## 2017-07-27 LAB — I-STAT CG4 LACTIC ACID, ED: LACTIC ACID, VENOUS: 1.38 mmol/L (ref 0.5–1.9)

## 2017-07-27 MED ORDER — ONDANSETRON HCL 4 MG/2ML IJ SOLN
4.0000 mg | Freq: Once | INTRAMUSCULAR | Status: AC
  Administered 2017-07-27: 4 mg via INTRAVENOUS
  Filled 2017-07-27: qty 2

## 2017-07-27 MED ORDER — FENTANYL CITRATE (PF) 100 MCG/2ML IJ SOLN
50.0000 ug | Freq: Once | INTRAMUSCULAR | Status: AC
  Administered 2017-07-27: 50 ug via INTRAVENOUS
  Filled 2017-07-27: qty 2

## 2017-07-27 MED ORDER — TETANUS-DIPHTH-ACELL PERTUSSIS 5-2.5-18.5 LF-MCG/0.5 IM SUSP
0.5000 mL | Freq: Once | INTRAMUSCULAR | Status: AC
  Administered 2017-07-27: 0.5 mL via INTRAMUSCULAR
  Filled 2017-07-27: qty 0.5

## 2017-07-27 MED ORDER — SODIUM CHLORIDE 0.9 % IV BOLUS (SEPSIS)
1000.0000 mL | Freq: Once | INTRAVENOUS | Status: AC
  Administered 2017-07-27: 1000 mL via INTRAVENOUS

## 2017-07-27 MED ORDER — LORAZEPAM 2 MG/ML IJ SOLN
0.5000 mg | Freq: Once | INTRAMUSCULAR | Status: AC
  Administered 2017-07-27: 0.5 mg via INTRAVENOUS
  Filled 2017-07-27: qty 1

## 2017-07-27 MED ORDER — HYDROCODONE-ACETAMINOPHEN 5-325 MG PO TABS: 0.5000 | ORAL_TABLET | Freq: Four times a day (QID) | ORAL | 0 refills | Status: DC | PRN

## 2017-07-27 MED ORDER — IOPAMIDOL (ISOVUE-300) INJECTION 61%
INTRAVENOUS | Status: AC
  Administered 2017-07-27: 100 mL
  Filled 2017-07-27: qty 100

## 2017-07-27 NOTE — Telephone Encounter (Signed)
To [facility]
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
## 2017-07-27 NOTE — Telephone Encounter (Signed)
Pt takes Pradaxa for afib with CHADS2VASc score of 6 (age x2, sex, HTN, CAD, DM). CrCl is 14mL/min. Recommend holding Pradaxa for 3 days prior to procedure due to reduced clearance.

## 2017-07-27 NOTE — Telephone Encounter (Signed)
   Primary Cardiologist:Dr. Caryl Comes Chart reviewed as part of pre-operative protocol coverage. Because of Tressie Ragin Fulford's past medical history and time since last visit, he/she will require a follow-up visit in order to better assess preoperative cardiovascular risk.  Pre-op covering staff: - Please schedule appointment and call patient to inform them. - Please contact requesting surgeon's office via preferred method (i.e, phone, fax) to inform them of need for appointment prior to surgery.  Cecilie Kicks, NP  07/27/2017, 4:42 PM

## 2017-07-28 DIAGNOSIS — G4733 Obstructive sleep apnea (adult) (pediatric): Secondary | ICD-10-CM | POA: Diagnosis not present

## 2017-07-28 DIAGNOSIS — C50911 Malignant neoplasm of unspecified site of right female breast: Secondary | ICD-10-CM | POA: Diagnosis not present

## 2017-07-29 ENCOUNTER — Other Ambulatory Visit: Payer: Self-pay

## 2017-07-29 ENCOUNTER — Emergency Department (HOSPITAL_COMMUNITY)
Admission: EM | Admit: 2017-07-29 | Discharge: 2017-07-29 | Disposition: A | Discharge status: Home / Self Care | Payer: Medicare HMO | Attending: Emergency Medicine | Admitting: Emergency Medicine

## 2017-07-29 ENCOUNTER — Encounter (HOSPITAL_COMMUNITY): Payer: Self-pay | Admitting: *Deleted

## 2017-07-29 ENCOUNTER — Emergency Department (HOSPITAL_COMMUNITY): Payer: Medicare HMO

## 2017-07-29 DIAGNOSIS — Z950 Presence of cardiac pacemaker: Secondary | ICD-10-CM | POA: Diagnosis not present

## 2017-07-29 DIAGNOSIS — Z961 Presence of intraocular lens: Secondary | ICD-10-CM | POA: Diagnosis not present

## 2017-07-29 DIAGNOSIS — F17210 Nicotine dependence, cigarettes, uncomplicated: Secondary | ICD-10-CM | POA: Insufficient documentation

## 2017-07-29 DIAGNOSIS — J449 Chronic obstructive pulmonary disease, unspecified: Secondary | ICD-10-CM | POA: Diagnosis not present

## 2017-07-29 DIAGNOSIS — E039 Hypothyroidism, unspecified: Secondary | ICD-10-CM | POA: Insufficient documentation

## 2017-07-29 DIAGNOSIS — Z91040 Latex allergy status: Secondary | ICD-10-CM | POA: Diagnosis not present

## 2017-07-29 DIAGNOSIS — E119 Type 2 diabetes mellitus without complications: Secondary | ICD-10-CM | POA: Diagnosis not present

## 2017-07-29 DIAGNOSIS — I10 Essential (primary) hypertension: Secondary | ICD-10-CM | POA: Insufficient documentation

## 2017-07-29 DIAGNOSIS — I4891 Unspecified atrial fibrillation: Secondary | ICD-10-CM | POA: Insufficient documentation

## 2017-07-29 DIAGNOSIS — M5412 Radiculopathy, cervical region: Secondary | ICD-10-CM | POA: Diagnosis not present

## 2017-07-29 DIAGNOSIS — Z7984 Long term (current) use of oral hypoglycemic drugs: Secondary | ICD-10-CM | POA: Insufficient documentation

## 2017-07-29 DIAGNOSIS — Z79899 Other long term (current) drug therapy: Secondary | ICD-10-CM | POA: Insufficient documentation

## 2017-07-29 DIAGNOSIS — M79601 Pain in right arm: Secondary | ICD-10-CM

## 2017-07-29 DIAGNOSIS — I2510 Atherosclerotic heart disease of native coronary artery without angina pectoris: Secondary | ICD-10-CM | POA: Diagnosis not present

## 2017-07-29 DIAGNOSIS — S0232XA Fracture of orbital floor, left side, initial encounter for closed fracture: Secondary | ICD-10-CM | POA: Diagnosis not present

## 2017-07-29 DIAGNOSIS — S0511XA Contusion of eyeball and orbital tissues, right eye, initial encounter: Secondary | ICD-10-CM | POA: Diagnosis not present

## 2017-07-29 LAB — I-STAT CHEM 8, ED
BUN: 16 mg/dL (ref 6–20)
CALCIUM ION: 1.38 mmol/L (ref 1.15–1.40)
Chloride: 102 mmol/L (ref 101–111)
Creatinine, Ser: 0.8 mg/dL (ref 0.44–1.00)
GLUCOSE: 150 mg/dL — AB (ref 65–99)
HCT: 33 % — ABNORMAL LOW (ref 36.0–46.0)
HEMOGLOBIN: 11.2 g/dL — AB (ref 12.0–15.0)
Potassium: 4.2 mmol/L (ref 3.5–5.1)
Sodium: 140 mmol/L (ref 135–145)
TCO2: 27 mmol/L (ref 22–32)

## 2017-07-29 LAB — I-STAT TROPONIN, ED: TROPONIN I, POC: 0 ng/mL (ref 0.00–0.08)

## 2017-07-29 MED ORDER — ENSURE COMPLETE PO LIQD: 237.0000 mL | Freq: Two times a day (BID) | ORAL | 2 refills | Status: DC
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
## 2017-08-01 NOTE — Telephone Encounter (Signed)
Scheduler called me to let me know that pt's daughter has cancelled Dr. Caryl Comes appt as well as lumpectomy for the pt. Pt will need to call back when she is ready to schedule her appt with Dr. Caryl Comes for pre-op clearance.
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
## 2017-08-01 NOTE — Telephone Encounter (Signed)
I s/w pt and her daughter Stacy [name] in regards to needing an appt to see Dr. Caryl Comes for surgery clearance for breast Bx. Pt states she has already had the breast Bx. After s/w the pt and her daughter I discovered the message from the operator was taken incorrectly and should not state surgery is Breast Bx however should say Breast Lumpectomy. Pt and her daughter are agreeable to plan of care and appt tomorrow with Dr. Caryl Comes 8:15. Both the pt and her daughter thanked me for my call today.

## 2017-08-02 ENCOUNTER — Ambulatory Visit: Payer: Medicare HMO | Admitting: Internal Medicine

## 2017-08-02 ENCOUNTER — Other Ambulatory Visit: Payer: Self-pay | Admitting: Family Medicine

## 2017-08-03 DIAGNOSIS — M25561 Pain in right knee: Secondary | ICD-10-CM | POA: Diagnosis not present

## 2017-08-03 DIAGNOSIS — S12391D Other nondisplaced fracture of fourth cervical vertebra, subsequent encounter for fracture with routine healing: Secondary | ICD-10-CM | POA: Diagnosis not present

## 2017-08-03 DIAGNOSIS — S02109D Fracture of base of skull, unspecified side, subsequent encounter for fracture with routine healing: Secondary | ICD-10-CM | POA: Diagnosis not present

## 2017-08-03 DIAGNOSIS — C50911 Malignant neoplasm of unspecified site of right female breast: Secondary | ICD-10-CM | POA: Diagnosis not present

## 2017-08-03 DIAGNOSIS — S022XXD Fracture of nasal bones, subsequent encounter for fracture with routine healing: Secondary | ICD-10-CM | POA: Diagnosis not present

## 2017-08-03 DIAGNOSIS — I2510 Atherosclerotic heart disease of native coronary artery without angina pectoris: Secondary | ICD-10-CM | POA: Diagnosis not present

## 2017-08-03 DIAGNOSIS — S0280XD Fracture of other specified skull and facial bones, unspecified side, subsequent encounter for fracture with routine healing: Secondary | ICD-10-CM | POA: Diagnosis not present

## 2017-08-03 DIAGNOSIS — E119 Type 2 diabetes mellitus without complications: Secondary | ICD-10-CM | POA: Diagnosis not present

## 2017-08-03 DIAGNOSIS — M25562 Pain in left knee: Secondary | ICD-10-CM | POA: Diagnosis not present

## 2017-08-06 DIAGNOSIS — S022XXD Fracture of nasal bones, subsequent encounter for fracture with routine healing: Secondary | ICD-10-CM | POA: Diagnosis not present

## 2017-08-06 DIAGNOSIS — M25561 Pain in right knee: Secondary | ICD-10-CM | POA: Diagnosis not present

## 2017-08-06 DIAGNOSIS — S0280XD Fracture of other specified skull and facial bones, unspecified side, subsequent encounter for fracture with routine healing: Secondary | ICD-10-CM | POA: Diagnosis not present

## 2017-08-06 DIAGNOSIS — I2510 Atherosclerotic heart disease of native coronary artery without angina pectoris: Secondary | ICD-10-CM | POA: Diagnosis not present

## 2017-08-06 DIAGNOSIS — C50911 Malignant neoplasm of unspecified site of right female breast: Secondary | ICD-10-CM | POA: Diagnosis not present

## 2017-08-06 DIAGNOSIS — S12391D Other nondisplaced fracture of fourth cervical vertebra, subsequent encounter for fracture with routine healing: Secondary | ICD-10-CM | POA: Diagnosis not present

## 2017-08-06 DIAGNOSIS — M25562 Pain in left knee: Secondary | ICD-10-CM | POA: Diagnosis not present

## 2017-08-06 DIAGNOSIS — S02109D Fracture of base of skull, unspecified side, subsequent encounter for fracture with routine healing: Secondary | ICD-10-CM | POA: Diagnosis not present

## 2017-08-06 DIAGNOSIS — E119 Type 2 diabetes mellitus without complications: Secondary | ICD-10-CM | POA: Diagnosis not present

## 2017-08-08 ENCOUNTER — Inpatient Hospital Stay (HOSPITAL_COMMUNITY): Admission: RE | Admit: 2017-08-08 | Payer: Medicare HMO | Source: Ambulatory Visit

## 2017-08-08 DIAGNOSIS — C50911 Malignant neoplasm of unspecified site of right female breast: Secondary | ICD-10-CM | POA: Diagnosis not present

## 2017-08-08 DIAGNOSIS — I2510 Atherosclerotic heart disease of native coronary artery without angina pectoris: Secondary | ICD-10-CM | POA: Diagnosis not present

## 2017-08-08 DIAGNOSIS — S12391D Other nondisplaced fracture of fourth cervical vertebra, subsequent encounter for fracture with routine healing: Secondary | ICD-10-CM | POA: Diagnosis not present

## 2017-08-08 DIAGNOSIS — M25562 Pain in left knee: Secondary | ICD-10-CM | POA: Diagnosis not present

## 2017-08-08 DIAGNOSIS — S0280XD Fracture of other specified skull and facial bones, unspecified side, subsequent encounter for fracture with routine healing: Secondary | ICD-10-CM | POA: Diagnosis not present

## 2017-08-08 DIAGNOSIS — M25561 Pain in right knee: Secondary | ICD-10-CM | POA: Diagnosis not present

## 2017-08-08 DIAGNOSIS — E119 Type 2 diabetes mellitus without complications: Secondary | ICD-10-CM | POA: Diagnosis not present

## 2017-08-08 DIAGNOSIS — S02109D Fracture of base of skull, unspecified side, subsequent encounter for fracture with routine healing: Secondary | ICD-10-CM | POA: Diagnosis not present

## 2017-08-08 DIAGNOSIS — S022XXD Fracture of nasal bones, subsequent encounter for fracture with routine healing: Secondary | ICD-10-CM | POA: Diagnosis not present

## 2017-08-09 ENCOUNTER — Ambulatory Visit (INDEPENDENT_AMBULATORY_CARE_PROVIDER_SITE_OTHER): Payer: Medicare HMO | Admitting: "Endocrinology

## 2017-08-09 ENCOUNTER — Ambulatory Visit (INDEPENDENT_AMBULATORY_CARE_PROVIDER_SITE_OTHER): Payer: Medicare HMO | Admitting: *Deleted

## 2017-08-09 DIAGNOSIS — S12390A Other displaced fracture of fourth cervical vertebra, initial encounter for closed fracture: Secondary | ICD-10-CM | POA: Diagnosis not present

## 2017-08-09 DIAGNOSIS — R030 Elevated blood-pressure reading, without diagnosis of hypertension: Secondary | ICD-10-CM | POA: Diagnosis not present

## 2017-08-09 DIAGNOSIS — M542 Cervicalgia: Secondary | ICD-10-CM | POA: Diagnosis not present

## 2017-08-09 DIAGNOSIS — I442 Atrioventricular block, complete: Secondary | ICD-10-CM

## 2017-08-09 DIAGNOSIS — Z681 Body mass index (BMI) 19 or less, adult: Secondary | ICD-10-CM | POA: Diagnosis not present

## 2017-08-09 NOTE — Progress Notes (Signed)
Remote pacemaker transmission.   

## 2017-08-10 ENCOUNTER — Encounter: Payer: Self-pay | Admitting: Cardiology

## 2017-08-10 ENCOUNTER — Telehealth: Payer: Self-pay | Admitting: Internal Medicine

## 2017-08-10 DIAGNOSIS — S0232XA Fracture of orbital floor, left side, initial encounter for closed fracture: Secondary | ICD-10-CM | POA: Diagnosis not present

## 2017-08-10 DIAGNOSIS — M795 Residual foreign body in soft tissue: Secondary | ICD-10-CM | POA: Diagnosis not present

## 2017-08-10 DIAGNOSIS — S022XXA Fracture of nasal bones, initial encounter for closed fracture: Secondary | ICD-10-CM | POA: Diagnosis not present

## 2017-08-10 NOTE — Telephone Encounter (Signed)
I spoke with pt's daughter and scheduled pt to see Dr Caryl Comes on January 9,2019 at 1:45
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
## 2017-08-13 DIAGNOSIS — I2510 Atherosclerotic heart disease of native coronary artery without angina pectoris: Secondary | ICD-10-CM | POA: Diagnosis not present

## 2017-08-13 DIAGNOSIS — S022XXD Fracture of nasal bones, subsequent encounter for fracture with routine healing: Secondary | ICD-10-CM | POA: Diagnosis not present

## 2017-08-13 DIAGNOSIS — S12391D Other nondisplaced fracture of fourth cervical vertebra, subsequent encounter for fracture with routine healing: Secondary | ICD-10-CM | POA: Diagnosis not present

## 2017-08-13 DIAGNOSIS — S02109D Fracture of base of skull, unspecified side, subsequent encounter for fracture with routine healing: Secondary | ICD-10-CM | POA: Diagnosis not present

## 2017-08-13 DIAGNOSIS — S0280XD Fracture of other specified skull and facial bones, unspecified side, subsequent encounter for fracture with routine healing: Secondary | ICD-10-CM | POA: Diagnosis not present

## 2017-08-13 DIAGNOSIS — E119 Type 2 diabetes mellitus without complications: Secondary | ICD-10-CM | POA: Diagnosis not present

## 2017-08-13 DIAGNOSIS — C50911 Malignant neoplasm of unspecified site of right female breast: Secondary | ICD-10-CM | POA: Diagnosis not present

## 2017-08-13 DIAGNOSIS — M25562 Pain in left knee: Secondary | ICD-10-CM | POA: Diagnosis not present

## 2017-08-13 DIAGNOSIS — M25561 Pain in right knee: Secondary | ICD-10-CM | POA: Diagnosis not present

## 2017-08-14 ENCOUNTER — Encounter (HOSPITAL_COMMUNITY): Admission: RE | Payer: Self-pay | Source: Ambulatory Visit

## 2017-08-14 ENCOUNTER — Ambulatory Visit (HOSPITAL_COMMUNITY): Admission: RE | Admit: 2017-08-14 | Payer: Medicare HMO | Source: Ambulatory Visit | Admitting: Surgery

## 2017-08-14 DIAGNOSIS — M25571 Pain in right ankle and joints of right foot: Secondary | ICD-10-CM | POA: Diagnosis not present

## 2017-08-14 SURGERY — BREAST BIOPSY WITH NEEDLE LOCALIZATION
Anesthesia: General | Site: Breast | Laterality: Right

## 2017-08-15 ENCOUNTER — Ambulatory Visit: Payer: Medicare HMO | Admitting: Internal Medicine

## 2017-08-15 ENCOUNTER — Encounter: Payer: Self-pay | Admitting: Internal Medicine

## 2017-08-15 ENCOUNTER — Telehealth: Payer: Self-pay | Admitting: Internal Medicine

## 2017-08-15 VITALS — BP 106/68 | HR 68 | Ht 66.0 in | Wt 108.8 lb

## 2017-08-15 DIAGNOSIS — I442 Atrioventricular block, complete: Secondary | ICD-10-CM | POA: Diagnosis not present

## 2017-08-15 DIAGNOSIS — I480 Paroxysmal atrial fibrillation: Secondary | ICD-10-CM

## 2017-08-15 DIAGNOSIS — C801 Malignant (primary) neoplasm, unspecified: Secondary | ICD-10-CM | POA: Diagnosis not present

## 2017-08-15 DIAGNOSIS — Z950 Presence of cardiac pacemaker: Secondary | ICD-10-CM | POA: Diagnosis not present

## 2017-08-15 NOTE — Patient Instructions (Addendum)
Follow-Up: Medication Instructions:    Your physician recommends that you continue on your current medications as directed. Please refer to the Current Medication list given to you today.  Labwork:  None ordered  Testing/Procedures:  None ordered  Follow-Up: Remote monitoring is used to monitor your Pacemaker from home. This monitoring reduces the number of office visits required to check your device to one time per year. It allows [place] to keep an eye on the functioning of your device to ensure it is working properly. You are scheduled for a device check from home on 11/08/2017. You may send your transmission at any time that day. If you have a wireless device, the transmission will be sent automatically. After your physician reviews your transmission, you will receive a postcard with your next transmission date.  Your physician wants you to follow-up in: 1 year with Dr. Caryl Comes. You will receive a reminder letter in the mail two months in advance. If you don't receive a letter, please call our office to schedule the follow-up appointment.   Thank you for choosing [facility]!!        If you need a refill on your cardiac medications before your next appointment, please call your pharmacy.
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
## 2017-08-15 NOTE — Telephone Encounter (Signed)
Noted  

## 2017-08-15 NOTE — Telephone Encounter (Signed)
Copied from [place] [id number]. Topic: General - Other >> Aug 15, 2017 11:49 AM Patrice Paradise wrote: Reason for CRM: Stacy [name] w/Home [facility] @336 [phone] call to let Dr. Quay Burow know the patient cancel her appt for today. The reason the patient cancel it because she had 2 other doctor appts today.

## 2017-08-16 ENCOUNTER — Other Ambulatory Visit: Payer: Self-pay | Admitting: Internal Medicine

## 2017-08-16 LAB — CUP PACEART REMOTE DEVICE CHECK
Battery Impedance: 158 Ohm
Brady Statistic AP VP Percent: 14 %
Brady Statistic AS VP Percent: 0 %
Brady Statistic AS VS Percent: 25 %
Implantable Lead Implant Date: 20010521
Implantable Lead Location: 753859
Implantable Lead Model: 4592
Implantable Lead Model: 6940
Implantable Pulse Generator Implant Date: 20161229
Lead Channel Impedance Value: 1188 Ohm
Lead Channel Impedance Value: 608 Ohm
Lead Channel Pacing Threshold Amplitude: 0.625 V
Lead Channel Pacing Threshold Amplitude: 1.25 V
MDC IDC LEAD IMPLANT DT: 20010521
MDC IDC LEAD LOCATION: 753860
MDC IDC MSMT BATTERY REMAINING LONGEVITY: 135 mo
MDC IDC MSMT BATTERY VOLTAGE: 2.8 V
MDC IDC MSMT LEADCHNL RA PACING THRESHOLD PULSEWIDTH: 0.4 ms
MDC IDC MSMT LEADCHNL RV PACING THRESHOLD PULSEWIDTH: 0.4 ms
MDC IDC SESS DTM: 20190103125552
MDC IDC SET LEADCHNL RA PACING AMPLITUDE: 2 V
MDC IDC SET LEADCHNL RV PACING AMPLITUDE: 2.5 V
MDC IDC SET LEADCHNL RV PACING PULSEWIDTH: 0.4 ms
MDC IDC SET LEADCHNL RV SENSING SENSITIVITY: 4 mV
MDC IDC STAT BRADY AP VS PERCENT: 60 %

## 2017-08-17 DIAGNOSIS — S022XXD Fracture of nasal bones, subsequent encounter for fracture with routine healing: Secondary | ICD-10-CM | POA: Diagnosis not present

## 2017-08-17 DIAGNOSIS — M25562 Pain in left knee: Secondary | ICD-10-CM | POA: Diagnosis not present

## 2017-08-17 DIAGNOSIS — M25561 Pain in right knee: Secondary | ICD-10-CM | POA: Diagnosis not present

## 2017-08-17 DIAGNOSIS — S02109D Fracture of base of skull, unspecified side, subsequent encounter for fracture with routine healing: Secondary | ICD-10-CM | POA: Diagnosis not present

## 2017-08-17 DIAGNOSIS — I2510 Atherosclerotic heart disease of native coronary artery without angina pectoris: Secondary | ICD-10-CM | POA: Diagnosis not present

## 2017-08-17 DIAGNOSIS — S12391D Other nondisplaced fracture of fourth cervical vertebra, subsequent encounter for fracture with routine healing: Secondary | ICD-10-CM | POA: Diagnosis not present

## 2017-08-17 DIAGNOSIS — E119 Type 2 diabetes mellitus without complications: Secondary | ICD-10-CM | POA: Diagnosis not present

## 2017-08-17 DIAGNOSIS — S0280XD Fracture of other specified skull and facial bones, unspecified side, subsequent encounter for fracture with routine healing: Secondary | ICD-10-CM | POA: Diagnosis not present

## 2017-08-17 DIAGNOSIS — C50911 Malignant neoplasm of unspecified site of right female breast: Secondary | ICD-10-CM | POA: Diagnosis not present

## 2017-08-17 LAB — CUP PACEART INCLINIC DEVICE CHECK
Battery Impedance: 134 Ohm
Battery Remaining Longevity: 141 mo
Battery Voltage: 2.79 V
Brady Statistic AS VS Percent: 26 %
Implantable Lead Implant Date: 20010521
Implantable Lead Location: 753859
Implantable Lead Model: 4592
Implantable Lead Model: 6940
Implantable Pulse Generator Implant Date: 20161229
Lead Channel Impedance Value: 619 Ohm
Lead Channel Pacing Threshold Pulse Width: 0.4 ms
Lead Channel Sensing Intrinsic Amplitude: 11.2 mV
Lead Channel Sensing Intrinsic Amplitude: 4 mV
Lead Channel Setting Pacing Amplitude: 2 V
Lead Channel Setting Pacing Amplitude: 2.5 V
Lead Channel Setting Pacing Pulse Width: 0.4 ms
MDC IDC LEAD IMPLANT DT: 20010521
MDC IDC LEAD LOCATION: 753860
MDC IDC MSMT LEADCHNL RA PACING THRESHOLD AMPLITUDE: 0.75 V
MDC IDC MSMT LEADCHNL RA PACING THRESHOLD PULSEWIDTH: 0.4 ms
MDC IDC MSMT LEADCHNL RV IMPEDANCE VALUE: 1128 Ohm
MDC IDC MSMT LEADCHNL RV PACING THRESHOLD AMPLITUDE: 1.25 V
MDC IDC SESS DTM: 20190109192516
MDC IDC SET LEADCHNL RV SENSING SENSITIVITY: 4 mV
MDC IDC STAT BRADY AP VP PERCENT: 14 %
MDC IDC STAT BRADY AP VS PERCENT: 59 %
MDC IDC STAT BRADY AS VP PERCENT: 0 %

## 2017-08-20 ENCOUNTER — Other Ambulatory Visit: Payer: Self-pay | Admitting: Internal Medicine

## 2017-08-20 DIAGNOSIS — S02109D Fracture of base of skull, unspecified side, subsequent encounter for fracture with routine healing: Secondary | ICD-10-CM | POA: Diagnosis not present

## 2017-08-20 DIAGNOSIS — S12391D Other nondisplaced fracture of fourth cervical vertebra, subsequent encounter for fracture with routine healing: Secondary | ICD-10-CM | POA: Diagnosis not present

## 2017-08-20 DIAGNOSIS — M25562 Pain in left knee: Secondary | ICD-10-CM | POA: Diagnosis not present

## 2017-08-20 DIAGNOSIS — S0280XD Fracture of other specified skull and facial bones, unspecified side, subsequent encounter for fracture with routine healing: Secondary | ICD-10-CM | POA: Diagnosis not present

## 2017-08-20 DIAGNOSIS — C50911 Malignant neoplasm of unspecified site of right female breast: Secondary | ICD-10-CM | POA: Diagnosis not present

## 2017-08-20 DIAGNOSIS — E119 Type 2 diabetes mellitus without complications: Secondary | ICD-10-CM | POA: Diagnosis not present

## 2017-08-20 DIAGNOSIS — I2510 Atherosclerotic heart disease of native coronary artery without angina pectoris: Secondary | ICD-10-CM | POA: Diagnosis not present

## 2017-08-20 DIAGNOSIS — M25561 Pain in right knee: Secondary | ICD-10-CM | POA: Diagnosis not present

## 2017-08-20 DIAGNOSIS — S022XXD Fracture of nasal bones, subsequent encounter for fracture with routine healing: Secondary | ICD-10-CM | POA: Diagnosis not present
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
## 2017-08-21 NOTE — Patient Instructions (Addendum)
   Medications reviewed and updated. No changes recommended at this time.   A prescription for ensure was given.

## 2017-08-22 ENCOUNTER — Ambulatory Visit (INDEPENDENT_AMBULATORY_CARE_PROVIDER_SITE_OTHER): Payer: Medicare HMO | Admitting: Internal Medicine

## 2017-08-22 ENCOUNTER — Encounter: Payer: Self-pay | Admitting: Internal Medicine

## 2017-08-22 VITALS — BP 130/86 | HR 61 | Temp 97.5°F | Resp 16 | Wt 107.0 lb

## 2017-08-22 DIAGNOSIS — S0232XA Fracture of orbital floor, left side, initial encounter for closed fracture: Secondary | ICD-10-CM

## 2017-08-22 DIAGNOSIS — R413 Other amnesia: Secondary | ICD-10-CM | POA: Diagnosis not present

## 2017-08-22 DIAGNOSIS — M795 Residual foreign body in soft tissue: Secondary | ICD-10-CM

## 2017-08-22 DIAGNOSIS — R634 Abnormal weight loss: Secondary | ICD-10-CM

## 2017-08-22 DIAGNOSIS — S022XXA Fracture of nasal bones, initial encounter for closed fracture: Secondary | ICD-10-CM

## 2017-08-22 DIAGNOSIS — R51 Headache: Secondary | ICD-10-CM | POA: Diagnosis not present

## 2017-08-22 DIAGNOSIS — R519 Headache, unspecified: Secondary | ICD-10-CM | POA: Insufficient documentation

## 2017-08-22 MED ORDER — ENSURE PO LIQD: 237.0000 mL | Freq: Two times a day (BID) | ORAL | 3 refills | Status: DC

## 2017-08-22 NOTE — Assessment & Plan Note (Signed)
No intervention was necessary

## 2017-08-22 NOTE — Assessment & Plan Note (Signed)
Healing without intervention She has seen plastic surgery
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
## 2017-08-22 NOTE — Assessment & Plan Note (Signed)
Related to decreased appetite from everything that is going on Drinking Ensure twice daily-prescription provided Monitor weight Encouraged her to eat her meals in addition to the Ensure
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
## 2017-08-23 DIAGNOSIS — E119 Type 2 diabetes mellitus without complications: Secondary | ICD-10-CM | POA: Diagnosis not present

## 2017-08-23 DIAGNOSIS — S02109D Fracture of base of skull, unspecified side, subsequent encounter for fracture with routine healing: Secondary | ICD-10-CM | POA: Diagnosis not present

## 2017-08-23 DIAGNOSIS — S12391D Other nondisplaced fracture of fourth cervical vertebra, subsequent encounter for fracture with routine healing: Secondary | ICD-10-CM | POA: Diagnosis not present

## 2017-08-23 DIAGNOSIS — S022XXD Fracture of nasal bones, subsequent encounter for fracture with routine healing: Secondary | ICD-10-CM | POA: Diagnosis not present

## 2017-08-23 DIAGNOSIS — I2510 Atherosclerotic heart disease of native coronary artery without angina pectoris: Secondary | ICD-10-CM | POA: Diagnosis not present

## 2017-08-23 DIAGNOSIS — M25561 Pain in right knee: Secondary | ICD-10-CM | POA: Diagnosis not present

## 2017-08-23 DIAGNOSIS — M25562 Pain in left knee: Secondary | ICD-10-CM | POA: Diagnosis not present

## 2017-08-23 DIAGNOSIS — C50911 Malignant neoplasm of unspecified site of right female breast: Secondary | ICD-10-CM | POA: Diagnosis not present

## 2017-08-23 DIAGNOSIS — S0280XD Fracture of other specified skull and facial bones, unspecified side, subsequent encounter for fracture with routine healing: Secondary | ICD-10-CM | POA: Diagnosis not present

## 2017-08-24 ENCOUNTER — Other Ambulatory Visit: Payer: Self-pay | Admitting: Surgery

## 2017-08-24 DIAGNOSIS — M795 Residual foreign body in soft tissue: Secondary | ICD-10-CM | POA: Diagnosis not present

## 2017-08-24 DIAGNOSIS — D0511 Intraductal carcinoma in situ of right breast: Secondary | ICD-10-CM

## 2017-08-24 DIAGNOSIS — Z1881 Retained glass fragments: Secondary | ICD-10-CM | POA: Diagnosis not present

## 2017-08-28 ENCOUNTER — Other Ambulatory Visit: Payer: Self-pay | Admitting: Emergency Medicine

## 2017-08-28 MED ORDER — PRODIGY TWIST TOP LANCETS 28G MISC: 1 refills | Status: DC

## 2017-08-29 DIAGNOSIS — F3181 Bipolar II disorder: Secondary | ICD-10-CM | POA: Diagnosis not present

## 2017-08-30 DIAGNOSIS — E119 Type 2 diabetes mellitus without complications: Secondary | ICD-10-CM | POA: Diagnosis not present

## 2017-08-30 DIAGNOSIS — I2510 Atherosclerotic heart disease of native coronary artery without angina pectoris: Secondary | ICD-10-CM | POA: Diagnosis not present

## 2017-08-30 DIAGNOSIS — M25562 Pain in left knee: Secondary | ICD-10-CM | POA: Diagnosis not present

## 2017-08-30 DIAGNOSIS — S12391D Other nondisplaced fracture of fourth cervical vertebra, subsequent encounter for fracture with routine healing: Secondary | ICD-10-CM | POA: Diagnosis not present

## 2017-08-30 DIAGNOSIS — S022XXD Fracture of nasal bones, subsequent encounter for fracture with routine healing: Secondary | ICD-10-CM | POA: Diagnosis not present

## 2017-08-30 DIAGNOSIS — C50911 Malignant neoplasm of unspecified site of right female breast: Secondary | ICD-10-CM | POA: Diagnosis not present

## 2017-08-30 DIAGNOSIS — S0280XD Fracture of other specified skull and facial bones, unspecified side, subsequent encounter for fracture with routine healing: Secondary | ICD-10-CM | POA: Diagnosis not present

## 2017-08-30 DIAGNOSIS — S02109D Fracture of base of skull, unspecified side, subsequent encounter for fracture with routine healing: Secondary | ICD-10-CM | POA: Diagnosis not present

## 2017-08-30 DIAGNOSIS — M25561 Pain in right knee: Secondary | ICD-10-CM | POA: Diagnosis not present

## 2017-08-31 DIAGNOSIS — M25561 Pain in right knee: Secondary | ICD-10-CM | POA: Diagnosis not present

## 2017-08-31 DIAGNOSIS — M25562 Pain in left knee: Secondary | ICD-10-CM | POA: Diagnosis not present

## 2017-08-31 DIAGNOSIS — C50911 Malignant neoplasm of unspecified site of right female breast: Secondary | ICD-10-CM | POA: Diagnosis not present

## 2017-08-31 DIAGNOSIS — I2510 Atherosclerotic heart disease of native coronary artery without angina pectoris: Secondary | ICD-10-CM | POA: Diagnosis not present

## 2017-08-31 DIAGNOSIS — S0280XD Fracture of other specified skull and facial bones, unspecified side, subsequent encounter for fracture with routine healing: Secondary | ICD-10-CM | POA: Diagnosis not present

## 2017-08-31 DIAGNOSIS — E119 Type 2 diabetes mellitus without complications: Secondary | ICD-10-CM | POA: Diagnosis not present

## 2017-08-31 DIAGNOSIS — S12391D Other nondisplaced fracture of fourth cervical vertebra, subsequent encounter for fracture with routine healing: Secondary | ICD-10-CM | POA: Diagnosis not present

## 2017-08-31 DIAGNOSIS — S022XXD Fracture of nasal bones, subsequent encounter for fracture with routine healing: Secondary | ICD-10-CM | POA: Diagnosis not present

## 2017-08-31 DIAGNOSIS — S02109D Fracture of base of skull, unspecified side, subsequent encounter for fracture with routine healing: Secondary | ICD-10-CM | POA: Diagnosis not present

## 2017-09-01 DIAGNOSIS — F3181 Bipolar II disorder: Secondary | ICD-10-CM | POA: Diagnosis not present

## 2017-09-03 DIAGNOSIS — S022XXD Fracture of nasal bones, subsequent encounter for fracture with routine healing: Secondary | ICD-10-CM | POA: Diagnosis not present

## 2017-09-03 DIAGNOSIS — S0280XD Fracture of other specified skull and facial bones, unspecified side, subsequent encounter for fracture with routine healing: Secondary | ICD-10-CM | POA: Diagnosis not present

## 2017-09-03 DIAGNOSIS — C50911 Malignant neoplasm of unspecified site of right female breast: Secondary | ICD-10-CM | POA: Diagnosis not present

## 2017-09-03 DIAGNOSIS — S02109D Fracture of base of skull, unspecified side, subsequent encounter for fracture with routine healing: Secondary | ICD-10-CM | POA: Diagnosis not present

## 2017-09-03 DIAGNOSIS — I2510 Atherosclerotic heart disease of native coronary artery without angina pectoris: Secondary | ICD-10-CM | POA: Diagnosis not present

## 2017-09-03 DIAGNOSIS — M25562 Pain in left knee: Secondary | ICD-10-CM | POA: Diagnosis not present

## 2017-09-03 DIAGNOSIS — S12391D Other nondisplaced fracture of fourth cervical vertebra, subsequent encounter for fracture with routine healing: Secondary | ICD-10-CM | POA: Diagnosis not present

## 2017-09-03 DIAGNOSIS — E119 Type 2 diabetes mellitus without complications: Secondary | ICD-10-CM | POA: Diagnosis not present

## 2017-09-03 DIAGNOSIS — M25561 Pain in right knee: Secondary | ICD-10-CM | POA: Diagnosis not present

## 2017-09-04 DIAGNOSIS — M1711 Unilateral primary osteoarthritis, right knee: Secondary | ICD-10-CM | POA: Diagnosis not present

## 2017-09-04 DIAGNOSIS — M25561 Pain in right knee: Secondary | ICD-10-CM | POA: Diagnosis not present

## 2017-09-05 DIAGNOSIS — S12391D Other nondisplaced fracture of fourth cervical vertebra, subsequent encounter for fracture with routine healing: Secondary | ICD-10-CM | POA: Diagnosis not present

## 2017-09-05 DIAGNOSIS — S0280XD Fracture of other specified skull and facial bones, unspecified side, subsequent encounter for fracture with routine healing: Secondary | ICD-10-CM | POA: Diagnosis not present

## 2017-09-05 DIAGNOSIS — I2510 Atherosclerotic heart disease of native coronary artery without angina pectoris: Secondary | ICD-10-CM | POA: Diagnosis not present

## 2017-09-05 DIAGNOSIS — M25561 Pain in right knee: Secondary | ICD-10-CM | POA: Diagnosis not present

## 2017-09-05 DIAGNOSIS — C50911 Malignant neoplasm of unspecified site of right female breast: Secondary | ICD-10-CM | POA: Diagnosis not present

## 2017-09-05 DIAGNOSIS — M25562 Pain in left knee: Secondary | ICD-10-CM | POA: Diagnosis not present

## 2017-09-05 DIAGNOSIS — S02109D Fracture of base of skull, unspecified side, subsequent encounter for fracture with routine healing: Secondary | ICD-10-CM | POA: Diagnosis not present

## 2017-09-05 DIAGNOSIS — E119 Type 2 diabetes mellitus without complications: Secondary | ICD-10-CM | POA: Diagnosis not present

## 2017-09-05 DIAGNOSIS — S022XXD Fracture of nasal bones, subsequent encounter for fracture with routine healing: Secondary | ICD-10-CM | POA: Diagnosis not present

## 2017-09-06 ENCOUNTER — Encounter (HOSPITAL_COMMUNITY): Payer: Self-pay

## 2017-09-06 ENCOUNTER — Encounter (HOSPITAL_COMMUNITY)
Admission: RE | Admit: 2017-09-06 | Discharge: 2017-09-06 | Disposition: A | Discharge status: Home / Self Care | Payer: Medicare HMO | Source: Ambulatory Visit | Attending: Surgery | Admitting: Surgery

## 2017-09-06 DIAGNOSIS — D0511 Intraductal carcinoma in situ of right breast: Secondary | ICD-10-CM | POA: Diagnosis not present

## 2017-09-06 DIAGNOSIS — Z01812 Encounter for preprocedural laboratory examination: Secondary | ICD-10-CM | POA: Insufficient documentation

## 2017-09-06 HISTORY — DX: Presence of cardiac pacemaker: Z95.0

## 2017-09-06 LAB — CBC
HEMATOCRIT: 41.7 % (ref 36.0–46.0)
Hemoglobin: 13.5 g/dL (ref 12.0–15.0)
MCH: 32.3 pg (ref 26.0–34.0)
MCHC: 32.4 g/dL (ref 30.0–36.0)
MCV: 99.8 fL (ref 78.0–100.0)
Platelets: 196 10*3/uL (ref 150–400)
RBC: 4.18 MIL/uL (ref 3.87–5.11)
RDW: 12.7 % (ref 11.5–15.5)
WBC: 9.5 10*3/uL (ref 4.0–10.5)

## 2017-09-06 LAB — BASIC METABOLIC PANEL
Anion gap: 9 (ref 5–15)
BUN: 16 mg/dL (ref 6–20)
CALCIUM: 10.1 mg/dL (ref 8.9–10.3)
CHLORIDE: 106 mmol/L (ref 101–111)
CO2: 25 mmol/L (ref 22–32)
CREATININE: 1.02 mg/dL — AB (ref 0.44–1.00)
GFR calc Af Amer: >60 mL/min (ref >60)
GFR calc non Af Amer: 52 mL/min — ABNORMAL LOW (ref >60)
GLUCOSE: 99 mg/dL (ref 65–99)
Potassium: 4 mmol/L (ref 3.5–5.1)
Sodium: 140 mmol/L (ref 135–145)

## 2017-09-06 LAB — HEMOGLOBIN A1C
Hgb A1c MFr Bld: 5.3 % (ref 4.8–5.6)
MEAN PLASMA GLUCOSE: 105.41 mg/dL

## 2017-09-06 LAB — GLUCOSE, CAPILLARY: Glucose-Capillary: 94 mg/dL (ref 65–99)

## 2017-09-06 MED ORDER — CHLORHEXIDINE GLUCONATE CLOTH 2 % EX PADS: 6.0000 | MEDICATED_PAD | Freq: Once | CUTANEOUS | Status: DC

## 2017-09-06 NOTE — Pre-Procedure Instructions (Signed)
    [name]  09/06/2017      [facility] - [place], [place] [address] Phone: [phone] Fax: [phone]  [name], [place] - [address] [place] [address] Phone: [phone] Fax: [phone]    Your procedure is scheduled on 09/10/17.  Report to [facility] Admitting at 9 A.M.  Call this number if you have problems the morning of surgery:  [phone]   Remember:  Do not eat food or drink liquids after midnight.  Take these medicines the morning of surgery with A SIP OF WATER --tylenol,prozac,synthroid,ultram,prilosec   Do not wear jewelry, make-up or nail polish.  Do not wear lotions, powders, or perfumes, or deodorant.  Do not shave 48 hours prior to surgery.  Men may shave face and neck.  Do not bring valuables to the hospital.  [facility] is not responsible for any belongings or valuables.  Contacts, dentures or bridgework may not be worn into surgery.  Leave your suitcase in the car.  After surgery it may be brought to your room.  For patients admitted to the hospital, discharge time will be determined by your treatment team.  Patients discharged the day of surgery will not be allowed to drive home.   Name and phone number of your driver:   Special instructions:  Do not take any aspirin,anti-inflammatories,vitamins,or herbal supplements 5-7 days prior to surgery.  Please read over the following fact sheets that you were given.

## 2017-09-07 DIAGNOSIS — K5790 Diverticulosis of intestine, part unspecified, without perforation or abscess without bleeding: Secondary | ICD-10-CM | POA: Diagnosis not present

## 2017-09-07 DIAGNOSIS — M25562 Pain in left knee: Secondary | ICD-10-CM | POA: Diagnosis not present

## 2017-09-07 DIAGNOSIS — I2510 Atherosclerotic heart disease of native coronary artery without angina pectoris: Secondary | ICD-10-CM | POA: Diagnosis not present

## 2017-09-07 DIAGNOSIS — F419 Anxiety disorder, unspecified: Secondary | ICD-10-CM

## 2017-09-07 DIAGNOSIS — Z950 Presence of cardiac pacemaker: Secondary | ICD-10-CM

## 2017-09-07 DIAGNOSIS — S12391D Other nondisplaced fracture of fourth cervical vertebra, subsequent encounter for fracture with routine healing: Secondary | ICD-10-CM | POA: Diagnosis not present

## 2017-09-07 DIAGNOSIS — C50911 Malignant neoplasm of unspecified site of right female breast: Secondary | ICD-10-CM | POA: Diagnosis not present

## 2017-09-07 DIAGNOSIS — J449 Chronic obstructive pulmonary disease, unspecified: Secondary | ICD-10-CM | POA: Diagnosis not present

## 2017-09-07 DIAGNOSIS — I482 Chronic atrial fibrillation: Secondary | ICD-10-CM

## 2017-09-07 DIAGNOSIS — F329 Major depressive disorder, single episode, unspecified: Secondary | ICD-10-CM

## 2017-09-07 DIAGNOSIS — S0280XD Fracture of other specified skull and facial bones, unspecified side, subsequent encounter for fracture with routine healing: Secondary | ICD-10-CM | POA: Diagnosis not present

## 2017-09-07 DIAGNOSIS — E119 Type 2 diabetes mellitus without complications: Secondary | ICD-10-CM | POA: Diagnosis not present

## 2017-09-07 DIAGNOSIS — M25561 Pain in right knee: Secondary | ICD-10-CM | POA: Diagnosis not present

## 2017-09-07 DIAGNOSIS — S022XXD Fracture of nasal bones, subsequent encounter for fracture with routine healing: Secondary | ICD-10-CM | POA: Diagnosis not present

## 2017-09-07 DIAGNOSIS — Z7984 Long term (current) use of oral hypoglycemic drugs: Secondary | ICD-10-CM

## 2017-09-07 DIAGNOSIS — Z955 Presence of coronary angioplasty implant and graft: Secondary | ICD-10-CM

## 2017-09-07 DIAGNOSIS — I10 Essential (primary) hypertension: Secondary | ICD-10-CM | POA: Diagnosis not present

## 2017-09-07 DIAGNOSIS — E785 Hyperlipidemia, unspecified: Secondary | ICD-10-CM

## 2017-09-07 DIAGNOSIS — Z7901 Long term (current) use of anticoagulants: Secondary | ICD-10-CM

## 2017-09-07 DIAGNOSIS — S02109D Fracture of base of skull, unspecified side, subsequent encounter for fracture with routine healing: Secondary | ICD-10-CM | POA: Diagnosis not present
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
## 2017-09-09 ENCOUNTER — Encounter (HOSPITAL_COMMUNITY): Payer: Self-pay | Admitting: Surgery

## 2017-09-09 DIAGNOSIS — D0511 Intraductal carcinoma in situ of right breast: Secondary | ICD-10-CM | POA: Diagnosis present

## 2017-09-09 NOTE — H&P (Signed)
General Surgery [facility]  Stacy [name] DOB: Aug 14, 1940 Widowed / Language: Stacy [name] / Race: White Female   History of Present Illness   The patient is a 77 year old female who presents with a breast mass.  CC: DCIS right breast  Patient is referred by Dr. Kristopher Oppenheim for newly diagnosed ductal carcinoma in situ of the right breast. Patient had a routine screening mammogram in November 2018. A 1.5 cm area of calcifications was noted in the lateral right breast. Additional imaging was performed followed by core needle biopsy. This showed ductal carcinoma in situ with necrosis. There was also a complex sclerosing lesion with calcifications. A marker was placed radiographically. Patient is now referred for surgical consultation. Patient did experience significant ecchymosis and hematoma formation following biopsy. She is anticoagulated with Pradaxa for atrial fibrillation. Patient has no prior history of breast disease. She denies any nipple discharge. She denies any breast masses. She has had no prior breast biopsies. There is no family history of breast disease or breast cancer. She is accompanied today by her daughter who is a Marine scientist.   Past Surgical History  Breast Biopsy  Right. Cataract Surgery  Bilateral. Hysterectomy (not due to cancer) - Partial  Oral Surgery  Thyroid Surgery   Diagnostic Studies History  Mammogram  within last year Pap Smear  >5 years ago  Medication History Aricept (10MG  Tablet, Oral) Active. Synthroid (112MCG Tablet, Oral) Active. BuSpar (15MG  Tablet, Oral) Active. Calcium-Vitamin D (250MG  Capsule, Oral) Active. FLUoxetine HCl (PMDD) (20MG  Capsule, Oral) Active. Lithium Carbonate (300MG  Capsule, Oral) Active. MetFORMIN HCl (500MG  Tablet, Oral) Active. VESIcare (5MG  Tablet, Oral) Active. Losartan Potassium (50MG  Tablet, Oral) Active. Pradaxa (150MG  Capsule, Oral) Active. Omeprazole (20MG  Capsule  DR, Oral) Active. Loratadine (10MG  Capsule, Oral) Active. Medications Reconciled  Social History Alcohol use  Remotely quit alcohol use. Caffeine use  Carbonated beverages, Coffee, Tea. No drug use  Tobacco use  Current every day smoker.  Family History Alcohol Abuse  Brother, Father. Arthritis  Brother. Depression  Father, Sister. Diabetes Mellitus  Sister. Heart Disease  Brother, Father, Mother, Sister. Heart disease in female family member before age 77  Heart disease in female family member before age 12  Hypertension  Father, Mother, Sister. Melanoma  Daughter. Migraine Headache  Daughter.  Pregnancy / Birth History  Age at menarche  64 years. Age of menopause  41-50 Gravida  3 Length (months) of breastfeeding  >29 Maternal age  32-25 Para  2 Regular periods   Other Problems  Anxiety Disorder  Arthritis  Atrial Fibrillation  Cerebrovascular Accident  Depression  Diabetes Mellitus  Diverticulosis  Heart murmur  Hemorrhoids  High blood pressure  Melanoma  Myocardial infarction  Sleep Apnea  Thyroid Cancer  Thyroid Disease    Review of Systems  General Present- Appetite Loss, Fatigue and Weight Loss. Not Present- Chills, Fever, Night Sweats and Weight Gain. Skin Present- Dryness. Not Present- Change in Wart/Mole, Hives, Jaundice, New Lesions, Non-Healing Wounds, Rash and Ulcer. HEENT Present- Wears glasses/contact lenses. Not Present- Earache, Hearing Loss, Hoarseness, Nose Bleed, Oral Ulcers, Ringing in the Ears, Seasonal Allergies, Sinus Pain, Sore Throat, Visual Disturbances and Yellow Eyes. Respiratory Not Present- Bloody sputum, Chronic Cough, Difficulty Breathing, Snoring and Wheezing. Breast Present- Breast Pain. Not Present- Breast Mass, Nipple Discharge and Skin Changes. Gastrointestinal Present- Hemorrhoids. Not Present- Abdominal Pain, Bloating, Bloody Stool, Change in Bowel Habits, Chronic diarrhea, Constipation,  Difficulty Swallowing, Excessive gas, Gets full quickly at meals, Indigestion, Nausea,  Rectal Pain and Vomiting. Female Genitourinary Not Present- Frequency, Nocturia, Painful Urination, Pelvic Pain and Urgency. Musculoskeletal Present- Joint Pain and Muscle Weakness. Not Present- Back Pain, Joint Stiffness, Muscle Pain and Swelling of Extremities. Neurological Present- Decreased Memory, Tremor and Weakness. Not Present- Fainting, Headaches, Numbness, Seizures, Tingling and Trouble walking. Psychiatric Present- Anxiety, Bipolar and Change in Sleep Pattern. Not Present- Depression, Fearful and Frequent crying. Endocrine Present- Cold Intolerance and Heat Intolerance. Not Present- Excessive Hunger, Hair Changes, Hot flashes and New Diabetes. Hematology Present- Blood Thinners and Easy Bruising. Not Present- Excessive bleeding, Gland problems, HIV and Persistent Infections.  Vitals  Height: 66in Pulse: 73 (Regular)  BP: 118/78 (Sitting, Left Arm, Standard)  Physical Exam  See vital signs recorded above  GENERAL APPEARANCE Development: normal Nutritional status: normal Gross deformities: none  SKIN Rash, lesions, ulcers: none Induration, erythema: none Nodules: none palpable  EYES Conjunctiva and lids: normal Pupils: equal and reactive Iris: normal bilaterally  EARS, NOSE, MOUTH, THROAT External ears: no lesion or deformity External nose: no lesion or deformity Hearing: grossly normal Lips: no lesion or deformity Dentition: normal for age Oral mucosa: moist  NECK Symmetric: yes Trachea: midline Thyroid: no palpable nodules in the thyroid bed; well-healed anterior cervical incision  CHEST Respiratory effort: normal Retraction or accessory muscle use: no Breath sounds: normal bilaterally Rales, rhonchi, wheeze: none  CARDIOVASCULAR Auscultation: regular rhythm, normal rate Murmurs: none Pulses: carotid and radial pulse 2+ palpable Lower extremity edema:  none Lower extremity varicosities: none  BREAST Right breast shows significant ecchymosis extending from the latissimus posteriorly to the sternum anteriorly. There is a moderate hematoma involving the lateral right breast and inferior portion of the breast which is mildly tender to palpation. There is a healing 2 mm surgical wound in the lateral breast. Nipple areolar complexes grossly normal. Axilla is free of adenopathy. Left breast shows a normal nipple areolar complex. Breast parenchyma is without discrete or dominant mass. Left axilla is free of adenopathy.  MUSCULOSKELETAL Station and gait: normal Digits and nails: no clubbing or cyanosis Muscle strength: grossly normal all extremities Range of motion: grossly normal all extremities Deformity: none  LYMPHATIC Cervical: none palpable Supraclavicular: none palpable  PSYCHIATRIC Oriented to person, place, and time: yes Mood and affect: normal for situation Judgment and insight: appropriate for situation    Assessment & Plan  DUCTAL CARCINOMA IN SITU (DCIS) OF RIGHT BREAST (D05.11)  Patient presents today accompanied by her daughter for evaluation of newly diagnosed ductal carcinoma in situ of the right breast.  Patient will require surgical excision of the involved area of the right breast. This will require wire localization and an outpatient surgical procedure. Final pathology will be reviewed to see if there is any invasive component. Following surgery and final results, we will make referral to medical oncology for further evaluation and recommendations for management.  The risks and benefits of the procedure have been discussed at length with the patient. The patient understands the proposed procedure, potential alternative treatments, and the course of recovery to be expected. All of the patient's questions have been answered at this time. The patient wishes to proceed with surgery.  Armandina Gemma, [place]  [facility]gery Office: [phone]

## 2017-09-10 ENCOUNTER — Ambulatory Visit (HOSPITAL_COMMUNITY): Payer: Medicare HMO | Admitting: Emergency Medicine

## 2017-09-10 ENCOUNTER — Ambulatory Visit (HOSPITAL_COMMUNITY): Payer: Medicare HMO | Admitting: Certified Registered Nurse Anesthetist

## 2017-09-10 ENCOUNTER — Ambulatory Visit
Admission: RE | Admit: 2017-09-10 | Discharge: 2017-09-10 | Disposition: A | Discharge status: Home / Self Care | Payer: Medicare HMO | Source: Ambulatory Visit | Attending: Surgery | Admitting: Surgery

## 2017-09-10 ENCOUNTER — Encounter (HOSPITAL_COMMUNITY): Payer: Self-pay | Admitting: *Deleted

## 2017-09-10 ENCOUNTER — Ambulatory Visit (HOSPITAL_COMMUNITY)
Admission: RE | Admit: 2017-09-10 | Discharge: 2017-09-10 | Disposition: A | Discharge status: Home / Self Care | Payer: Medicare HMO | Source: Ambulatory Visit | Attending: Surgery | Admitting: Surgery

## 2017-09-10 ENCOUNTER — Encounter (HOSPITAL_COMMUNITY)
Admission: RE | Disposition: A | Discharge status: Home / Self Care | Payer: Self-pay | Source: Ambulatory Visit | Attending: Surgery

## 2017-09-10 DIAGNOSIS — N61 Mastitis without abscess: Secondary | ICD-10-CM | POA: Diagnosis not present

## 2017-09-10 DIAGNOSIS — F419 Anxiety disorder, unspecified: Secondary | ICD-10-CM | POA: Diagnosis not present

## 2017-09-10 DIAGNOSIS — I10 Essential (primary) hypertension: Secondary | ICD-10-CM | POA: Insufficient documentation

## 2017-09-10 DIAGNOSIS — E119 Type 2 diabetes mellitus without complications: Secondary | ICD-10-CM | POA: Insufficient documentation

## 2017-09-10 DIAGNOSIS — D0511 Intraductal carcinoma in situ of right breast: Secondary | ICD-10-CM | POA: Diagnosis present

## 2017-09-10 DIAGNOSIS — Z7984 Long term (current) use of oral hypoglycemic drugs: Secondary | ICD-10-CM | POA: Insufficient documentation

## 2017-09-10 DIAGNOSIS — R011 Cardiac murmur, unspecified: Secondary | ICD-10-CM | POA: Insufficient documentation

## 2017-09-10 DIAGNOSIS — E785 Hyperlipidemia, unspecified: Secondary | ICD-10-CM | POA: Diagnosis not present

## 2017-09-10 DIAGNOSIS — Z85820 Personal history of malignant melanoma of skin: Secondary | ICD-10-CM | POA: Diagnosis not present

## 2017-09-10 DIAGNOSIS — Z91040 Latex allergy status: Secondary | ICD-10-CM | POA: Insufficient documentation

## 2017-09-10 DIAGNOSIS — N6021 Fibroadenosis of right breast: Secondary | ICD-10-CM | POA: Diagnosis not present

## 2017-09-10 DIAGNOSIS — I4891 Unspecified atrial fibrillation: Secondary | ICD-10-CM | POA: Insufficient documentation

## 2017-09-10 DIAGNOSIS — Z8249 Family history of ischemic heart disease and other diseases of the circulatory system: Secondary | ICD-10-CM | POA: Insufficient documentation

## 2017-09-10 DIAGNOSIS — M1990 Unspecified osteoarthritis, unspecified site: Secondary | ICD-10-CM | POA: Diagnosis not present

## 2017-09-10 DIAGNOSIS — F17200 Nicotine dependence, unspecified, uncomplicated: Secondary | ICD-10-CM | POA: Insufficient documentation

## 2017-09-10 DIAGNOSIS — N6011 Diffuse cystic mastopathy of right breast: Secondary | ICD-10-CM | POA: Diagnosis not present

## 2017-09-10 DIAGNOSIS — Z79899 Other long term (current) drug therapy: Secondary | ICD-10-CM | POA: Insufficient documentation

## 2017-09-10 DIAGNOSIS — I252 Old myocardial infarction: Secondary | ICD-10-CM | POA: Insufficient documentation

## 2017-09-10 DIAGNOSIS — R928 Other abnormal and inconclusive findings on diagnostic imaging of breast: Secondary | ICD-10-CM | POA: Diagnosis not present

## 2017-09-10 DIAGNOSIS — F329 Major depressive disorder, single episode, unspecified: Secondary | ICD-10-CM | POA: Insufficient documentation

## 2017-09-10 DIAGNOSIS — N641 Fat necrosis of breast: Secondary | ICD-10-CM | POA: Insufficient documentation

## 2017-09-10 DIAGNOSIS — Z880 Allergy status to penicillin: Secondary | ICD-10-CM | POA: Insufficient documentation

## 2017-09-10 DIAGNOSIS — K219 Gastro-esophageal reflux disease without esophagitis: Secondary | ICD-10-CM | POA: Diagnosis not present

## 2017-09-10 DIAGNOSIS — R921 Mammographic calcification found on diagnostic imaging of breast: Secondary | ICD-10-CM | POA: Diagnosis not present

## 2017-09-10 DIAGNOSIS — J449 Chronic obstructive pulmonary disease, unspecified: Secondary | ICD-10-CM | POA: Insufficient documentation

## 2017-09-10 DIAGNOSIS — Z85850 Personal history of malignant neoplasm of thyroid: Secondary | ICD-10-CM | POA: Insufficient documentation

## 2017-09-10 DIAGNOSIS — Z7901 Long term (current) use of anticoagulants: Secondary | ICD-10-CM | POA: Insufficient documentation

## 2017-09-10 DIAGNOSIS — Z8719 Personal history of other diseases of the digestive system: Secondary | ICD-10-CM | POA: Insufficient documentation

## 2017-09-10 DIAGNOSIS — Z8673 Personal history of transient ischemic attack (TIA), and cerebral infarction without residual deficits: Secondary | ICD-10-CM | POA: Diagnosis not present

## 2017-09-10 DIAGNOSIS — G4730 Sleep apnea, unspecified: Secondary | ICD-10-CM | POA: Diagnosis not present

## 2017-09-10 DIAGNOSIS — Z885 Allergy status to narcotic agent status: Secondary | ICD-10-CM | POA: Insufficient documentation

## 2017-09-10 DIAGNOSIS — Z888 Allergy status to other drugs, medicaments and biological substances status: Secondary | ICD-10-CM | POA: Insufficient documentation

## 2017-09-10 HISTORY — PX: BREAST BIOPSY: SHX20

## 2017-09-10 LAB — APTT: aPTT: 40 seconds — ABNORMAL HIGH (ref 24–36)

## 2017-09-10 LAB — GLUCOSE, CAPILLARY
Glucose-Capillary: 100 mg/dL — ABNORMAL HIGH (ref 65–99)
Glucose-Capillary: 130 mg/dL — ABNORMAL HIGH (ref 65–99)

## 2017-09-10 SURGERY — BREAST BIOPSY WITH NEEDLE LOCALIZATION
Anesthesia: General | Site: Breast | Laterality: Right

## 2017-09-10 MED ORDER — FENTANYL CITRATE (PF) 100 MCG/2ML IJ SOLN
INTRAMUSCULAR | Status: DC | PRN
  Administered 2017-09-10: 50 ug via INTRAVENOUS

## 2017-09-10 MED ORDER — LIDOCAINE 2% (20 MG/ML) 5 ML SYRINGE
INTRAMUSCULAR | Status: DC | PRN
  Administered 2017-09-10: 100 mg via INTRAVENOUS

## 2017-09-10 MED ORDER — BUPIVACAINE HCL (PF) 0.25 % IJ SOLN
INTRAMUSCULAR | Status: AC
  Filled 2017-09-10: qty 30

## 2017-09-10 MED ORDER — DEXAMETHASONE SODIUM PHOSPHATE 10 MG/ML IJ SOLN
INTRAMUSCULAR | Status: DC | PRN
  Administered 2017-09-10: 10 mg via INTRAVENOUS

## 2017-09-10 MED ORDER — TRAMADOL HCL 50 MG PO TABS: 50.0000 mg | ORAL_TABLET | Freq: Four times a day (QID) | ORAL | 0 refills | Status: DC | PRN

## 2017-09-10 MED ORDER — 0.9 % SODIUM CHLORIDE (POUR BTL) OPTIME
TOPICAL | Status: DC | PRN
  Administered 2017-09-10: 1000 mL

## 2017-09-10 MED ORDER — LIDOCAINE 2% (20 MG/ML) 5 ML SYRINGE
INTRAMUSCULAR | Status: AC
  Filled 2017-09-10: qty 5

## 2017-09-10 MED ORDER — DEXAMETHASONE SODIUM PHOSPHATE 10 MG/ML IJ SOLN
INTRAMUSCULAR | Status: AC
Start: 2017-09-10 — End: ?
  Filled 2017-09-10: qty 1

## 2017-09-10 MED ORDER — BUPIVACAINE HCL (PF) 0.25 % IJ SOLN
INTRAMUSCULAR | Status: DC | PRN
  Administered 2017-09-10: 20 mL

## 2017-09-10 MED ORDER — CIPROFLOXACIN IN D5W 400 MG/200ML IV SOLN
400.0000 mg | INTRAVENOUS | Status: AC
  Administered 2017-09-10: 400 mg via INTRAVENOUS
  Filled 2017-09-10: qty 200

## 2017-09-10 MED ORDER — PHENYLEPHRINE HCL 10 MG/ML IJ SOLN
INTRAMUSCULAR | Status: DC | PRN
  Administered 2017-09-10: 200 ug via INTRAVENOUS

## 2017-09-10 MED ORDER — LACTATED RINGERS IV SOLN
INTRAVENOUS | Status: DC
  Administered 2017-09-10: 10:00:00 via INTRAVENOUS

## 2017-09-10 MED ORDER — ONDANSETRON HCL 4 MG/2ML IJ SOLN
INTRAMUSCULAR | Status: DC | PRN
  Administered 2017-09-10: 4 mg via INTRAVENOUS

## 2017-09-10 MED ORDER — MIDAZOLAM HCL 5 MG/5ML IJ SOLN
INTRAMUSCULAR | Status: DC | PRN
  Administered 2017-09-10: 2 mg via INTRAVENOUS

## 2017-09-10 MED ORDER — MIDAZOLAM HCL 2 MG/2ML IJ SOLN
INTRAMUSCULAR | Status: AC
  Filled 2017-09-10: qty 2

## 2017-09-10 MED ORDER — PROPOFOL 10 MG/ML IV BOLUS
INTRAVENOUS | Status: AC
  Filled 2017-09-10: qty 20

## 2017-09-10 MED ORDER — PHENYLEPHRINE 40 MCG/ML (10ML) SYRINGE FOR IV PUSH (FOR BLOOD PRESSURE SUPPORT)
PREFILLED_SYRINGE | INTRAVENOUS | Status: AC
  Filled 2017-09-10: qty 10

## 2017-09-10 MED ORDER — CHLORHEXIDINE GLUCONATE CLOTH 2 % EX PADS: 6.0000 | MEDICATED_PAD | Freq: Once | CUTANEOUS | Status: DC

## 2017-09-10 MED ORDER — FENTANYL CITRATE (PF) 250 MCG/5ML IJ SOLN
INTRAMUSCULAR | Status: AC
Start: 2017-09-10 — End: ?
  Filled 2017-09-10: qty 5

## 2017-09-10 MED ORDER — ONDANSETRON HCL 4 MG/2ML IJ SOLN
INTRAMUSCULAR | Status: AC
  Filled 2017-09-10: qty 2

## 2017-09-10 MED ORDER — PROPOFOL 10 MG/ML IV BOLUS
INTRAVENOUS | Status: DC | PRN
  Administered 2017-09-10: 150 mg via INTRAVENOUS

## 2017-09-10 SURGICAL SUPPLY — 38 items
APPLIER CLIP 9.375 SM OPEN (CLIP) ×2
BINDER BREAST LRG (GAUZE/BANDAGES/DRESSINGS) IMPLANT
BINDER BREAST XLRG (GAUZE/BANDAGES/DRESSINGS) IMPLANT
CHLORAPREP W/TINT 26ML (MISCELLANEOUS) ×2 IMPLANT
CLIP APPLIE 9.375 SM OPEN (CLIP) ×1 IMPLANT
CONT SPEC 4OZ CLIKSEAL STRL BL (MISCELLANEOUS) IMPLANT
COVER SURGICAL LIGHT HANDLE (MISCELLANEOUS) ×2 IMPLANT
DEVICE DUBIN SPECIMEN MAMMOGRA (MISCELLANEOUS) IMPLANT
DRAPE LAPAROSCOPIC ABDOMINAL (DRAPES) ×2 IMPLANT
DRAPE UTILITY XL STRL (DRAPES) ×4 IMPLANT
ELECT CAUTERY BLADE 6.4 (BLADE) ×2 IMPLANT
ELECT REM PT RETURN 9FT ADLT (ELECTROSURGICAL) ×2
ELECTRODE REM PT RTRN 9FT ADLT (ELECTROSURGICAL) ×1 IMPLANT
GAUZE SPONGE 4X4 12PLY STRL (GAUZE/BANDAGES/DRESSINGS) ×2 IMPLANT
GAUZE SPONGE 4X4 16PLY XRAY LF (GAUZE/BANDAGES/DRESSINGS) ×2 IMPLANT
GLOVE SURG ORTHO 8.0 STRL STRW (GLOVE) ×2 IMPLANT
GOWN EXTRA PROTECTION XL (GOWNS) ×2 IMPLANT
GOWN STRL REUS W/ TWL LRG LVL3 (GOWN DISPOSABLE) ×1 IMPLANT
GOWN STRL REUS W/ TWL XL LVL3 (GOWN DISPOSABLE) ×1 IMPLANT
GOWN STRL REUS W/TWL LRG LVL3 (GOWN DISPOSABLE) ×1
GOWN STRL REUS W/TWL XL LVL3 (GOWN DISPOSABLE) ×1
KIT BASIN OR (CUSTOM PROCEDURE TRAY) ×2 IMPLANT
KIT ROOM TURNOVER OR (KITS) ×2 IMPLANT
MARGIN MAP 10MM (MISCELLANEOUS) IMPLANT
NEEDLE HYPO 25GX1X1/2 BEV (NEEDLE) IMPLANT
NS IRRIG 1000ML POUR BTL (IV SOLUTION) ×2 IMPLANT
PACK SURGICAL SETUP 50X90 (CUSTOM PROCEDURE TRAY) ×2 IMPLANT
PAD ARMBOARD 7.5X6 YLW CONV (MISCELLANEOUS) ×4 IMPLANT
PENCIL BUTTON HOLSTER BLD 10FT (ELECTRODE) ×2 IMPLANT
STRIP CLOSURE SKIN 1/2X4 (GAUZE/BANDAGES/DRESSINGS) ×2 IMPLANT
SUT MON AB 3-0 SH 27 (SUTURE) ×1
SUT MON AB 3-0 SH27 (SUTURE) ×1 IMPLANT
SUT MON AB 4-0 PC3 18 (SUTURE) ×2 IMPLANT
SYR CONTROL 10ML LL (SYRINGE) IMPLANT
TOWEL OR 17X24 6PK STRL BLUE (TOWEL DISPOSABLE) ×2 IMPLANT
TOWEL OR 17X26 10 PK STRL BLUE (TOWEL DISPOSABLE) ×2 IMPLANT
TUBING BULK SUCTION (MISCELLANEOUS) ×2 IMPLANT
YANKAUER SUCT BULB TIP NO VENT (SUCTIONS) ×2 IMPLANT

## 2017-09-10 NOTE — Anesthesia Procedure Notes (Signed)
Procedure Name: LMA Insertion Date/Time: 09/10/2017 11:19 AM Performed by: Raenette Rover, CRNA Pre-anesthesia Checklist: Patient identified, Emergency Drugs available, Suction available and Patient being monitored Patient Re-evaluated:Patient Re-evaluated prior to induction Oxygen Delivery Method: Circle system utilized Preoxygenation: Pre-oxygenation with 100% oxygen Induction Type: IV induction LMA: LMA inserted LMA Size: 4.0 Number of attempts: 2 Placement Confirmation: positive ETCO2,  CO2 detector and breath sounds checked- equal and bilateral Tube secured with: Tape Dental Injury: Teeth and Oropharynx as per pre-operative assessment

## 2017-09-10 NOTE — Interval H&P Note (Signed)
History and Physical Interval Note:  09/10/2017 10:46 AM  Stacy [name]  has presented today for surgery, with the diagnosis of DCIS RIGHT BREAST.  The various methods of treatment have been discussed with the patient and family. After consideration of risks, benefits and other options for treatment, the patient has consented to    Procedure(s): BREAST BIOPSY WITH NEEDLE LOCALIZATION (Right) as a surgical intervention .    The patient's history has been reviewed, patient examined, no change in status, stable for surgery.  I have reviewed the patient's chart and labs.  Questions were answered to the patient's satisfaction.    Armandina Gemma, [place] [facility] Office: [place]

## 2017-09-10 NOTE — Brief Op Note (Signed)
09/10/2017  11:59 AM  PATIENT:  Stacy [name]  77 y.o. female  PRE-OPERATIVE DIAGNOSIS:  DCIS RIGHT BREAST  POST-OPERATIVE DIAGNOSIS:  DCIS RIGHT BREAST  PROCEDURE:  Procedure(s): BREAST BIOPSY WITH NEEDLE LOCALIZATION (Right)  SURGEON:  Surgeon(s) and Role:    Armandina Gemma, MD - Primary  ANESTHESIA:   general  EBL:  15 mL   BLOOD ADMINISTERED:none  DRAINS: none   LOCAL MEDICATIONS USED:  MARCAINE     SPECIMEN:  Excision  DISPOSITION OF SPECIMEN:  PATHOLOGY  COUNTS:  YES  TOURNIQUET:  * No tourniquets in log *  DICTATION: .Other Dictation: Dictation Number [id number]  PLAN OF CARE: Discharge to home after PACU  PATIENT DISPOSITION:  PACU - hemodynamically stable.   Delay start of Pharmacological VTE agent (>24hrs) due to surgical blood loss or risk of bleeding: yes  Armandina Gemma, MD [facility] Surgery Office: [phone]

## 2017-09-10 NOTE — Transfer of Care (Signed)
Immediate Anesthesia Transfer of Care Note  Patient: Stacy [name]  Procedure(s) Performed: BREAST BIOPSY WITH NEEDLE LOCALIZATION (Right Breast)  Patient Location: PACU  Anesthesia Type:General  Level of Consciousness: awake, alert , oriented and patient cooperative  Airway & Oxygen Therapy: Patient Spontanous Breathing and Patient connected to nasal cannula oxygen  Post-op Assessment: Report given to RN and Post -op Vital signs reviewed and stable  Post vital signs: Reviewed and stable  Last Vitals:  Vitals:   09/10/17 0846 09/10/17 0847  BP: (!) 146/55   Pulse: (!) 59   Resp: 18   Temp:  (!) 36.4 C  SpO2: 97%     Last Pain:  Vitals:   09/10/17 0929  PainSc: 4       Patients Stated Pain Goal: 2 (75/[id number]/32 [id number])  Complications: No apparent anesthesia complications

## 2017-09-10 NOTE — Op Note (Signed)
NAME:  Stacy [name], Stacy [name]                  ACCOUNT NO.:  MEDICAL RECORD NO.:  [id number]  LOCATION:                                 FACILITY:  PHYSICIAN:  Earnstine Regal, MD           DATE OF BIRTH:  DATE OF PROCEDURE:  09/10/2017                              OPERATIVE REPORT   PREOPERATIVE DIAGNOSIS:  Ductal carcinoma in situ, right breast.  POSTOPERATIVE DIAGNOSIS:  Ductal carcinoma in situ, right breast.  PROCEDURE:  Right breast excisional biopsy with wire localization.  SURGEON:  Armandina Gemma, MD.  ANESTHESIA:  General.  ESTIMATED BLOOD LOSS:  Minimal.  PREPARATION:  ChloraPrep.  COMPLICATIONS:  None.  INDICATIONS:  The patient is a 77 year old female, who was found on routine screening mammogram in November 2018 to have a 1.5 cm area of calcifications in the lateral right breast.  Additional imaging included core needle biopsy, which showed ductal carcinoma in situ with necrosis. There was also a complex sclerosing lesion with calcifications. Radiographic marker was placed.  The patient now comes to surgery for excisional biopsy with wire localization for definitive diagnosis.  BODY OF REPORT:  Procedure was done in OR #9 at the [facility].  The patient was brought to the operating room, placed in supine position on the operating room table.  Following administration of general anesthesia, the patient was positioned and then prepped and draped in the usual aseptic fashion.  After ascertaining that an adequate level of anesthesia had been achieved, an incision was made at the site of guidewire insertion.  Dissection was carried in the subcutaneous tissues and hemostasis achieved with the electrocautery.  Using the electrocautery for hemostasis, a core of breast tissue was excised around the tip of the guidewire.  Hemostasis was achieved with 3-0 Vicryl suture ligatures and use of the electrocautery.  The entire specimen was excised and a  specimen mammogram was performed.  This shows that the clip is centered within the excised specimen adjacent to the guidewire.  The entire specimen was submitted to Pathology for review.  Wound was inspected and good hemostasis achieved with the electrocautery.  Local anesthesia was infiltrated around the wound and under the skin edges.  The margins of dissection were marked with small Ligaclips.  Subcutaneous tissues were closed with interrupted 3-0 Vicryl sutures.  Skin was closed with a running 4-0 Monocryl subcuticular suture.  Wound was washed and dried and Steri-Strips were applied.  Sterile dressings were applied.  A breast binder was applied. The patient was awakened from anesthesia and brought to the recovery room.  The patient tolerated the procedure well.   Armandina Gemma, [place] Surgery Office: [phone]    TMG/MEDQ  D:  09/10/2017  T:  09/10/2017  Job:  [id number]  cc:   Earnstine Regal, MD Kristopher Oppenheim, MD

## 2017-09-10 NOTE — Anesthesia Preprocedure Evaluation (Signed)
Anesthesia Evaluation  Patient identified by MRN, date of birth, ID band Patient awake    Airway Mallampati: II  TM Distance: >3 FB Neck ROM: Limited  Mouth opening: Limited Mouth Opening  Dental  (+) Teeth Intact, Dental Advisory Given   Pulmonary sleep apnea , COPD, Current Smoker,    breath sounds clear to auscultation       Cardiovascular hypertension,  Rhythm:Regular Rate:Normal     Neuro/Psych    GI/Hepatic   Endo/Other  diabetes, Well Controlled, Type 2, Oral Hypoglycemic Agents  Renal/GU      Musculoskeletal   Abdominal   Peds  Hematology   Anesthesia Other Findings   Reproductive/Obstetrics                             Anesthesia Physical Anesthesia Plan  ASA: III  Anesthesia Plan: General   Post-op Pain Management:    Induction: Intravenous  PONV Risk Score and Plan: 2 and Ondansetron, Dexamethasone and Treatment may vary due to age or medical condition  Airway Management Planned: LMA  Additional Equipment:   Intra-op Plan:   Post-operative Plan: Extubation in OR  Informed Consent: I have reviewed the patients History and Physical, chart, labs and discussed the procedure including the risks, benefits and alternatives for the proposed anesthesia with the patient or authorized representative who has indicated his/her understanding and acceptance.   Dental advisory given  Plan Discussed with: CRNA, Anesthesiologist and Surgeon  Anesthesia Plan Comments:         Anesthesia Quick Evaluation

## 2017-09-10 NOTE — Anesthesia Postprocedure Evaluation (Signed)
Anesthesia Post Note  Patient: Stacy [name]  Procedure(s) Performed: BREAST BIOPSY WITH NEEDLE LOCALIZATION (Right Breast)     Patient location during evaluation: PACU Anesthesia Type: General Level of consciousness: awake and alert Pain management: pain level controlled Vital Signs Assessment: post-procedure vital signs reviewed and stable Respiratory status: spontaneous breathing, nonlabored ventilation, respiratory function stable and patient connected to nasal cannula oxygen Cardiovascular status: blood pressure returned to baseline and stable Postop Assessment: no apparent nausea or vomiting Anesthetic complications: no    Last Vitals:  Vitals:   09/10/17 1248 09/10/17 1249  BP:  (!) 148/66  Pulse: (!) 58 (!) 59  Resp:  18  Temp:    SpO2: 97% 99%    Last Pain:  Vitals:   09/10/17 0929  PainSc: 4                  Joanann Mies DAVID

## 2017-09-11 ENCOUNTER — Encounter (HOSPITAL_COMMUNITY): Payer: Self-pay | Admitting: Surgery

## 2017-09-12 DIAGNOSIS — S0280XD Fracture of other specified skull and facial bones, unspecified side, subsequent encounter for fracture with routine healing: Secondary | ICD-10-CM | POA: Diagnosis not present

## 2017-09-12 DIAGNOSIS — S022XXD Fracture of nasal bones, subsequent encounter for fracture with routine healing: Secondary | ICD-10-CM | POA: Diagnosis not present

## 2017-09-12 DIAGNOSIS — S02109D Fracture of base of skull, unspecified side, subsequent encounter for fracture with routine healing: Secondary | ICD-10-CM | POA: Diagnosis not present

## 2017-09-12 DIAGNOSIS — M25562 Pain in left knee: Secondary | ICD-10-CM | POA: Diagnosis not present

## 2017-09-12 DIAGNOSIS — I2510 Atherosclerotic heart disease of native coronary artery without angina pectoris: Secondary | ICD-10-CM | POA: Diagnosis not present

## 2017-09-12 DIAGNOSIS — S12391D Other nondisplaced fracture of fourth cervical vertebra, subsequent encounter for fracture with routine healing: Secondary | ICD-10-CM | POA: Diagnosis not present

## 2017-09-12 DIAGNOSIS — E119 Type 2 diabetes mellitus without complications: Secondary | ICD-10-CM | POA: Diagnosis not present

## 2017-09-12 DIAGNOSIS — M25561 Pain in right knee: Secondary | ICD-10-CM | POA: Diagnosis not present

## 2017-09-12 DIAGNOSIS — C50911 Malignant neoplasm of unspecified site of right female breast: Secondary | ICD-10-CM | POA: Diagnosis not present

## 2017-09-17 ENCOUNTER — Telehealth: Payer: Self-pay | Admitting: Oncology

## 2017-09-17 NOTE — Telephone Encounter (Signed)
Appt has been scheduled for the pt to see Dr. Jana Hakim on 2/27 at 4pm. Appt was givent to the pt's daughter [name]. Aware to arrive 30 minutes early.

## 2017-09-19 DIAGNOSIS — C50911 Malignant neoplasm of unspecified site of right female breast: Secondary | ICD-10-CM | POA: Diagnosis not present

## 2017-09-19 DIAGNOSIS — I2510 Atherosclerotic heart disease of native coronary artery without angina pectoris: Secondary | ICD-10-CM | POA: Diagnosis not present

## 2017-09-19 DIAGNOSIS — S02109D Fracture of base of skull, unspecified side, subsequent encounter for fracture with routine healing: Secondary | ICD-10-CM | POA: Diagnosis not present

## 2017-09-19 DIAGNOSIS — M25561 Pain in right knee: Secondary | ICD-10-CM | POA: Diagnosis not present

## 2017-09-19 DIAGNOSIS — M25562 Pain in left knee: Secondary | ICD-10-CM | POA: Diagnosis not present

## 2017-09-19 DIAGNOSIS — S12391D Other nondisplaced fracture of fourth cervical vertebra, subsequent encounter for fracture with routine healing: Secondary | ICD-10-CM | POA: Diagnosis not present

## 2017-09-19 DIAGNOSIS — E119 Type 2 diabetes mellitus without complications: Secondary | ICD-10-CM | POA: Diagnosis not present

## 2017-09-19 DIAGNOSIS — S022XXD Fracture of nasal bones, subsequent encounter for fracture with routine healing: Secondary | ICD-10-CM | POA: Diagnosis not present

## 2017-09-19 DIAGNOSIS — S0280XD Fracture of other specified skull and facial bones, unspecified side, subsequent encounter for fracture with routine healing: Secondary | ICD-10-CM | POA: Diagnosis not present

## 2017-09-20 ENCOUNTER — Encounter (INDEPENDENT_AMBULATORY_CARE_PROVIDER_SITE_OTHER): Payer: Self-pay | Admitting: "Endocrinology

## 2017-09-20 ENCOUNTER — Ambulatory Visit (INDEPENDENT_AMBULATORY_CARE_PROVIDER_SITE_OTHER): Payer: Medicare HMO | Admitting: "Endocrinology

## 2017-09-20 VITALS — BP 130/78 | HR 88 | Wt 104.8 lb

## 2017-09-20 DIAGNOSIS — E063 Autoimmune thyroiditis: Secondary | ICD-10-CM

## 2017-09-20 DIAGNOSIS — E559 Vitamin D deficiency, unspecified: Secondary | ICD-10-CM

## 2017-09-20 DIAGNOSIS — E211 Secondary hyperparathyroidism, not elsewhere classified: Secondary | ICD-10-CM

## 2017-09-20 DIAGNOSIS — I10 Essential (primary) hypertension: Secondary | ICD-10-CM | POA: Diagnosis not present

## 2017-09-20 DIAGNOSIS — E049 Nontoxic goiter, unspecified: Secondary | ICD-10-CM

## 2017-09-20 DIAGNOSIS — R634 Abnormal weight loss: Secondary | ICD-10-CM | POA: Diagnosis not present

## 2017-09-20 DIAGNOSIS — IMO0001 Reserved for inherently not codable concepts without codable children: Secondary | ICD-10-CM

## 2017-09-20 DIAGNOSIS — E1165 Type 2 diabetes mellitus with hyperglycemia: Secondary | ICD-10-CM

## 2017-09-20 DIAGNOSIS — R5383 Other fatigue: Secondary | ICD-10-CM
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
## 2017-09-21 ENCOUNTER — Encounter: Payer: Self-pay | Admitting: Radiation Oncology

## 2017-09-21 DIAGNOSIS — S022XXD Fracture of nasal bones, subsequent encounter for fracture with routine healing: Secondary | ICD-10-CM | POA: Diagnosis not present

## 2017-09-21 DIAGNOSIS — E119 Type 2 diabetes mellitus without complications: Secondary | ICD-10-CM | POA: Diagnosis not present

## 2017-09-21 DIAGNOSIS — M25562 Pain in left knee: Secondary | ICD-10-CM | POA: Diagnosis not present

## 2017-09-21 DIAGNOSIS — S12391D Other nondisplaced fracture of fourth cervical vertebra, subsequent encounter for fracture with routine healing: Secondary | ICD-10-CM | POA: Diagnosis not present

## 2017-09-21 DIAGNOSIS — S02109D Fracture of base of skull, unspecified side, subsequent encounter for fracture with routine healing: Secondary | ICD-10-CM | POA: Diagnosis not present

## 2017-09-21 DIAGNOSIS — S0280XD Fracture of other specified skull and facial bones, unspecified side, subsequent encounter for fracture with routine healing: Secondary | ICD-10-CM | POA: Diagnosis not present

## 2017-09-21 DIAGNOSIS — C50911 Malignant neoplasm of unspecified site of right female breast: Secondary | ICD-10-CM | POA: Diagnosis not present

## 2017-09-21 DIAGNOSIS — I2510 Atherosclerotic heart disease of native coronary artery without angina pectoris: Secondary | ICD-10-CM | POA: Diagnosis not present

## 2017-09-21 DIAGNOSIS — M25561 Pain in right knee: Secondary | ICD-10-CM | POA: Diagnosis not present

## 2017-09-21 LAB — PTH, INTACT AND CALCIUM
CALCIUM: 10.3 mg/dL (ref 8.6–10.4)
PTH: 32 pg/mL (ref 14–64)

## 2017-09-21 LAB — T4, FREE: FREE T4: 1.3 ng/dL (ref 0.8–1.8)

## 2017-09-21 LAB — T3, FREE: T3, Free: 2.6 pg/mL (ref 2.3–4.2)

## 2017-09-21 LAB — TSH: TSH: 0.71 mIU/L (ref 0.40–4.50)

## 2017-09-25 DIAGNOSIS — E119 Type 2 diabetes mellitus without complications: Secondary | ICD-10-CM | POA: Diagnosis not present

## 2017-09-25 DIAGNOSIS — S12391D Other nondisplaced fracture of fourth cervical vertebra, subsequent encounter for fracture with routine healing: Secondary | ICD-10-CM | POA: Diagnosis not present

## 2017-09-25 DIAGNOSIS — I2510 Atherosclerotic heart disease of native coronary artery without angina pectoris: Secondary | ICD-10-CM | POA: Diagnosis not present

## 2017-09-25 DIAGNOSIS — S0280XD Fracture of other specified skull and facial bones, unspecified side, subsequent encounter for fracture with routine healing: Secondary | ICD-10-CM | POA: Diagnosis not present

## 2017-09-25 DIAGNOSIS — C50911 Malignant neoplasm of unspecified site of right female breast: Secondary | ICD-10-CM | POA: Diagnosis not present

## 2017-09-25 DIAGNOSIS — S02109D Fracture of base of skull, unspecified side, subsequent encounter for fracture with routine healing: Secondary | ICD-10-CM | POA: Diagnosis not present

## 2017-09-25 DIAGNOSIS — M25561 Pain in right knee: Secondary | ICD-10-CM | POA: Diagnosis not present

## 2017-09-25 DIAGNOSIS — M25562 Pain in left knee: Secondary | ICD-10-CM | POA: Diagnosis not present

## 2017-09-25 DIAGNOSIS — S022XXD Fracture of nasal bones, subsequent encounter for fracture with routine healing: Secondary | ICD-10-CM | POA: Diagnosis not present

## 2017-09-26 ENCOUNTER — Telehealth (INDEPENDENT_AMBULATORY_CARE_PROVIDER_SITE_OTHER): Payer: Self-pay | Admitting: "Endocrinology

## 2017-09-26 NOTE — Telephone Encounter (Signed)
°  Who's calling (name and relationship to patient) : Arliene (Mother) Best contact number: [phone] Provider they see: Dr. Tobe Sos Reason for call:  Pt called stating that the cream rx is not at the drug store.

## 2017-09-26 NOTE — Telephone Encounter (Signed)
Routed to provider

## 2017-09-27 ENCOUNTER — Encounter (INDEPENDENT_AMBULATORY_CARE_PROVIDER_SITE_OTHER): Payer: Self-pay | Admitting: *Deleted

## 2017-09-27 DIAGNOSIS — E119 Type 2 diabetes mellitus without complications: Secondary | ICD-10-CM | POA: Diagnosis not present

## 2017-09-27 DIAGNOSIS — S02109D Fracture of base of skull, unspecified side, subsequent encounter for fracture with routine healing: Secondary | ICD-10-CM | POA: Diagnosis not present

## 2017-09-27 DIAGNOSIS — M25561 Pain in right knee: Secondary | ICD-10-CM | POA: Diagnosis not present

## 2017-09-27 DIAGNOSIS — S022XXD Fracture of nasal bones, subsequent encounter for fracture with routine healing: Secondary | ICD-10-CM | POA: Diagnosis not present

## 2017-09-27 DIAGNOSIS — S12391D Other nondisplaced fracture of fourth cervical vertebra, subsequent encounter for fracture with routine healing: Secondary | ICD-10-CM | POA: Diagnosis not present

## 2017-09-27 DIAGNOSIS — S0280XD Fracture of other specified skull and facial bones, unspecified side, subsequent encounter for fracture with routine healing: Secondary | ICD-10-CM | POA: Diagnosis not present

## 2017-09-27 DIAGNOSIS — I2510 Atherosclerotic heart disease of native coronary artery without angina pectoris: Secondary | ICD-10-CM | POA: Diagnosis not present

## 2017-09-27 DIAGNOSIS — C50911 Malignant neoplasm of unspecified site of right female breast: Secondary | ICD-10-CM | POA: Diagnosis not present

## 2017-09-27 DIAGNOSIS — M25562 Pain in left knee: Secondary | ICD-10-CM | POA: Diagnosis not present

## 2017-10-01 DIAGNOSIS — G253 Myoclonus: Secondary | ICD-10-CM | POA: Diagnosis not present
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
## 2017-10-02 ENCOUNTER — Other Ambulatory Visit: Payer: Self-pay

## 2017-10-02 ENCOUNTER — Telehealth (INDEPENDENT_AMBULATORY_CARE_PROVIDER_SITE_OTHER): Payer: Self-pay | Admitting: "Endocrinology

## 2017-10-02 DIAGNOSIS — M1711 Unilateral primary osteoarthritis, right knee: Secondary | ICD-10-CM | POA: Diagnosis not present

## 2017-10-02 DIAGNOSIS — D0511 Intraductal carcinoma in situ of right breast: Secondary | ICD-10-CM

## 2017-10-02 NOTE — Telephone Encounter (Signed)
Routed to provider

## 2017-10-02 NOTE — Telephone Encounter (Signed)
Who's calling (name and relationship to patient) : [name] (Self) Best contact number: [phone] Jerilynn Mages) Provider they see: Tobe Sos, MD Reason for call: [facility] is calling in regards to needing a prescription for foot cream. Patient stated Dr. Tobe Sos was going to put in an order but she hasn't received it.

## 2017-10-03 ENCOUNTER — Inpatient Hospital Stay: Payer: Medicare HMO

## 2017-10-03 ENCOUNTER — Encounter: Payer: Self-pay | Admitting: Neurology

## 2017-10-03 ENCOUNTER — Inpatient Hospital Stay: Payer: Medicare HMO | Attending: Oncology | Admitting: Oncology

## 2017-10-03 VITALS — BP 130/60 | HR 60 | Temp 97.5°F | Resp 18 | Ht 66.0 in | Wt 106.0 lb

## 2017-10-03 DIAGNOSIS — E785 Hyperlipidemia, unspecified: Secondary | ICD-10-CM | POA: Diagnosis not present

## 2017-10-03 DIAGNOSIS — D0511 Intraductal carcinoma in situ of right breast: Secondary | ICD-10-CM

## 2017-10-03 DIAGNOSIS — Z171 Estrogen receptor negative status [ER-]: Secondary | ICD-10-CM | POA: Diagnosis not present

## 2017-10-03 DIAGNOSIS — Z8673 Personal history of transient ischemic attack (TIA), and cerebral infarction without residual deficits: Secondary | ICD-10-CM | POA: Diagnosis not present

## 2017-10-03 DIAGNOSIS — I4891 Unspecified atrial fibrillation: Secondary | ICD-10-CM | POA: Diagnosis not present

## 2017-10-03 DIAGNOSIS — E063 Autoimmune thyroiditis: Secondary | ICD-10-CM | POA: Insufficient documentation

## 2017-10-03 DIAGNOSIS — F319 Bipolar disorder, unspecified: Secondary | ICD-10-CM | POA: Diagnosis not present

## 2017-10-03 DIAGNOSIS — F17210 Nicotine dependence, cigarettes, uncomplicated: Secondary | ICD-10-CM

## 2017-10-03 DIAGNOSIS — E119 Type 2 diabetes mellitus without complications: Secondary | ICD-10-CM | POA: Insufficient documentation

## 2017-10-03 DIAGNOSIS — K219 Gastro-esophageal reflux disease without esophagitis: Secondary | ICD-10-CM | POA: Diagnosis not present

## 2017-10-03 DIAGNOSIS — Z79899 Other long term (current) drug therapy: Secondary | ICD-10-CM

## 2017-10-03 DIAGNOSIS — I442 Atrioventricular block, complete: Secondary | ICD-10-CM | POA: Insufficient documentation

## 2017-10-03 DIAGNOSIS — Z7901 Long term (current) use of anticoagulants: Secondary | ICD-10-CM | POA: Insufficient documentation

## 2017-10-03 DIAGNOSIS — Z950 Presence of cardiac pacemaker: Secondary | ICD-10-CM | POA: Diagnosis not present

## 2017-10-03 DIAGNOSIS — J449 Chronic obstructive pulmonary disease, unspecified: Secondary | ICD-10-CM | POA: Insufficient documentation

## 2017-10-03 DIAGNOSIS — E559 Vitamin D deficiency, unspecified: Secondary | ICD-10-CM | POA: Insufficient documentation

## 2017-10-03 DIAGNOSIS — I10 Essential (primary) hypertension: Secondary | ICD-10-CM | POA: Diagnosis not present

## 2017-10-03 DIAGNOSIS — Z7984 Long term (current) use of oral hypoglycemic drugs: Secondary | ICD-10-CM | POA: Insufficient documentation

## 2017-10-03 DIAGNOSIS — I2510 Atherosclerotic heart disease of native coronary artery without angina pectoris: Secondary | ICD-10-CM | POA: Diagnosis not present

## 2017-10-03 DIAGNOSIS — F3131 Bipolar disorder, current episode depressed, mild: Secondary | ICD-10-CM

## 2017-10-03 LAB — CMP (CANCER CENTER ONLY)
ALT: 22 U/L (ref 0–55)
AST: 22 U/L (ref 5–34)
Albumin: 4 g/dL (ref 3.5–5.0)
Alkaline Phosphatase: 52 U/L (ref 40–150)
Anion gap: 8 (ref 3–11)
BUN: 21 mg/dL (ref 7–26)
CO2: 26 mmol/L (ref 22–29)
CREATININE: 1.01 mg/dL (ref 0.60–1.10)
Calcium: 10.4 mg/dL (ref 8.4–10.4)
Chloride: 107 mmol/L (ref 98–109)
GFR, EST NON AFRICAN AMERICAN: 53 mL/min — AB (ref >60)
GFR, Est AFR Am: >60 mL/min (ref >60)
GLUCOSE: 95 mg/dL (ref 70–140)
Potassium: 4.3 mmol/L (ref 3.5–5.1)
SODIUM: 141 mmol/L (ref 136–145)
Total Bilirubin: 0.3 mg/dL (ref 0.2–1.2)
Total Protein: 7 g/dL (ref 6.4–8.3)

## 2017-10-03 LAB — CBC WITH DIFFERENTIAL (CANCER CENTER ONLY)
BASOS ABS: 0.1 10*3/uL (ref 0.0–0.1)
Basophils Relative: 1 %
EOS PCT: 2 %
Eosinophils Absolute: 0.2 10*3/uL (ref 0.0–0.5)
HCT: 41.3 % (ref 34.8–46.6)
Hemoglobin: 13.5 g/dL (ref 11.6–15.9)
LYMPHS PCT: 27 %
Lymphs Abs: 2.2 10*3/uL (ref 0.9–3.3)
MCH: 31.8 pg (ref 25.1–34.0)
MCHC: 32.7 g/dL (ref 31.5–36.0)
MCV: 97.1 fL (ref 79.5–101.0)
Monocytes Absolute: 0.6 10*3/uL (ref 0.1–0.9)
Monocytes Relative: 7 %
NEUTROS ABS: 5.1 10*3/uL (ref 1.5–6.5)
Neutrophils Relative %: 63 %
Platelet Count: 206 10*3/uL (ref 145–400)
RBC: 4.25 MIL/uL (ref 3.70–5.45)
RDW: 12.9 % (ref 11.2–14.5)
WBC: 8.1 10*3/uL (ref 3.9–10.3)

## 2017-10-04 ENCOUNTER — Telehealth: Payer: Self-pay | Admitting: Oncology

## 2017-10-04 ENCOUNTER — Encounter: Payer: Self-pay | Admitting: *Deleted

## 2017-10-04 NOTE — Telephone Encounter (Signed)
Per 2/27 no los

## 2017-10-05 DIAGNOSIS — H10413 Chronic giant papillary conjunctivitis, bilateral: Secondary | ICD-10-CM | POA: Diagnosis not present

## 2017-10-05 DIAGNOSIS — E119 Type 2 diabetes mellitus without complications: Secondary | ICD-10-CM | POA: Diagnosis not present

## 2017-10-05 DIAGNOSIS — Z961 Presence of intraocular lens: Secondary | ICD-10-CM | POA: Diagnosis not present

## 2017-10-05 DIAGNOSIS — H02206 Unspecified lagophthalmos left eye, unspecified eyelid: Secondary | ICD-10-CM | POA: Diagnosis not present

## 2017-10-05 LAB — HM DIABETES EYE EXAM
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
## 2017-10-08 ENCOUNTER — Encounter: Payer: Self-pay | Admitting: *Deleted

## 2017-10-08 ENCOUNTER — Encounter: Payer: Self-pay | Admitting: Radiation Oncology

## 2017-10-08 ENCOUNTER — Other Ambulatory Visit: Payer: Self-pay

## 2017-10-08 ENCOUNTER — Ambulatory Visit
Admission: RE | Admit: 2017-10-08 | Discharge: 2017-10-08 | Disposition: A | Discharge status: Home / Self Care | Payer: Medicare HMO | Source: Ambulatory Visit | Attending: Radiation Oncology | Admitting: Radiation Oncology

## 2017-10-08 VITALS — BP 117/65 | HR 59 | Temp 97.7°F | Ht 66.0 in | Wt 105.6 lb

## 2017-10-08 DIAGNOSIS — Z880 Allergy status to penicillin: Secondary | ICD-10-CM | POA: Insufficient documentation

## 2017-10-08 DIAGNOSIS — Z79899 Other long term (current) drug therapy: Secondary | ICD-10-CM | POA: Diagnosis not present

## 2017-10-08 DIAGNOSIS — Z171 Estrogen receptor negative status [ER-]: Secondary | ICD-10-CM | POA: Diagnosis not present

## 2017-10-08 DIAGNOSIS — E039 Hypothyroidism, unspecified: Secondary | ICD-10-CM | POA: Diagnosis not present

## 2017-10-08 DIAGNOSIS — E785 Hyperlipidemia, unspecified: Secondary | ICD-10-CM | POA: Insufficient documentation

## 2017-10-08 DIAGNOSIS — Z950 Presence of cardiac pacemaker: Secondary | ICD-10-CM | POA: Insufficient documentation

## 2017-10-08 DIAGNOSIS — I2510 Atherosclerotic heart disease of native coronary artery without angina pectoris: Secondary | ICD-10-CM | POA: Diagnosis not present

## 2017-10-08 DIAGNOSIS — D0511 Intraductal carcinoma in situ of right breast: Secondary | ICD-10-CM

## 2017-10-08 DIAGNOSIS — Z885 Allergy status to narcotic agent status: Secondary | ICD-10-CM | POA: Diagnosis not present

## 2017-10-08 DIAGNOSIS — Z881 Allergy status to other antibiotic agents status: Secondary | ICD-10-CM | POA: Insufficient documentation

## 2017-10-08 DIAGNOSIS — Z7901 Long term (current) use of anticoagulants: Secondary | ICD-10-CM | POA: Insufficient documentation

## 2017-10-08 DIAGNOSIS — Z853 Personal history of malignant neoplasm of breast: Secondary | ICD-10-CM | POA: Diagnosis not present

## 2017-10-08 DIAGNOSIS — Z79890 Hormone replacement therapy (postmenopausal): Secondary | ICD-10-CM | POA: Diagnosis not present

## 2017-10-08 DIAGNOSIS — Z7984 Long term (current) use of oral hypoglycemic drugs: Secondary | ICD-10-CM | POA: Insufficient documentation

## 2017-10-08 DIAGNOSIS — I10 Essential (primary) hypertension: Secondary | ICD-10-CM | POA: Diagnosis not present

## 2017-10-08 DIAGNOSIS — Z888 Allergy status to other drugs, medicaments and biological substances status: Secondary | ICD-10-CM | POA: Diagnosis not present

## 2017-10-08 DIAGNOSIS — E119 Type 2 diabetes mellitus without complications: Secondary | ICD-10-CM | POA: Diagnosis not present

## 2017-10-08 DIAGNOSIS — F17210 Nicotine dependence, cigarettes, uncomplicated: Secondary | ICD-10-CM | POA: Diagnosis not present

## 2017-10-08 DIAGNOSIS — Z9889 Other specified postprocedural states: Secondary | ICD-10-CM | POA: Diagnosis not present
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
## 2017-10-08 NOTE — Progress Notes (Signed)
[facility] Work  Holiday representative received referral from from radiation oncology for transportation concerns and through the distress screen protocol.  CSW called patients daughter at home to offer support and assess for needs.  CSW left patients daughter a messaging offering support with transportation resources and encouraging her to return CSW call.   Johnnye Lana, MSW, LCSW, OSW-C Clinical Social Worker [facility] [phone]

## 2017-10-08 NOTE — Progress Notes (Signed)
[facility] Psychosocial Distress Screening Clinical Social Work  Clinical Social Work was referred by distress screening protocol.  The patient scored a 7 on the Psychosocial Distress Thermometer which indicates moderate distress. Clinical Social Worker spoke with patients daughter to assess for distress and other psychosocial needs.  Patients daughter stated the majority of patients anxiety was due to transportation concerns.   Patients daughter will be providing the majority of her transportation, but is also working.  CSW informed patients daughter of possible transportation benefit through her [facility].  CSW encouraged patient and patients daughter to call the number listed on the back of patients insurance card to determine the transportation benefit.  CSW also provided education on [facility] and [employer].  Patient also has a supportive church family who has also volunteered to help with patients needs.  Patients daughter was appreciative of CSW contact and information, and plans to contact CSW if there are additional transportation needs.  CSW provided additional information on the support team and support services at [facility] and encouraged patient and patients daughter to call with any questions roc concerns.    ONCBCN DISTRESS SCREENING 10/08/2017  Screening Type Initial Screening  Distress experienced in past week (1-10) 7  Practical problem type Transportation  Family Problem type Other (comment)  Emotional problem type Nervousness/Anxiety  Physical Problem type Loss of appetitie    Johnnye Lana, MSW, LCSW, OSW-C Clinical Social Worker [facility] [phone]

## 2017-10-09 ENCOUNTER — Encounter: Payer: Self-pay | Admitting: Neurology

## 2017-10-09 ENCOUNTER — Ambulatory Visit: Payer: Medicare HMO | Admitting: Neurology

## 2017-10-09 VITALS — BP 112/60 | HR 62 | Ht 66.0 in | Wt 107.0 lb

## 2017-10-09 DIAGNOSIS — G251 Drug-induced tremor: Secondary | ICD-10-CM

## 2017-10-09 DIAGNOSIS — G253 Myoclonus: Secondary | ICD-10-CM

## 2017-10-09 DIAGNOSIS — R413 Other amnesia: Secondary | ICD-10-CM

## 2017-10-09 NOTE — Telephone Encounter (Signed)
Routed to provider

## 2017-10-09 NOTE — Telephone Encounter (Signed)
Who's calling (name and relationship to patient) : Stacy [name] (patient) Best contact number: [phone] Provider they see: Tobe Sos  Reason for call: Patient came by office about foot cream she stated Dr Tobe Sos was going to send in a Rx for.  Please call.  She did not have details about the foot cream.  This is patient's second request about foot cream.  Please call.      PRESCRIPTION REFILL ONLY  Name of prescription:  Pharmacy:

## 2017-10-09 NOTE — Patient Instructions (Signed)
1. You have been referred for a neurocognitive evaluation in our office.   The evaluation takes approximately two hours. The first part of the appointment is a clinical interview with the neuropsychologist (Dr. MaryBeth Bailar). Please bring someone with you to this appointment if possible, as it is helpful for Dr. Bailar to hear from both you and another adult who knows you well. After speaking with Dr. Bailar, you will complete testing with her technician. The testing includes a variety of tasks- mostly question-and-answer, some paper-and-pencil. There is nothing you need to do to prepare for this appointment, but having a good night's sleep prior to the testing, and bringing eyeglasses and hearing aids (if you wear them), is advised.   About a week after the evaluation, you will return to follow up with Dr. Bailar to review the test results. This appointment is about 30 minutes. If you would like a family member to receive this information as well, please bring them to the appointment.   We have to reserve several hours of the neuropsychologist's time and the psychometrician's time for your evaluation appointment. As such, please note that there is a No-Show fee of $100. If you are unable to attend any of your appointments, please contact our office as soon as possible to reschedule.   

## 2017-10-10 ENCOUNTER — Telehealth (INDEPENDENT_AMBULATORY_CARE_PROVIDER_SITE_OTHER): Payer: Self-pay | Admitting: *Deleted

## 2017-10-10 MED ORDER — KETOCONAZOLE-HYDROCORTISONE 2 & 1 % EX KIT: PACK | CUTANEOUS | 5 refills | Status: DC

## 2017-10-10 NOTE — Addendum Note (Signed)
Addended by: Sherrlyn Hock on: 10/10/2017 12:38 PM   Modules accepted: Orders

## 2017-10-10 NOTE — Telephone Encounter (Signed)
LVM on daughter Nicoles VM, Dr. Tobe Sos sent foot cream script to [facility] on [address]

## 2017-10-10 NOTE — Telephone Encounter (Signed)
Handled by Dr. Tobe Sos.

## 2017-10-12 ENCOUNTER — Telehealth (INDEPENDENT_AMBULATORY_CARE_PROVIDER_SITE_OTHER): Payer: Self-pay | Admitting: "Endocrinology

## 2017-10-12 NOTE — Telephone Encounter (Signed)
Routed to Dr. Brennan 

## 2017-10-12 NOTE — Telephone Encounter (Signed)
°  Who's calling (name and relationship to patient) : [name] (Daughter) Best contact number: [phone] Provider they see: Dr. Tobe Sos Reason for call: Daughter stated that, per [facility], insurance will not cover foot cream. [facility] has faxed a form for Dr. Tobe Sos to fill out that would approve it. [facility] wants to know if there is any way that this could be expedited? If this does not work out, daughter wants to know if there is a an alternative rx that Dr. Tobe Sos can prescribe that insurance will cover and/or one that is cheaper?

## 2017-10-15 ENCOUNTER — Other Ambulatory Visit (INDEPENDENT_AMBULATORY_CARE_PROVIDER_SITE_OTHER): Payer: Self-pay | Admitting: *Deleted

## 2017-10-15 MED ORDER — KETOCONAZOLE 2 % EX CREA: 1.0000 "application " | TOPICAL_CREAM | Freq: Every day | CUTANEOUS | 5 refills | Status: DC

## 2017-10-15 NOTE — Telephone Encounter (Signed)
Done

## 2017-10-18 ENCOUNTER — Ambulatory Visit
Admission: RE | Admit: 2017-10-18 | Discharge: 2017-10-18 | Disposition: A | Discharge status: Home / Self Care | Payer: Medicare HMO | Source: Ambulatory Visit | Attending: Radiation Oncology | Admitting: Radiation Oncology

## 2017-10-18 DIAGNOSIS — D0511 Intraductal carcinoma in situ of right breast: Secondary | ICD-10-CM | POA: Diagnosis not present

## 2017-10-18 DIAGNOSIS — Z79899 Other long term (current) drug therapy: Secondary | ICD-10-CM | POA: Insufficient documentation

## 2017-10-18 NOTE — Progress Notes (Signed)
  Radiation Oncology         [phone] ________________________________  Name: Stacy [name] MRN: [id number]  Date: 10/18/2017  DOB: 09/13/1940  SIMULATION AND TREATMENT PLANNING NOTE    ICD-10-CM   1. Ductal carcinoma in situ (DCIS) of right breast D05.11     DIAGNOSIS: High-grade, DCIS of right breast, ER/PR (neg) Pathological stage: pTis, pNX  NARRATIVE:  The patient was brought to the [facility].  Identity was confirmed.  All relevant records and images related to the planned course of therapy were reviewed.  The patient freely provided informed written consent to proceed with treatment after reviewing the details related to the planned course of therapy. The consent form was witnessed and verified by the simulation staff.  Then, the patient was set-up in a stable reproducible  supine position for radiation therapy.  CT images were obtained.  Surface markings were placed.  The CT images were loaded into the planning software.  Then the target and avoidance structures were contoured.  Treatment planning then occurred.  The radiation prescription was entered and confirmed.  Then, I designed and supervised the construction of a total of 3 medically necessary complex treatment devices.  I have requested : 3D Simulation  I have requested a DVH of the following structures: heart, lungs, and lumpectomy cavity.  I have ordered:dose calc.  PLAN:  The patient will receive 40.05 Gy in 15 fractions. This will be followed by a boost to the lumpectomy cavity of 10 Gy in 5 fractions.    Optical Surface Tracking Plan:  Since intensity modulated radiotherapy (IMRT) and 3D conformal radiation treatment methods are predicated on accurate and precise positioning for treatment, intrafraction motion monitoring is medically necessary to ensure accurate and safe treatment delivery.  The ability to quantify intrafraction motion without excessive ionizing radiation dose can only be performed  with optical surface tracking. Accordingly, surface imaging offers the opportunity to obtain 3D measurements of patient position throughout IMRT and 3D treatments without excessive radiation exposure.  I am ordering optical surface tracking for this patient's upcoming course of radiotherapy. ________________________________    -----------------------------------  Blair Promise, PhD, MD  This document serves as a record of services personally performed by Gery Pray, MD. It was created on his behalf by Bethann Humble, a trained medical scribe. The creation of this record is based on the scribe's personal observations and the provider's statements to them. This document has been checked and approved by the attending provider.

## 2017-10-22 DIAGNOSIS — Z79899 Other long term (current) drug therapy: Secondary | ICD-10-CM | POA: Diagnosis not present

## 2017-10-22 DIAGNOSIS — D0511 Intraductal carcinoma in situ of right breast: Secondary | ICD-10-CM | POA: Diagnosis not present

## 2017-10-24 ENCOUNTER — Ambulatory Visit
Admission: RE | Admit: 2017-10-24 | Discharge: 2017-10-24 | Disposition: A | Discharge status: Home / Self Care | Payer: Medicare HMO | Source: Ambulatory Visit | Attending: Radiation Oncology | Admitting: Radiation Oncology

## 2017-10-24 DIAGNOSIS — D0511 Intraductal carcinoma in situ of right breast: Secondary | ICD-10-CM | POA: Diagnosis not present

## 2017-10-24 DIAGNOSIS — Z79899 Other long term (current) drug therapy: Secondary | ICD-10-CM | POA: Diagnosis not present

## 2017-10-24 MED ORDER — ALRA NON-METALLIC DEODORANT (RAD-ONC)
1.0000 "application " | Freq: Once | TOPICAL | Status: AC
  Administered 2017-10-24: 1 via TOPICAL

## 2017-10-24 MED ORDER — RADIAPLEXRX EX GEL
Freq: Once | CUTANEOUS | Status: AC
  Administered 2017-10-24: 11:00:00 via TOPICAL

## 2017-10-24 NOTE — Progress Notes (Signed)
  Radiation Oncology         [phone] ________________________________  Name: COLENE MINES MRN: [id number]  Date: 10/24/2017  DOB: [birth year]  Simulation Verification Note    ICD-10-CM   1. Ductal carcinoma in situ (DCIS) of right breast D05.11 hyaluronate sodium (RADIAPLEXRX) gel    non-metallic deodorant (ALRA) 1 application    Status: outpatient  NARRATIVE: The patient was brought to the treatment unit and placed in the planned treatment position. The clinical setup was verified. Then port films were obtained and uploaded to the radiation oncology medical record software.  The treatment beams were carefully compared against the planned radiation fields. The position location and shape of the radiation fields was reviewed. They targeted volume of tissue appears to be appropriately covered by the radiation beams. Organs at risk appear to be excluded as planned.  Based on my personal review, I approved the simulation verification. The patient's treatment will proceed as planned.  -----------------------------------  Blair Promise, PhD, MD

## 2017-10-24 NOTE — Progress Notes (Signed)
Pt here for patient teaching.  Pt given Radiation and You booklet, skin care instructions, Alra deodorant and Radiaplex gel. Reviewed areas of pertinence such as fatigue and skin changes . Pt able to give teach back of to pat skin and use unscented/gentle soap,apply Radiaplex bid and avoid applying anything to skin within 4 hours of treatment. Pt demonstrated understanding and verbalizes understanding of information given and will contact nursing with any questions or concerns.        

## 2017-10-25 ENCOUNTER — Ambulatory Visit
Admission: RE | Admit: 2017-10-25 | Discharge: 2017-10-25 | Disposition: A | Discharge status: Home / Self Care | Payer: Medicare HMO | Source: Ambulatory Visit | Attending: Radiation Oncology | Admitting: Radiation Oncology

## 2017-10-25 DIAGNOSIS — D0511 Intraductal carcinoma in situ of right breast: Secondary | ICD-10-CM | POA: Diagnosis not present

## 2017-10-25 DIAGNOSIS — Z79899 Other long term (current) drug therapy: Secondary | ICD-10-CM | POA: Diagnosis not present

## 2017-10-26 ENCOUNTER — Ambulatory Visit
Admission: RE | Admit: 2017-10-26 | Discharge: 2017-10-26 | Disposition: A | Discharge status: Home / Self Care | Payer: Medicare HMO | Source: Ambulatory Visit | Attending: Radiation Oncology | Admitting: Radiation Oncology

## 2017-10-26 DIAGNOSIS — Z79899 Other long term (current) drug therapy: Secondary | ICD-10-CM | POA: Diagnosis not present

## 2017-10-26 DIAGNOSIS — D0511 Intraductal carcinoma in situ of right breast: Secondary | ICD-10-CM | POA: Diagnosis not present

## 2017-10-29 ENCOUNTER — Ambulatory Visit
Admission: RE | Admit: 2017-10-29 | Discharge: 2017-10-29 | Disposition: A | Discharge status: Home / Self Care | Payer: Medicare HMO | Source: Ambulatory Visit | Attending: Radiation Oncology | Admitting: Radiation Oncology

## 2017-10-29 DIAGNOSIS — Z79899 Other long term (current) drug therapy: Secondary | ICD-10-CM | POA: Diagnosis not present

## 2017-10-29 DIAGNOSIS — D0511 Intraductal carcinoma in situ of right breast: Secondary | ICD-10-CM | POA: Diagnosis not present

## 2017-10-30 ENCOUNTER — Ambulatory Visit
Admission: RE | Admit: 2017-10-30 | Discharge: 2017-10-30 | Disposition: A | Discharge status: Home / Self Care | Payer: Medicare HMO | Source: Ambulatory Visit | Attending: Radiation Oncology | Admitting: Radiation Oncology

## 2017-10-30 ENCOUNTER — Telehealth: Payer: Self-pay | Admitting: Emergency Medicine

## 2017-10-30 DIAGNOSIS — Z79899 Other long term (current) drug therapy: Secondary | ICD-10-CM | POA: Diagnosis not present

## 2017-10-30 DIAGNOSIS — D0511 Intraductal carcinoma in situ of right breast: Secondary | ICD-10-CM | POA: Diagnosis not present

## 2017-10-30 NOTE — Telephone Encounter (Signed)
LVM for pt to call back and discuss.  

## 2017-10-30 NOTE — Telephone Encounter (Signed)
Copied from [place]. Topic: Quick Communication - Rx Refill/Question >> Oct 30, 2017 10:40 AM Antonieta Iba C wrote: Medication: VESICARE 5 MG tablet   Has the patient contacted their pharmacy? Yes   (Agent: If no, request that the patient contact the pharmacy for the refill.)  Preferred Pharmacy (with phone number or street name): [place], [place] [phone] (Phone) [phone] (Fax)     Agent: Please be advised that RX refills may take up to 3 business days. We ask that you follow-up with your pharmacy.

## 2017-10-31 ENCOUNTER — Ambulatory Visit
Admission: RE | Admit: 2017-10-31 | Discharge: 2017-10-31 | Disposition: A | Discharge status: Home / Self Care | Payer: Medicare HMO | Source: Ambulatory Visit | Attending: Radiation Oncology | Admitting: Radiation Oncology

## 2017-10-31 DIAGNOSIS — Z79899 Other long term (current) drug therapy: Secondary | ICD-10-CM | POA: Diagnosis not present

## 2017-10-31 DIAGNOSIS — D0511 Intraductal carcinoma in situ of right breast: Secondary | ICD-10-CM | POA: Diagnosis not present

## 2017-11-01 ENCOUNTER — Ambulatory Visit
Admission: RE | Admit: 2017-11-01 | Discharge: 2017-11-01 | Disposition: A | Discharge status: Home / Self Care | Payer: Medicare HMO | Source: Ambulatory Visit | Attending: Radiation Oncology | Admitting: Radiation Oncology

## 2017-11-01 DIAGNOSIS — Z79899 Other long term (current) drug therapy: Secondary | ICD-10-CM | POA: Diagnosis not present

## 2017-11-01 DIAGNOSIS — D0511 Intraductal carcinoma in situ of right breast: Secondary | ICD-10-CM | POA: Diagnosis not present

## 2017-11-02 ENCOUNTER — Ambulatory Visit
Admission: RE | Admit: 2017-11-02 | Discharge: 2017-11-02 | Disposition: A | Discharge status: Home / Self Care | Payer: Medicare HMO | Source: Ambulatory Visit | Attending: Radiation Oncology | Admitting: Radiation Oncology

## 2017-11-02 DIAGNOSIS — Z79899 Other long term (current) drug therapy: Secondary | ICD-10-CM | POA: Diagnosis not present

## 2017-11-02 DIAGNOSIS — D0511 Intraductal carcinoma in situ of right breast: Secondary | ICD-10-CM | POA: Diagnosis not present

## 2017-11-05 ENCOUNTER — Telehealth: Payer: Self-pay | Admitting: Emergency Medicine

## 2017-11-05 ENCOUNTER — Telehealth: Payer: Self-pay | Admitting: Internal Medicine

## 2017-11-05 ENCOUNTER — Ambulatory Visit
Admission: RE | Admit: 2017-11-05 | Discharge: 2017-11-05 | Disposition: A | Discharge status: Home / Self Care | Payer: Medicare HMO | Source: Ambulatory Visit | Attending: Radiation Oncology | Admitting: Radiation Oncology

## 2017-11-05 ENCOUNTER — Other Ambulatory Visit: Payer: Self-pay

## 2017-11-05 DIAGNOSIS — M25562 Pain in left knee: Secondary | ICD-10-CM

## 2017-11-05 DIAGNOSIS — Z510 Encounter for antineoplastic radiation therapy: Secondary | ICD-10-CM | POA: Diagnosis not present

## 2017-11-05 DIAGNOSIS — S022XXA Fracture of nasal bones, initial encounter for closed fracture: Secondary | ICD-10-CM

## 2017-11-05 DIAGNOSIS — Z170 Estrogen receptor positive status [ER+]: Secondary | ICD-10-CM | POA: Diagnosis not present

## 2017-11-05 DIAGNOSIS — D0511 Intraductal carcinoma in situ of right breast: Secondary | ICD-10-CM | POA: Insufficient documentation

## 2017-11-05 DIAGNOSIS — M25561 Pain in right knee: Secondary | ICD-10-CM

## 2017-11-05 DIAGNOSIS — Z8781 Personal history of (healed) traumatic fracture: Secondary | ICD-10-CM

## 2017-11-05 DIAGNOSIS — S0232XA Fracture of orbital floor, left side, initial encounter for closed fracture: Secondary | ICD-10-CM

## 2017-11-05 MED ORDER — SOLIFENACIN SUCCINATE 5 MG PO TABS: 5.0000 mg | ORAL_TABLET | Freq: Every day | ORAL | 1 refills | Status: DC

## 2017-11-05 NOTE — Telephone Encounter (Signed)
Copied from [place]. Topic: Quick Communication - Rx Refill/Question >> Oct 30, 2017 10:40 AM Antonieta Iba C wrote: Medication: VESICARE 5 MG tablet   Has the patient contacted their pharmacy? Yes   (Agent: If no, request that the patient contact the pharmacy for the refill.)  Preferred Pharmacy (with phone number or street name): [place], [place] [phone] (Phone) [phone] (Fax)     Agent: Please be advised that RX refills may take up to 3 business days. We ask that you follow-up with your pharmacy. >> Nov 05, 2017  1:50 PM Scherrie Gerlach wrote: Pt called to switch this request to   [place], [place] [phone] (Phone) [phone] (Fax)

## 2017-11-05 NOTE — Telephone Encounter (Signed)
Sent in by Linus Orn, RN at [facility]  Copied from [place]. Topic: Quick Communication - Rx Refill/Question >> Oct 30, 2017 10:40 AM Antonieta Iba C wrote: Medication: VESICARE 5 MG tablet   Has the patient contacted their pharmacy? Yes   (Agent: If no, request that the patient contact the pharmacy for the refill.)  Preferred Pharmacy (with phone number or street name): [place], [place] [phone] (Phone) [phone] (Fax)     Agent: Please be advised that RX refills may take up to 3 business days. We ask that you follow-up with your pharmacy. >> Nov 05, 2017  1:50 PM Scherrie Gerlach wrote: Pt called to switch this request to [place]

## 2017-11-05 NOTE — Telephone Encounter (Signed)
ordered
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
## 2017-11-06 ENCOUNTER — Ambulatory Visit
Admission: RE | Admit: 2017-11-06 | Discharge: 2017-11-06 | Disposition: A | Discharge status: Home / Self Care | Payer: Medicare HMO | Source: Ambulatory Visit | Attending: Radiation Oncology | Admitting: Radiation Oncology

## 2017-11-06 ENCOUNTER — Telehealth: Payer: Self-pay | Admitting: Oncology

## 2017-11-06 DIAGNOSIS — D0511 Intraductal carcinoma in situ of right breast: Secondary | ICD-10-CM | POA: Diagnosis not present

## 2017-11-06 DIAGNOSIS — Z510 Encounter for antineoplastic radiation therapy: Secondary | ICD-10-CM | POA: Diagnosis not present

## 2017-11-06 DIAGNOSIS — Z170 Estrogen receptor positive status [ER+]: Secondary | ICD-10-CM | POA: Diagnosis not present

## 2017-11-06 NOTE — Telephone Encounter (Signed)
Left message for patient with new Date/Time of rescheduled appointment per 4/2 phone msg

## 2017-11-06 NOTE — Telephone Encounter (Signed)
LVM informing pt referral had been placed.

## 2017-11-07 ENCOUNTER — Ambulatory Visit
Admission: RE | Admit: 2017-11-07 | Discharge: 2017-11-07 | Disposition: A | Discharge status: Home / Self Care | Payer: Medicare HMO | Source: Ambulatory Visit | Attending: Radiation Oncology | Admitting: Radiation Oncology

## 2017-11-07 DIAGNOSIS — D0511 Intraductal carcinoma in situ of right breast: Secondary | ICD-10-CM | POA: Diagnosis not present

## 2017-11-07 DIAGNOSIS — Z170 Estrogen receptor positive status [ER+]: Secondary | ICD-10-CM | POA: Diagnosis not present

## 2017-11-07 DIAGNOSIS — Z510 Encounter for antineoplastic radiation therapy: Secondary | ICD-10-CM | POA: Diagnosis not present

## 2017-11-07 LAB — CUP PACEART REMOTE DEVICE CHECK
Battery Impedance: 158 Ohm
Brady Statistic AS VP Percent: 0 %
Brady Statistic AS VS Percent: 54 %
Date Time Interrogation Session: 20190403121531
Implantable Lead Implant Date: 20010521
Implantable Lead Implant Date: 20010521
Implantable Lead Location: 753859
Lead Channel Impedance Value: 1086 Ohm
Lead Channel Pacing Threshold Amplitude: 0.625 V
Lead Channel Pacing Threshold Amplitude: 1.25 V
Lead Channel Setting Pacing Amplitude: 2 V
Lead Channel Setting Pacing Amplitude: 2.5 V
Lead Channel Setting Sensing Sensitivity: 5.6 mV
MDC IDC LEAD LOCATION: 753860
MDC IDC MSMT BATTERY REMAINING LONGEVITY: 141 mo
MDC IDC MSMT BATTERY VOLTAGE: 2.8 V
MDC IDC MSMT LEADCHNL RA IMPEDANCE VALUE: 675 Ohm
MDC IDC MSMT LEADCHNL RA PACING THRESHOLD PULSEWIDTH: 0.4 ms
MDC IDC MSMT LEADCHNL RV PACING THRESHOLD PULSEWIDTH: 0.4 ms
MDC IDC PG IMPLANT DT: 20161229
MDC IDC SET LEADCHNL RV PACING PULSEWIDTH: 0.4 ms
MDC IDC STAT BRADY AP VP PERCENT: 7 %
MDC IDC STAT BRADY AP VS PERCENT: 38 %

## 2017-11-07 NOTE — Telephone Encounter (Signed)
I sent referral request to my rep from [place]. This was her response:   Sheran Lawless, Abagail Kitchens, Ruthell Rummage.  I think we can work with this new referral and the office visit note from January. I'll let you know if we run into any problems.  thank you!  Melissa

## 2017-11-08 ENCOUNTER — Ambulatory Visit (INDEPENDENT_AMBULATORY_CARE_PROVIDER_SITE_OTHER): Payer: Medicare HMO | Admitting: *Deleted

## 2017-11-08 ENCOUNTER — Ambulatory Visit
Admission: RE | Admit: 2017-11-08 | Discharge: 2017-11-08 | Disposition: A | Discharge status: Home / Self Care | Payer: Medicare HMO | Source: Ambulatory Visit | Attending: Radiation Oncology | Admitting: Radiation Oncology

## 2017-11-08 DIAGNOSIS — I442 Atrioventricular block, complete: Secondary | ICD-10-CM

## 2017-11-08 DIAGNOSIS — Z510 Encounter for antineoplastic radiation therapy: Secondary | ICD-10-CM | POA: Diagnosis not present

## 2017-11-08 DIAGNOSIS — Z170 Estrogen receptor positive status [ER+]: Secondary | ICD-10-CM | POA: Diagnosis not present

## 2017-11-08 DIAGNOSIS — D0511 Intraductal carcinoma in situ of right breast: Secondary | ICD-10-CM | POA: Diagnosis not present

## 2017-11-08 NOTE — Progress Notes (Signed)
Remote pacemaker transmission.   

## 2017-11-09 ENCOUNTER — Ambulatory Visit
Admission: RE | Admit: 2017-11-09 | Discharge: 2017-11-09 | Disposition: A | Discharge status: Home / Self Care | Payer: Medicare HMO | Source: Ambulatory Visit | Attending: Radiation Oncology | Admitting: Radiation Oncology

## 2017-11-09 DIAGNOSIS — Z510 Encounter for antineoplastic radiation therapy: Secondary | ICD-10-CM | POA: Diagnosis not present

## 2017-11-09 DIAGNOSIS — D0511 Intraductal carcinoma in situ of right breast: Secondary | ICD-10-CM | POA: Diagnosis not present

## 2017-11-09 DIAGNOSIS — Z170 Estrogen receptor positive status [ER+]: Secondary | ICD-10-CM | POA: Diagnosis not present

## 2017-11-12 ENCOUNTER — Ambulatory Visit
Admission: RE | Admit: 2017-11-12 | Discharge: 2017-11-12 | Disposition: A | Discharge status: Home / Self Care | Payer: Medicare HMO | Source: Ambulatory Visit | Attending: Radiation Oncology | Admitting: Radiation Oncology

## 2017-11-12 DIAGNOSIS — Z510 Encounter for antineoplastic radiation therapy: Secondary | ICD-10-CM | POA: Diagnosis not present

## 2017-11-12 DIAGNOSIS — D0511 Intraductal carcinoma in situ of right breast: Secondary | ICD-10-CM | POA: Diagnosis not present

## 2017-11-12 DIAGNOSIS — Z170 Estrogen receptor positive status [ER+]: Secondary | ICD-10-CM | POA: Diagnosis not present

## 2017-11-13 ENCOUNTER — Ambulatory Visit: Payer: Medicare HMO | Admitting: Radiation Oncology

## 2017-11-13 ENCOUNTER — Other Ambulatory Visit: Payer: Self-pay | Admitting: Internal Medicine

## 2017-11-13 ENCOUNTER — Ambulatory Visit
Admission: RE | Admit: 2017-11-13 | Discharge: 2017-11-13 | Disposition: A | Discharge status: Home / Self Care | Payer: Medicare HMO | Source: Ambulatory Visit | Attending: Radiation Oncology | Admitting: Radiation Oncology

## 2017-11-13 ENCOUNTER — Telehealth: Payer: Self-pay | Admitting: Oncology

## 2017-11-13 DIAGNOSIS — Z170 Estrogen receptor positive status [ER+]: Secondary | ICD-10-CM | POA: Diagnosis not present

## 2017-11-13 DIAGNOSIS — Z510 Encounter for antineoplastic radiation therapy: Secondary | ICD-10-CM | POA: Diagnosis not present

## 2017-11-13 DIAGNOSIS — D0511 Intraductal carcinoma in situ of right breast: Secondary | ICD-10-CM

## 2017-11-13 MED ORDER — SONAFINE EX EMUL
1.0000 "application " | Freq: Two times a day (BID) | CUTANEOUS | Status: DC
  Administered 2017-11-13: 1 via TOPICAL

## 2017-11-13 NOTE — Telephone Encounter (Signed)
Patient left a message asking if it is OK for her to have a cortisone shot in her left knee during radiation.  She said she asked Dr. Sondra Come today but can't remember his response.  Called her back and let her know that it is OK to get a cortisone shot now.

## 2017-11-14 ENCOUNTER — Ambulatory Visit
Admission: RE | Admit: 2017-11-14 | Discharge: 2017-11-14 | Disposition: A | Discharge status: Home / Self Care | Payer: Medicare HMO | Source: Ambulatory Visit | Attending: Radiation Oncology | Admitting: Radiation Oncology

## 2017-11-14 ENCOUNTER — Encounter: Payer: Self-pay | Admitting: Cardiology

## 2017-11-14 DIAGNOSIS — Z170 Estrogen receptor positive status [ER+]: Secondary | ICD-10-CM | POA: Diagnosis not present

## 2017-11-14 DIAGNOSIS — D0511 Intraductal carcinoma in situ of right breast: Secondary | ICD-10-CM | POA: Diagnosis not present

## 2017-11-14 DIAGNOSIS — Z510 Encounter for antineoplastic radiation therapy: Secondary | ICD-10-CM | POA: Diagnosis not present

## 2017-11-14 MED ORDER — OXYBUTYNIN CHLORIDE 5 MG PO TABS: 5.0000 mg | ORAL_TABLET | Freq: Two times a day (BID) | ORAL | 1 refills | Status: DC

## 2017-11-14 NOTE — Telephone Encounter (Signed)
Pharmacy would like to change from Vesicare to Oxybutynin 5mg . Please advise

## 2017-11-14 NOTE — Telephone Encounter (Signed)
New med pending - not sure how to d/c vesicare.

## 2017-11-15 ENCOUNTER — Ambulatory Visit
Admission: RE | Admit: 2017-11-15 | Discharge: 2017-11-15 | Disposition: A | Discharge status: Home / Self Care | Payer: Medicare HMO | Source: Ambulatory Visit | Attending: Radiation Oncology | Admitting: Radiation Oncology

## 2017-11-15 DIAGNOSIS — Z510 Encounter for antineoplastic radiation therapy: Secondary | ICD-10-CM | POA: Diagnosis not present

## 2017-11-15 DIAGNOSIS — Z170 Estrogen receptor positive status [ER+]: Secondary | ICD-10-CM | POA: Diagnosis not present

## 2017-11-15 DIAGNOSIS — D0511 Intraductal carcinoma in situ of right breast: Secondary | ICD-10-CM | POA: Diagnosis not present

## 2017-11-16 ENCOUNTER — Ambulatory Visit
Admission: RE | Admit: 2017-11-16 | Discharge: 2017-11-16 | Disposition: A | Discharge status: Home / Self Care | Payer: Medicare HMO | Source: Ambulatory Visit | Attending: Radiation Oncology | Admitting: Radiation Oncology

## 2017-11-16 DIAGNOSIS — Z170 Estrogen receptor positive status [ER+]: Secondary | ICD-10-CM | POA: Diagnosis not present

## 2017-11-16 DIAGNOSIS — Z510 Encounter for antineoplastic radiation therapy: Secondary | ICD-10-CM | POA: Diagnosis not present

## 2017-11-16 DIAGNOSIS — D0511 Intraductal carcinoma in situ of right breast: Secondary | ICD-10-CM | POA: Diagnosis not present

## 2017-11-19 ENCOUNTER — Ambulatory Visit
Admission: RE | Admit: 2017-11-19 | Discharge: 2017-11-19 | Disposition: A | Discharge status: Home / Self Care | Payer: Medicare HMO | Source: Ambulatory Visit | Attending: Radiation Oncology | Admitting: Radiation Oncology

## 2017-11-19 DIAGNOSIS — D0511 Intraductal carcinoma in situ of right breast: Secondary | ICD-10-CM | POA: Diagnosis not present

## 2017-11-19 DIAGNOSIS — Z170 Estrogen receptor positive status [ER+]: Secondary | ICD-10-CM | POA: Diagnosis not present

## 2017-11-19 DIAGNOSIS — Z510 Encounter for antineoplastic radiation therapy: Secondary | ICD-10-CM | POA: Diagnosis not present

## 2017-11-20 ENCOUNTER — Encounter: Payer: Self-pay | Admitting: Radiation Oncology

## 2017-11-20 ENCOUNTER — Ambulatory Visit
Admission: RE | Admit: 2017-11-20 | Discharge: 2017-11-20 | Disposition: A | Discharge status: Home / Self Care | Payer: Medicare HMO | Source: Ambulatory Visit | Attending: Radiation Oncology | Admitting: Radiation Oncology

## 2017-11-20 DIAGNOSIS — D0511 Intraductal carcinoma in situ of right breast: Secondary | ICD-10-CM | POA: Diagnosis not present

## 2017-11-20 DIAGNOSIS — Z170 Estrogen receptor positive status [ER+]: Secondary | ICD-10-CM | POA: Diagnosis not present

## 2017-11-20 DIAGNOSIS — Z510 Encounter for antineoplastic radiation therapy: Secondary | ICD-10-CM | POA: Diagnosis not present

## 2017-11-20 DIAGNOSIS — M1712 Unilateral primary osteoarthritis, left knee: Secondary | ICD-10-CM | POA: Diagnosis not present

## 2017-11-20 MED ORDER — RADIAPLEXRX EX GEL
Freq: Once | CUTANEOUS | Status: AC
  Administered 2017-11-20: 11:00:00 via TOPICAL

## 2017-11-20 NOTE — Progress Notes (Signed)
  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 Right breast, 2.67 Gy in 15 fractions for a total dose of 40.05 Gy           2. Boost, 2 Gy in 5 fractions, for a total dose pf 10 Gy  Beams/energy:   1. 3D, 6X        2. 3D, 6X  Narrative: The patient tolerated radiation treatment relatively well. Towards the end of treatment, she noted pain, itching, redness, rash to right breast. She also noted fatigue and reported a fair appetite and that she consumes small portions and supplements with Ensure. She has been using topical aloe vera and radiaplex.    Plan: The patient has completed radiation treatment. The patient will return to radiation oncology clinic for routine followup in one month. I advised them to call or return sooner if they have any questions or concerns related to their recovery or treatment.  -----------------------------------  Blair Promise, PhD, MD  This document serves as a record of services personally performed by Gery Pray, MD. It was created on his behalf by [facility], a trained medical scribe. The creation of this record is based on the scribe's personal observations and the provider's statements to them. This document has been checked and approved by the attending provider.

## 2017-11-26 ENCOUNTER — Other Ambulatory Visit: Payer: Self-pay | Admitting: Internal Medicine

## 2017-12-17 ENCOUNTER — Telehealth: Payer: Self-pay | Admitting: Internal Medicine

## 2017-12-17 NOTE — Telephone Encounter (Signed)
Spoke with pt and advised her that she could wear alert bracelet and necklaces without interfering with her device.  She had no additional questions.

## 2017-12-17 NOTE — Telephone Encounter (Signed)
New Message:      Pt is wanting to know if she can wear a medical bracelet or necklace or will it interfere with her device. If not the pt ask what type of alert device would we recommend.

## 2017-12-20 ENCOUNTER — Other Ambulatory Visit: Payer: Self-pay

## 2017-12-20 ENCOUNTER — Ambulatory Visit
Admission: RE | Admit: 2017-12-20 | Discharge: 2017-12-20 | Disposition: A | Discharge status: Home / Self Care | Payer: Medicare HMO | Source: Ambulatory Visit | Attending: Radiation Oncology | Admitting: Radiation Oncology

## 2017-12-20 ENCOUNTER — Encounter: Payer: Self-pay | Admitting: Radiation Oncology

## 2017-12-20 VITALS — BP 124/76 | HR 59 | Temp 97.6°F | Resp 18 | Wt 107.2 lb

## 2017-12-20 DIAGNOSIS — D0511 Intraductal carcinoma in situ of right breast: Secondary | ICD-10-CM

## 2017-12-20 NOTE — Progress Notes (Signed)
Radiation Oncology         [phone] ________________________________  Name: Stacy ARMENDAREZ MRN: [id number]  Date: 12/20/2017  DOB: [birth year]  Follow-Up Visit Note  CC: Binnie Rail, MD  Stacy Gemma, MD    ICD-10-CM   1. Ductal carcinoma in situ (DCIS) of right breast D05.11     Diagnosis: High-grade, DCIS of right breast, ER/PR (neg) Pathological stage:pTis, pNX     Interval Since Last Radiation:  4 weeks ago   10/24/2017 - 11/20/2017  1. Right breast, 2.67 Gy in 15 fractions for a total dose of 40.05 Gy  2. Boost, 2 Gy in 5 fractions, for a total dose of 10 Gy  1. 3D, 6X 2. 3D, 6X  Narrative:  [name] presents today for her follow up appointment. Denies and issues with range of motion or swelling to the treatment area. Reports intermittent pain around the nipple area that feels like the nerve endings "waking back up". Denies any issues with skin irritation or peeling. On assessment skin appears smooth and uniform in color, with a well approximated incision. Patient reports that 60% of energy level is back and feels better, she reports taking a afternoon naps to help with fatigue. Patient does report coughing more frequently but not coughing anything out.  ALLERGIES:  is allergic to penicillins; latex; adhesive [tape]; codeine; metronidazole; and other.  Meds: Current Outpatient Medications  Medication Sig Dispense Refill  . acetaminophen (TYLENOL) 500 MG tablet Take 500 mg by mouth every 6 (six) hours as needed (for pain.).    Marland Kitchen Alcohol Swabs (B-D SINGLE USE SWABS REGULAR) PADS USE TWICE DAILY 200 each 2  . atorvastatin (LIPITOR) 80 MG tablet TAKE 1 TABLET EVERY DAY 90 tablet 0  . Biotin 10000 MCG TABS Take 10,000 mcg by mouth daily.    . Blood Glucose Monitoring Suppl ([place]) w/Device KIT 1 Device by Does not apply route daily. Use to check blood sugars twice a day Dx E11.9 1 kit 0  . busPIRone (BUSPAR) 15 MG tablet Take 15 mg by mouth  daily.     Marland Kitchen CALCIUM-VITAMIN D PO Take 1 tablet by mouth every evening.     . donepezil (ARICEPT) 10 MG tablet TAKE 1 TABLET AT BEDTIME 90 tablet 1  . ENSURE (ENSURE) Take 237 mLs by mouth 2 (two) times daily between meals. 42660 mL 3  . FLUoxetine (PROZAC) 20 MG capsule Take 20 mg by mouth daily.     Marland Kitchen glucose blood (ONETOUCH VERIO) test strip Use to check blood sugars twice a day Dx E11.9 200 each 3  . hyaluronate sodium (RADIAPLEXRX) GEL Apply 1 application topically 2 (two) times daily.    Marland Kitchen ketoconazole (NIZORAL) 2 % cream Apply 1 application topically daily. 60 g 5  . levothyroxine (SYNTHROID, LEVOTHROID) 112 MCG tablet Take 112-168 mcg by mouth daily before breakfast. Take 1 tablet (112 mcg) by mouth everyday, except take 1.5 tablet (168 mcg) on Tuesdays & Fridays.    Marland Kitchen lithium carbonate (ESKALITH) 450 MG CR tablet Take 450 mg by mouth at bedtime.     Marland Kitchen loratadine (CLARITIN) 10 MG tablet Take 10 mg by mouth daily.    Marland Kitchen losartan (COZAAR) 50 MG tablet TAKE 1 TABLET (50 MG TOTAL) BY MOUTH DAILY. (Patient taking differently: Take 50 mg by mouth every evening. ) 90 tablet 1  . Melatonin 5 MG CAPS Take 5-10 mg by mouth at bedtime.     . metFORMIN (GLUCOPHAGE-XR) 500  MG 24 hr tablet TAKE 1 TABLET EVERY DAY WITH BREAKFAST 90 tablet 3  . Multiple Vitamin (MULTIVITAMIN WITH MINERALS) TABS tablet Take 1 tablet by mouth daily.     . non-metallic deodorant Jethro Poling) MISC Apply 1 application topically daily as needed.    Marland Kitchen omeprazole (PRILOSEC) 40 MG capsule TAKE 1 CAPSULE EVERY DAY  BEFORE  BREAKFAST (Patient taking differently: TAKE 1 CAPSULE (40 MG) BY MOUTH ON SUNDAY, MONDAY, WEDNESDAY, & FRIDAYS (FOUR TIMES WEEKLY)) 90 capsule 3  . oxybutynin (DITROPAN) 5 MG tablet Take 1 tablet (5 mg total) by mouth 2 (two) times daily. 180 tablet 1  . Polyethyl Glycol-Propyl Glycol (SYSTANE ULTRA) 0.4-0.3 % SOLN Place 1-2 drops into both eyes 3 (three) times daily as needed (for dry/irritated eyes.).    Marland Kitchen PRADAXA 150  MG CAPS capsule TAKE 1 CAPSULE TWICE DAILY 180 capsule 1  . PRODIGY TWIST TOP LANCETS 28G MISC Use to check with blood sugars daily 100 each 1  . Propylene Glycol (SYSTANE COMPLETE) 0.6 % SOLN Apply to eye.    Marland Kitchen Pyridoxine HCl (VITAMIN B-6) 500 MG tablet Take 500 mg by mouth 2 (two) times daily.    . solifenacin (VESICARE) 5 MG tablet Take 1 tablet (5 mg total) by mouth daily. 90 tablet 1  . traMADol (ULTRAM) 50 MG tablet Take 1 tablet (50 mg total) by mouth every 6 (six) hours as needed for moderate pain. 15 tablet 0  . White Petrolatum-Mineral Oil (SYSTANE NIGHTTIME) OINT Apply to eye.    . Wound Dressings (SONAFINE EX) Apply topically.     No current facility-administered medications for this encounter.     Physical Findings: The patient is in no acute distress. Patient is alert and oriented.  weight is 107 lb 4 oz (48.6 kg). Her oral temperature is 97.6 F (36.4 C). Her blood pressure is 124/76 and her pulse is 59 (abnormal). Her respiration is 18 and oxygen saturation is 100%. .  No significant changes. Lungs are clear to auscultation bilaterally. Heart has regular rate and rhythm. No palpable cervical, supraclavicular, or axillary adenopathy. Abdomen soft, non-tender, normal bowel sounds. Patients right breast shows some mil hyperpigmentation changes and mild edema in the nipple areolar nipple complex, skin is well healed, no palpable or visual signs of reoccurrence. Patients left breast no palpable mass or nipple discharge.   Lab Findings: Lab Results  Component Value Date   WBC 8.1 10/03/2017   HGB 13.5 10/03/2017   HCT 41.3 10/03/2017   MCV 97.1 10/03/2017   PLT 206 10/03/2017    Radiographic Findings: No results found.  Impression:  The patient is recovering from the effects of radiation. No evidence of reoccurrence in physical exam. Patients fatigue still continues and sleeps to help with fatigue.  Plan:  Patient will return for a routine follow up in 3 months with  radiation oncology. Patient will see Dr. Harlow Asa in 6 months.  -----------------------------------  Blair Promise, PhD, MD  This document serves as a record of services personally performed by Gery Pray MD. It was created on his behalf by Delton Coombes, a trained medical scribe. The creation of this record is based on the scribe's personal observations and the provider's statements to them.

## 2017-12-20 NOTE — Progress Notes (Signed)
Stacy [name] presents today for her follow up appointment. Denies and issues with range of motion or swelling to the treatment area. Reports intermittent pain around the nipple area that feels like the nerve endings "waking back up". Denies any issues with skin irritation or peeling. On assessment skin appears smooth and uniform in color, with a well approximated incision.  Vitals:   12/20/17 1138  BP: 124/76  Pulse: (!) 59  Resp: 18  Temp: 97.6 F (36.4 C)  TempSrc: Oral  SpO2: 100%  Weight: 107 lb 4 oz (48.6 kg)   Wt Readings from Last 3 Encounters:  12/20/17 107 lb 4 oz (48.6 kg)  10/09/17 107 lb (48.5 kg)  10/08/17 105 lb 9.6 oz (47.9 kg)

## 2017-12-24 ENCOUNTER — Other Ambulatory Visit: Payer: Self-pay | Admitting: Internal Medicine

## 2017-12-25 NOTE — Telephone Encounter (Signed)
Pt is a 77 yr old female who last saw Dr Caryl Comes on 08/15/17. Last note weight was 48.5Kg on 10/09/17. Serum creatine on 09/06/17 was 1.02. CrCl 35 mL/min. Will refill Pradaxa 150mg  BID.

## 2017-12-28 DIAGNOSIS — F3181 Bipolar II disorder: Secondary | ICD-10-CM | POA: Diagnosis not present

## 2018-01-07 ENCOUNTER — Other Ambulatory Visit: Payer: Self-pay | Admitting: Internal Medicine

## 2018-01-07 ENCOUNTER — Ambulatory Visit: Payer: Medicare HMO | Admitting: Oncology
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
## 2018-01-09 ENCOUNTER — Encounter (INDEPENDENT_AMBULATORY_CARE_PROVIDER_SITE_OTHER): Payer: Self-pay | Admitting: "Endocrinology

## 2018-01-09 ENCOUNTER — Inpatient Hospital Stay: Payer: Medicare HMO | Attending: Oncology | Admitting: Oncology

## 2018-01-09 ENCOUNTER — Ambulatory Visit (INDEPENDENT_AMBULATORY_CARE_PROVIDER_SITE_OTHER): Payer: Medicare HMO | Admitting: "Endocrinology

## 2018-01-09 VITALS — BP 127/45 | HR 62 | Temp 98.0°F | Resp 18 | Ht 64.57 in

## 2018-01-09 VITALS — BP 110/68 | HR 68 | Ht 64.57 in | Wt 106.4 lb

## 2018-01-09 DIAGNOSIS — H9190 Unspecified hearing loss, unspecified ear: Secondary | ICD-10-CM | POA: Insufficient documentation

## 2018-01-09 DIAGNOSIS — J449 Chronic obstructive pulmonary disease, unspecified: Secondary | ICD-10-CM | POA: Insufficient documentation

## 2018-01-09 DIAGNOSIS — N183 Chronic kidney disease, stage 3 unspecified: Secondary | ICD-10-CM

## 2018-01-09 DIAGNOSIS — E119 Type 2 diabetes mellitus without complications: Secondary | ICD-10-CM | POA: Diagnosis not present

## 2018-01-09 DIAGNOSIS — E785 Hyperlipidemia, unspecified: Secondary | ICD-10-CM | POA: Insufficient documentation

## 2018-01-09 DIAGNOSIS — F03A0 Unspecified dementia, mild, without behavioral disturbance, psychotic disturbance, mood disturbance, and anxiety: Secondary | ICD-10-CM | POA: Insufficient documentation

## 2018-01-09 DIAGNOSIS — Z7984 Long term (current) use of oral hypoglycemic drugs: Secondary | ICD-10-CM | POA: Diagnosis not present

## 2018-01-09 DIAGNOSIS — Z720 Tobacco use: Secondary | ICD-10-CM | POA: Diagnosis not present

## 2018-01-09 DIAGNOSIS — E063 Autoimmune thyroiditis: Secondary | ICD-10-CM | POA: Diagnosis not present

## 2018-01-09 DIAGNOSIS — E1122 Type 2 diabetes mellitus with diabetic chronic kidney disease: Secondary | ICD-10-CM | POA: Diagnosis not present

## 2018-01-09 DIAGNOSIS — E049 Nontoxic goiter, unspecified: Secondary | ICD-10-CM

## 2018-01-09 DIAGNOSIS — I2510 Atherosclerotic heart disease of native coronary artery without angina pectoris: Secondary | ICD-10-CM | POA: Insufficient documentation

## 2018-01-09 DIAGNOSIS — F17210 Nicotine dependence, cigarettes, uncomplicated: Secondary | ICD-10-CM | POA: Insufficient documentation

## 2018-01-09 DIAGNOSIS — Z923 Personal history of irradiation: Secondary | ICD-10-CM

## 2018-01-09 DIAGNOSIS — E559 Vitamin D deficiency, unspecified: Secondary | ICD-10-CM | POA: Insufficient documentation

## 2018-01-09 DIAGNOSIS — I4891 Unspecified atrial fibrillation: Secondary | ICD-10-CM | POA: Insufficient documentation

## 2018-01-09 DIAGNOSIS — Z79899 Other long term (current) drug therapy: Secondary | ICD-10-CM | POA: Insufficient documentation

## 2018-01-09 DIAGNOSIS — D0511 Intraductal carcinoma in situ of right breast: Secondary | ICD-10-CM | POA: Diagnosis not present

## 2018-01-09 DIAGNOSIS — K219 Gastro-esophageal reflux disease without esophagitis: Secondary | ICD-10-CM | POA: Insufficient documentation

## 2018-01-09 DIAGNOSIS — H9193 Unspecified hearing loss, bilateral: Secondary | ICD-10-CM

## 2018-01-09 DIAGNOSIS — F0390 Unspecified dementia without behavioral disturbance: Secondary | ICD-10-CM | POA: Insufficient documentation

## 2018-01-09 DIAGNOSIS — I10 Essential (primary) hypertension: Secondary | ICD-10-CM | POA: Diagnosis not present

## 2018-01-09 DIAGNOSIS — F319 Bipolar disorder, unspecified: Secondary | ICD-10-CM | POA: Diagnosis not present

## 2018-01-09 DIAGNOSIS — R413 Other amnesia: Secondary | ICD-10-CM

## 2018-01-09 DIAGNOSIS — Z7901 Long term (current) use of anticoagulants: Secondary | ICD-10-CM | POA: Diagnosis not present

## 2018-01-09 DIAGNOSIS — Z8673 Personal history of transient ischemic attack (TIA), and cerebral infarction without residual deficits: Secondary | ICD-10-CM | POA: Diagnosis not present

## 2018-01-09 DIAGNOSIS — R634 Abnormal weight loss: Secondary | ICD-10-CM

## 2018-01-09 DIAGNOSIS — E211 Secondary hyperparathyroidism, not elsewhere classified: Secondary | ICD-10-CM | POA: Diagnosis not present

## 2018-01-09 DIAGNOSIS — Z171 Estrogen receptor negative status [ER-]: Secondary | ICD-10-CM | POA: Diagnosis not present

## 2018-01-09 LAB — POCT GLYCOSYLATED HEMOGLOBIN (HGB A1C): Hemoglobin A1C: 5.7 % — AB (ref 4.0–5.6)

## 2018-01-09 LAB — POCT GLUCOSE (DEVICE FOR HOME USE): POC Glucose: 111 mg/dl — AB (ref 70–99)
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
## 2018-01-09 NOTE — Patient Instructions (Signed)
Follow up visit in 4 months.  

## 2018-01-10 ENCOUNTER — Telehealth: Payer: Self-pay | Admitting: Oncology

## 2018-01-10 LAB — T3, FREE: T3, Free: 2.5 pg/mL (ref 2.3–4.2)

## 2018-01-10 LAB — TSH: TSH: 3.27 m[IU]/L (ref 0.40–4.50)

## 2018-01-10 LAB — T4, FREE: Free T4: 1.1 ng/dL (ref 0.8–1.8)

## 2018-01-10 NOTE — Telephone Encounter (Signed)
Per 6/5 no los °

## 2018-01-21 ENCOUNTER — Other Ambulatory Visit: Payer: Self-pay | Admitting: Internal Medicine

## 2018-01-23 ENCOUNTER — Encounter (INDEPENDENT_AMBULATORY_CARE_PROVIDER_SITE_OTHER): Payer: Self-pay | Admitting: *Deleted

## 2018-01-23 ENCOUNTER — Other Ambulatory Visit: Payer: Self-pay | Admitting: Surgery

## 2018-01-23 ENCOUNTER — Other Ambulatory Visit (INDEPENDENT_AMBULATORY_CARE_PROVIDER_SITE_OTHER): Payer: Self-pay | Admitting: *Deleted

## 2018-01-23 DIAGNOSIS — E063 Autoimmune thyroiditis: Secondary | ICD-10-CM

## 2018-01-23 DIAGNOSIS — D0511 Intraductal carcinoma in situ of right breast: Secondary | ICD-10-CM

## 2018-01-28 ENCOUNTER — Other Ambulatory Visit: Payer: Self-pay | Admitting: Internal Medicine

## 2018-02-08 ENCOUNTER — Ambulatory Visit (INDEPENDENT_AMBULATORY_CARE_PROVIDER_SITE_OTHER): Payer: Medicare HMO | Admitting: *Deleted

## 2018-02-08 DIAGNOSIS — I442 Atrioventricular block, complete: Secondary | ICD-10-CM | POA: Diagnosis not present

## 2018-02-08 NOTE — Progress Notes (Signed)
Remote pacemaker transmission.   

## 2018-02-11 ENCOUNTER — Other Ambulatory Visit (INDEPENDENT_AMBULATORY_CARE_PROVIDER_SITE_OTHER): Payer: Self-pay | Admitting: "Endocrinology

## 2018-02-13 DIAGNOSIS — E063 Autoimmune thyroiditis: Secondary | ICD-10-CM | POA: Diagnosis not present

## 2018-02-13 DIAGNOSIS — F3181 Bipolar II disorder: Secondary | ICD-10-CM | POA: Diagnosis not present

## 2018-02-13 LAB — BASIC METABOLIC PANEL
BUN: 16 (ref 4–21)
CREATININE: 1.1 (ref 0.5–1.1)
Creatinine: 1.1 (ref 0.5–1.1)
Creatinine: 1.1 (ref 0.5–1.1)
Glucose: 131
Potassium: 4 (ref 3.4–5.3)
SODIUM: 138 (ref 137–147)

## 2018-02-14 LAB — HEMOGLOBIN A1C
EAG (MMOL/L): 5.8 (calc)
Hgb A1c MFr Bld: 5.3 % of total Hgb (ref <5.7)
Mean Plasma Glucose: 105 (calc)

## 2018-02-14 LAB — T4, FREE: FREE T4: 1.2 ng/dL (ref 0.8–1.8)

## 2018-02-14 LAB — T3, FREE: T3 FREE: 2.8 pg/mL (ref 2.3–4.2)

## 2018-02-14 LAB — TSH: TSH: 2.34 mIU/L (ref 0.40–4.50)

## 2018-02-19 ENCOUNTER — Ambulatory Visit: Payer: Medicare HMO | Admitting: Internal Medicine

## 2018-02-19 ENCOUNTER — Ambulatory Visit: Payer: Self-pay | Admitting: *Deleted

## 2018-02-19 NOTE — Telephone Encounter (Signed)
Pt calling with complaints of a burning sensation to the right side of nose, sore throat and nasal drainage that runs down the throat. Pt states she has not been around anyone that has been sick recently but she does have a history of allergies. Pt denies any fever but states she has been sneezing and coughing a little bit. Pt states her nasal drainage is white in color. Pt's daughter had previously scheduled appt for Thursday. Pt given home care advice to help with symptoms until seen for appt on Thursday and advised to return call to the office if symptoms became worse or if fever developed. Understanding verbalized.  Reason for Disposition . [1] Sinus congestion as part of a cold AND [2] present < 10 days  Answer Assessment - Initial Assessment Questions 1. LOCATION: "Where does it hurt?"      Right side of nostril 2. ONSET: "When did the sinus pain start?"  (e.g., hours, days)      yesterday 3. SEVERITY: "How bad is the pain?"   (Scale 1-10; mild, moderate or severe)   - MILD (1-3): doesn't interfere with normal activities    - MODERATE (4-7): interferes with normal activities (e.g., work or school) or awakens from sleep   - SEVERE (8-10): excruciating pain and patient unable to do any normal activities        5 that is constant 4. RECURRENT SYMPTOM: "Have you ever had sinus problems before?" If so, ask: "When was the last time?" and "What happened that time?"      Can't remember 5. NASAL CONGESTION: "Is the nose blocked?" If so, ask, "Can you open it or must you breathe through the mouth?"     Yes nose is blocked and must breathe through her mouth 6. NASAL DISCHARGE: "Do you have discharge from your nose?" If so ask, "What color?"     White colored drainage from nose that goes down the throat 7. FEVER: "Do you have a fever?" If so, ask: "What is it, how was it measured, and when did it start?"      No 8. OTHER SYMPTOMS: "Do you have any other symptoms?" (e.g., sore throat, cough, earache,  difficulty breathing)     Sore throat but not bad  Protocols used: SINUS PAIN OR CONGESTION-A-AH

## 2018-02-21 ENCOUNTER — Encounter: Payer: Self-pay | Admitting: Internal Medicine

## 2018-02-21 ENCOUNTER — Encounter (INDEPENDENT_AMBULATORY_CARE_PROVIDER_SITE_OTHER): Payer: Self-pay | Admitting: *Deleted

## 2018-02-21 ENCOUNTER — Ambulatory Visit (INDEPENDENT_AMBULATORY_CARE_PROVIDER_SITE_OTHER): Payer: Medicare HMO | Admitting: Internal Medicine

## 2018-02-21 VITALS — BP 100/60 | HR 60 | Temp 98.0°F | Ht 64.57 in | Wt 106.0 lb

## 2018-02-21 DIAGNOSIS — J3089 Other allergic rhinitis: Secondary | ICD-10-CM

## 2018-02-21 DIAGNOSIS — H9190 Unspecified hearing loss, unspecified ear: Secondary | ICD-10-CM | POA: Diagnosis not present

## 2018-02-21 MED ORDER — MONTELUKAST SODIUM 10 MG PO TABS: 10.0000 mg | ORAL_TABLET | Freq: Every day | ORAL | 3 refills | Status: DC

## 2018-02-21 NOTE — Assessment & Plan Note (Signed)
Referral to ENT °

## 2018-02-21 NOTE — Progress Notes (Signed)
Abstracted and sent to scan  

## 2018-02-21 NOTE — Patient Instructions (Signed)
We have sent in singulair to take 1 pill daily to help with the drainage.

## 2018-02-21 NOTE — Assessment & Plan Note (Signed)
Rx for singulair to take daily for symptoms. Continue claritin. She declines nose spray today. No indication for antibiotics or steroids today.

## 2018-02-21 NOTE — Progress Notes (Signed)
   Subjective:    Patient ID: Stacy [name], female    DOB: 05-Mar-1941, 77 y.o.   MRN: [id number]  HPI The patient is a 77 YO female coming in for hearing loss and nasal drip. She is accompanied by her daughter who helps to provide history. This has been bothering her about 1-2 days. Dripping in her nose. Some pressure in her ears. Denies fevers or chills. Denies cough or SOB. Denies sinus pressure. Denies sore throat. Overall stable since onset. She is taking claritin daily.   Review of Systems  Constitutional: Negative for activity change, appetite change, chills, fatigue, fever and unexpected weight change.  HENT: Positive for congestion, postnasal drip, rhinorrhea and sinus pressure. Negative for ear discharge, ear pain, sinus pain, sneezing, sore throat, tinnitus, trouble swallowing and voice change.        Ear pressure  Eyes: Negative.   Respiratory: Negative for cough, chest tightness, shortness of breath and wheezing.   Cardiovascular: Negative.   Gastrointestinal: Negative.   Musculoskeletal: Negative.   Neurological: Negative.       Objective:   Physical Exam  Constitutional: She is oriented to person, place, and time. She appears well-developed and well-nourished.  HENT:  Head: Normocephalic and atraumatic.  Oropharynx with redness and clear drainage, nose with swollen turbinates, TMs normal bilaterally  Eyes: EOM are normal.  Neck: Normal range of motion. No thyromegaly present.  Cardiovascular: Normal rate and regular rhythm.  Pulmonary/Chest: Effort normal and breath sounds normal. No respiratory distress. She has no wheezes. She has no rales.  Abdominal: Soft.  Musculoskeletal: She exhibits tenderness.  Lymphadenopathy:    She has no cervical adenopathy.  Neurological: She is alert and oriented to person, place, and time.  Skin: Skin is warm and dry.   Vitals:   02/21/18 0811  BP: 100/60  Pulse: 60  Temp: 98 F (36.7 C)  TempSrc: Oral  SpO2: 97%  Weight:  106 lb (48.1 kg)  Height: 5' 4.57" (1.64 m)      Assessment & Plan:

## 2018-02-22 DIAGNOSIS — F3181 Bipolar II disorder: Secondary | ICD-10-CM | POA: Diagnosis not present

## 2018-02-26 ENCOUNTER — Encounter: Payer: Self-pay | Admitting: Internal Medicine

## 2018-02-28 LAB — CUP PACEART REMOTE DEVICE CHECK
Battery Impedance: 158 Ohm
Battery Voltage: 2.8 V
Brady Statistic AP VS Percent: 38 %
Brady Statistic AS VP Percent: 0 %
Implantable Lead Implant Date: 20010521
Implantable Lead Model: 4592
Implantable Lead Model: 6940
Lead Channel Impedance Value: 1169 Ohm
Lead Channel Impedance Value: 640 Ohm
Lead Channel Pacing Threshold Amplitude: 0.625 V
Lead Channel Pacing Threshold Amplitude: 1.375 V
Lead Channel Pacing Threshold Pulse Width: 0.4 ms
Lead Channel Pacing Threshold Pulse Width: 0.4 ms
Lead Channel Sensing Intrinsic Amplitude: 1.4 mV
Lead Channel Setting Sensing Sensitivity: 4 mV
MDC IDC LEAD IMPLANT DT: 20010521
MDC IDC LEAD LOCATION: 753859
MDC IDC LEAD LOCATION: 753860
MDC IDC MSMT BATTERY REMAINING LONGEVITY: 151 mo
MDC IDC MSMT LEADCHNL RV SENSING INTR AMPL: 11.2 mV
MDC IDC PG IMPLANT DT: 20161229
MDC IDC SESS DTM: 20190705121806
MDC IDC SET LEADCHNL RA PACING AMPLITUDE: 2 V
MDC IDC SET LEADCHNL RV PACING AMPLITUDE: 2.75 V
MDC IDC SET LEADCHNL RV PACING PULSEWIDTH: 0.4 ms
MDC IDC STAT BRADY AP VP PERCENT: 8 %
MDC IDC STAT BRADY AS VS PERCENT: 54 %
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
## 2018-03-07 ENCOUNTER — Encounter: Payer: Self-pay | Admitting: Psychology

## 2018-03-07 ENCOUNTER — Encounter: Payer: Self-pay | Admitting: Internal Medicine

## 2018-03-07 ENCOUNTER — Ambulatory Visit (INDEPENDENT_AMBULATORY_CARE_PROVIDER_SITE_OTHER): Payer: Medicare HMO | Admitting: Internal Medicine

## 2018-03-07 ENCOUNTER — Ambulatory Visit: Payer: Medicare HMO | Admitting: Psychology

## 2018-03-07 DIAGNOSIS — E782 Mixed hyperlipidemia: Secondary | ICD-10-CM

## 2018-03-07 DIAGNOSIS — E119 Type 2 diabetes mellitus without complications: Secondary | ICD-10-CM | POA: Diagnosis not present

## 2018-03-07 DIAGNOSIS — I10 Essential (primary) hypertension: Secondary | ICD-10-CM | POA: Diagnosis not present

## 2018-03-07 DIAGNOSIS — N3281 Overactive bladder: Secondary | ICD-10-CM | POA: Diagnosis not present

## 2018-03-07 DIAGNOSIS — R5381 Other malaise: Secondary | ICD-10-CM | POA: Diagnosis not present

## 2018-03-07 DIAGNOSIS — S060X1S Concussion with loss of consciousness of 30 minutes or less, sequela: Secondary | ICD-10-CM

## 2018-03-07 DIAGNOSIS — R413 Other amnesia: Secondary | ICD-10-CM

## 2018-03-07 DIAGNOSIS — K219 Gastro-esophageal reflux disease without esophagitis: Secondary | ICD-10-CM | POA: Diagnosis not present

## 2018-03-07 DIAGNOSIS — E063 Autoimmune thyroiditis: Secondary | ICD-10-CM | POA: Diagnosis not present

## 2018-03-07 DIAGNOSIS — F3170 Bipolar disorder, currently in remission, most recent episode unspecified: Secondary | ICD-10-CM

## 2018-03-07 NOTE — Assessment & Plan Note (Signed)
Follows with endocrine-management per her Sugars well controlled
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
## 2018-03-07 NOTE — Assessment & Plan Note (Signed)
BP well controlled Current regimen effective and well tolerated Continue current medications at current doses  

## 2018-03-07 NOTE — Assessment & Plan Note (Signed)
Lipids have been well controlled Continue statin Encouraged increasing her exercise

## 2018-03-07 NOTE — Patient Instructions (Addendum)
  Medications reviewed and updated.  No changes recommended at this time.    Please followup in December - same day as wellness visit

## 2018-03-07 NOTE — Assessment & Plan Note (Signed)
Following with endocrine-management per them TSH checked recently-within normal range

## 2018-03-07 NOTE — Assessment & Plan Note (Signed)
GERD controlled Continue daily medication  

## 2018-03-07 NOTE — Assessment & Plan Note (Signed)
Exercising minimally-encouraged her to continue physical therapy exercises on a daily basis Increasing walking Deferred PT-states she cannot afford it

## 2018-03-07 NOTE — Assessment & Plan Note (Signed)
Controlled Continue oxybutynin

## 2018-03-08 ENCOUNTER — Other Ambulatory Visit: Payer: Self-pay | Admitting: Surgery

## 2018-03-08 ENCOUNTER — Other Ambulatory Visit: Payer: Self-pay

## 2018-03-08 ENCOUNTER — Encounter: Payer: Self-pay | Admitting: Internal Medicine

## 2018-03-08 DIAGNOSIS — D0511 Intraductal carcinoma in situ of right breast: Secondary | ICD-10-CM

## 2018-03-11 ENCOUNTER — Ambulatory Visit
Admission: RE | Admit: 2018-03-11 | Discharge: 2018-03-11 | Disposition: A | Discharge status: Home / Self Care | Payer: Medicare HMO | Source: Ambulatory Visit | Attending: Surgery | Admitting: Surgery

## 2018-03-11 ENCOUNTER — Other Ambulatory Visit: Payer: Self-pay | Admitting: Internal Medicine

## 2018-03-11 DIAGNOSIS — D0511 Intraductal carcinoma in situ of right breast: Secondary | ICD-10-CM

## 2018-03-11 DIAGNOSIS — Z853 Personal history of malignant neoplasm of breast: Secondary | ICD-10-CM

## 2018-03-11 DIAGNOSIS — R921 Mammographic calcification found on diagnostic imaging of breast: Secondary | ICD-10-CM | POA: Diagnosis not present

## 2018-03-11 HISTORY — DX: Personal history of irradiation: Z92.3

## 2018-03-13 ENCOUNTER — Ambulatory Visit: Payer: Medicare HMO | Admitting: Psychology

## 2018-03-13 ENCOUNTER — Encounter: Payer: Self-pay | Admitting: Psychology

## 2018-03-13 DIAGNOSIS — R413 Other amnesia: Secondary | ICD-10-CM

## 2018-03-13 NOTE — Progress Notes (Signed)
   Neuropsychology Note  [name] completed 60 minutes of neuropsychological testing with technician, Milana Kidney, BS, under the supervision of Dr. Macarthur Critchley, Licensed Psychologist. The patient did not appear overtly distressed by the testing session, per behavioral observation or via self-report to the technician. Rest breaks were offered.   Clinical Decision Making: In considering the patient's current level of functioning, level of presumed impairment, nature of symptoms, emotional and behavioral responses during the interview, level of literacy, and observed level of motivation/effort, a battery of tests was selected and communicated to the psychometrician.  Communication between the psychologist and technician was ongoing throughout the testing session and changes were made as deemed necessary based on patient performance on testing, technician observations and additional pertinent factors such as those listed above.  Stacy [name] will return within approximately 2 weeks for an interactive feedback session with Dr. Si Raider at which time her test performances, clinical impressions and treatment recommendations will be reviewed in detail. The patient understands she can contact our office should she require our assistance before this time.  35 minutes spent performing neuropsychological evaluation services/clinical decision making (psychologist). [CPT 27035] 60 minutes spent face-to-face with patient administering standardized tests, 30 minutes spent scoring (technician). [CPT [id number], 00938]  Full report to follow.

## 2018-03-20 DIAGNOSIS — H9113 Presbycusis, bilateral: Secondary | ICD-10-CM | POA: Diagnosis not present

## 2018-03-25 ENCOUNTER — Other Ambulatory Visit: Payer: Self-pay | Admitting: Internal Medicine

## 2018-03-28 ENCOUNTER — Other Ambulatory Visit: Payer: Self-pay

## 2018-03-28 ENCOUNTER — Ambulatory Visit
Admission: RE | Admit: 2018-03-28 | Discharge: 2018-03-28 | Disposition: A | Discharge status: Home / Self Care | Payer: Medicare HMO | Source: Ambulatory Visit | Attending: Radiation Oncology | Admitting: Radiation Oncology

## 2018-03-28 ENCOUNTER — Encounter: Payer: Self-pay | Admitting: Radiation Oncology

## 2018-03-28 VITALS — BP 124/73 | HR 58 | Temp 97.6°F | Wt 104.6 lb

## 2018-03-28 DIAGNOSIS — Z880 Allergy status to penicillin: Secondary | ICD-10-CM | POA: Insufficient documentation

## 2018-03-28 DIAGNOSIS — Z885 Allergy status to narcotic agent status: Secondary | ICD-10-CM | POA: Diagnosis not present

## 2018-03-28 DIAGNOSIS — Z79899 Other long term (current) drug therapy: Secondary | ICD-10-CM | POA: Insufficient documentation

## 2018-03-28 DIAGNOSIS — Z7901 Long term (current) use of anticoagulants: Secondary | ICD-10-CM | POA: Diagnosis not present

## 2018-03-28 DIAGNOSIS — Z7984 Long term (current) use of oral hypoglycemic drugs: Secondary | ICD-10-CM | POA: Insufficient documentation

## 2018-03-28 DIAGNOSIS — Z888 Allergy status to other drugs, medicaments and biological substances status: Secondary | ICD-10-CM | POA: Diagnosis not present

## 2018-03-28 DIAGNOSIS — Z08 Encounter for follow-up examination after completed treatment for malignant neoplasm: Secondary | ICD-10-CM | POA: Diagnosis not present

## 2018-03-28 DIAGNOSIS — D0511 Intraductal carcinoma in situ of right breast: Secondary | ICD-10-CM | POA: Insufficient documentation

## 2018-03-28 DIAGNOSIS — Z881 Allergy status to other antibiotic agents status: Secondary | ICD-10-CM | POA: Diagnosis not present

## 2018-03-28 DIAGNOSIS — F319 Bipolar disorder, unspecified: Secondary | ICD-10-CM | POA: Diagnosis not present

## 2018-03-28 DIAGNOSIS — G8918 Other acute postprocedural pain: Secondary | ICD-10-CM | POA: Diagnosis not present

## 2018-03-28 NOTE — Progress Notes (Signed)
Radiation Oncology         [phone] ________________________________  Name: Stacy [name] MRN: [id number]  Date: 03/28/2018  DOB: Oct 05, 1940  Follow-Up Visit Note  CC: Binnie Rail, MD  Armandina Gemma, MD    ICD-10-CM   1. Ductal carcinoma in situ (DCIS) of right breast D05.11     Diagnosis: High-grade, DCIS of right breast, ER/PR (neg) Pathological stage:pTis, pNX     Interval Since Last Radiation:  4 months 10/24/2017 - 11/20/2017  1. Right breast, 2.67 Gy in 15 fractions for a total dose of 40.05 Gy  2. Boost, 2 Gy in 5 fractions, for a total dose of 10 Gy  Narrative:  Stacy [name] presents today for her follow up appointment. She is accompanied by her daughter today. She notes being unable to drive, and that her daughter drives her around.  She reports an occasional quick, sharp pain to the incision area. She also reports moderate fatigue. She denies swelling to the arm and any pain.  ALLERGIES:  is allergic to penicillins; latex; adhesive [tape]; codeine; metronidazole; and other.  Meds: Current Outpatient Medications  Medication Sig Dispense Refill  . ACCU-CHEK SOFTCLIX LANCETS lancets     . acetaminophen (TYLENOL) 500 MG tablet Take 500 mg by mouth every 6 (six) hours as needed (for pain.).    Marland Kitchen Alcohol Swabs (B-D SINGLE USE SWABS REGULAR) PADS USE TWICE DAILY 200 each 2  . atorvastatin (LIPITOR) 80 MG tablet TAKE 1 TABLET EVERY DAY 90 tablet 0  . Biotin 10000 MCG TABS Take 10,000 mcg by mouth daily.    . Blood Glucose Monitoring Suppl ([place]) w/Device KIT 1 Device by Does not apply route daily. Use to check blood sugars twice a day Dx E11.9 1 kit 0  . busPIRone (BUSPAR) 15 MG tablet Take 15 mg by mouth daily.     Marland Kitchen CALCIUM CITRATE-VITAMIN D PO calcium citrate vitamin d    . CALCIUM-VITAMIN D PO Take 1 tablet by mouth every evening.     . donepezil (ARICEPT) 10 MG tablet TAKE 1 TABLET AT BEDTIME 90 tablet 1  . ENSURE (ENSURE) Take 237  mLs by mouth 2 (two) times daily between meals. 42660 mL 3  . FLUoxetine (PROZAC) 20 MG tablet fluoxetine 20 mg capsule    . glucose blood (ONETOUCH VERIO) test strip Use to check blood sugars twice a day Dx E11.9 200 each 3  . hyaluronate sodium (RADIAPLEXRX) GEL Apply 1 application topically 2 (two) times daily.    Marland Kitchen ketoconazole (NIZORAL) 2 % cream Apply 1 application topically daily. 60 g 5  . lithium carbonate (LITHOBID) 300 MG CR tablet Take 1 tablet by mouth at bedtime.  0  . loratadine (CLARITIN) 10 MG tablet Take 10 mg by mouth daily.    Marland Kitchen losartan (COZAAR) 50 MG tablet TAKE 1 TABLET EVERY DAY 90 tablet 1  . Melatonin 5 MG TABS melatonin 5 mg    . metFORMIN (GLUCOPHAGE-XR) 500 MG 24 hr tablet TAKE 1 TABLET EVERY DAY WITH BREAKFAST 90 tablet 1  . montelukast (SINGULAIR) 10 MG tablet Take 1 tablet (10 mg total) by mouth at bedtime. 30 tablet 3  . Multiple Vitamin (MULTIVITAMIN WITH MINERALS) TABS tablet Take 1 tablet by mouth daily.     . non-metallic deodorant Jethro Poling) MISC Apply 1 application topically daily as needed.    Marland Kitchen omeprazole (PRILOSEC) 40 MG capsule TAKE 1 CAPSULE EVERY DAY  BEFORE  BREAKFAST (Patient taking differently: TAKE  1 CAPSULE (40 MG) BY MOUTH ON SUNDAY, MONDAY, WEDNESDAY, & FRIDAYS (FOUR TIMES WEEKLY)) 90 capsule 3  . oxybutynin (DITROPAN) 5 MG tablet Take 1 tablet (5 mg total) by mouth 2 (two) times daily. 180 tablet 1  . Polyethyl Glycol-Propyl Glycol (SYSTANE ULTRA) 0.4-0.3 % SOLN Place 1-2 drops into both eyes 3 (three) times daily as needed (for dry/irritated eyes.).    Marland Kitchen PRADAXA 150 MG CAPS capsule TAKE 1 CAPSULE TWICE DAILY 180 capsule 2  . Propylene Glycol (SYSTANE COMPLETE) 0.6 % SOLN Apply to eye.    Marland Kitchen Pyridoxine HCl (VITAMIN B-6) 500 MG tablet Take 500 mg by mouth 2 (two) times daily.    Marland Kitchen SYNTHROID 112 MCG tablet TAKE 1 TABLET EVERY DAY 90 tablet 3  . traMADol (ULTRAM) 50 MG tablet Take 1 tablet (50 mg total) by mouth every 6 (six) hours as needed for  moderate pain. 15 tablet 0  . White Petrolatum-Mineral Oil (SYSTANE NIGHTTIME) OINT Apply to eye.    . Wound Dressings (SONAFINE EX) Apply topically.     No current facility-administered medications for this encounter.     Physical Findings: The patient is in no acute distress. Patient is alert and oriented.  weight is 104 lb 9.6 oz (47.4 kg). Her oral temperature is 97.6 F (36.4 C). Her blood pressure is 124/73 and her pulse is 58 (abnormal). Her oxygen saturation is 100%. .  No significant changes. Lungs are clear to auscultation bilaterally. Heart has regular rate and rhythm. No palpable cervical, supraclavicular, or axillary adenopathy. Abdomen soft, non-tender, normal bowel sounds.  Left breast: no palpable mass, nipple discharge or bleeding.  Right breast: lumpectomy scar noted, mild hyperpigmentation changes, no palpable mass, nipple discharge, or bleeding.   Lab Findings: Lab Results  Component Value Date   WBC 8.1 10/03/2017   HGB 13.5 10/03/2017   HCT 41.3 10/03/2017   MCV 97.1 10/03/2017   PLT 206 10/03/2017    Radiographic Findings: Mm Diag Breast Tomo Uni Right  Result Date: 03/11/2018 CLINICAL DATA:  History of right lumpectomy in February of 2019 status post lumpectomy. Follow-up study. EXAM: DIGITAL DIAGNOSTIC UNILATERAL RIGHT MAMMOGRAM WITH CAD AND TOMO COMPARISON:  Previous exam(s). ACR Breast Density Category c: The breast tissue is heterogeneously dense, which may obscure small masses. FINDINGS: Lumpectomy changes are seen in the right breast. Diffuse punctate calcifications are seen near the lumpectomy site and felt to likely be benign. No grouped or linear type calcifications identified. There is no mass. Mammographic images were processed with CAD. IMPRESSION: Probable benign calcifications in the right breast. RECOMMENDATION: Bilateral diagnostic mammogram in November of 2019 is recommended. I have discussed the findings and recommendations with the patient.  Results were also provided in writing at the conclusion of the visit. If applicable, a reminder letter will be sent to the patient regarding the next appointment. BI-RADS CATEGORY  3: Probably benign. Electronically Signed   By: Lillia Mountain M.D.   On: 03/11/2018 10:14    Impression:  Right Breast DCIS, High Grade  No evidence of reoccurrence on physical exam. She will have repeat imaging in December.  Plan:  Patient will return for a routine follow up in January with radiation oncology after her imaging is complete.   -----------------------------------  Blair Promise, PhD, MD  This document serves as a record of services personally performed by Gery Pray, MD. It was created on his behalf by Wilburn Mylar, a trained medical scribe. The creation of this record is based  on the scribe's personal observations and the provider's statements to them. This document has been checked and approved by the attending provider.

## 2018-03-28 NOTE — Progress Notes (Signed)
Pt here today for a follow-up of right breast cancer. Pt denies having any pain. Pt states moderate fatigue. Pt takes naps to help. Pt states that her skin looks good after the radiation treatment. Pt states that she is using the radiaplex gel 2 times per week.  BP 124/73   Pulse (!) 58   Temp 97.6 F (36.4 C) (Oral)   Wt 104 lb 9.6 oz (47.4 kg)   SpO2 100%   BMI 17.64 kg/m    Wt Readings from Last 3 Encounters:  03/28/18 104 lb 9.6 oz (47.4 kg)  03/07/18 106 lb (48.1 kg)  02/21/18 106 lb (48.1 kg)

## 2018-04-01 ENCOUNTER — Other Ambulatory Visit: Payer: Self-pay | Admitting: Internal Medicine
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
## 2018-04-04 ENCOUNTER — Encounter: Payer: Self-pay | Admitting: Psychology

## 2018-04-04 ENCOUNTER — Ambulatory Visit: Payer: Medicare HMO | Admitting: Psychology

## 2018-04-04 DIAGNOSIS — F0390 Unspecified dementia without behavioral disturbance: Secondary | ICD-10-CM | POA: Diagnosis not present

## 2018-04-04 DIAGNOSIS — F03A0 Unspecified dementia, mild, without behavioral disturbance, psychotic disturbance, mood disturbance, and anxiety: Secondary | ICD-10-CM

## 2018-04-04 DIAGNOSIS — F3181 Bipolar II disorder: Secondary | ICD-10-CM | POA: Diagnosis not present

## 2018-05-06 ENCOUNTER — Other Ambulatory Visit: Payer: Self-pay

## 2018-05-06 MED ORDER — GLUCOSE BLOOD VI STRP: ORAL_STRIP | 5 refills | Status: DC

## 2018-05-13 ENCOUNTER — Ambulatory Visit (INDEPENDENT_AMBULATORY_CARE_PROVIDER_SITE_OTHER): Payer: Medicare HMO | Admitting: *Deleted

## 2018-05-13 DIAGNOSIS — I442 Atrioventricular block, complete: Secondary | ICD-10-CM | POA: Diagnosis not present

## 2018-05-13 NOTE — Progress Notes (Signed)
Remote pacemaker transmission.   

## 2018-05-21 ENCOUNTER — Encounter (INDEPENDENT_AMBULATORY_CARE_PROVIDER_SITE_OTHER): Payer: Self-pay | Admitting: "Endocrinology

## 2018-05-21 ENCOUNTER — Ambulatory Visit (INDEPENDENT_AMBULATORY_CARE_PROVIDER_SITE_OTHER): Payer: Medicare HMO | Admitting: "Endocrinology

## 2018-05-21 VITALS — BP 132/76 | HR 66 | Wt 105.0 lb

## 2018-05-21 DIAGNOSIS — E559 Vitamin D deficiency, unspecified: Secondary | ICD-10-CM | POA: Diagnosis not present

## 2018-05-21 DIAGNOSIS — Z029 Encounter for administrative examinations, unspecified: Secondary | ICD-10-CM

## 2018-05-21 DIAGNOSIS — E049 Nontoxic goiter, unspecified: Secondary | ICD-10-CM | POA: Diagnosis not present

## 2018-05-21 DIAGNOSIS — E119 Type 2 diabetes mellitus without complications: Secondary | ICD-10-CM | POA: Diagnosis not present

## 2018-05-21 DIAGNOSIS — R634 Abnormal weight loss: Secondary | ICD-10-CM

## 2018-05-21 DIAGNOSIS — R5383 Other fatigue: Secondary | ICD-10-CM

## 2018-05-21 DIAGNOSIS — E063 Autoimmune thyroiditis: Secondary | ICD-10-CM

## 2018-05-21 DIAGNOSIS — E211 Secondary hyperparathyroidism, not elsewhere classified: Secondary | ICD-10-CM

## 2018-05-21 DIAGNOSIS — I10 Essential (primary) hypertension: Secondary | ICD-10-CM | POA: Diagnosis not present

## 2018-05-21 LAB — POCT GLYCOSYLATED HEMOGLOBIN (HGB A1C): Hemoglobin A1C: 5.3 % (ref 4.0–5.6)

## 2018-05-21 LAB — POCT GLUCOSE (DEVICE FOR HOME USE): POC GLUCOSE: 145 mg/dL — AB (ref 70–99)

## 2018-05-21 NOTE — Patient Instructions (Signed)
Follow up visit in 4 months. Please repeat lab tests 1-2 weeks prior. 
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
## 2018-05-22 ENCOUNTER — Encounter: Payer: Self-pay | Admitting: Cardiology

## 2018-05-22 DIAGNOSIS — M1712 Unilateral primary osteoarthritis, left knee: Secondary | ICD-10-CM | POA: Diagnosis not present

## 2018-05-22 DIAGNOSIS — D0511 Intraductal carcinoma in situ of right breast: Secondary | ICD-10-CM | POA: Diagnosis not present

## 2018-05-22 DIAGNOSIS — Z9011 Acquired absence of right breast and nipple: Secondary | ICD-10-CM | POA: Diagnosis not present

## 2018-05-24 DIAGNOSIS — F3174 Bipolar disorder, in full remission, most recent episode manic: Secondary | ICD-10-CM | POA: Diagnosis not present

## 2018-05-29 ENCOUNTER — Telehealth: Payer: Self-pay

## 2018-05-29 NOTE — Telephone Encounter (Signed)
Pt's daughter, [name], made aware that FMLA paperwork has been filled out, signed, and placed at front desk for pick up.  [name] states that she will be by the office before the end of the week.

## 2018-06-04 LAB — CUP PACEART REMOTE DEVICE CHECK
Battery Voltage: 2.79 V
Brady Statistic AP VS Percent: 36 %
Brady Statistic AS VP Percent: 0 %
Implantable Lead Implant Date: 20010521
Implantable Lead Location: 753859
Implantable Lead Location: 753860
Implantable Lead Model: 6940
Lead Channel Impedance Value: 599 Ohm
Lead Channel Pacing Threshold Amplitude: 0.625 V
Lead Channel Pacing Threshold Pulse Width: 0.4 ms
Lead Channel Setting Pacing Amplitude: 2 V
Lead Channel Setting Pacing Amplitude: 2.75 V
Lead Channel Setting Pacing Pulse Width: 0.46 ms
Lead Channel Setting Sensing Sensitivity: 4 mV
MDC IDC LEAD IMPLANT DT: 20010521
MDC IDC MSMT BATTERY IMPEDANCE: 158 Ohm
MDC IDC MSMT BATTERY REMAINING LONGEVITY: 149 mo
MDC IDC MSMT LEADCHNL RV IMPEDANCE VALUE: 1093 Ohm
MDC IDC MSMT LEADCHNL RV PACING THRESHOLD AMPLITUDE: 1.375 V
MDC IDC MSMT LEADCHNL RV PACING THRESHOLD PULSEWIDTH: 0.4 ms
MDC IDC PG IMPLANT DT: 20161229
MDC IDC SESS DTM: 20191007113653
MDC IDC STAT BRADY AP VP PERCENT: 8 %
MDC IDC STAT BRADY AS VS PERCENT: 55 %

## 2018-06-05 ENCOUNTER — Ambulatory Visit (INDEPENDENT_AMBULATORY_CARE_PROVIDER_SITE_OTHER)
Admission: RE | Admit: 2018-06-05 | Discharge: 2018-06-05 | Disposition: A | Discharge status: Home / Self Care | Payer: Medicare HMO | Source: Ambulatory Visit | Attending: Internal Medicine | Admitting: Internal Medicine

## 2018-06-05 ENCOUNTER — Ambulatory Visit (INDEPENDENT_AMBULATORY_CARE_PROVIDER_SITE_OTHER): Payer: Medicare HMO | Admitting: Internal Medicine

## 2018-06-05 ENCOUNTER — Encounter: Payer: Self-pay | Admitting: Internal Medicine

## 2018-06-05 VITALS — BP 124/72 | HR 60 | Temp 97.9°F | Resp 14 | Ht 64.47 in | Wt 103.0 lb

## 2018-06-05 DIAGNOSIS — J209 Acute bronchitis, unspecified: Secondary | ICD-10-CM

## 2018-06-05 DIAGNOSIS — R0602 Shortness of breath: Secondary | ICD-10-CM | POA: Diagnosis not present

## 2018-06-05 DIAGNOSIS — R05 Cough: Secondary | ICD-10-CM | POA: Diagnosis not present

## 2018-06-05 MED ORDER — DOXYCYCLINE HYCLATE 100 MG PO TABS: 100.0000 mg | ORAL_TABLET | Freq: Two times a day (BID) | ORAL | 0 refills | Status: DC
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
## 2018-06-05 NOTE — Assessment & Plan Note (Signed)
Symptoms consistent with acute bacterial bronchitis Start doxycycline Continue over-the-counter cold medications as needed Increase rest and fluids We will get a chest x-ray today to make sure she does not have early pneumonia.  Also concerned about mild weight loss despite her eating normal meals, snacks and Ensure-we will monitor Call if no improvement

## 2018-06-05 NOTE — Patient Instructions (Signed)
Have a chest xray today downstairs.   Take the antibiotic as prescribed - complete the entire course.    Continue over the counter cold medication, advil and tylenol.  Increase your fluids and rest.    Call if no improvement

## 2018-06-07 ENCOUNTER — Other Ambulatory Visit: Payer: Self-pay | Admitting: Internal Medicine

## 2018-06-17 ENCOUNTER — Other Ambulatory Visit: Payer: Self-pay | Admitting: Internal Medicine

## 2018-06-19 ENCOUNTER — Other Ambulatory Visit: Payer: Self-pay | Admitting: Internal Medicine

## 2018-07-04 ENCOUNTER — Encounter: Payer: Self-pay | Admitting: Internal Medicine

## 2018-07-10 ENCOUNTER — Other Ambulatory Visit: Payer: Self-pay | Admitting: Internal Medicine

## 2018-07-10 ENCOUNTER — Ambulatory Visit
Admission: RE | Admit: 2018-07-10 | Discharge: 2018-07-10 | Disposition: A | Discharge status: Home / Self Care | Payer: Medicare HMO | Source: Ambulatory Visit | Attending: Internal Medicine | Admitting: Internal Medicine

## 2018-07-10 ENCOUNTER — Encounter: Payer: Self-pay | Admitting: Internal Medicine

## 2018-07-10 DIAGNOSIS — Z853 Personal history of malignant neoplasm of breast: Secondary | ICD-10-CM

## 2018-07-10 DIAGNOSIS — R921 Mammographic calcification found on diagnostic imaging of breast: Secondary | ICD-10-CM | POA: Diagnosis not present

## 2018-07-10 NOTE — Telephone Encounter (Signed)
Forms have been completed & signed, Copy sent to scan.   Daughter informed the original is ready to be picked up. She did not need it faxed anywhere. Charged for under the daughter Joseph Art.

## 2018-07-10 NOTE — Telephone Encounter (Signed)
Patient daughter has dropped off the FMLA forms to be completed.   I spoke with [name] about the frequency. She was not to sure what she needed. But she works 12 hours shifts.   12 hours - 4 times a month. Giving her a day a week if needed. She is starting to think she may use it more. Are you okay with this?   Please advise, Thank you.
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
## 2018-07-25 ENCOUNTER — Ambulatory Visit (INDEPENDENT_AMBULATORY_CARE_PROVIDER_SITE_OTHER): Payer: Medicare HMO | Admitting: Internal Medicine

## 2018-07-25 ENCOUNTER — Ambulatory Visit (INDEPENDENT_AMBULATORY_CARE_PROVIDER_SITE_OTHER): Payer: Medicare HMO | Admitting: *Deleted

## 2018-07-25 ENCOUNTER — Encounter: Payer: Self-pay | Admitting: Internal Medicine

## 2018-07-25 VITALS — BP 104/64 | HR 64 | Temp 98.6°F | Resp 17 | Ht 64.47 in | Wt 104.0 lb

## 2018-07-25 VITALS — BP 104/64 | HR 64 | Temp 98.0°F | Resp 17 | Ht 64.0 in | Wt 104.0 lb

## 2018-07-25 DIAGNOSIS — J449 Chronic obstructive pulmonary disease, unspecified: Secondary | ICD-10-CM | POA: Diagnosis not present

## 2018-07-25 DIAGNOSIS — E063 Autoimmune thyroiditis: Secondary | ICD-10-CM

## 2018-07-25 DIAGNOSIS — N3281 Overactive bladder: Secondary | ICD-10-CM

## 2018-07-25 DIAGNOSIS — R5381 Other malaise: Secondary | ICD-10-CM

## 2018-07-25 DIAGNOSIS — I2510 Atherosclerotic heart disease of native coronary artery without angina pectoris: Secondary | ICD-10-CM | POA: Diagnosis not present

## 2018-07-25 DIAGNOSIS — Z0000 Encounter for general adult medical examination without abnormal findings: Secondary | ICD-10-CM | POA: Diagnosis not present

## 2018-07-25 DIAGNOSIS — Z9861 Coronary angioplasty status: Secondary | ICD-10-CM

## 2018-07-25 DIAGNOSIS — E119 Type 2 diabetes mellitus without complications: Secondary | ICD-10-CM

## 2018-07-25 DIAGNOSIS — F03A0 Unspecified dementia, mild, without behavioral disturbance, psychotic disturbance, mood disturbance, and anxiety: Secondary | ICD-10-CM

## 2018-07-25 DIAGNOSIS — F0390 Unspecified dementia without behavioral disturbance: Secondary | ICD-10-CM | POA: Diagnosis not present

## 2018-07-25 DIAGNOSIS — I10 Essential (primary) hypertension: Secondary | ICD-10-CM | POA: Diagnosis not present

## 2018-07-25 DIAGNOSIS — E782 Mixed hyperlipidemia: Secondary | ICD-10-CM

## 2018-07-25 NOTE — Patient Instructions (Addendum)
[facility] in [place], [place] in: [place] Address: [address], [place], [place] [address] Phone: [phone]  [facility] [place] [facility] in [place], [place] Address: [address], [place], [place] [address] Phone: [phone]  Please ask Dr. Katy Fitch to send eye exam results to Dr. Quay Burow.   Bring a copy of your living will and/or healthcare power of attorney to your next office visit.  Continue doing brain stimulating activities (puzzles, reading, adult coloring books, staying active) to keep memory sharp.   Continue to eat heart healthy diet (full of fruits, vegetables, whole grains, lean protein, water--limit salt, fat, and sugar intake) and increase physical activity as tolerated.   Stacy [name] , Thank you for taking time to come for your Medicare Wellness Visit. I appreciate your ongoing commitment to your health goals. Please review the following plan we discussed and let me know if I can assist you in the future.   These are the goals we discussed: Goals    . Patient Stated     Start to walk more and eat more. Check into [employer] and into [employer] to see walk. Continue drinking ensure and go out with my friends more because I eat better.     . Patient Stated     Increase the amount I eat by eating more frequently and continuing to drink ensure.       This is a list of the screening recommended for you and due dates:  Health Maintenance  Topic Date Due  . Complete foot exam   01/29/2018  . Eye exam for diabetics  05/11/2018  . Hemoglobin A1C  11/20/2018  . DEXA scan (bone density measurement)  12/07/2021  . Tetanus Vaccine  07/28/2027  . Flu Shot  Completed  . Pneumonia vaccines  Completed   Health Maintenance, Female Adopting a healthy lifestyle and getting preventive care can go a long way to promote health and wellness. Talk with your health care  provider about what schedule of regular examinations is right for you. This is a good chance for you to check in with your provider about disease prevention and staying healthy. In between checkups, there are plenty of things you can do on your own. Experts have done a lot of research about which lifestyle changes and preventive measures are most likely to keep you healthy. Ask your health care provider for more information. Weight and diet Eat a healthy diet  Be sure to include plenty of vegetables, fruits, low-fat dairy products, and lean protein.  Do not eat a lot of foods high in solid fats, added sugars, or salt.  Get regular exercise. This is one of the most important things you can do for your health. ? Most adults should exercise for at least 150 minutes each week. The exercise should increase your heart rate and make you sweat (moderate-intensity exercise). ? Most adults should also do strengthening exercises at least twice a week. This is in addition to the moderate-intensity exercise. Maintain a healthy weight  Body mass index (BMI) is a measurement that can be used to identify possible weight problems. It estimates body fat based on height and weight. Your health care provider can help determine your BMI and help you achieve or maintain a healthy weight.  For females 3 years of age and older: ? A BMI below 18.5 is considered underweight. ? A BMI of 18.5 to 24.9 is normal. ?  A BMI of 25 to 29.9 is considered overweight. ? A BMI of 30 and above is considered obese. Watch levels of cholesterol and blood lipids  You should start having your blood tested for lipids and cholesterol at 77 years of age, then have this test every 5 years.  You may need to have your cholesterol levels checked more often if: ? Your lipid or cholesterol levels are high. ? You are older than 77 years of age. ? You are at high risk for heart disease. Cancer screening Lung Cancer  Lung cancer screening  is recommended for adults 46-73 years old who are at high risk for lung cancer because of a history of smoking.  A yearly low-dose CT scan of the lungs is recommended for people who: ? Currently smoke. ? Have quit within the past 15 years. ? Have at least a 30-pack-year history of smoking. A pack year is smoking an average of one pack of cigarettes a day for 1 year.  Yearly screening should continue until it has been 15 years since you quit.  Yearly screening should stop if you develop a health problem that would prevent you from having lung cancer treatment. Breast Cancer  Practice breast self-awareness. This means understanding how your breasts normally appear and feel.  It also means doing regular breast self-exams. Let your health care provider know about any changes, no matter how small.  If you are in your 20s or 30s, you should have a clinical breast exam (CBE) by a health care provider every 1-3 years as part of a regular health exam.  If you are 65 or older, have a CBE every year. Also consider having a breast X-ray (mammogram) every year.  If you have a family history of breast cancer, talk to your health care provider about genetic screening.  If you are at high risk for breast cancer, talk to your health care provider about having an MRI and a mammogram every year.  Breast cancer gene (BRCA) assessment is recommended for women who have family members with BRCA-related cancers. BRCA-related cancers include: ? Breast. ? Ovarian. ? Tubal. ? Peritoneal cancers.  Results of the assessment will determine the need for genetic counseling and BRCA1 and BRCA2 testing. Cervical Cancer Your health care provider may recommend that you be screened regularly for cancer of the pelvic organs (ovaries, uterus, and vagina). This screening involves a pelvic examination, including checking for microscopic changes to the surface of your cervix (Pap test). You may be encouraged to have this  screening done every 3 years, beginning at age 28.  For women ages 71-65, health care providers may recommend pelvic exams and Pap testing every 3 years, or they may recommend the Pap and pelvic exam, combined with testing for human papilloma virus (HPV), every 5 years. Some types of HPV increase your risk of cervical cancer. Testing for HPV may also be done on women of any age with unclear Pap test results.  Other health care providers may not recommend any screening for nonpregnant women who are considered low risk for pelvic cancer and who do not have symptoms. Ask your health care provider if a screening pelvic exam is right for you.  If you have had past treatment for cervical cancer or a condition that could lead to cancer, you need Pap tests and screening for cancer for at least 20 years after your treatment. If Pap tests have been discontinued, your risk factors (such as having a new sexual partner) need  to be reassessed to determine if screening should resume. Some women have medical problems that increase the chance of getting cervical cancer. In these cases, your health care provider may recommend more frequent screening and Pap tests. Colorectal Cancer  This type of cancer can be detected and often prevented.  Routine colorectal cancer screening usually begins at 77 years of age and continues through 77 years of age.  Your health care provider may recommend screening at an earlier age if you have risk factors for colon cancer.  Your health care provider may also recommend using home test kits to check for hidden blood in the stool.  A small camera at the end of a tube can be used to examine your colon directly (sigmoidoscopy or colonoscopy). This is done to check for the earliest forms of colorectal cancer.  Routine screening usually begins at age 72.  Direct examination of the colon should be repeated every 5-10 years through 77 years of age. However, you may need to be screened  more often if early forms of precancerous polyps or small growths are found. Skin Cancer  Check your skin from head to toe regularly.  Tell your health care provider about any new moles or changes in moles, especially if there is a change in a mole's shape or color.  Also tell your health care provider if you have a mole that is larger than the size of a pencil eraser.  Always use sunscreen. Apply sunscreen liberally and repeatedly throughout the day.  Protect yourself by wearing long sleeves, pants, a wide-brimmed hat, and sunglasses whenever you are outside. Heart disease, diabetes, and high blood pressure  High blood pressure causes heart disease and increases the risk of stroke. High blood pressure is more likely to develop in: ? People who have blood pressure in the high end of the normal range (130-139/85-89 mm Hg). ? People who are overweight or obese. ? People who are African American.  If you are 70-74 years of age, have your blood pressure checked every 3-5 years. If you are 40 years of age or older, have your blood pressure checked every year. You should have your blood pressure measured twice-once when you are at a hospital or clinic, and once when you are not at a hospital or clinic. Record the average of the two measurements. To check your blood pressure when you are not at a hospital or clinic, you can use: ? An automated blood pressure machine at a pharmacy. ? A home blood pressure monitor.  If you are between 6 years and 56 years old, ask your health care provider if you should take aspirin to prevent strokes.  Have regular diabetes screenings. This involves taking a blood sample to check your fasting blood sugar level. ? If you are at a normal weight and have a low risk for diabetes, have this test once every three years after 77 years of age. ? If you are overweight and have a high risk for diabetes, consider being tested at a younger age or more often. Preventing  infection Hepatitis B  If you have a higher risk for hepatitis B, you should be screened for this virus. You are considered at high risk for hepatitis B if: ? You were born in a country where hepatitis B is common. Ask your health care provider which countries are considered high risk. ? Your parents were born in a high-risk country, and you have not been immunized against hepatitis B (hepatitis B vaccine). ?  You have HIV or AIDS. ? You use needles to inject street drugs. ? You live with someone who has hepatitis B. ? You have had sex with someone who has hepatitis B. ? You get hemodialysis treatment. ? You take certain medicines for conditions, including cancer, organ transplantation, and autoimmune conditions. Hepatitis C  Blood testing is recommended for: ? Everyone born from [birth year] through 1965. ? Anyone with known risk factors for hepatitis C. Sexually transmitted infections (STIs)  You should be screened for sexually transmitted infections (STIs) including gonorrhea and chlamydia if: ? You are sexually active and are younger than 77 years of age. ? You are older than 77 years of age and your health care provider tells you that you are at risk for this type of infection. ? Your sexual activity has changed since you were last screened and you are at an increased risk for chlamydia or gonorrhea. Ask your health care provider if you are at risk.  If you do not have HIV, but are at risk, it may be recommended that you take a prescription medicine daily to prevent HIV infection. This is called pre-exposure prophylaxis (PrEP). You are considered at risk if: ? You are sexually active and do not regularly use condoms or know the HIV status of your partner(s). ? You take drugs by injection. ? You are sexually active with a partner who has HIV. Talk with your health care provider about whether you are at high risk of being infected with HIV. If you choose to begin PrEP, you should first be  tested for HIV. You should then be tested every 3 months for as long as you are taking PrEP. Pregnancy  If you are premenopausal and you may become pregnant, ask your health care provider about preconception counseling.  If you may become pregnant, take 400 to 800 micrograms (mcg) of folic acid every day.  If you want to prevent pregnancy, talk to your health care provider about birth control (contraception). Osteoporosis and menopause  Osteoporosis is a disease in which the bones lose minerals and strength with aging. This can result in serious bone fractures. Your risk for osteoporosis can be identified using a bone density scan.  If you are 102 years of age or older, or if you are at risk for osteoporosis and fractures, ask your health care provider if you should be screened.  Ask your health care provider whether you should take a calcium or vitamin D supplement to lower your risk for osteoporosis.  Menopause may have certain physical symptoms and risks.  Hormone replacement therapy may reduce some of these symptoms and risks. Talk to your health care provider about whether hormone replacement therapy is right for you. Follow these instructions at home:  Schedule regular health, dental, and eye exams.  Stay current with your immunizations.  Do not use any tobacco products including cigarettes, chewing tobacco, or electronic cigarettes.  If you are pregnant, do not drink alcohol.  If you are breastfeeding, limit how much and how often you drink alcohol.  Limit alcohol intake to no more than 1 drink per day for nonpregnant women. One drink equals 12 ounces of beer, 5 ounces of Jon Kasparek, or 1 ounces of hard liquor.  Do not use street drugs.  Do not share needles.  Ask your health care provider for help if you need support or information about quitting drugs.  Tell your health care provider if you often feel depressed.  Tell your health care provider if  you have ever been abused  or do not feel safe at home. This information is not intended to replace advice given to you by your health care provider. Make sure you discuss any questions you have with your health care provider. Document Released: 02/06/2011 Document Revised: 12/30/2015 Document Reviewed: 04/27/2015 Elsevier Interactive Patient Education  2019 [employer].

## 2018-07-25 NOTE — Assessment & Plan Note (Signed)
BP well controlled Current regimen effective and well tolerated Continue current medications at current doses  

## 2018-07-25 NOTE — Assessment & Plan Note (Signed)
No chest pain, palps Has some SOB likely from deconditioning Continue current medications

## 2018-07-25 NOTE — Patient Instructions (Addendum)
  Medications reviewed and updated.  Changes include :   none    Please followup in 6 months   

## 2018-07-25 NOTE — Assessment & Plan Note (Signed)
Continue PT exercises at home Stressed to continue increasing her walking

## 2018-07-25 NOTE — Assessment & Plan Note (Signed)
Following with and managed by endocrine Will have blood work next month so will hold off on blood work today

## 2018-07-25 NOTE — Assessment & Plan Note (Signed)
Continue statin. 

## 2018-07-25 NOTE — Assessment & Plan Note (Signed)
Controlled, stable Continue current dose of medication  

## 2018-07-25 NOTE — Assessment & Plan Note (Signed)
- 

## 2018-07-25 NOTE — Assessment & Plan Note (Signed)
Continue aricept 

## 2018-07-29 ENCOUNTER — Other Ambulatory Visit: Payer: Self-pay | Admitting: Internal Medicine

## 2018-07-29 NOTE — Telephone Encounter (Signed)
Crcl 62ml.min - not on multaq or oral ketoconazole.

## 2018-08-05 ENCOUNTER — Other Ambulatory Visit: Payer: Self-pay

## 2018-08-05 ENCOUNTER — Encounter: Payer: Self-pay | Admitting: Internal Medicine

## 2018-08-05 MED ORDER — ENSURE PO LIQD: 237.0000 mL | Freq: Two times a day (BID) | ORAL | 3 refills | Status: AC

## 2018-08-12 ENCOUNTER — Ambulatory Visit (INDEPENDENT_AMBULATORY_CARE_PROVIDER_SITE_OTHER): Payer: Medicare HMO

## 2018-08-12 DIAGNOSIS — I442 Atrioventricular block, complete: Secondary | ICD-10-CM

## 2018-08-13 LAB — CUP PACEART REMOTE DEVICE CHECK
Battery Impedance: 182 Ohm
Battery Remaining Longevity: 145 mo
Battery Voltage: 2.8 V
Brady Statistic AS VP Percent: 0 %
Brady Statistic AS VS Percent: 57 %
Implantable Lead Implant Date: 20010521
Implantable Lead Implant Date: 20010521
Implantable Lead Location: 753859
Implantable Lead Location: 753860
Implantable Pulse Generator Implant Date: 20161229
Lead Channel Impedance Value: 1099 Ohm
Lead Channel Impedance Value: 640 Ohm
Lead Channel Pacing Threshold Amplitude: 1.5 V
Lead Channel Pacing Threshold Pulse Width: 0.4 ms
Lead Channel Setting Pacing Amplitude: 2 V
Lead Channel Setting Pacing Amplitude: 3 V
Lead Channel Setting Pacing Pulse Width: 0.4 ms
Lead Channel Setting Sensing Sensitivity: 4 mV
MDC IDC MSMT LEADCHNL RA PACING THRESHOLD AMPLITUDE: 0.625 V
MDC IDC MSMT LEADCHNL RA PACING THRESHOLD PULSEWIDTH: 0.4 ms
MDC IDC SESS DTM: 20200106125708
MDC IDC STAT BRADY AP VP PERCENT: 8 %
MDC IDC STAT BRADY AP VS PERCENT: 35 %

## 2018-08-13 NOTE — Progress Notes (Signed)
Remote pacemaker transmission.   

## 2018-08-16 ENCOUNTER — Ambulatory Visit: Payer: Medicare HMO | Admitting: Internal Medicine

## 2018-08-16 ENCOUNTER — Encounter: Payer: Self-pay | Admitting: Internal Medicine

## 2018-08-16 VITALS — BP 110/72 | HR 60 | Ht 64.0 in | Wt 105.4 lb

## 2018-08-16 DIAGNOSIS — I480 Paroxysmal atrial fibrillation: Secondary | ICD-10-CM

## 2018-08-16 DIAGNOSIS — Z950 Presence of cardiac pacemaker: Secondary | ICD-10-CM

## 2018-08-16 DIAGNOSIS — I442 Atrioventricular block, complete: Secondary | ICD-10-CM

## 2018-08-16 LAB — CUP PACEART INCLINIC DEVICE CHECK
Battery Impedance: 158 Ohm
Battery Remaining Longevity: 150 mo
Battery Voltage: 2.8 V
Brady Statistic AP VP Percent: 8 %
Brady Statistic AP VS Percent: 35 %
Brady Statistic AS VP Percent: 0 %
Brady Statistic AS VS Percent: 57 %
Date Time Interrogation Session: 20200110143501
Implantable Lead Implant Date: 20010521
Implantable Lead Implant Date: 20010521
Implantable Lead Location: 753859
Implantable Lead Location: 753860
Implantable Lead Model: 4592
Implantable Lead Model: 6940
Implantable Pulse Generator Implant Date: 20161229
Lead Channel Impedance Value: 1059 Ohm
Lead Channel Impedance Value: 629 Ohm
Lead Channel Pacing Threshold Amplitude: 0.5 V
Lead Channel Pacing Threshold Amplitude: 0.625 V
Lead Channel Pacing Threshold Amplitude: 1.5 V
Lead Channel Pacing Threshold Pulse Width: 0.4 ms
Lead Channel Pacing Threshold Pulse Width: 0.4 ms
Lead Channel Pacing Threshold Pulse Width: 0.4 ms
Lead Channel Sensing Intrinsic Amplitude: 11.2 mV
Lead Channel Sensing Intrinsic Amplitude: 4 mV
Lead Channel Setting Pacing Amplitude: 2 V
Lead Channel Setting Pacing Amplitude: 2.5 V
Lead Channel Setting Pacing Pulse Width: 0.4 ms
Lead Channel Setting Sensing Sensitivity: 4 mV
MDC IDC MSMT LEADCHNL RA PACING THRESHOLD PULSEWIDTH: 0.4 ms
MDC IDC MSMT LEADCHNL RV PACING THRESHOLD AMPLITUDE: 1.25 V

## 2018-08-16 MED ORDER — DABIGATRAN ETEXILATE MESYLATE 150 MG PO CAPS: 150.0000 mg | ORAL_CAPSULE | Freq: Two times a day (BID) | ORAL | 3 refills | Status: DC

## 2018-08-16 NOTE — Patient Instructions (Addendum)
Medication Instructions:  Your physician recommends that you continue on your current medications as directed. Please refer to the Current Medication list given to you today.  Labwork: You will have labs drawn today: CMET, CBC   Testing/Procedures: None ordered.   Follow-Up: Your physician recommends that you schedule a follow-up appointment in:   6 months with Dr Caryl Comes  Remote monitoring is used to monitor your Pacemaker from home. This monitoring reduces the number of office visits required to check your device to one time per year. It allows [place] to keep an eye on the functioning of your device to ensure it is working properly. You are scheduled for a device check from home on 11/11/2018. You may send your transmission at any time that day. If you have a wireless device, the transmission will be sent automatically. After your physician reviews your transmission, you will receive a postcard with your next transmission date.   Any Other Special Instructions Will Be Listed Below (If Applicable).     If you need a refill on your cardiac medications before your next appointment, please call your pharmacy.
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
## 2018-08-17 LAB — CBC
Hematocrit: 40.2 % (ref 34.0–46.6)
Hemoglobin: 13.7 g/dL (ref 11.1–15.9)
MCH: 32.4 pg (ref 26.6–33.0)
MCHC: 34.1 g/dL (ref 31.5–35.7)
MCV: 95 fL (ref 79–97)
Platelets: 187 10*3/uL (ref 150–450)
RBC: 4.23 x10E6/uL (ref 3.77–5.28)
RDW: 12.1 % (ref 11.7–15.4)
WBC: 7.4 10*3/uL (ref 3.4–10.8)

## 2018-08-17 LAB — COMPREHENSIVE METABOLIC PANEL
ALT: 25 IU/L (ref 0–32)
AST: 27 IU/L (ref 0–40)
Albumin/Globulin Ratio: 2.3 — ABNORMAL HIGH (ref 1.2–2.2)
Albumin: 4.3 g/dL (ref 3.5–4.8)
Alkaline Phosphatase: 53 IU/L (ref 39–117)
BUN / CREAT RATIO: 20 (ref 12–28)
BUN: 21 mg/dL (ref 8–27)
Bilirubin Total: 0.3 mg/dL (ref 0.0–1.2)
CO2: 23 mmol/L (ref 20–29)
Calcium: 10.7 mg/dL — ABNORMAL HIGH (ref 8.7–10.3)
Chloride: 104 mmol/L (ref 96–106)
Creatinine, Ser: 1.05 mg/dL — ABNORMAL HIGH (ref 0.57–1.00)
GFR calc non Af Amer: 51 mL/min/{1.73_m2} — ABNORMAL LOW (ref >59)
GFR, EST AFRICAN AMERICAN: 59 mL/min/{1.73_m2} — AB (ref >59)
Globulin, Total: 1.9 g/dL (ref 1.5–4.5)
Glucose: 87 mg/dL (ref 65–99)
Potassium: 4.6 mmol/L (ref 3.5–5.2)
Sodium: 141 mmol/L (ref 134–144)
Total Protein: 6.2 g/dL (ref 6.0–8.5)

## 2018-08-21 ENCOUNTER — Other Ambulatory Visit: Payer: Self-pay | Admitting: Internal Medicine

## 2018-08-23 ENCOUNTER — Telehealth: Payer: Self-pay | Admitting: *Deleted

## 2018-08-23 NOTE — Telephone Encounter (Signed)
CALLED PATIENT TO ASK ABOUT RESCHEDULING FU FROM 08-29-18 DUE TO DR. KINARD BEING IN THE OR, SPOKE WITH PATIENT'S DAUGHTER NICOLE COX AND SHE AGREED TO APPT. ON 09-09-18 @ 10 AM

## 2018-08-29 ENCOUNTER — Ambulatory Visit: Payer: Medicare HMO | Admitting: Radiation Oncology

## 2018-08-29 DIAGNOSIS — F3174 Bipolar disorder, in full remission, most recent episode manic: Secondary | ICD-10-CM | POA: Diagnosis not present

## 2018-09-09 ENCOUNTER — Ambulatory Visit
Admission: RE | Admit: 2018-09-09 | Discharge: 2018-09-09 | Disposition: A | Discharge status: Home / Self Care | Payer: Medicare HMO | Source: Ambulatory Visit | Attending: Radiation Oncology | Admitting: Radiation Oncology

## 2018-09-09 ENCOUNTER — Other Ambulatory Visit: Payer: Self-pay

## 2018-09-09 ENCOUNTER — Encounter: Payer: Self-pay | Admitting: Radiation Oncology

## 2018-09-09 VITALS — BP 90/78 | HR 61 | Temp 97.8°F | Resp 20 | Ht 66.0 in | Wt 103.8 lb

## 2018-09-09 DIAGNOSIS — Z08 Encounter for follow-up examination after completed treatment for malignant neoplasm: Secondary | ICD-10-CM | POA: Diagnosis not present

## 2018-09-09 DIAGNOSIS — Z171 Estrogen receptor negative status [ER-]: Secondary | ICD-10-CM | POA: Diagnosis not present

## 2018-09-09 DIAGNOSIS — Z79811 Long term (current) use of aromatase inhibitors: Secondary | ICD-10-CM | POA: Diagnosis not present

## 2018-09-09 DIAGNOSIS — Z86000 Personal history of in-situ neoplasm of breast: Secondary | ICD-10-CM | POA: Diagnosis not present

## 2018-09-09 DIAGNOSIS — Z79899 Other long term (current) drug therapy: Secondary | ICD-10-CM | POA: Insufficient documentation

## 2018-09-09 DIAGNOSIS — D0511 Intraductal carcinoma in situ of right breast: Secondary | ICD-10-CM | POA: Diagnosis not present

## 2018-09-09 DIAGNOSIS — Z7984 Long term (current) use of oral hypoglycemic drugs: Secondary | ICD-10-CM | POA: Diagnosis not present

## 2018-09-09 NOTE — Progress Notes (Signed)
Radiation Oncology         [phone] ________________________________  Name: Stacy [name] MRN: [id number]  Date: 09/09/2018  DOB: 1941/06/02  Follow-Up Visit Note  CC: Stacy Rail, MD  Stacy Gemma, MD    ICD-10-CM   1. Ductal carcinoma in situ (DCIS) of right breast D05.11     Diagnosis: High-grade, DCIS of right breast, ER/PR (neg) Pathological stage:pTis, pNX     Interval Since Last Radiation:  10 months 10/24/2017 - 11/20/2017  1. Right breast, 2.67 Gy in 15 fractions for a total dose of 40.05 Gy  2. Lumpectomy Boost, 2 Gy in 5 fractions, for a total dose of 10 Gy  Narrative:  Stacy [name] presents today for her follow up appointment. She is accompanied by her daughter today.   Since she was last seen in the office, she had bilateral mammogram on July 10, 2018 which showed stable, probably benign right breast calcifications (short-term follow-up recommended). No mammographic evidence of malignancy was seen on the left.   She reports pain in her breast has resolved since her last follow-up in August. She also reports moderate fatigue (age-related). She denies swelling to the arm and any pain.  ALLERGIES:  is allergic to penicillins; latex; adhesive [tape]; codeine; metronidazole; and other.  Meds: Current Outpatient Medications  Medication Sig Dispense Refill  . acetaminophen (TYLENOL) 500 MG tablet Take 500 mg by mouth every 6 (six) hours as needed (for pain.).    Marland Kitchen Alcohol Swabs (B-D SINGLE USE SWABS REGULAR) PADS USE TWICE DAILY 200 each 2  . atorvastatin (LIPITOR) 80 MG tablet TAKE 1 TABLET EVERY DAY 90 tablet 0  . Biotin 10000 MCG TABS Take 10,000 mcg by mouth daily.    . busPIRone (BUSPAR) 15 MG tablet Take 15 mg by mouth daily.     Marland Kitchen CALCIUM CITRATE-VITAMIN D PO Take 1 tablet by mouth daily.     . dabigatran (PRADAXA) 150 MG CAPS capsule Take 1 capsule (150 mg total) by mouth 2 (two) times daily. 180 capsule 3  . donepezil (ARICEPT) 10 MG tablet Take  10 mg by mouth at bedtime.    . ENSURE (ENSURE) Take 237 mLs by mouth 2 (two) times daily between meals. 42660 mL 3  . FLUoxetine (PROZAC) 20 MG tablet fluoxetine 20 mg capsule    . glucose blood (ACCU-CHEK AVIVA PLUS) test strip Use to check blood sugars 2 times daily. E11.9 180 each 5  . ketoconazole (NIZORAL) 2 % cream Apply 1 application topically daily. 60 g 5  . ketotifen (ZADITOR) 0.025 % ophthalmic solution Place 1 drop into both eyes as needed.    Marland Kitchen levothyroxine (SYNTHROID, LEVOTHROID) 112 MCG tablet Take 112-168 mcg by mouth daily before breakfast. Patient takes an extra 1/2 tablet twice a week on Tuesday and Friday    . lithium carbonate (LITHOBID) 300 MG CR tablet lithium carbonate ER 300 mg tablet,extended release    . loratadine (CLARITIN) 10 MG tablet Take 10 mg by mouth daily.    Marland Kitchen losartan (COZAAR) 50 MG tablet Take 50 mg by mouth daily.    . Melatonin 5 MG TABS Take 1 tablet by mouth daily.     . metFORMIN (GLUCOPHAGE-XR) 500 MG 24 hr tablet Take 500 mg by mouth daily with breakfast.    . montelukast (SINGULAIR) 10 MG tablet Take 1 tablet (10 mg total) by mouth at bedtime. 30 tablet 3  . Multiple Vitamin (MULTIVITAMIN WITH MINERALS) TABS tablet Take 1 tablet by mouth  daily.     . non-metallic deodorant (ALRA) MISC Apply 1 application topically daily as needed.    Marland Kitchen omeprazole (PRILOSEC) 40 MG capsule TAKE 1 CAPSULE (40 MG) BY MOUTH ON SUNDAY, MONDAY, WEDNESDAY, & FRIDAYS (FOUR TIMES WEEKLY) 90 capsule 0  . oxybutynin (DITROPAN) 5 MG tablet TAKE 1 TABLET TWICE DAILY 180 tablet 1  . Polyethyl Glycol-Propyl Glycol (SYSTANE ULTRA) 0.4-0.3 % SOLN Place 1-2 drops into both eyes 3 (three) times daily as needed (for dry/irritated eyes.).    Marland Kitchen PRODIGY TWIST TOP LANCETS 28G MISC CHECK BLOOD SUGAR EVERY DAY 100 each 1  . Pyridoxine HCl (VITAMIN B-6) 500 MG tablet Take 500 mg by mouth 2 (two) times daily.    . traMADol (ULTRAM) 50 MG tablet Take 1 tablet (50 mg total) by mouth every 6  (six) hours as needed for moderate pain. 15 tablet 0  . White Petrolatum-Mineral Oil (SYSTANE NIGHTTIME) OINT Apply to eye.     No current facility-administered medications for this encounter.     Physical Findings: The patient is in no acute distress. Patient is alert and oriented.  height is 5\' 6"  (1.676 m) and weight is 103 lb 12.8 oz (47.1 kg). Her oral temperature is 97.8 F (36.6 C). Her blood pressure is 90/78 and her pulse is 61. Her respiration is 20 and oxygen saturation is 98%. .  No significant changes. Lungs are clear to auscultation bilaterally. Heart has regular rate and rhythm. No palpable cervical, supraclavicular, or axillary adenopathy. Abdomen soft, non-tender, normal bowel sounds. She has a pacemaker in place (her third that she has had).  Left breast: no palpable mass, nipple discharge or bleeding.  Right breast: lumpectomy scar in the lateral aspect of the breast. No palpable or visual signs of recurrence.  Lab Findings: Lab Results  Component Value Date   WBC 7.4 08/16/2018   HGB 13.7 08/16/2018   HCT 40.2 08/16/2018   MCV 95 08/16/2018   PLT 187 08/16/2018    Radiographic Findings: No results found.  Impression: No evidence of recurrence on clinical exam. Recent mammogram showed no suspicious areas but did recommend short term follow up.   Plan:  She will undergo a follow-up mammogram on June 9 and will follow-up in radiation oncology June 11 at [facility].  -----------------------------------  Blair Promise, PhD, MD  This document serves as a record of services personally performed by Gery Pray, MD. It was created on his behalf by Mary-Margaret Loma Messing, a trained medical scribe. The creation of this record is based on the scribe's personal observations and the provider's statements to them. This document has been checked and approved by the attending provider.

## 2018-09-09 NOTE — Progress Notes (Signed)
Pt presents today for f/u with Dr. Sondra Come. Pt is accompanied by HCPOA. Pt reports fatigue from radiation has resolved and attributes any fatigue now to age. Pt denies c/o pain in breast but has daily chronic in left knee, currently rated 5/10. Pt reports using goldbond diabetic cream on skin/breast. Breast is WNL.   BP 90/78 (BP Location: Left Arm, Patient Position: Sitting)   Pulse 61   Temp 97.8 F (36.6 C) (Oral)   Resp 20   Ht 5\' 6"  (1.676 m)   Wt 103 lb 12.8 oz (47.1 kg)   SpO2 98%   BMI 16.75 kg/m   Wt Readings from Last 3 Encounters:  09/09/18 103 lb 12.8 oz (47.1 kg)  08/16/18 105 lb 6.4 oz (47.8 kg)  07/25/18 104 lb (47.2 kg)   Loma Sousa, RN BSN

## 2018-09-11 ENCOUNTER — Other Ambulatory Visit: Payer: Self-pay | Admitting: Internal Medicine

## 2018-09-18 DIAGNOSIS — E119 Type 2 diabetes mellitus without complications: Secondary | ICD-10-CM | POA: Diagnosis not present

## 2018-09-18 DIAGNOSIS — E211 Secondary hyperparathyroidism, not elsewhere classified: Secondary | ICD-10-CM | POA: Diagnosis not present

## 2018-09-18 DIAGNOSIS — I10 Essential (primary) hypertension: Secondary | ICD-10-CM | POA: Diagnosis not present

## 2018-09-18 DIAGNOSIS — Z79899 Other long term (current) drug therapy: Secondary | ICD-10-CM | POA: Diagnosis not present

## 2018-09-18 DIAGNOSIS — E559 Vitamin D deficiency, unspecified: Secondary | ICD-10-CM | POA: Diagnosis not present

## 2018-09-18 DIAGNOSIS — E063 Autoimmune thyroiditis: Secondary | ICD-10-CM | POA: Diagnosis not present

## 2018-09-20 LAB — PTH, INTACT AND CALCIUM
Calcium: 10.5 mg/dL — ABNORMAL HIGH (ref 8.6–10.4)
PTH: 23 pg/mL (ref 14–64)

## 2018-09-20 LAB — VITAMIN D 25 HYDROXY (VIT D DEFICIENCY, FRACTURES): Vit D, 25-Hydroxy: 53 ng/mL (ref 30–100)

## 2018-09-20 LAB — MICROALBUMIN / CREATININE URINE RATIO
CREATININE, URINE: 110 mg/dL (ref 20–275)
MICROALB UR: 0.4 mg/dL
Microalb Creat Ratio: 4 mcg/mg creat (ref <30)

## 2018-09-20 LAB — T3, FREE: T3 FREE: 2.6 pg/mL (ref 2.3–4.2)

## 2018-09-24 ENCOUNTER — Ambulatory Visit (INDEPENDENT_AMBULATORY_CARE_PROVIDER_SITE_OTHER): Payer: Medicare HMO | Admitting: "Endocrinology

## 2018-09-24 ENCOUNTER — Encounter (INDEPENDENT_AMBULATORY_CARE_PROVIDER_SITE_OTHER): Payer: Self-pay | Admitting: "Endocrinology

## 2018-09-24 VITALS — BP 106/64 | HR 80 | Wt 105.0 lb

## 2018-09-24 DIAGNOSIS — E782 Mixed hyperlipidemia: Secondary | ICD-10-CM

## 2018-09-24 DIAGNOSIS — R5383 Other fatigue: Secondary | ICD-10-CM | POA: Diagnosis not present

## 2018-09-24 DIAGNOSIS — I10 Essential (primary) hypertension: Secondary | ICD-10-CM

## 2018-09-24 DIAGNOSIS — E049 Nontoxic goiter, unspecified: Secondary | ICD-10-CM

## 2018-09-24 DIAGNOSIS — E211 Secondary hyperparathyroidism, not elsewhere classified: Secondary | ICD-10-CM

## 2018-09-24 DIAGNOSIS — R251 Tremor, unspecified: Secondary | ICD-10-CM | POA: Diagnosis not present

## 2018-09-24 DIAGNOSIS — R7303 Prediabetes: Secondary | ICD-10-CM

## 2018-09-24 DIAGNOSIS — F0150 Vascular dementia without behavioral disturbance: Secondary | ICD-10-CM

## 2018-09-24 DIAGNOSIS — H833X1 Noise effects on right inner ear: Secondary | ICD-10-CM

## 2018-09-24 DIAGNOSIS — Z720 Tobacco use: Secondary | ICD-10-CM

## 2018-09-24 DIAGNOSIS — E559 Vitamin D deficiency, unspecified: Secondary | ICD-10-CM | POA: Diagnosis not present

## 2018-09-24 DIAGNOSIS — F3162 Bipolar disorder, current episode mixed, moderate: Secondary | ICD-10-CM

## 2018-09-24 DIAGNOSIS — E063 Autoimmune thyroiditis: Secondary | ICD-10-CM | POA: Diagnosis not present

## 2018-09-24 NOTE — Patient Instructions (Signed)
Follow up in 4 months. Please obtain lab work about one week prior.
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
## 2018-09-25 LAB — T3, FREE: T3, Free: 2.4 pg/mL (ref 2.3–4.2)

## 2018-09-25 LAB — T4, FREE: FREE T4: 1.2 ng/dL (ref 0.8–1.8)

## 2018-09-25 LAB — TSH: TSH: 1.69 mIU/L (ref 0.40–4.50)

## 2018-09-30 ENCOUNTER — Encounter (INDEPENDENT_AMBULATORY_CARE_PROVIDER_SITE_OTHER): Payer: Self-pay | Admitting: *Deleted

## 2018-10-07 DIAGNOSIS — H02235 Paralytic lagophthalmos left lower eyelid: Secondary | ICD-10-CM | POA: Diagnosis not present

## 2018-10-07 DIAGNOSIS — H16223 Keratoconjunctivitis sicca, not specified as Sjogren's, bilateral: Secondary | ICD-10-CM | POA: Diagnosis not present

## 2018-10-07 DIAGNOSIS — Z961 Presence of intraocular lens: Secondary | ICD-10-CM | POA: Diagnosis not present

## 2018-10-07 LAB — HM DIABETES EYE EXAM

## 2018-10-28 ENCOUNTER — Other Ambulatory Visit: Payer: Self-pay | Admitting: Internal Medicine

## 2018-11-04 ENCOUNTER — Other Ambulatory Visit: Payer: Self-pay | Admitting: Internal Medicine

## 2018-11-11 ENCOUNTER — Other Ambulatory Visit: Payer: Self-pay

## 2018-11-11 ENCOUNTER — Ambulatory Visit (INDEPENDENT_AMBULATORY_CARE_PROVIDER_SITE_OTHER): Payer: Medicare HMO | Admitting: *Deleted

## 2018-11-11 DIAGNOSIS — I442 Atrioventricular block, complete: Secondary | ICD-10-CM | POA: Diagnosis not present

## 2018-11-11 LAB — CUP PACEART REMOTE DEVICE CHECK
Battery Impedance: 182 Ohm
Battery Remaining Longevity: 143 mo
Battery Voltage: 2.79 V
Brady Statistic AP VP Percent: 9 %
Brady Statistic AP VS Percent: 36 %
Brady Statistic AS VP Percent: 0 %
Brady Statistic AS VS Percent: 55 %
Date Time Interrogation Session: 20200406110338
Implantable Lead Implant Date: 20010521
Implantable Lead Implant Date: 20010521
Implantable Lead Location: 753859
Implantable Lead Location: 753860
Implantable Lead Model: 4592
Implantable Lead Model: 6940
Implantable Pulse Generator Implant Date: 20161229
Lead Channel Impedance Value: 1064 Ohm
Lead Channel Impedance Value: 570 Ohm
Lead Channel Pacing Threshold Amplitude: 0.5 V
Lead Channel Pacing Threshold Amplitude: 1.375 V
Lead Channel Pacing Threshold Pulse Width: 0.4 ms
Lead Channel Pacing Threshold Pulse Width: 0.4 ms
Lead Channel Setting Pacing Amplitude: 2 V
Lead Channel Setting Pacing Amplitude: 3 V
Lead Channel Setting Pacing Pulse Width: 0.4 ms
Lead Channel Setting Sensing Sensitivity: 4 mV

## 2018-11-14 ENCOUNTER — Encounter: Payer: Self-pay | Admitting: Internal Medicine

## 2018-11-15 ENCOUNTER — Other Ambulatory Visit (INDEPENDENT_AMBULATORY_CARE_PROVIDER_SITE_OTHER): Payer: Self-pay | Admitting: "Endocrinology

## 2018-11-19 ENCOUNTER — Encounter: Payer: Self-pay | Admitting: Cardiology

## 2018-11-19 NOTE — Progress Notes (Signed)
Remote pacemaker transmission.   

## 2018-11-26 ENCOUNTER — Other Ambulatory Visit: Payer: Self-pay | Admitting: Internal Medicine

## 2018-12-06 ENCOUNTER — Telehealth: Payer: Self-pay | Admitting: Internal Medicine

## 2018-12-06 NOTE — Telephone Encounter (Signed)
New Message:    Patient daughter calling concering a letter she received concering a appt for July. Please call patient daughter.

## 2018-12-13 ENCOUNTER — Encounter: Payer: Self-pay | Admitting: Internal Medicine

## 2018-12-16 NOTE — Patient Instructions (Addendum)
  Medications reviewed and updated.  Changes include :   Steroid cream for your hemorrhoids  Your prescription(s) have been submitted to your pharmacy. Please take as directed and contact our office if you believe you are having problem(s) with the medication(s).   Please followup in 6 months
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
## 2018-12-17 ENCOUNTER — Ambulatory Visit (INDEPENDENT_AMBULATORY_CARE_PROVIDER_SITE_OTHER): Payer: Medicare HMO | Admitting: Internal Medicine

## 2018-12-17 ENCOUNTER — Other Ambulatory Visit: Payer: Self-pay

## 2018-12-17 ENCOUNTER — Encounter: Payer: Self-pay | Admitting: Internal Medicine

## 2018-12-17 VITALS — BP 138/86 | HR 65 | Temp 98.2°F | Resp 16 | Ht 66.0 in | Wt 104.8 lb

## 2018-12-17 DIAGNOSIS — F03A0 Unspecified dementia, mild, without behavioral disturbance, psychotic disturbance, mood disturbance, and anxiety: Secondary | ICD-10-CM

## 2018-12-17 DIAGNOSIS — I2510 Atherosclerotic heart disease of native coronary artery without angina pectoris: Secondary | ICD-10-CM

## 2018-12-17 DIAGNOSIS — Z9861 Coronary angioplasty status: Secondary | ICD-10-CM

## 2018-12-17 DIAGNOSIS — I10 Essential (primary) hypertension: Secondary | ICD-10-CM

## 2018-12-17 DIAGNOSIS — F0390 Unspecified dementia without behavioral disturbance: Secondary | ICD-10-CM

## 2018-12-17 DIAGNOSIS — E119 Type 2 diabetes mellitus without complications: Secondary | ICD-10-CM

## 2018-12-17 DIAGNOSIS — N3281 Overactive bladder: Secondary | ICD-10-CM

## 2018-12-17 DIAGNOSIS — E782 Mixed hyperlipidemia: Secondary | ICD-10-CM

## 2018-12-17 DIAGNOSIS — E063 Autoimmune thyroiditis: Secondary | ICD-10-CM | POA: Diagnosis not present

## 2018-12-17 DIAGNOSIS — F319 Bipolar disorder, unspecified: Secondary | ICD-10-CM | POA: Diagnosis not present

## 2018-12-17 MED ORDER — HYDROCORTISONE 2.5 % RE CREA: 1.0000 "application " | TOPICAL_CREAM | Freq: Two times a day (BID) | RECTAL | 5 refills | Status: DC

## 2018-12-17 NOTE — Assessment & Plan Note (Signed)
Stable, controlled Continue current dose of oxybutynin

## 2018-12-17 NOTE — Assessment & Plan Note (Signed)
Following with endocrine and has a follow-up next month ?  Need for metformin

## 2018-12-17 NOTE — Assessment & Plan Note (Signed)
Stable Continue Aricept Continue regular exercise and doing puzzles

## 2018-12-17 NOTE — Assessment & Plan Note (Signed)
Continue statin. 

## 2018-12-17 NOTE — Assessment & Plan Note (Signed)
No concerning symptoms including chest pain, increasing shortness of breath-she does have some baseline shortness of breath which is likely related to deconditioning and it is unchanged Continue current medications Blood work up-to-date and will be having additional blood work next month

## 2018-12-17 NOTE — Assessment & Plan Note (Addendum)
BP at home well controlled w/o medication She did have hypotension while on medication Daughter is monitoring BP at home If BP elevates - will start losartan 25 mg daily

## 2018-12-17 NOTE — Assessment & Plan Note (Signed)
Management per endocrine-appointment next month Clinically euthyroid

## 2018-12-20 ENCOUNTER — Other Ambulatory Visit: Payer: Self-pay | Admitting: Internal Medicine

## 2018-12-30 ENCOUNTER — Other Ambulatory Visit: Payer: Self-pay | Admitting: Internal Medicine

## 2019-01-08 DIAGNOSIS — Z79899 Other long term (current) drug therapy: Secondary | ICD-10-CM | POA: Diagnosis not present

## 2019-01-08 DIAGNOSIS — E559 Vitamin D deficiency, unspecified: Secondary | ICD-10-CM | POA: Diagnosis not present

## 2019-01-08 DIAGNOSIS — E211 Secondary hyperparathyroidism, not elsewhere classified: Secondary | ICD-10-CM | POA: Diagnosis not present

## 2019-01-09 LAB — PTH, INTACT AND CALCIUM
Calcium: 11 mg/dL — ABNORMAL HIGH (ref 8.6–10.4)
PTH: 19 pg/mL (ref 14–64)

## 2019-01-09 LAB — VITAMIN D 25 HYDROXY (VIT D DEFICIENCY, FRACTURES): Vit D, 25-Hydroxy: 56 ng/mL (ref 30–100)

## 2019-01-14 ENCOUNTER — Other Ambulatory Visit: Payer: Self-pay | Admitting: Internal Medicine

## 2019-01-14 ENCOUNTER — Other Ambulatory Visit: Payer: Self-pay

## 2019-01-14 ENCOUNTER — Ambulatory Visit
Admission: RE | Admit: 2019-01-14 | Discharge: 2019-01-14 | Disposition: A | Discharge status: Home / Self Care | Payer: Medicare HMO | Source: Ambulatory Visit | Attending: Internal Medicine | Admitting: Internal Medicine

## 2019-01-14 ENCOUNTER — Ambulatory Visit (INDEPENDENT_AMBULATORY_CARE_PROVIDER_SITE_OTHER): Payer: Medicare HMO | Admitting: "Endocrinology

## 2019-01-14 ENCOUNTER — Encounter: Payer: Self-pay | Admitting: Internal Medicine

## 2019-01-14 ENCOUNTER — Encounter (INDEPENDENT_AMBULATORY_CARE_PROVIDER_SITE_OTHER): Payer: Self-pay | Admitting: "Endocrinology

## 2019-01-14 VITALS — BP 118/76 | HR 86 | Wt 104.0 lb

## 2019-01-14 DIAGNOSIS — Z853 Personal history of malignant neoplasm of breast: Secondary | ICD-10-CM | POA: Diagnosis not present

## 2019-01-14 DIAGNOSIS — E063 Autoimmune thyroiditis: Secondary | ICD-10-CM

## 2019-01-14 DIAGNOSIS — E211 Secondary hyperparathyroidism, not elsewhere classified: Secondary | ICD-10-CM

## 2019-01-14 DIAGNOSIS — R634 Abnormal weight loss: Secondary | ICD-10-CM | POA: Diagnosis not present

## 2019-01-14 DIAGNOSIS — E1165 Type 2 diabetes mellitus with hyperglycemia: Secondary | ICD-10-CM | POA: Diagnosis not present

## 2019-01-14 DIAGNOSIS — R7303 Prediabetes: Secondary | ICD-10-CM

## 2019-01-14 DIAGNOSIS — R432 Parageusia: Secondary | ICD-10-CM

## 2019-01-14 DIAGNOSIS — E049 Nontoxic goiter, unspecified: Secondary | ICD-10-CM

## 2019-01-14 DIAGNOSIS — R922 Inconclusive mammogram: Secondary | ICD-10-CM | POA: Diagnosis not present

## 2019-01-14 DIAGNOSIS — E559 Vitamin D deficiency, unspecified: Secondary | ICD-10-CM

## 2019-01-14 DIAGNOSIS — R5383 Other fatigue: Secondary | ICD-10-CM

## 2019-01-14 LAB — POCT GLUCOSE (DEVICE FOR HOME USE): POC Glucose: 111 mg/dl — AB (ref 70–99)

## 2019-01-14 LAB — POCT GLYCOSYLATED HEMOGLOBIN (HGB A1C): Hemoglobin A1C: 5.8 % — AB (ref 4.0–5.6)
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
## 2019-01-14 NOTE — Patient Instructions (Signed)
Follow up visit in 3 months. 

## 2019-01-16 ENCOUNTER — Encounter: Payer: Self-pay | Admitting: Radiation Oncology

## 2019-01-16 ENCOUNTER — Ambulatory Visit
Admission: RE | Admit: 2019-01-16 | Discharge: 2019-01-16 | Disposition: A | Discharge status: Home / Self Care | Payer: Medicare HMO | Source: Ambulatory Visit | Attending: Radiation Oncology | Admitting: Radiation Oncology

## 2019-01-16 ENCOUNTER — Other Ambulatory Visit: Payer: Self-pay

## 2019-01-16 VITALS — BP 130/84 | HR 60 | Temp 98.7°F | Resp 18 | Ht 66.0 in | Wt 102.1 lb

## 2019-01-16 DIAGNOSIS — Z7984 Long term (current) use of oral hypoglycemic drugs: Secondary | ICD-10-CM | POA: Insufficient documentation

## 2019-01-16 DIAGNOSIS — D0511 Intraductal carcinoma in situ of right breast: Secondary | ICD-10-CM

## 2019-01-16 DIAGNOSIS — R630 Anorexia: Secondary | ICD-10-CM | POA: Diagnosis not present

## 2019-01-16 DIAGNOSIS — Z08 Encounter for follow-up examination after completed treatment for malignant neoplasm: Secondary | ICD-10-CM | POA: Diagnosis not present

## 2019-01-16 DIAGNOSIS — Z923 Personal history of irradiation: Secondary | ICD-10-CM | POA: Insufficient documentation

## 2019-01-16 DIAGNOSIS — Z86000 Personal history of in-situ neoplasm of breast: Secondary | ICD-10-CM | POA: Diagnosis not present

## 2019-01-16 DIAGNOSIS — R439 Unspecified disturbances of smell and taste: Secondary | ICD-10-CM | POA: Diagnosis not present

## 2019-01-16 DIAGNOSIS — Z79811 Long term (current) use of aromatase inhibitors: Secondary | ICD-10-CM | POA: Diagnosis not present

## 2019-01-16 DIAGNOSIS — R432 Parageusia: Secondary | ICD-10-CM | POA: Diagnosis not present

## 2019-01-16 DIAGNOSIS — Z79899 Other long term (current) drug therapy: Secondary | ICD-10-CM | POA: Diagnosis not present

## 2019-01-16 DIAGNOSIS — R921 Mammographic calcification found on diagnostic imaging of breast: Secondary | ICD-10-CM | POA: Diagnosis not present

## 2019-01-16 DIAGNOSIS — Z171 Estrogen receptor negative status [ER-]: Secondary | ICD-10-CM | POA: Diagnosis not present

## 2019-01-16 NOTE — Patient Instructions (Signed)
Coronavirus (COVID-19) Are you at risk?  Are you at risk for the Coronavirus (COVID-19)?  To be considered HIGH RISK for Coronavirus (COVID-19), you have to meet the following criteria:  . Traveled to [place], [place], [place], [place] or [place]; or in the [place] to [place], [place], [place], or [place]; and have fever, cough, and shortness of breath within the last 2 weeks of travel OR . Been in close contact with a person diagnosed with COVID-19 within the last 2 weeks and have fever, cough, and shortness of breath . IF YOU DO NOT MEET THESE CRITERIA, YOU ARE CONSIDERED LOW RISK FOR COVID-19.  What to do if you are HIGH RISK for COVID-19?  . If you are having a medical emergency, call 911. . Seek medical care right away. Before you go to a doctor's office, urgent care or emergency department, call ahead and tell them about your recent travel, contact with someone diagnosed with COVID-19, and your symptoms. You should receive instructions from your physician's office regarding next steps of care.  . When you arrive at healthcare provider, tell the healthcare staff immediately you have returned from visiting [place], [place], [place], [place] or [place]; or traveled in the [place] to [place], [place], [place], or [place]; in the last two weeks or you have been in close contact with a person diagnosed with COVID-19 in the last 2 weeks.   . Tell the health care staff about your symptoms: fever, cough and shortness of breath. . After you have been seen by a medical provider, you will be either: o Tested for (COVID-19) and discharged home on quarantine except to seek medical care if symptoms worsen, and asked to  - Stay home and avoid contact with others until you get your results (4-5 days)  - Avoid travel on public transportation if possible (such as bus, train, or airplane) or o Sent to the Emergency Department by EMS for evaluation, COVID-19 testing, and possible  admission depending on your condition and test results.  What to do if you are LOW RISK for COVID-19?  Reduce your risk of any infection by using the same precautions used for avoiding the common cold or flu:  . Wash your hands often with soap and warm water for at least 20 seconds.  If soap and water are not readily available, use an alcohol-based hand sanitizer with at least 60% alcohol.  . If coughing or sneezing, cover your mouth and nose by coughing or sneezing into the elbow areas of your shirt or coat, into a tissue or into your sleeve (not your hands). . Avoid shaking hands with others and consider head nods or verbal greetings only. . Avoid touching your eyes, nose, or mouth with unwashed hands.  . Avoid close contact with people who are sick. . Avoid places or events with large numbers of people in one location, like concerts or sporting events. . Carefully consider travel plans you have or are making. . If you are planning any travel outside or inside the [place], visit the CDC's Travelers' Health webpage for the latest health notices. . If you have some symptoms but not all symptoms, continue to monitor at home and seek medical attention if your symptoms worsen. . If you are having a medical emergency, call 911.   ADDITIONAL HEALTHCARE OPTIONS FOR PATIENTS  [facility] / e-Visit: [email or web address]         [facility]: [phone]  [facility]: [phone]                   [facility]: [phone]   

## 2019-01-16 NOTE — Progress Notes (Signed)
Radiation Oncology         [phone] ________________________________  Name: Stacy [name] MRN: [id number]  Date: 01/16/2019  DOB: 01-24-1941  Follow-Up Visit Note  CC: Binnie Rail, MD  Armandina Gemma, MD    ICD-10-CM   1. Ductal carcinoma in situ (DCIS) of right breast  D05.11     Diagnosis:   78 y.o. female with High-grade, DCIS of right breast, ER/PR (neg) Pathological stage:pTis, pNX  Interval Since Last Radiation:  1 year 2 months  Radiation treatment dates:   10/24/2017-11/20/2017 Site/dose:   1. Right breast / 2.67 Gy x 15 fractions for a total dose of 40.05 Gy 2. Boost / 2 Gy x 5 fractions, for a total dose of 10 Gy  Narrative:  The patient returns today for routine follow-up.  Follow-up mammogram from 01/14/2019 showed stable post lumpectomy changes in the right breast. This scan re-demonstrated the stable scattered punctate calcifications within the right breast, for which follow-up bilateral diagnostic mammogram in 6 months is recommended.                  On review of systems, the patient denies any pain. She reports loss of taste and weight loss with onset approximately 3 months ago.   She has seen her endocrinologist and primary care physician concerning these issues.  She misses not being able to socialize with her friends and family, may be contributing to her poor appetite  ALLERGIES:  is allergic to penicillins; latex; adhesive [tape]; codeine; metronidazole; and other.  Meds: Current Outpatient Medications  Medication Sig Dispense Refill   acetaminophen (TYLENOL) 500 MG tablet Take 500 mg by mouth every 6 (six) hours as needed (for pain.).     Alcohol Swabs (B-D SINGLE USE SWABS REGULAR) PADS USE TWICE DAILY 200 each 2   atorvastatin (LIPITOR) 80 MG tablet TAKE 1 TABLET EVERY DAY 90 tablet 1   Biotin 10000 MCG TABS Take 10,000 mcg by mouth daily.     busPIRone (BUSPAR) 15 MG tablet Take 15 mg by mouth daily.      CALCIUM CITRATE-VITAMIN D PO  Take 1 tablet by mouth daily.      dabigatran (PRADAXA) 150 MG CAPS capsule Take 1 capsule (150 mg total) by mouth 2 (two) times daily. 180 capsule 3   donepezil (ARICEPT) 10 MG tablet TAKE 1 TABLET AT BEDTIME 90 tablet 1   ENSURE (ENSURE) Take 237 mLs by mouth 2 (two) times daily between meals. 42660 mL 3   FLUoxetine (PROZAC) 20 MG tablet fluoxetine 20 mg capsule     glucose blood (ACCU-CHEK AVIVA PLUS) test strip Use to check blood sugars 2 times daily. E11.9 180 each 5   hydrocortisone (ANUSOL-HC) 2.5 % rectal cream Place 1 application rectally 2 (two) times daily. 30 g 5   ketoconazole (NIZORAL) 2 % cream Apply 1 application topically daily. 60 g 5   ketotifen (ZADITOR) 0.025 % ophthalmic solution Place 1 drop into both eyes as needed.     lithium carbonate (LITHOBID) 300 MG CR tablet lithium carbonate ER 300 mg tablet,extended release     loratadine (CLARITIN) 10 MG tablet Take 10 mg by mouth daily.     Melatonin 5 MG TABS Take 1 tablet by mouth daily.      metFORMIN (GLUCOPHAGE-XR) 500 MG 24 hr tablet TAKE 1 TABLET EVERY DAY WITH BREAKFAST 90 tablet 1   montelukast (SINGULAIR) 10 MG tablet Take 1 tablet (10 mg total) by mouth at bedtime. Dent  tablet 3   Multiple Vitamin (MULTIVITAMIN WITH MINERALS) TABS tablet Take 1 tablet by mouth daily.      non-metallic deodorant (ALRA) MISC Apply 1 application topically daily as needed.     omeprazole (PRILOSEC) 40 MG capsule TAKE 1 CAPSULE (40 MG) BY MOUTH ON SUNDAY, MONDAY, WEDNESDAY,  FRIDAY (FOUR TIMES WEEKLY) 52 capsule 1   oxybutynin (DITROPAN) 5 MG tablet TAKE 1 TABLET TWICE DAILY 180 tablet 1   Polyethyl Glycol-Propyl Glycol (SYSTANE ULTRA) 0.4-0.3 % SOLN Place 1-2 drops into both eyes 3 (three) times daily as needed (for dry/irritated eyes.).     PRODIGY TWIST TOP LANCETS 28G MISC CHECK BLOOD SUGAR EVERY DAY 100 each 1   Pyridoxine HCl (VITAMIN B-6) 500 MG tablet Take 500 mg by mouth 2 (two) times daily.     SYNTHROID 112  MCG tablet Take 1 tablet 112 mcg Levothyroxine 5 days a week and 1.5 tablets 2 days a week. 105 tablet 2   White Petrolatum-Mineral Oil (SYSTANE NIGHTTIME) OINT Apply to eye.     No current facility-administered medications for this encounter.     Physical Findings: The patient is in no acute distress. Patient is alert and oriented.  height is 5\' 6"  (1.676 m) and weight is 102 lb 2 oz (46.3 kg). Her temporal temperature is 98.7 F (37.1 C). Her blood pressure is 130/84 and her pulse is 60. Her respiration is 18 and oxygen saturation is 99%.   Lungs are clear to auscultation bilaterally. Heart has regular rate and rhythm. No palpable cervical, supraclavicular, or axillary adenopathy. Abdomen soft, non-tender, normal bowel sounds.  Breast Exam Left Breast: No palpable mass, nipple discharge or bleeding. Patient has a pacemaker in the left upper chest.  Right Breast: No palpable mass, nipple discharge or bleeding. Minimal radiation changes.   Lab Findings: Lab Results  Component Value Date   WBC 7.4 08/16/2018   HGB 13.7 08/16/2018   HCT 40.2 08/16/2018   MCV 95 08/16/2018   PLT 187 08/16/2018    Radiographic Findings: Mm Diag Breast Tomo Uni Right  Addendum Date: 01/14/2019   ADDENDUM REPORT: 01/14/2019 15:23 ADDENDUM: Recommendation: Bilateral diagnostic mammogram in 6 months. Electronically Signed   By: Fidela Salisbury M.D.   On: 01/14/2019 15:23   Result Date: 01/14/2019 CLINICAL DATA:  History of treated right breast ductal carcinoma in situ, post lumpectomy and radiation therapy in 2019.Scattered punctate probably benign calcifications were noted in the right breast on diagnostic imaging dated March 11, 2018, for which the patient was recommended short-term follow-up. EXAM: DIGITAL DIAGNOSTIC UNILATERAL RIGHT MAMMOGRAM WITH CAD AND TOMO COMPARISON:  Previous exam(s). ACR Breast Density Category c: The breast tissue is heterogeneously dense, which may obscure small masses.  FINDINGS: Mammographically, there are no new masses, areas of nonsurgical architectural distortion or new clustered calcifications. Again seen are scattered punctate probably benign calcifications within the right breast, anterior to the postsurgical changes from prior lumpectomy. Mammographic images were processed with CAD. IMPRESSION: Stable post lumpectomy changes in the right breast. Stable scattered punctate calcifications within the right breast, for which diagnostic follow-up in 1 year is recommended. RECOMMENDATION: Diagnostic mammogram is suggested in 1 year. (Code:[id number]) I have discussed the findings and recommendations with the patient. Results were also provided in writing at the conclusion of the visit. If applicable, a reminder letter will be sent to the patient regarding the next appointment. BI-RADS CATEGORY  3: Probably benign. Electronically Signed: By: Fidela Salisbury M.D. On: 01/14/2019 12:09  Impression:  High-grade, DCIS of right breast, ER/PR (neg) Pathological stage:pTis, pNX. No evidence of recurrence on physical exam or recent mammography. Radiology has recommended mammography on this patient every 6 months.   Plan:  Routine follow-up in 6 months after the patient's next mammogram.  She is not following up with medical oncology or surgery.  ____________________________________  Blair Promise, PhD, MD  This document serves as a record of services personally performed by Gery Pray, MD. It was created on his behalf by Rae Lips, a trained medical scribe. The creation of this record is based on the scribe's personal observations and the provider's statements to them. This document has been checked and approved by the attending provider.

## 2019-01-16 NOTE — Progress Notes (Signed)
Pt presents today for f/u with Dr. Sondra Come. Pt reports she had mammogram day before yesterday. Pt denies c/o pain. Pt reports loss of taste and weight loss with onset approximately 3 months ago.   BP 130/84 (BP Location: Left Arm, Patient Position: Sitting)   Pulse 60   Temp 98.7 F (37.1 C) (Temporal)   Resp 18   Ht 5\' 6"  (1.676 m)   Wt 102 lb 2 oz (46.3 kg)   SpO2 99%   BMI 16.48 kg/m   Wt Readings from Last 3 Encounters:  01/16/19 102 lb 2 oz (46.3 kg)  01/14/19 104 lb (47.2 kg)  12/17/18 104 lb 12.8 oz (47.5 kg)   Loma Sousa, RN BSN

## 2019-01-17 LAB — CBC WITH DIFFERENTIAL/PLATELET
Absolute Monocytes: 462 cells/uL (ref 200–950)
Basophils Absolute: 79 cells/uL (ref 0–200)
Basophils Relative: 1.2 %
Eosinophils Absolute: 132 cells/uL (ref 15–500)
Eosinophils Relative: 2 %
HCT: 43.1 % (ref 35.0–45.0)
Hemoglobin: 14.6 g/dL (ref 11.7–15.5)
Lymphs Abs: 1393 cells/uL (ref 850–3900)
MCH: 32.5 pg (ref 27.0–33.0)
MCHC: 33.9 g/dL (ref 32.0–36.0)
MCV: 96 fL (ref 80.0–100.0)
MPV: 10.3 fL (ref 7.5–12.5)
Monocytes Relative: 7 %
Neutro Abs: 4534 cells/uL (ref 1500–7800)
Neutrophils Relative %: 68.7 %
Platelets: 186 10*3/uL (ref 140–400)
RBC: 4.49 10*6/uL (ref 3.80–5.10)
RDW: 12.3 % (ref 11.0–15.0)
Total Lymphocyte: 21.1 %
WBC: 6.6 10*3/uL (ref 3.8–10.8)

## 2019-01-17 LAB — IRON: Iron: 72 ug/dL (ref 45–160)

## 2019-01-17 LAB — VITAMIN B12: Vitamin B-12: 1494 pg/mL — ABNORMAL HIGH (ref 200–1100)

## 2019-01-20 LAB — VITAMIN D 1,25 DIHYDROXY
Vitamin D 1, 25 (OH)2 Total: 27 pg/mL (ref 18–72)
Vitamin D2 1, 25 (OH)2: <8 pg/mL
Vitamin D3 1, 25 (OH)2: 27 pg/mL

## 2019-01-20 LAB — VITAMIN B6: Vitamin B6: 103.8 ng/mL — ABNORMAL HIGH (ref 2.1–21.7)

## 2019-01-20 LAB — ANGIOTENSIN CONVERTING ENZYME: Angiotensin-Converting Enzyme: 29 U/L (ref 9–67)

## 2019-01-22 ENCOUNTER — Other Ambulatory Visit (INDEPENDENT_AMBULATORY_CARE_PROVIDER_SITE_OTHER): Payer: Self-pay | Admitting: *Deleted

## 2019-01-22 ENCOUNTER — Telehealth (INDEPENDENT_AMBULATORY_CARE_PROVIDER_SITE_OTHER): Payer: Self-pay | Admitting: "Endocrinology

## 2019-01-22 ENCOUNTER — Other Ambulatory Visit: Payer: Self-pay | Admitting: Internal Medicine

## 2019-01-22 DIAGNOSIS — Z853 Personal history of malignant neoplasm of breast: Secondary | ICD-10-CM

## 2019-01-22 DIAGNOSIS — E210 Primary hyperparathyroidism: Secondary | ICD-10-CM

## 2019-01-22 NOTE — Telephone Encounter (Signed)
LVM, advised labs are in the portal to be done.

## 2019-01-22 NOTE — Telephone Encounter (Signed)
°  Who's calling (name and relationship to patient) : [name] - Daughter (POA)   Best contact number: [phone]  Provider they see: Dr Tobe Sos   Reason for call: Daughter called to make sure that they will need to have labs drawn for [facility] before her 3 month follow up on 04/16/19. Please advise     PRESCRIPTION REFILL ONLY  Name of prescription:  Pharmacy:

## 2019-01-23 ENCOUNTER — Other Ambulatory Visit: Payer: Self-pay | Admitting: *Deleted

## 2019-01-23 MED ORDER — HYDROCORTISONE (PERIANAL) 2.5 % EX CREA: 1.0000 "application " | TOPICAL_CREAM | Freq: Two times a day (BID) | CUTANEOUS | 0 refills | Status: DC

## 2019-01-31 IMAGING — MG 2D DIGITAL SCREENING BILATERAL MAMMOGRAM WITH CAD AND ADJUNCT TO
9 of 13 series · 9 of 29 positions shown · non-contrast
Comparison: Previous exam(s).

CLINICAL DATA: Screening.

EXAM:
2D DIGITAL SCREENING BILATERAL MAMMOGRAM WITH CAD AND ADJUNCT TOMO

[R CC (1 of 2)]
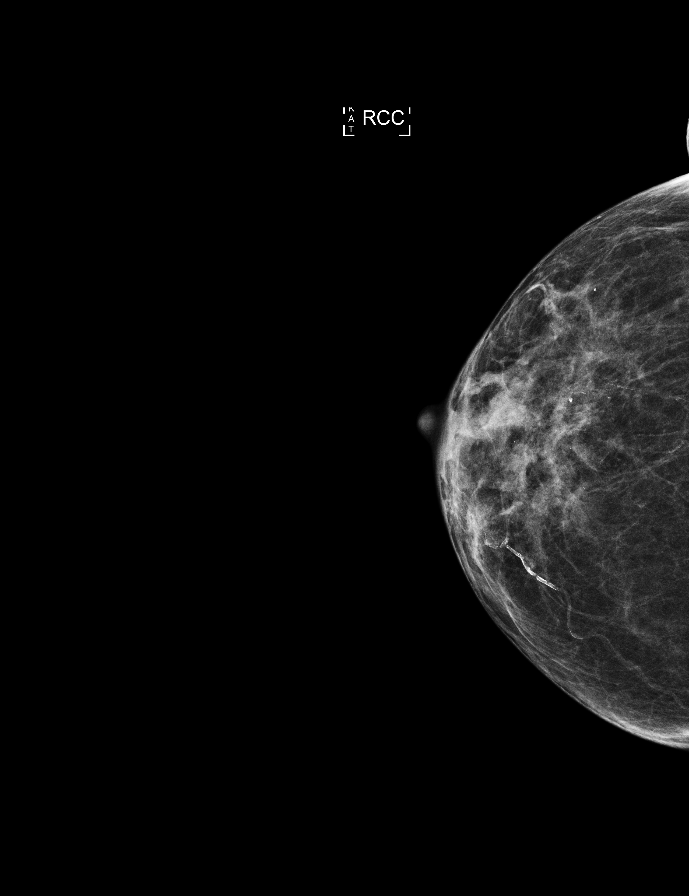

[R MLO]
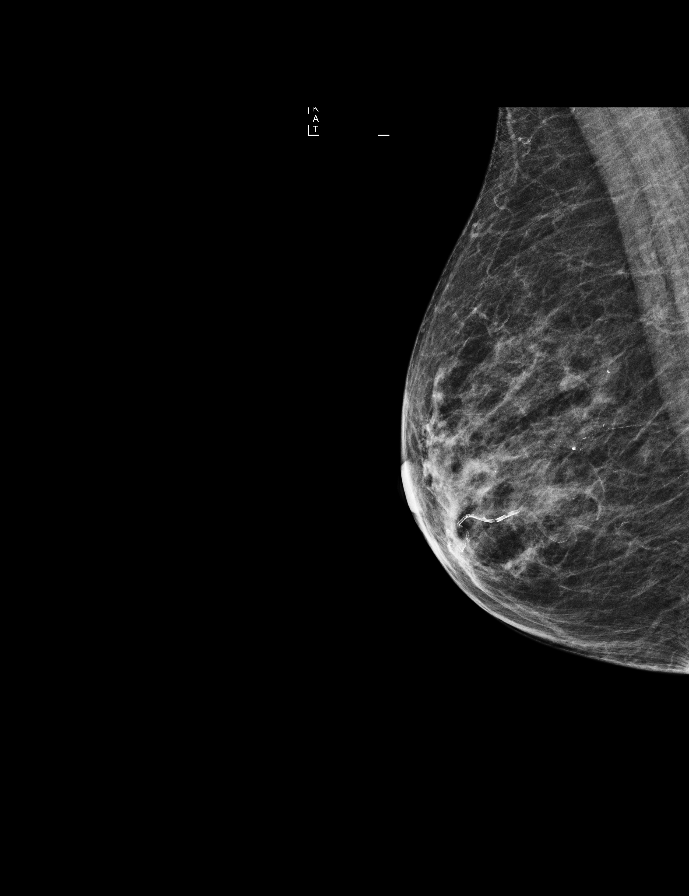

[L MLO]
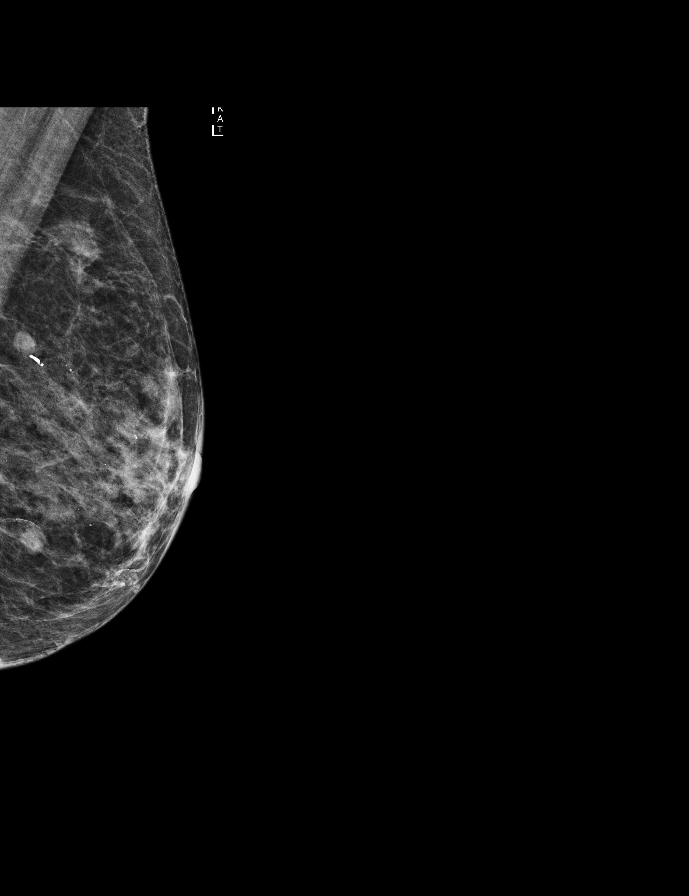

[R CC (2 of 2)]
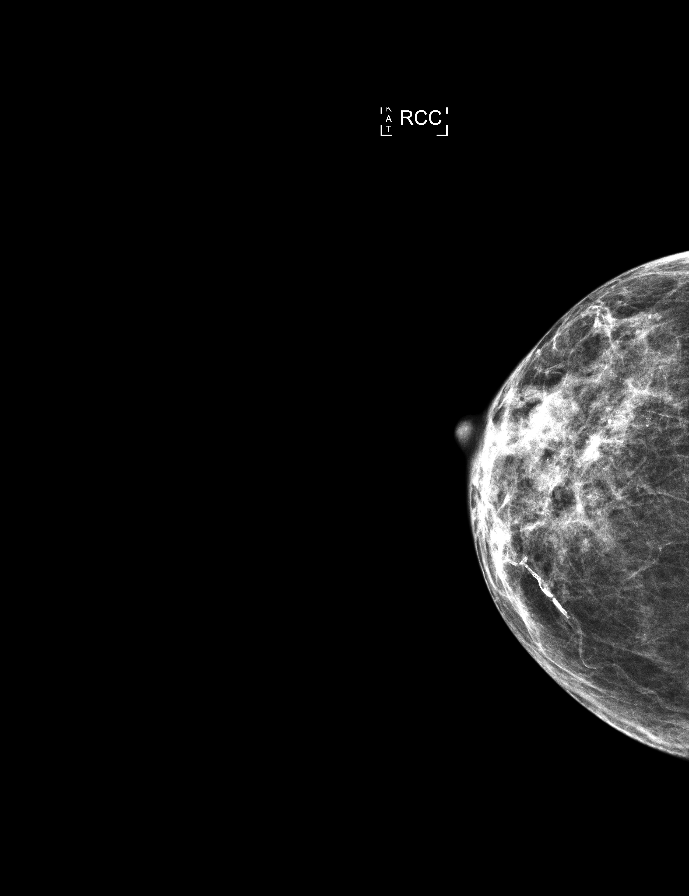

[L MLO synth-2D]
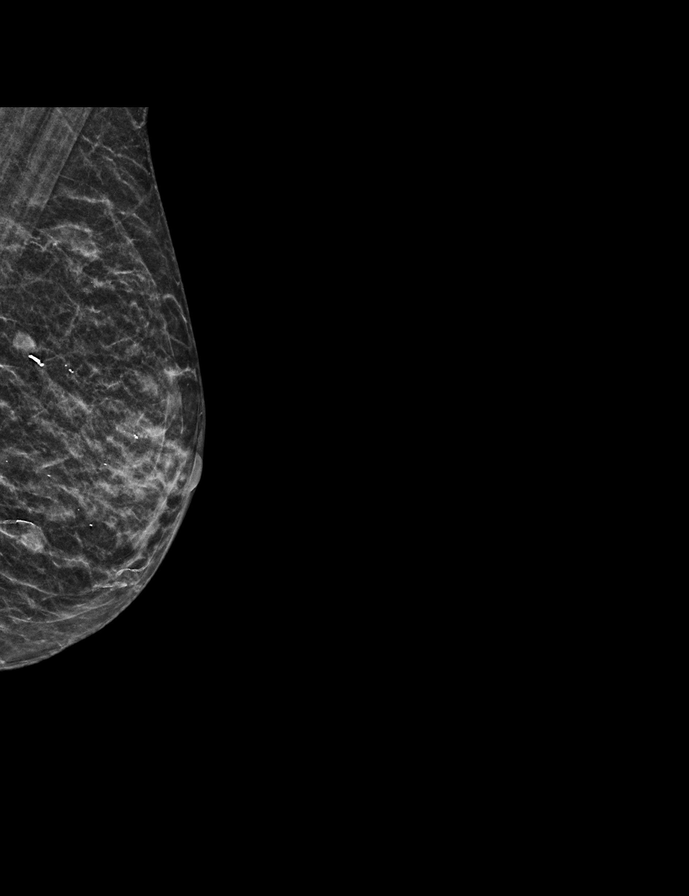

[L CC]
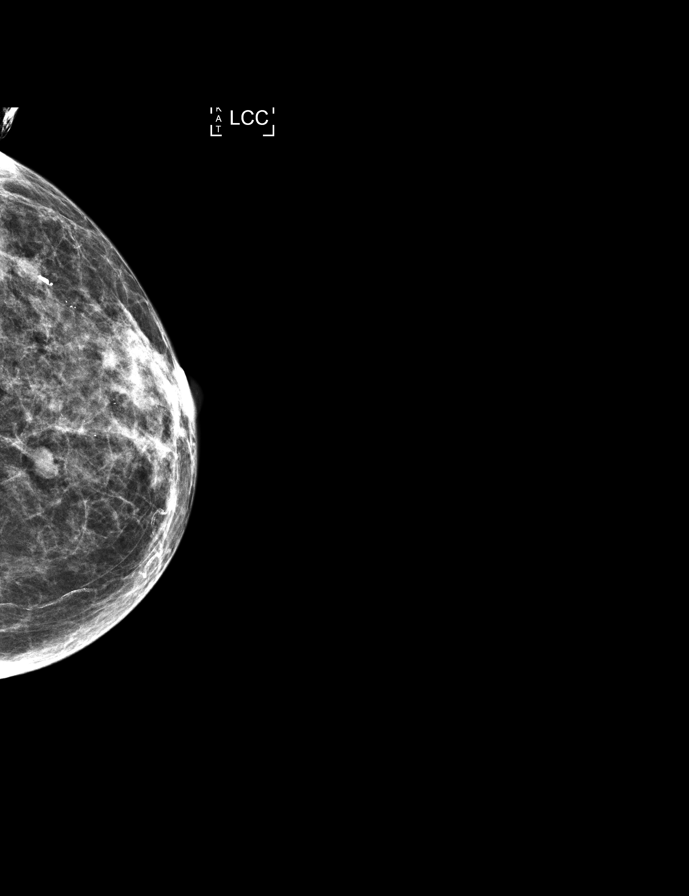

[R MLO synth-2D]
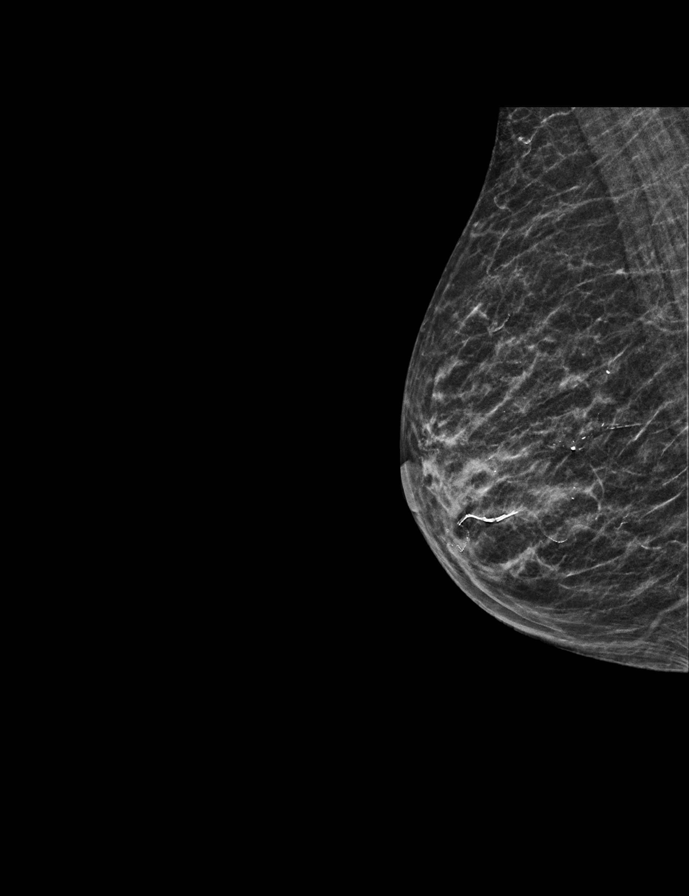

[L CC synth-2D]
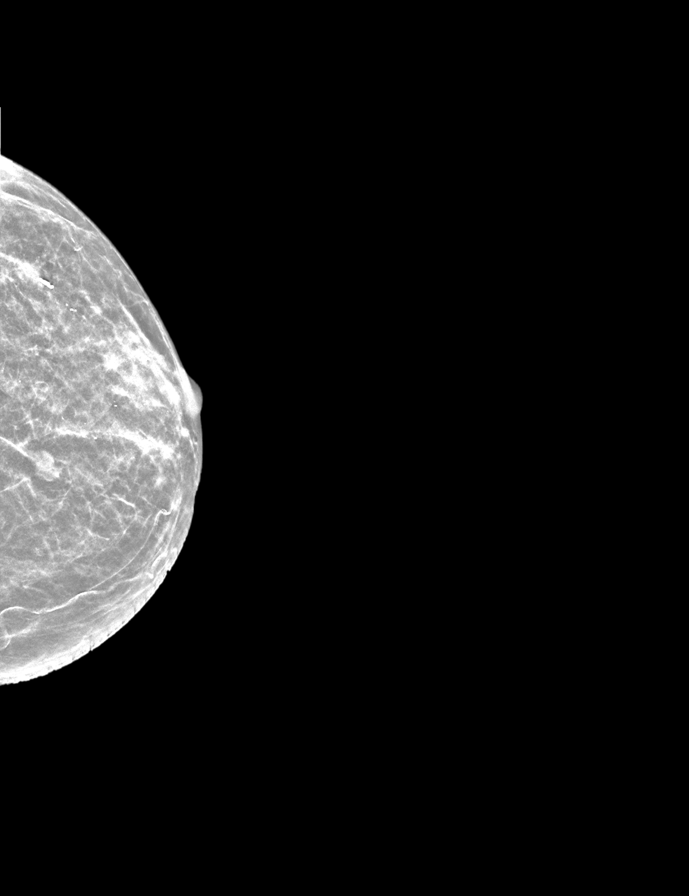

[R CC synth-2D]
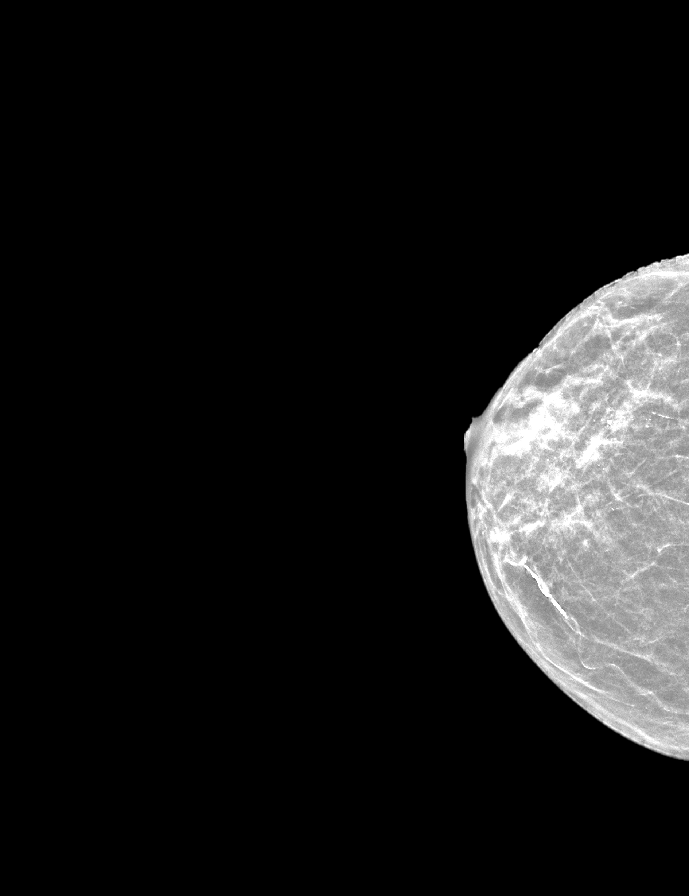

[9 of 29 positions shown; findings below may reference images not displayed]

ACR Breast Density Category c: The breast tissue is heterogeneously
dense, which may obscure small masses.
FINDINGS: In the right breast microcalcifications requires further evaluation.

In the left breast a possible asymmetry requires further evaluation.

Images were processed with CAD.
IMPRESSION: Further evaluation is suggested for possible microcalcifications in
the right breast.

Further evaluation is suggested for possible asymmetry in the left
breast.

RECOMMENDATION:
Diagnostic mammogram and possibly ultrasound of both breasts.
(Code:[id number])

The patient will be contacted regarding the findings, and additional
imaging will be scheduled.

BI-RADS CATEGORY  0: Incomplete. Need additional imaging evaluation
and/or prior mammograms for comparison.

## 2019-02-10 ENCOUNTER — Ambulatory Visit (INDEPENDENT_AMBULATORY_CARE_PROVIDER_SITE_OTHER): Payer: Medicare HMO | Admitting: *Deleted

## 2019-02-10 DIAGNOSIS — I442 Atrioventricular block, complete: Secondary | ICD-10-CM | POA: Diagnosis not present

## 2019-02-11 ENCOUNTER — Telehealth: Payer: Self-pay

## 2019-02-11 NOTE — Telephone Encounter (Signed)
Spoke with patient to remind of missed remote transmission 

## 2019-02-12 ENCOUNTER — Encounter: Payer: Medicare HMO | Admitting: Internal Medicine

## 2019-02-12 LAB — CUP PACEART REMOTE DEVICE CHECK
Battery Impedance: 206 Ohm
Battery Remaining Longevity: 135 mo
Battery Voltage: 2.79 V
Brady Statistic AP VP Percent: 7 %
Brady Statistic AP VS Percent: 36 %
Brady Statistic AS VP Percent: 0 %
Brady Statistic AS VS Percent: 56 %
Date Time Interrogation Session: 20200707163458
Implantable Lead Implant Date: 20010521
Implantable Lead Implant Date: 20010521
Implantable Lead Location: 753859
Implantable Lead Location: 753860
Implantable Lead Model: 4592
Implantable Lead Model: 6940
Implantable Pulse Generator Implant Date: 20161229
Lead Channel Impedance Value: 1084 Ohm
Lead Channel Impedance Value: 599 Ohm
Lead Channel Pacing Threshold Amplitude: 0.5 V
Lead Channel Pacing Threshold Amplitude: 2 V
Lead Channel Pacing Threshold Pulse Width: 0.4 ms
Lead Channel Pacing Threshold Pulse Width: 0.4 ms
Lead Channel Setting Pacing Amplitude: 2 V
Lead Channel Setting Pacing Amplitude: 4 V
Lead Channel Setting Pacing Pulse Width: 0.4 ms
Lead Channel Setting Sensing Sensitivity: 4 mV

## 2019-02-14 ENCOUNTER — Other Ambulatory Visit: Payer: Self-pay | Admitting: Internal Medicine

## 2019-02-17 ENCOUNTER — Encounter: Payer: Self-pay | Admitting: Cardiology

## 2019-02-17 NOTE — Progress Notes (Signed)
Remote pacemaker transmission.   

## 2019-02-24 ENCOUNTER — Telehealth: Payer: Self-pay | Admitting: Internal Medicine
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
## 2019-02-24 NOTE — Telephone Encounter (Signed)

## 2019-02-25 ENCOUNTER — Encounter: Payer: Self-pay | Admitting: Internal Medicine

## 2019-02-25 ENCOUNTER — Ambulatory Visit (INDEPENDENT_AMBULATORY_CARE_PROVIDER_SITE_OTHER): Payer: Medicare HMO | Admitting: Internal Medicine

## 2019-02-25 ENCOUNTER — Other Ambulatory Visit: Payer: Self-pay

## 2019-02-25 VITALS — BP 102/70 | HR 64 | Ht 66.0 in | Wt 102.2 lb

## 2019-02-25 DIAGNOSIS — I480 Paroxysmal atrial fibrillation: Secondary | ICD-10-CM

## 2019-02-25 DIAGNOSIS — I442 Atrioventricular block, complete: Secondary | ICD-10-CM | POA: Diagnosis not present

## 2019-02-25 DIAGNOSIS — Z950 Presence of cardiac pacemaker: Secondary | ICD-10-CM

## 2019-02-25 DIAGNOSIS — F3174 Bipolar disorder, in full remission, most recent episode manic: Secondary | ICD-10-CM | POA: Diagnosis not present

## 2019-02-25 LAB — CUP PACEART INCLINIC DEVICE CHECK
Battery Impedance: 206 Ohm
Battery Remaining Longevity: 140 mo
Battery Voltage: 2.79 V
Brady Statistic AP VP Percent: 7 %
Brady Statistic AP VS Percent: 36 %
Brady Statistic AS VP Percent: 0 %
Brady Statistic AS VS Percent: 56 %
Date Time Interrogation Session: 20200721173717
Implantable Lead Implant Date: 20010521
Implantable Lead Implant Date: 20010521
Implantable Lead Location: 753859
Implantable Lead Location: 753860
Implantable Lead Model: 4592
Implantable Lead Model: 6940
Implantable Pulse Generator Implant Date: 20161229
Lead Channel Impedance Value: 1066 Ohm
Lead Channel Impedance Value: 608 Ohm
Lead Channel Pacing Threshold Amplitude: 0.5 V
Lead Channel Pacing Threshold Amplitude: 1.25 V
Lead Channel Pacing Threshold Amplitude: 1.375 V
Lead Channel Pacing Threshold Pulse Width: 0.4 ms
Lead Channel Pacing Threshold Pulse Width: 0.4 ms
Lead Channel Pacing Threshold Pulse Width: 0.4 ms
Lead Channel Sensing Intrinsic Amplitude: 11.2 mV
Lead Channel Sensing Intrinsic Amplitude: 4 mV
Lead Channel Setting Pacing Amplitude: 2 V
Lead Channel Setting Pacing Amplitude: 2.75 V
Lead Channel Setting Pacing Pulse Width: 0.4 ms
Lead Channel Setting Sensing Sensitivity: 4 mV

## 2019-02-25 NOTE — Patient Instructions (Signed)

## 2019-03-07 ENCOUNTER — Other Ambulatory Visit: Payer: Self-pay | Admitting: Internal Medicine

## 2019-03-26 ENCOUNTER — Other Ambulatory Visit: Payer: Self-pay | Admitting: Internal Medicine

## 2019-04-09 DIAGNOSIS — E210 Primary hyperparathyroidism: Secondary | ICD-10-CM | POA: Diagnosis not present

## 2019-04-09 DIAGNOSIS — E119 Type 2 diabetes mellitus without complications: Secondary | ICD-10-CM | POA: Diagnosis not present

## 2019-04-10 LAB — TSH: TSH: 2.41 mIU/L (ref 0.40–4.50)

## 2019-04-10 LAB — T3, FREE: T3, Free: 2.5 pg/mL (ref 2.3–4.2)

## 2019-04-10 LAB — HEMOGLOBIN A1C
Hgb A1c MFr Bld: 5.9 % of total Hgb — ABNORMAL HIGH (ref <5.7)
Mean Plasma Glucose: 123 (calc)
eAG (mmol/L): 6.8 (calc)

## 2019-04-10 LAB — T4, FREE: Free T4: 1.3 ng/dL (ref 0.8–1.8)

## 2019-04-16 ENCOUNTER — Encounter (INDEPENDENT_AMBULATORY_CARE_PROVIDER_SITE_OTHER): Payer: Self-pay | Admitting: "Endocrinology

## 2019-04-16 ENCOUNTER — Other Ambulatory Visit: Payer: Self-pay

## 2019-04-16 ENCOUNTER — Ambulatory Visit (INDEPENDENT_AMBULATORY_CARE_PROVIDER_SITE_OTHER): Payer: Medicare HMO | Admitting: "Endocrinology

## 2019-04-16 VITALS — BP 132/74 | HR 60 | Ht 66.0 in | Wt 102.0 lb

## 2019-04-16 DIAGNOSIS — D692 Other nonthrombocytopenic purpura: Secondary | ICD-10-CM | POA: Diagnosis not present

## 2019-04-16 DIAGNOSIS — E063 Autoimmune thyroiditis: Secondary | ICD-10-CM

## 2019-04-16 DIAGNOSIS — Z85828 Personal history of other malignant neoplasm of skin: Secondary | ICD-10-CM | POA: Diagnosis not present

## 2019-04-16 DIAGNOSIS — R7303 Prediabetes: Secondary | ICD-10-CM

## 2019-04-16 DIAGNOSIS — Z720 Tobacco use: Secondary | ICD-10-CM

## 2019-04-16 DIAGNOSIS — D225 Melanocytic nevi of trunk: Secondary | ICD-10-CM | POA: Diagnosis not present

## 2019-04-16 DIAGNOSIS — L821 Other seborrheic keratosis: Secondary | ICD-10-CM | POA: Diagnosis not present

## 2019-04-16 DIAGNOSIS — E7800 Pure hypercholesterolemia, unspecified: Secondary | ICD-10-CM

## 2019-04-16 DIAGNOSIS — I10 Essential (primary) hypertension: Secondary | ICD-10-CM | POA: Diagnosis not present

## 2019-04-16 DIAGNOSIS — E559 Vitamin D deficiency, unspecified: Secondary | ICD-10-CM | POA: Diagnosis not present

## 2019-04-16 DIAGNOSIS — R259 Unspecified abnormal involuntary movements: Secondary | ICD-10-CM

## 2019-04-16 DIAGNOSIS — E049 Nontoxic goiter, unspecified: Secondary | ICD-10-CM

## 2019-04-16 DIAGNOSIS — R5383 Other fatigue: Secondary | ICD-10-CM

## 2019-04-16 DIAGNOSIS — D1801 Hemangioma of skin and subcutaneous tissue: Secondary | ICD-10-CM | POA: Diagnosis not present

## 2019-04-16 DIAGNOSIS — E211 Secondary hyperparathyroidism, not elsewhere classified: Secondary | ICD-10-CM | POA: Diagnosis not present

## 2019-04-16 NOTE — Patient Instructions (Signed)
Follow up visit in 3 months. 
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
## 2019-04-22 LAB — PTH, INTACT AND CALCIUM
Calcium: 10.9 mg/dL — ABNORMAL HIGH (ref 8.6–10.4)
PTH: 24 pg/mL (ref 14–64)

## 2019-04-22 LAB — COMPREHENSIVE METABOLIC PANEL
AG Ratio: 1.7 (calc) (ref 1.0–2.5)
ALT: 19 U/L (ref 6–29)
AST: 21 U/L (ref 10–35)
Albumin: 4.3 g/dL (ref 3.6–5.1)
Alkaline phosphatase (APISO): 57 U/L (ref 37–153)
BUN/Creatinine Ratio: 20 (calc) (ref 6–22)
BUN: 22 mg/dL (ref 7–25)
CO2: 29 mmol/L (ref 20–32)
Calcium: 10.9 mg/dL — ABNORMAL HIGH (ref 8.6–10.4)
Chloride: 104 mmol/L (ref 98–110)
Creat: 1.09 mg/dL — ABNORMAL HIGH (ref 0.60–0.93)
Globulin: 2.6 g/dL (calc) (ref 1.9–3.7)
Glucose, Bld: 78 mg/dL (ref 65–99)
Potassium: 4.6 mmol/L (ref 3.5–5.3)
Sodium: 140 mmol/L (ref 135–146)
Total Bilirubin: 0.4 mg/dL (ref 0.2–1.2)
Total Protein: 6.9 g/dL (ref 6.1–8.1)

## 2019-04-22 LAB — PTH-RELATED PEPTIDE: PTH-Related Protein (PTH-RP): 11 pg/mL — ABNORMAL LOW (ref 14–27)

## 2019-04-22 LAB — VITAMIN D 25 HYDROXY (VIT D DEFICIENCY, FRACTURES): Vit D, 25-Hydroxy: 53 ng/mL (ref 30–100)

## 2019-04-29 ENCOUNTER — Telehealth: Payer: Self-pay | Admitting: Internal Medicine

## 2019-04-29 ENCOUNTER — Encounter (INDEPENDENT_AMBULATORY_CARE_PROVIDER_SITE_OTHER): Payer: Self-pay

## 2019-04-29 NOTE — Telephone Encounter (Signed)
New Message   Patient is calling because she has a pacemaker and wants to know if its okay to use a Sonic Toothbrush. She states the pamphlet said she may need to consult with her cardiologist if she has a pacemaker. Please call.

## 2019-04-29 NOTE — Telephone Encounter (Signed)
[place] for patient to use Sonic toothbrush. To use with right hand and per industry no contraindications at this time.

## 2019-05-12 ENCOUNTER — Ambulatory Visit (INDEPENDENT_AMBULATORY_CARE_PROVIDER_SITE_OTHER): Payer: Medicare HMO | Admitting: *Deleted

## 2019-05-12 ENCOUNTER — Other Ambulatory Visit: Payer: Self-pay | Admitting: Internal Medicine

## 2019-05-12 DIAGNOSIS — I480 Paroxysmal atrial fibrillation: Secondary | ICD-10-CM | POA: Diagnosis not present

## 2019-05-13 LAB — CUP PACEART REMOTE DEVICE CHECK
Battery Impedance: 206 Ohm
Battery Remaining Longevity: 139 mo
Battery Voltage: 2.8 V
Brady Statistic AP VP Percent: 7 %
Brady Statistic AP VS Percent: 41 %
Brady Statistic AS VP Percent: 0 %
Brady Statistic AS VS Percent: 52 %
Date Time Interrogation Session: 20201005155509
Implantable Lead Implant Date: 20010521
Implantable Lead Implant Date: 20010521
Implantable Lead Location: 753859
Implantable Lead Location: 753860
Implantable Lead Model: 4592
Implantable Lead Model: 6940
Implantable Pulse Generator Implant Date: 20161229
Lead Channel Impedance Value: 1050 Ohm
Lead Channel Impedance Value: 630 Ohm
Lead Channel Pacing Threshold Amplitude: 0.5 V
Lead Channel Pacing Threshold Amplitude: 1.25 V
Lead Channel Pacing Threshold Pulse Width: 0.4 ms
Lead Channel Pacing Threshold Pulse Width: 0.4 ms
Lead Channel Setting Pacing Amplitude: 2 V
Lead Channel Setting Pacing Amplitude: 3 V
Lead Channel Setting Pacing Pulse Width: 0.4 ms
Lead Channel Setting Sensing Sensitivity: 4 mV

## 2019-05-19 NOTE — Progress Notes (Signed)
Remote pacemaker transmission.   

## 2019-05-22 ENCOUNTER — Encounter: Payer: Self-pay | Admitting: Internal Medicine

## 2019-05-26 NOTE — Telephone Encounter (Signed)
Patient's daughter has dropped forms off.  Placed in Brittany's box.

## 2019-05-26 NOTE — Telephone Encounter (Signed)
Forms have been faxed to [phone], Copy sent to scan &Charged for under daughter Stacy [name]). Original has been mailed to daughter as well.

## 2019-05-26 NOTE — Telephone Encounter (Signed)
Paperwork signed by Dr. Quay Burow and given back to Stacy [name].

## 2019-05-27 ENCOUNTER — Other Ambulatory Visit: Payer: Self-pay | Admitting: Internal Medicine

## 2019-06-02 ENCOUNTER — Other Ambulatory Visit: Payer: Self-pay | Admitting: Internal Medicine

## 2019-06-09 ENCOUNTER — Other Ambulatory Visit (INDEPENDENT_AMBULATORY_CARE_PROVIDER_SITE_OTHER): Payer: Self-pay | Admitting: "Endocrinology

## 2019-06-09 DIAGNOSIS — E210 Primary hyperparathyroidism: Secondary | ICD-10-CM

## 2019-06-09 DIAGNOSIS — E211 Secondary hyperparathyroidism, not elsewhere classified: Secondary | ICD-10-CM

## 2019-06-09 DIAGNOSIS — E063 Autoimmune thyroiditis: Secondary | ICD-10-CM

## 2019-06-09 DIAGNOSIS — E049 Nontoxic goiter, unspecified: Secondary | ICD-10-CM

## 2019-06-11 ENCOUNTER — Other Ambulatory Visit: Payer: Self-pay | Admitting: Internal Medicine

## 2019-06-13 DIAGNOSIS — M25562 Pain in left knee: Secondary | ICD-10-CM | POA: Diagnosis not present

## 2019-06-13 DIAGNOSIS — M1712 Unilateral primary osteoarthritis, left knee: Secondary | ICD-10-CM | POA: Diagnosis not present

## 2019-06-16 ENCOUNTER — Other Ambulatory Visit: Payer: Self-pay | Admitting: Internal Medicine

## 2019-06-24 ENCOUNTER — Other Ambulatory Visit: Payer: Self-pay

## 2019-06-24 MED ORDER — ACCU-CHEK AVIVA PLUS VI STRP: ORAL_STRIP | 1 refills | Status: DC

## 2019-07-05 ENCOUNTER — Encounter: Payer: Self-pay | Admitting: Emergency Medicine

## 2019-07-05 ENCOUNTER — Other Ambulatory Visit: Payer: Self-pay

## 2019-07-05 ENCOUNTER — Ambulatory Visit
Admission: EM | Admit: 2019-07-05 | Discharge: 2019-07-05 | Disposition: A | Discharge status: Home / Self Care | Payer: Medicare HMO | Attending: Physician Assistant | Admitting: Physician Assistant

## 2019-07-05 DIAGNOSIS — L03113 Cellulitis of right upper limb: Secondary | ICD-10-CM | POA: Diagnosis not present

## 2019-07-05 DIAGNOSIS — W5503XA Scratched by cat, initial encounter: Secondary | ICD-10-CM | POA: Diagnosis not present

## 2019-07-05 MED ORDER — MUPIROCIN 2 % EX OINT: 1.0000 "application " | TOPICAL_OINTMENT | Freq: Two times a day (BID) | CUTANEOUS | 0 refills | Status: DC

## 2019-07-05 MED ORDER — DOXYCYCLINE HYCLATE 100 MG PO CAPS: 100.0000 mg | ORAL_CAPSULE | Freq: Two times a day (BID) | ORAL | 0 refills | Status: DC
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
## 2019-07-05 NOTE — Discharge Instructions (Signed)
Start doxycycline as directed. Keep wound clean and dry. You can clean gently with soap and water. Do not soak area in water. Bactroban to dress wound as needed.  Monitor for spreading redness, increased warmth, increased swelling, fever, follow up for reevaluation needed. Otherwise, follow up  with PCP for recheck in 2 days.

## 2019-07-05 NOTE — ED Triage Notes (Signed)
Patient's cat, with current shot record, scratched her right wrist/back of hand.  Incident occurred yesterday afternoon.  Right hand is red, red streak right forearm.  Swelling and warm to touch present

## 2019-07-06 ENCOUNTER — Telehealth: Payer: Self-pay | Admitting: Physician Assistant

## 2019-07-06 ENCOUNTER — Encounter: Payer: Self-pay | Admitting: Internal Medicine

## 2019-07-06 NOTE — Telephone Encounter (Addendum)
8:25am Patient's daughter called back after clinic visit yesterday. Patient was started on doxycycline for cat scratch causing cellulitis with streaking up the forearm. Patient has had one dose of the doxycycline since visit. Noticed this morning with increased swelling to the wrist, and therefore called. Patient denies increased pain, spreading erythema. Still with good range of motion of the wrist.  At this time, will have daughter continue to monitor. Will have patient elevate hand for the next few hours, and will touch base with patient this afternoon. Return precautions given. Daughter expresses understanding and agrees to plan.  2pm Patient's daughter called back to inform clinical staff that swelling has improved some and no worsening of erythema/pain. They will continue to monitor wound and follow up as per previous plan with PCP tomorrow for continued monitoring and reevaluation needed.

## 2019-07-07 ENCOUNTER — Other Ambulatory Visit: Payer: Self-pay

## 2019-07-07 ENCOUNTER — Encounter: Payer: Self-pay | Admitting: Internal Medicine

## 2019-07-07 ENCOUNTER — Ambulatory Visit (INDEPENDENT_AMBULATORY_CARE_PROVIDER_SITE_OTHER): Payer: Medicare HMO | Admitting: Internal Medicine

## 2019-07-07 DIAGNOSIS — L03113 Cellulitis of right upper limb: Secondary | ICD-10-CM | POA: Insufficient documentation

## 2019-07-07 MED ORDER — DICLOFENAC SODIUM 1 % EX GEL: 4.0000 g | Freq: Four times a day (QID) | CUTANEOUS | 5 refills | Status: DC

## 2019-07-07 MED ORDER — SULFAMETHOXAZOLE-TRIMETHOPRIM 400-80 MG PO TABS: 1.0000 | ORAL_TABLET | Freq: Two times a day (BID) | ORAL | 0 refills | Status: DC
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
## 2019-07-07 NOTE — Assessment & Plan Note (Signed)
Secondary to a cat scratch that occurred 11/27 Seen in urgent care 11/28 and started on doxycycline Minimal improvement-still with discomfort, redness, swelling Slow improvement vs not responding to doxycycline PCN allergic so I will not add anything - d/c doxy Start bactrim  - will do regular strength given slightly decreased renal function Her daughter will monitor closely and call if there is no improvement or any concerns.

## 2019-07-07 NOTE — Patient Instructions (Addendum)
Stop the doxycycline.  Start Bactrim and take for 7-10 days depending on the symptoms.      Please call if there is no improvement in your symptoms.

## 2019-07-09 ENCOUNTER — Other Ambulatory Visit: Payer: Self-pay | Admitting: Internal Medicine

## 2019-07-10 NOTE — Telephone Encounter (Signed)
Pt last saw Dr Caryl Comes 02/25/19, last labs 04/16/19 Creat 1.09, age 78, weight 45.4kg, CrCl 30.49, based on CrCl pt is on appropriate dosage of Pradaxa 150mg  BID at present will continue to monitor closely is CrCl decreases <30 pt will need to be changed to 75mg  BID.

## 2019-07-14 ENCOUNTER — Ambulatory Visit
Admission: RE | Admit: 2019-07-14 | Discharge: 2019-07-14 | Disposition: A | Discharge status: Home / Self Care | Payer: Medicare HMO | Source: Ambulatory Visit | Attending: Internal Medicine | Admitting: Internal Medicine

## 2019-07-14 ENCOUNTER — Ambulatory Visit (INDEPENDENT_AMBULATORY_CARE_PROVIDER_SITE_OTHER): Payer: Medicare HMO | Admitting: "Endocrinology

## 2019-07-14 ENCOUNTER — Other Ambulatory Visit: Payer: Self-pay

## 2019-07-14 ENCOUNTER — Encounter (INDEPENDENT_AMBULATORY_CARE_PROVIDER_SITE_OTHER): Payer: Self-pay | Admitting: "Endocrinology

## 2019-07-14 VITALS — BP 112/70 | HR 74 | Wt 102.0 lb

## 2019-07-14 DIAGNOSIS — Z853 Personal history of malignant neoplasm of breast: Secondary | ICD-10-CM

## 2019-07-14 DIAGNOSIS — R7303 Prediabetes: Secondary | ICD-10-CM | POA: Diagnosis not present

## 2019-07-14 DIAGNOSIS — E049 Nontoxic goiter, unspecified: Secondary | ICD-10-CM

## 2019-07-14 DIAGNOSIS — E559 Vitamin D deficiency, unspecified: Secondary | ICD-10-CM

## 2019-07-14 DIAGNOSIS — E063 Autoimmune thyroiditis: Secondary | ICD-10-CM

## 2019-07-14 DIAGNOSIS — R921 Mammographic calcification found on diagnostic imaging of breast: Secondary | ICD-10-CM | POA: Diagnosis not present

## 2019-07-14 DIAGNOSIS — Z720 Tobacco use: Secondary | ICD-10-CM

## 2019-07-14 DIAGNOSIS — R432 Parageusia: Secondary | ICD-10-CM

## 2019-07-14 DIAGNOSIS — E210 Primary hyperparathyroidism: Secondary | ICD-10-CM

## 2019-07-14 LAB — POCT GLYCOSYLATED HEMOGLOBIN (HGB A1C): Hemoglobin A1C: 5.9 % — AB (ref 4.0–5.6)

## 2019-07-14 LAB — POCT GLUCOSE (DEVICE FOR HOME USE): POC Glucose: 173 mg/dl — AB (ref 70–99)
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
## 2019-07-14 NOTE — Patient Instructions (Signed)
Follow up visit in 3 months. Please repeat lab tests about 2 weeks prior.

## 2019-07-16 ENCOUNTER — Encounter: Payer: Self-pay | Admitting: Internal Medicine

## 2019-07-21 ENCOUNTER — Ambulatory Visit: Payer: Self-pay | Admitting: Radiation Oncology

## 2019-07-22 ENCOUNTER — Telehealth: Payer: Self-pay | Admitting: Internal Medicine

## 2019-07-22 NOTE — Telephone Encounter (Signed)
Spoke with patient and offered her a sooner appointment. She stated that she felt ok and that she could wait until Monday. I told her to call if anything changed.

## 2019-07-22 NOTE — Telephone Encounter (Signed)
Copied from [place] [id number]. Topic: General - Other >> Jul 22, 2019 12:37 PM Keene Breath wrote: Reason for CRM: Patient called to inform the nurse and doctor that she had fallen yesterday.  She said she is ok and just has a bruise on left knee and skin or rt arm peeled off about an inc.  Put bandage and ointment on it.  Patient wants to know if she should come in earlier than her appt. On Monday,  Please call to discuss at [phone]

## 2019-07-24 ENCOUNTER — Ambulatory Visit
Admission: RE | Admit: 2019-07-24 | Discharge: 2019-07-24 | Disposition: A | Discharge status: Home / Self Care | Payer: Medicare HMO | Source: Ambulatory Visit | Attending: Radiation Oncology | Admitting: Radiation Oncology

## 2019-07-24 ENCOUNTER — Other Ambulatory Visit: Payer: Self-pay

## 2019-07-24 ENCOUNTER — Encounter: Payer: Self-pay | Admitting: Radiation Oncology

## 2019-07-24 VITALS — BP 118/62 | HR 65 | Temp 98.2°F | Resp 18 | Ht 66.0 in | Wt 102.2 lb

## 2019-07-24 DIAGNOSIS — D0511 Intraductal carcinoma in situ of right breast: Secondary | ICD-10-CM

## 2019-07-24 DIAGNOSIS — Z86000 Personal history of in-situ neoplasm of breast: Secondary | ICD-10-CM | POA: Insufficient documentation

## 2019-07-24 DIAGNOSIS — Z7984 Long term (current) use of oral hypoglycemic drugs: Secondary | ICD-10-CM | POA: Insufficient documentation

## 2019-07-24 DIAGNOSIS — Z79899 Other long term (current) drug therapy: Secondary | ICD-10-CM | POA: Insufficient documentation

## 2019-07-24 DIAGNOSIS — N6459 Other signs and symptoms in breast: Secondary | ICD-10-CM | POA: Diagnosis not present

## 2019-07-24 DIAGNOSIS — Z08 Encounter for follow-up examination after completed treatment for malignant neoplasm: Secondary | ICD-10-CM | POA: Diagnosis not present

## 2019-07-24 DIAGNOSIS — Z923 Personal history of irradiation: Secondary | ICD-10-CM | POA: Diagnosis not present

## 2019-07-24 NOTE — Progress Notes (Signed)
Pt presents today for f/u with Dr. Sondra Come. Pt reports she had mammogram last week and has result letter with her. Pt denies c/o pain in breast. Pt not using any special moisturizer on breast.   BP 118/62 (BP Location: Left Arm, Patient Position: Sitting)   Pulse 65   Temp 98.2 F (36.8 C) (Temporal)   Resp 18   Ht 5\' 6"  (1.676 m)   Wt 102 lb 4 oz (46.4 kg)   SpO2 99%   BMI 16.50 kg/m   Wt Readings from Last 3 Encounters:  07/24/19 102 lb 4 oz (46.4 kg)  07/14/19 102 lb (46.3 kg)  07/07/19 100 lb 1.9 oz (45.4 kg)   Loma Sousa, RN BSN

## 2019-07-24 NOTE — Patient Instructions (Signed)
Coronavirus (COVID-19) Are you at risk?  Are you at risk for the Coronavirus (COVID-19)?  To be considered HIGH RISK for Coronavirus (COVID-19), you have to meet the following criteria:  . Traveled to [place], [place], [place], [place] or [place]; or in the [place] to [place], [place], [place], or [place]; and have fever, cough, and shortness of breath within the last 2 weeks of travel OR . Been in close contact with a person diagnosed with COVID-19 within the last 2 weeks and have fever, cough, and shortness of breath . IF YOU DO NOT MEET THESE CRITERIA, YOU ARE CONSIDERED LOW RISK FOR COVID-19.  What to do if you are HIGH RISK for COVID-19?  . If you are having a medical emergency, call 911. . Seek medical care right away. Before you go to a doctor's office, urgent care or emergency department, call ahead and tell them about your recent travel, contact with someone diagnosed with COVID-19, and your symptoms. You should receive instructions from your physician's office regarding next steps of care.  . When you arrive at healthcare provider, tell the healthcare staff immediately you have returned from visiting [place], [place], [place], [place] or [place]; or traveled in the [place] to [place], [place], [place], or [place]; in the last two weeks or you have been in close contact with a person diagnosed with COVID-19 in the last 2 weeks.   . Tell the health care staff about your symptoms: fever, cough and shortness of breath. . After you have been seen by a medical provider, you will be either: o Tested for (COVID-19) and discharged home on quarantine except to seek medical care if symptoms worsen, and asked to  - Stay home and avoid contact with others until you get your results (4-5 days)  - Avoid travel on public transportation if possible (such as bus, train, or airplane) or o Sent to the Emergency Department by EMS for evaluation, COVID-19 testing, and possible  admission depending on your condition and test results.  What to do if you are LOW RISK for COVID-19?  Reduce your risk of any infection by using the same precautions used for avoiding the common cold or flu:  . Wash your hands often with soap and warm water for at least 20 seconds.  If soap and water are not readily available, use an alcohol-based hand sanitizer with at least 60% alcohol.  . If coughing or sneezing, cover your mouth and nose by coughing or sneezing into the elbow areas of your shirt or coat, into a tissue or into your sleeve (not your hands). . Avoid shaking hands with others and consider head nods or verbal greetings only. . Avoid touching your eyes, nose, or mouth with unwashed hands.  . Avoid close contact with people who are sick. . Avoid places or events with large numbers of people in one location, like concerts or sporting events. . Carefully consider travel plans you have or are making. . If you are planning any travel outside or inside the [place], visit the CDC's Travelers' Health webpage for the latest health notices. . If you have some symptoms but not all symptoms, continue to monitor at home and seek medical attention if your symptoms worsen. . If you are having a medical emergency, call 911.   ADDITIONAL HEALTHCARE OPTIONS FOR PATIENTS  Advance Telehealth / e-Visit: [email or web address]         [facility]: [phone]  [facility]: [phone]                   [facility]: [phone]   

## 2019-07-24 NOTE — Progress Notes (Signed)
Radiation Oncology         [phone] ________________________________  Name: Stacy [name] MRN: [id number]  Date: 07/24/2019  DOB: 09/08/1940  Follow-Up Visit Note  CC: Binnie Rail, MD  Armandina Gemma, MD  No diagnosis found.  Diagnosis:   78 y.o. female with High-grade, DCIS of right breast, ER/PR (neg) Pathological stage:pTis, pNX  Interval Since Last Radiation:  1 year 8 months  Radiation treatment dates:   10/24/2017 - 11/20/2017 Site/dose:   1. Right breast / 2.67 Gy x 15 fractions for a total dose of 40.05 Gy 2. Boost / 2 Gy x 5 fractions, for a total dose of 10 Gy  Narrative:  The patient returns today for routine follow-up. Since her last visit, she underwent bilateral mammogram on 07/14/2019 showing: no evidence of malignancy in either breast.               On review of systems, the patient denies breast pain and any other symptoms. She notes she is not using any special moisturizer on her breast.  She denies any nipple discharge or bleeding.  She occasionally will notice some dryness to the nipple region.  ALLERGIES:  is allergic to penicillins; latex; adhesive [tape]; codeine; metronidazole; and other.  Meds: Current Outpatient Medications  Medication Sig Dispense Refill  . acetaminophen (TYLENOL) 500 MG tablet Take 500 mg by mouth every 6 (six) hours as needed (for pain.).    Marland Kitchen Alcohol Swabs (B-D SINGLE USE SWABS REGULAR) PADS USE TWICE DAILY 200 each 2  . atorvastatin (LIPITOR) 80 MG tablet TAKE 1 TABLET EVERY DAY 90 tablet 0  . Biotin 10000 MCG TABS Take 10,000 mcg by mouth daily.    . busPIRone (BUSPAR) 15 MG tablet Take 15 mg by mouth daily.     Marland Kitchen CALCIUM CITRATE-VITAMIN D PO Take 1 tablet by mouth daily.     . diclofenac Sodium (VOLTAREN) 1 % GEL Apply 4 g topically 4 (four) times daily. 100 g 5  . donepezil (ARICEPT) 10 MG tablet TAKE 1 TABLET AT BEDTIME 90 tablet 1  . ENSURE (ENSURE) Take 237 mLs by mouth 2 (two) times daily between meals.  42660 mL 3  . FLUoxetine (PROZAC) 20 MG capsule     . FLUoxetine (PROZAC) 20 MG tablet Take 20 mg by mouth daily.    Marland Kitchen glucose blood (ACCU-CHEK AVIVA PLUS) test strip TEST BLOOD SUGAR TWICE DAILY. E11.9 200 strip 1  . hydrocortisone (ANUSOL-HC) 2.5 % rectal cream PLACE 1 APPLICATION RECTALLY 2 (TWO) TIMES DAILY. 30 g 0  . ketoconazole (NIZORAL) 2 % cream Apply 1 application topically daily. 60 g 5  . ketotifen (ZADITOR) 0.025 % ophthalmic solution Place 1 drop into both eyes as needed.    . lithium carbonate (LITHOBID) 300 MG CR tablet Take 300 mg by mouth daily.    Marland Kitchen loratadine (CLARITIN) 10 MG tablet Take 10 mg by mouth daily.    . Melatonin 5 MG TABS Take 1 tablet by mouth daily.     . metFORMIN (GLUCOPHAGE-XR) 500 MG 24 hr tablet TAKE 1 TABLET EVERY DAY WITH BREAKFAST 90 tablet 0  . montelukast (SINGULAIR) 10 MG tablet Take 1 tablet (10 mg total) by mouth at bedtime. 30 tablet 3  . Multiple Vitamin (MULTIVITAMIN WITH MINERALS) TABS tablet Take 1 tablet by mouth daily.     . mupirocin ointment (BACTROBAN) 2 % Apply 1 application topically 2 (two) times daily. 22 g 0  . non-metallic deodorant (ALRA) MISC Apply  1 application topically daily as needed.    Marland Kitchen omeprazole (PRILOSEC) 40 MG capsule TAKE 1 CAPSULE FOUR TIMES WEEKLY ON SUNDAY, MONDAY, WEDNESDAY, AND FRIDAY 52 capsule 1  . oxybutynin (DITROPAN) 5 MG tablet TAKE 1 TABLET TWICE DAILY 180 tablet 1  . Polyethyl Glycol-Propyl Glycol (SYSTANE ULTRA) 0.4-0.3 % SOLN Place 1-2 drops into both eyes 3 (three) times daily as needed (for dry/irritated eyes.).    Marland Kitchen PRADAXA 150 MG CAPS capsule TAKE 1 CAPSULE TWICE DAILY 180 capsule 1  . PRODIGY TWIST TOP LANCETS 28G MISC CHECK BLOOD SUGAR EVERY DAY 100 each 1  . pyridoxine (B-6) 200 MG tablet Take 200 mg by mouth 2 (two) times daily.     Marland Kitchen sulfamethoxazole-trimethoprim (BACTRIM) 400-80 MG tablet Take 1 tablet by mouth 2 (two) times daily. (Patient not taking: Reported on 07/14/2019) 20 tablet 0  .  SYNTHROID 112 MCG tablet TAKE 1 TABLET 5 DAYS A WEEK AND 1 AND 1/2 TABLETS 2 DAYS A WEEK 104 tablet 1   No current facility-administered medications for this encounter.    Physical Findings: The patient is in no acute distress. Patient is alert and oriented.  vitals were not taken for this visit.   Lungs are clear to auscultation bilaterally. Heart has regular rate and rhythm. No palpable cervical, supraclavicular, or axillary adenopathy. Abdomen soft, non-tender, normal bowel sounds.  Breast Exam Left Breast: No palpable mass, nipple discharge or bleeding. Patient has a pacemaker in the left upper chest.  Right Breast: No palpable mass, nipple discharge or bleeding. Minimal radiation changes.   Lab Findings: Lab Results  Component Value Date   WBC 6.6 01/16/2019   HGB 14.6 01/16/2019   HCT 43.1 01/16/2019   MCV 96.0 01/16/2019   PLT 186 01/16/2019    Radiographic Findings: MM DIAG BREAST TOMO BILATERAL  Result Date: 07/14/2019 CLINICAL DATA:  History of right breast cancer status post lumpectomy in 2019. Follow-up of probable benign calcifications in the right breast. EXAM: DIGITAL DIAGNOSTIC BILATERAL MAMMOGRAM WITH CAD AND TOMO COMPARISON:  Previous exam(s). ACR Breast Density Category c: The breast tissue is heterogeneously dense, which may obscure small masses. FINDINGS: Stable lumpectomy changes are seen in the right breast. Scattered punctate calcifications anterior to the lumpectomy site in the right breast are stable. No suspicious mass or malignant type microcalcifications identified in either breast. Mammographic images were processed with CAD. IMPRESSION: Stable probable benign calcifications in the right breast. No evidence of malignancy in either breast. RECOMMENDATION: Bilateral diagnostic mammogram in 1 year is recommended. This will document stability of the calcifications in the right breast for 2+ years. I have discussed the findings and recommendations with the patient.  If applicable, a reminder letter will be sent to the patient regarding the next appointment. BI-RADS CATEGORY  3: Probably benign. Electronically Signed   By: Lillia Mountain M.D.   On: 07/14/2019 10:28    Impression:  High-grade, DCIS of right breast, ER/PR (neg); Pathological stage:pTis, pNX.   No evidence of recurrence on physical exam or recent mammography. Radiology has recommended mammography on this patient every 6 months.   Plan:  Routine follow-up in 6 months after the patient's next mammogram.  She is not following up with medical oncology or surgery.  ____________________________________  Blair Promise, PhD, MD  This document serves as a record of services personally performed by Gery Pray, MD. It was created on his behalf by Wilburn Mylar, a trained medical scribe. The creation of this record is based on  the scribe's personal observations and the provider's statements to them. This document has been checked and approved by the attending provider.

## 2019-07-27 NOTE — Patient Instructions (Addendum)
  Tests ordered today. Your results will be released to MyChart (or called to you) after review.  If any changes need to be made, you will be notified at that same time.    Medications reviewed and updated.  Changes include :   none     Please followup in 6 months   
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
## 2019-07-28 ENCOUNTER — Ambulatory Visit: Payer: Medicare HMO

## 2019-07-28 ENCOUNTER — Other Ambulatory Visit: Payer: Self-pay

## 2019-07-28 ENCOUNTER — Ambulatory Visit (INDEPENDENT_AMBULATORY_CARE_PROVIDER_SITE_OTHER): Payer: Medicare HMO | Admitting: Internal Medicine

## 2019-07-28 ENCOUNTER — Encounter: Payer: Self-pay | Admitting: Internal Medicine

## 2019-07-28 VITALS — BP 148/78 | HR 60 | Temp 98.3°F | Resp 16 | Ht 66.0 in | Wt 102.4 lb

## 2019-07-28 DIAGNOSIS — F03A0 Unspecified dementia, mild, without behavioral disturbance, psychotic disturbance, mood disturbance, and anxiety: Secondary | ICD-10-CM

## 2019-07-28 DIAGNOSIS — I10 Essential (primary) hypertension: Secondary | ICD-10-CM | POA: Diagnosis not present

## 2019-07-28 DIAGNOSIS — E782 Mixed hyperlipidemia: Secondary | ICD-10-CM

## 2019-07-28 DIAGNOSIS — Z9861 Coronary angioplasty status: Secondary | ICD-10-CM

## 2019-07-28 DIAGNOSIS — I2510 Atherosclerotic heart disease of native coronary artery without angina pectoris: Secondary | ICD-10-CM | POA: Diagnosis not present

## 2019-07-28 DIAGNOSIS — E119 Type 2 diabetes mellitus without complications: Secondary | ICD-10-CM | POA: Diagnosis not present

## 2019-07-28 DIAGNOSIS — K219 Gastro-esophageal reflux disease without esophagitis: Secondary | ICD-10-CM | POA: Diagnosis not present

## 2019-07-28 DIAGNOSIS — N3281 Overactive bladder: Secondary | ICD-10-CM

## 2019-07-28 DIAGNOSIS — E063 Autoimmune thyroiditis: Secondary | ICD-10-CM

## 2019-07-28 DIAGNOSIS — F0390 Unspecified dementia without behavioral disturbance: Secondary | ICD-10-CM | POA: Diagnosis not present

## 2019-07-28 LAB — LIPID PANEL
Cholesterol: 114 mg/dL (ref 0–200)
HDL: 66.5 mg/dL (ref >39.00)
LDL Cholesterol: 28 mg/dL (ref 0–99)
NonHDL: 47.69
Total CHOL/HDL Ratio: 2
Triglycerides: 98 mg/dL (ref 0.0–149.0)
VLDL: 19.6 mg/dL (ref 0.0–40.0)

## 2019-07-28 LAB — CBC WITH DIFFERENTIAL/PLATELET
Basophils Absolute: 0.1 10*3/uL (ref 0.0–0.1)
Basophils Relative: 1.1 % (ref 0.0–3.0)
Eosinophils Absolute: 0.1 10*3/uL (ref 0.0–0.7)
Eosinophils Relative: 1.3 % (ref 0.0–5.0)
HCT: 42.9 % (ref 36.0–46.0)
Hemoglobin: 14.2 g/dL (ref 12.0–15.0)
Lymphocytes Relative: 20.9 % (ref 12.0–46.0)
Lymphs Abs: 1.4 10*3/uL (ref 0.7–4.0)
MCHC: 33.1 g/dL (ref 30.0–36.0)
MCV: 96.3 fl (ref 78.0–100.0)
Monocytes Absolute: 0.6 10*3/uL (ref 0.1–1.0)
Monocytes Relative: 8.5 % (ref 3.0–12.0)
Neutro Abs: 4.4 10*3/uL (ref 1.4–7.7)
Neutrophils Relative %: 68.2 % (ref 43.0–77.0)
Platelets: 185 10*3/uL (ref 150.0–400.0)
RBC: 4.46 Mil/uL (ref 3.87–5.11)
RDW: 12.7 % (ref 11.5–15.5)
WBC: 6.5 10*3/uL (ref 4.0–10.5)

## 2019-07-28 LAB — COMPREHENSIVE METABOLIC PANEL
ALT: 28 U/L (ref 0–35)
AST: 28 U/L (ref 0–37)
Albumin: 4.3 g/dL (ref 3.5–5.2)
Alkaline Phosphatase: 55 U/L (ref 39–117)
BUN: 29 mg/dL — ABNORMAL HIGH (ref 6–23)
CO2: 31 mEq/L (ref 19–32)
Calcium: 10.7 mg/dL — ABNORMAL HIGH (ref 8.4–10.5)
Chloride: 103 mEq/L (ref 96–112)
Creatinine, Ser: 0.95 mg/dL (ref 0.40–1.20)
GFR: 56.79 mL/min — ABNORMAL LOW (ref >60.00)
Glucose, Bld: 104 mg/dL — ABNORMAL HIGH (ref 70–99)
Potassium: 4.2 mEq/L (ref 3.5–5.1)
Sodium: 139 mEq/L (ref 135–145)
Total Bilirubin: 0.4 mg/dL (ref 0.2–1.2)
Total Protein: 6.5 g/dL (ref 6.0–8.3)

## 2019-07-28 NOTE — Assessment & Plan Note (Signed)
Management per endocrine 

## 2019-07-28 NOTE — Assessment & Plan Note (Signed)
Fairly controlled Continue oxybutynin

## 2019-07-28 NOTE — Assessment & Plan Note (Signed)
Following with endocrine Most recent A1c 5.9%-well-controlled  management per endocrine

## 2019-07-28 NOTE — Assessment & Plan Note (Signed)
BP Readings from Last 3 Encounters:  07/28/19 (!) 148/78  07/24/19 118/62  07/14/19 112/70   Blood pressure well controlled Continue current medications CMP

## 2019-07-28 NOTE — Assessment & Plan Note (Signed)
Per daughter her memory has gotten slightly worse Continue Aricept

## 2019-07-28 NOTE — Assessment & Plan Note (Signed)
GERD controlled Continue daily medication  

## 2019-07-28 NOTE — Assessment & Plan Note (Signed)
Check lipid panel.  Continue atorvastatin. 

## 2019-07-28 NOTE — Assessment & Plan Note (Addendum)
No concerning chest pain, shortness of breath or palpitations Following with cardiology Continue current medications CMP, lipids, CBC

## 2019-08-02 ENCOUNTER — Encounter: Payer: Self-pay | Admitting: Internal Medicine

## 2019-08-11 ENCOUNTER — Other Ambulatory Visit: Payer: Self-pay | Admitting: Internal Medicine

## 2019-08-12 ENCOUNTER — Telehealth: Payer: Self-pay | Admitting: Internal Medicine

## 2019-08-12 NOTE — Telephone Encounter (Signed)
Patient calling stating her pacemaker was not working yesterday and she could not send in a transmission. She says she called Medtronic and they are sending her a new one. She says she will send in a transmission as soon as she gets the new device set up.

## 2019-08-12 NOTE — Telephone Encounter (Signed)
I responded to pt my chart message.

## 2019-08-13 ENCOUNTER — Other Ambulatory Visit: Payer: Self-pay | Admitting: Internal Medicine

## 2019-08-13 DIAGNOSIS — Z9889 Other specified postprocedural states: Secondary | ICD-10-CM

## 2019-08-15 ENCOUNTER — Ambulatory Visit (INDEPENDENT_AMBULATORY_CARE_PROVIDER_SITE_OTHER): Payer: Medicare HMO | Admitting: *Deleted

## 2019-08-15 ENCOUNTER — Encounter: Payer: Self-pay | Admitting: Internal Medicine

## 2019-08-15 DIAGNOSIS — I480 Paroxysmal atrial fibrillation: Secondary | ICD-10-CM

## 2019-08-15 LAB — CUP PACEART REMOTE DEVICE CHECK
Battery Impedance: 230 Ohm
Battery Remaining Longevity: 135 mo
Battery Voltage: 2.8 V
Brady Statistic AP VP Percent: 6 %
Brady Statistic AP VS Percent: 44 %
Brady Statistic AS VP Percent: 0 %
Brady Statistic AS VS Percent: 49 %
Date Time Interrogation Session: 20210108125535
Implantable Lead Implant Date: 20010521
Implantable Lead Implant Date: 20010521
Implantable Lead Location: 753859
Implantable Lead Location: 753860
Implantable Lead Model: 4592
Implantable Lead Model: 6940
Implantable Pulse Generator Implant Date: 20161229
Lead Channel Impedance Value: 1127 Ohm
Lead Channel Impedance Value: 619 Ohm
Lead Channel Pacing Threshold Amplitude: 0.625 V
Lead Channel Pacing Threshold Amplitude: 1.25 V
Lead Channel Pacing Threshold Pulse Width: 0.4 ms
Lead Channel Pacing Threshold Pulse Width: 0.4 ms
Lead Channel Setting Pacing Amplitude: 2 V
Lead Channel Setting Pacing Amplitude: 2.75 V
Lead Channel Setting Pacing Pulse Width: 0.4 ms
Lead Channel Setting Sensing Sensitivity: 4 mV

## 2019-08-19 ENCOUNTER — Other Ambulatory Visit: Payer: Self-pay | Admitting: Internal Medicine

## 2019-09-03 DIAGNOSIS — F3174 Bipolar disorder, in full remission, most recent episode manic: Secondary | ICD-10-CM | POA: Diagnosis not present

## 2019-10-02 DIAGNOSIS — F3174 Bipolar disorder, in full remission, most recent episode manic: Secondary | ICD-10-CM | POA: Diagnosis not present

## 2019-10-08 DIAGNOSIS — H5213 Myopia, bilateral: Secondary | ICD-10-CM | POA: Diagnosis not present

## 2019-10-08 DIAGNOSIS — E559 Vitamin D deficiency, unspecified: Secondary | ICD-10-CM | POA: Diagnosis not present

## 2019-10-08 DIAGNOSIS — H16223 Keratoconjunctivitis sicca, not specified as Sjogren's, bilateral: Secondary | ICD-10-CM | POA: Diagnosis not present

## 2019-10-08 DIAGNOSIS — Z961 Presence of intraocular lens: Secondary | ICD-10-CM | POA: Diagnosis not present

## 2019-10-08 DIAGNOSIS — H02235 Paralytic lagophthalmos left lower eyelid: Secondary | ICD-10-CM | POA: Diagnosis not present

## 2019-10-08 DIAGNOSIS — H524 Presbyopia: Secondary | ICD-10-CM | POA: Diagnosis not present

## 2019-10-08 DIAGNOSIS — E119 Type 2 diabetes mellitus without complications: Secondary | ICD-10-CM | POA: Diagnosis not present

## 2019-10-08 DIAGNOSIS — E210 Primary hyperparathyroidism: Secondary | ICD-10-CM | POA: Diagnosis not present

## 2019-10-08 DIAGNOSIS — H52203 Unspecified astigmatism, bilateral: Secondary | ICD-10-CM | POA: Diagnosis not present

## 2019-10-08 DIAGNOSIS — E063 Autoimmune thyroiditis: Secondary | ICD-10-CM | POA: Diagnosis not present

## 2019-10-11 LAB — VITAMIN D 1,25 DIHYDROXY
Vitamin D 1, 25 (OH)2 Total: 25 pg/mL (ref 18–72)
Vitamin D2 1, 25 (OH)2: <8 pg/mL
Vitamin D3 1, 25 (OH)2: 25 pg/mL

## 2019-10-11 LAB — TSH: TSH: 3.69 mIU/L (ref 0.40–4.50)

## 2019-10-11 LAB — T3, FREE: T3, Free: 2.5 pg/mL (ref 2.3–4.2)

## 2019-10-11 LAB — T4, FREE: Free T4: 1.2 ng/dL (ref 0.8–1.8)

## 2019-10-11 LAB — VITAMIN D 25 HYDROXY (VIT D DEFICIENCY, FRACTURES): Vit D, 25-Hydroxy: 45 ng/mL (ref 30–100)

## 2019-10-11 LAB — PTH, INTACT AND CALCIUM
Calcium: 10.7 mg/dL — ABNORMAL HIGH (ref 8.6–10.4)
PTH: 21 pg/mL (ref 14–64)

## 2019-10-13 ENCOUNTER — Other Ambulatory Visit: Payer: Self-pay

## 2019-10-13 ENCOUNTER — Ambulatory Visit (INDEPENDENT_AMBULATORY_CARE_PROVIDER_SITE_OTHER): Payer: Medicare HMO | Admitting: "Endocrinology

## 2019-10-13 ENCOUNTER — Encounter (INDEPENDENT_AMBULATORY_CARE_PROVIDER_SITE_OTHER): Payer: Self-pay | Admitting: "Endocrinology

## 2019-10-13 VITALS — BP 116/78 | HR 88 | Wt 104.2 lb

## 2019-10-13 DIAGNOSIS — R634 Abnormal weight loss: Secondary | ICD-10-CM

## 2019-10-13 DIAGNOSIS — R432 Parageusia: Secondary | ICD-10-CM | POA: Diagnosis not present

## 2019-10-13 DIAGNOSIS — F17200 Nicotine dependence, unspecified, uncomplicated: Secondary | ICD-10-CM

## 2019-10-13 DIAGNOSIS — E211 Secondary hyperparathyroidism, not elsewhere classified: Secondary | ICD-10-CM | POA: Diagnosis not present

## 2019-10-13 DIAGNOSIS — R251 Tremor, unspecified: Secondary | ICD-10-CM

## 2019-10-13 DIAGNOSIS — E559 Vitamin D deficiency, unspecified: Secondary | ICD-10-CM

## 2019-10-13 DIAGNOSIS — E049 Nontoxic goiter, unspecified: Secondary | ICD-10-CM | POA: Diagnosis not present

## 2019-10-13 DIAGNOSIS — E063 Autoimmune thyroiditis: Secondary | ICD-10-CM | POA: Diagnosis not present

## 2019-10-13 DIAGNOSIS — R7303 Prediabetes: Secondary | ICD-10-CM | POA: Diagnosis not present

## 2019-10-13 LAB — POCT GLUCOSE (DEVICE FOR HOME USE): POC Glucose: 102.8 mg/dl — AB (ref 70–99)

## 2019-10-13 LAB — POCT GLYCOSYLATED HEMOGLOBIN (HGB A1C): Hemoglobin A1C: 5.7 % — AB (ref 4.0–5.6)
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
## 2019-10-13 NOTE — Patient Instructions (Signed)
Follow up visit in 3 months. 

## 2019-10-21 DIAGNOSIS — M1712 Unilateral primary osteoarthritis, left knee: Secondary | ICD-10-CM | POA: Diagnosis not present

## 2019-10-22 ENCOUNTER — Telehealth: Payer: Self-pay

## 2019-10-22 ENCOUNTER — Other Ambulatory Visit: Payer: Self-pay | Admitting: Internal Medicine

## 2019-10-22 DIAGNOSIS — F3131 Bipolar disorder, current episode depressed, mild: Secondary | ICD-10-CM

## 2019-10-22 NOTE — Telephone Encounter (Signed)
New message    Mcarthur Rossetti is on the phone with the patient  asking for an approval referral from Dr. Quay Burow for psychiatrist therapy.

## 2019-10-22 NOTE — Telephone Encounter (Signed)
Referral ordered

## 2019-10-22 NOTE — Telephone Encounter (Signed)
Okay-due today need a verbal and actual order?  If they need an actual order does she have someone that she wants to go to or?

## 2019-10-22 NOTE — Telephone Encounter (Signed)
Would like you to put in a referral to [facility] to see Anu. Does not know last name because it is long.

## 2019-10-31 ENCOUNTER — Other Ambulatory Visit: Payer: Self-pay | Admitting: Internal Medicine

## 2019-10-31 ENCOUNTER — Other Ambulatory Visit (INDEPENDENT_AMBULATORY_CARE_PROVIDER_SITE_OTHER): Payer: Self-pay | Admitting: "Endocrinology

## 2019-10-31 DIAGNOSIS — E049 Nontoxic goiter, unspecified: Secondary | ICD-10-CM

## 2019-10-31 DIAGNOSIS — E210 Primary hyperparathyroidism: Secondary | ICD-10-CM

## 2019-10-31 DIAGNOSIS — E063 Autoimmune thyroiditis: Secondary | ICD-10-CM

## 2019-10-31 DIAGNOSIS — E211 Secondary hyperparathyroidism, not elsewhere classified: Secondary | ICD-10-CM

## 2019-11-04 ENCOUNTER — Encounter: Payer: Self-pay | Admitting: Internal Medicine

## 2019-11-04 ENCOUNTER — Telehealth: Payer: Self-pay

## 2019-11-04 NOTE — Telephone Encounter (Signed)
Referral faxed

## 2019-11-04 NOTE — Telephone Encounter (Signed)
New message   Daughter [name] is on the phone   Mcarthur Rossetti is the one that needs the referral to pay for the therapy session   Phone # [phone]  Fax # [phone]   The therapy person name is Kerman Passey  Mom has another therapy session appt on 4.8.21 as a virtual appt.

## 2019-11-10 ENCOUNTER — Telehealth: Payer: Self-pay | Admitting: Internal Medicine

## 2019-11-10 ENCOUNTER — Telehealth: Payer: Self-pay

## 2019-11-10 DIAGNOSIS — I480 Paroxysmal atrial fibrillation: Secondary | ICD-10-CM

## 2019-11-10 NOTE — Telephone Encounter (Signed)
F/u   The daughter called [employer] back they were stating issues with incoming fax.    Verbal call  [phone] can speak with anyone.

## 2019-11-10 NOTE — Telephone Encounter (Signed)
New message   The daughter is aware of previous message and nurse refaxed referral back on the same day.   The daughter is saying Stacy [name] has not received the referral, asking to re fax the referral back.   Phone # [phone]  Fax # [phone]   The daughter is asking for a copy of the referral and will come by the office to pick it up

## 2019-11-10 NOTE — Chronic Care Management (AMB) (Signed)
°  Chronic Care Management   Note  11/10/2019 Name: Stacy [name] MRN: [id number] DOB: May 09, 1941  Stacy [name] is a 79 y.o. year old female who is a primary care patient of Burns, Claudina Lick, MD. I reached out to [name] by phone today in response to a referral sent by Stacy [name]'s PCP, Binnie Rail, MD.   Stacy [name] was given information about Chronic Care Management services today including:  1. CCM service includes personalized support from designated clinical staff supervised by her physician, including individualized plan of care and coordination with other care providers 2. 24/7 contact phone numbers for assistance for urgent and routine care needs. 3. Service will only be billed when office clinical staff spend 20 minutes or more in a month to coordinate care. 4. Only one practitioner may furnish and bill the service in a calendar month. 5. The patient may stop CCM services at any time (effective at the end of the month) by phone call to the office staff.   Patient agreed to services and verbal consent obtained.   Follow up plan:   Raynicia Dukes [employer] Scheduler

## 2019-11-12 NOTE — Telephone Encounter (Signed)
Was unable to get in touch with anyone by the number listed below. Have refaxed the referral 3 times.

## 2019-11-14 ENCOUNTER — Ambulatory Visit (INDEPENDENT_AMBULATORY_CARE_PROVIDER_SITE_OTHER): Payer: Medicare HMO | Admitting: *Deleted

## 2019-11-14 DIAGNOSIS — I480 Paroxysmal atrial fibrillation: Secondary | ICD-10-CM

## 2019-11-14 LAB — CUP PACEART REMOTE DEVICE CHECK
Battery Impedance: 230 Ohm
Battery Remaining Longevity: 134 mo
Battery Voltage: 2.79 V
Brady Statistic AP VP Percent: 6 %
Brady Statistic AP VS Percent: 47 %
Brady Statistic AS VP Percent: 0 %
Brady Statistic AS VS Percent: 47 %
Date Time Interrogation Session: 20210409082550
Implantable Lead Implant Date: 20010521
Implantable Lead Implant Date: 20010521
Implantable Lead Location: 753859
Implantable Lead Location: 753860
Implantable Lead Model: 4592
Implantable Lead Model: 6940
Implantable Pulse Generator Implant Date: 20161229
Lead Channel Impedance Value: 1170 Ohm
Lead Channel Impedance Value: 580 Ohm
Lead Channel Pacing Threshold Amplitude: 0.625 V
Lead Channel Pacing Threshold Amplitude: 1.5 V
Lead Channel Pacing Threshold Pulse Width: 0.4 ms
Lead Channel Pacing Threshold Pulse Width: 0.4 ms
Lead Channel Setting Pacing Amplitude: 2 V
Lead Channel Setting Pacing Amplitude: 3 V
Lead Channel Setting Pacing Pulse Width: 0.4 ms
Lead Channel Setting Sensing Sensitivity: 4 mV

## 2019-11-14 NOTE — Progress Notes (Signed)
PPM Remote  

## 2019-11-18 ENCOUNTER — Telehealth: Payer: Self-pay | Admitting: Internal Medicine

## 2019-11-18 NOTE — Telephone Encounter (Signed)
Per chart referral was sent by Cecille Rubin. Forwarding to [place] to f/u.Marland KitchenJohny Chess

## 2019-11-18 NOTE — Telephone Encounter (Signed)
New message:   Pt's daughter is calling and states the referral was sent to the wrong department and [employer] states we need to call again to [phone] and choose the [place] option for the patient to see a Mental  Therapist. Please advise.

## 2019-11-20 NOTE — Telephone Encounter (Signed)
Spoke to billing at [facility] and she states everything looks good on their part and pt's insurance is paying her appointments.  Pt is aware

## 2019-11-24 ENCOUNTER — Encounter: Payer: Self-pay | Admitting: Internal Medicine

## 2019-11-25 ENCOUNTER — Telehealth: Payer: Self-pay | Admitting: Internal Medicine

## 2019-11-25 NOTE — Telephone Encounter (Signed)
   Went to chart to because pt is following up mychart question sent yesterday 11/24/19. Advised Nurse responded. Pt understood

## 2019-11-27 DIAGNOSIS — M1712 Unilateral primary osteoarthritis, left knee: Secondary | ICD-10-CM | POA: Diagnosis not present

## 2019-11-28 ENCOUNTER — Telehealth: Payer: Self-pay

## 2019-11-28 NOTE — Telephone Encounter (Signed)
Spoke to [facility] and pt does not need a referral for her therapy appt as long as it is billed under the practice and not provider. Ref# [id number] Spoke to [facility] and they understand this Spoke to pt's dtr and she is aware

## 2019-11-28 NOTE — Telephone Encounter (Signed)
New message   [employer] on the phone with the patient calling regarding therapy referral that's not in their system   [facility] ID [id number]  Please advise

## 2019-12-01 ENCOUNTER — Other Ambulatory Visit: Payer: Self-pay | Admitting: Internal Medicine

## 2019-12-02 NOTE — Chronic Care Management (AMB) (Signed)
Chronic Care Management Pharmacy  Name: Stacy [name]  MRN: [id number] DOB: 06-30-1941  Chief Complaint/ HPI  Stacy [name],  79 y.o. , female presents for their Initial CCM visit with the clinical pharmacist via telephone due to COVID-19 Pandemic.  PCP : Stacy Rail, MD  Their chronic conditions include: HTN, CAD, Afib, DM, GERD, HLD, hypothyroidism, OAB, Bipolar, tobacco use (3 ppd), allergies  Lives with daughter Stacy [name], looking into aquatics.   Office Visits: 07/28/19 Dr Stacy [name] OV: labs stable, no med changes.  07/07/19 Dr Stacy [name] OV: R arm cellultiis 2/2 cat scratch, minimal improvement on doxy from UC. rx'd Bactrim and Voltaren gel.  Consult Visit: 10/13/19 Dr Stacy [name] (endocrine): hypothyroidism/Hashimotos, hx parathyroidectomy. No med changes.  07/04/20 urgent care: cat scratch, rx'd doxycycline  02/25/19 Dr Stacy [name] (cardiology): device interrogated, 1 episode   Medications: Outpatient Encounter Medications as of 12/03/2019  Medication Sig Note  . acetaminophen (TYLENOL) 500 MG tablet Take 500 mg by mouth every 6 (six) hours as needed (for pain.).   Marland Kitchen Alcohol Swabs (B-D SINGLE USE SWABS REGULAR) PADS USE TWICE DAILY   . atorvastatin (LIPITOR) 80 MG tablet TAKE 1 TABLET EVERY DAY   . Biotin 10000 MCG TABS Take 10,000 mcg by mouth daily.   . busPIRone (BUSPAR) 15 MG tablet Take 15 mg by mouth daily.    Marland Kitchen CALCIUM CITRATE-VITAMIN D PO Take 1 tablet by mouth daily.    . diclofenac Sodium (VOLTAREN) 1 % GEL Apply 4 g topically 4 (four) times daily.   Marland Kitchen donepezil (ARICEPT) 10 MG tablet TAKE 1 TABLET AT BEDTIME   . ENSURE (ENSURE) Take 237 mLs by mouth 2 (two) times daily between meals. 09/24/2018: PRN  . FLUoxetine (PROZAC) 20 MG tablet Take 20 mg by mouth daily.   Marland Kitchen glucose blood (ACCU-CHEK AVIVA PLUS) test strip TEST BLOOD SUGAR TWICE DAILY   . hydrocortisone (ANUSOL-HC) 2.5 % rectal cream PLACE 1 APPLICATION RECTALLY 2 (TWO) TIMES DAILY.   Marland Kitchen ketoconazole  (NIZORAL) 2 % cream Apply 1 application topically daily.   Marland Kitchen ketotifen (ZADITOR) 0.025 % ophthalmic solution Place 1 drop into both eyes as needed.   . lithium carbonate (LITHOBID) 300 MG CR tablet Take 300 mg by mouth daily. 04/16/2019: Now takes the XR version  . loratadine (CLARITIN) 10 MG tablet Take 10 mg by mouth daily.   . Melatonin 5 MG TABS Take 1 tablet by mouth daily.  04/16/2019: Takes 1/2 tablet  . metFORMIN (GLUCOPHAGE-XR) 500 MG 24 hr tablet Take 1 tablet (500 mg total) by mouth daily with breakfast.   . montelukast (SINGULAIR) 10 MG tablet Take 1 tablet (10 mg total) by mouth at bedtime. 09/24/2018: PRN  . Multiple Vitamin (MULTIVITAMIN WITH MINERALS) TABS tablet Take 1 tablet by mouth daily.    . non-metallic deodorant Jethro Poling) MISC Apply 1 application topically daily as needed.   Marland Kitchen omeprazole (PRILOSEC) 40 MG capsule TAKE 1 CAPSULE FOUR TIMES WEEKLY ON SUNDAY, MONDAY, WEDNESDAY, AND FRIDAY (Patient taking differently: Take 40 mg by mouth. 1 capsule MWF)   . oxybutynin (DITROPAN) 5 MG tablet TAKE 1 TABLET TWICE DAILY (Patient taking differently: Take 5 mg by mouth at bedtime. )   . Polyethyl Glycol-Propyl Glycol (SYSTANE ULTRA) 0.4-0.3 % SOLN Place 1-2 drops into both eyes 3 (three) times daily as needed (for dry/irritated eyes.).   Marland Kitchen PRADAXA 150 MG CAPS capsule TAKE 1 CAPSULE TWICE DAILY   . PRODIGY TWIST TOP LANCETS 28G MISC CHECK BLOOD SUGAR EVERY DAY   .  pyridoxine (B-6) 500 MG tablet 100 mg AM and 500 mg HS   . SYNTHROID 112 MCG tablet TAKE 1 TABLET 5 DAYS A WEEK AND 1 AND 1/2 TABLETS 2 DAYS A WEEK (Patient taking differently: Take 112 mcg by mouth. 1 tablet daily except 1.5 tablets once a week)   . Thiamine HCl (VITAMIN B-1) 250 MG tablet Take 250 mg by mouth at bedtime.   . vitamin B-12 (CYANOCOBALAMIN) 1000 MCG tablet Take 1,000 mcg by mouth daily.   . mupirocin ointment (BACTROBAN) 2 % Apply 1 application topically 2 (two) times daily. (Patient not taking: Reported on 12/03/2019)     No facility-administered encounter medications on file as of 12/03/2019.     Current Diagnosis/Assessment:  SDOH Interventions     Most Recent Value  SDOH Interventions  SDOH Interventions for the Following Domains  Financial Strain  Financial Strain Interventions  Other (Comment) [all medications covered at $0]     Goals Addressed            This Visit's Progress   . Atrial fibrillation: improve safety       CARE PLAN ENTRY  Current Barriers:  . Chronic Disease Management support, education, and care coordination needs related to Atrial Fibrillation . Current regimen: dabigatran 150 mg BID  Pharmacist Clinical Goal(s):  Marland Kitchen Over the next 90 days, patient will work with PharmD and providers towards optimized anticoagulation therapy  Interventions: . Comprehensive medication review performed. . Discussed signs and symptoms of bleeding . Discussed avoiding NSAIDs due to bleeding risk  Patient Self Care Activities:  . Self administers medications as prescribed, Calls pharmacy for medication refills, and Calls provider office for new concerns or questions . Patient will continue to monitor for signs and symptoms of bleeding . Patient will contact pharamacist if copay for Pradaxa suddenly increases due to donut hole  Initial goal documentation    . Diabetes: goal A1c < 7%       CARE PLAN ENTRY (see longitudinal plan of care for additional care plan information)  Current Barriers:  . Diabetes: controlled; complicated by chronic medical conditions including Afib, HLD, CAD, tobacco use Lab Results  Component Value Date   HGBA1C 5.7 (A) 10/13/2019  Kidney Function Lab Results  Component Value Date   CREATININE 0.95 07/28/2019   CREATININE 1.09 (H) 04/16/2019   CREATININE 1.05 (H) 08/16/2018      Component Value Date/Time   GFR 56.79 (L) 07/28/2019 0908   GFRNONAA 51 (L) 08/16/2018 0938   GFRNONAA 53 (L) 10/03/2017 1526    . Current antihyperglycemic regimen:   o Metformin ER 500 mg qAM . denies hypoglycemic symptoms, including dizziness, lightheadedness, shaking, sweating . denies hyperglycemic symptoms, including polyuria, polydipsia, polyphagia, nocturia, blurred vision, neuropathy . Current meal patterns: eats 3 meals per day at daughter's urging and drinks Ensure at night to keep weight up (104 lbs currently) . Current exercise: looking [place]  . Current blood glucose readings: 100  Pharmacist Clinical Goal(s):  Marland Kitchen Over the next 90 days, patient will work with PharmD and primary care provider to address maintain A1c < 7%  Interventions: . Comprehensive medication review performed, medication list updated in electronic medical record . Inter-disciplinary care team collaboration (see longitudinal plan of care) . Discussed A1c goal and questionable necessity of metformin o Stopping metformin may lead to higher A1c o Patient follows closely with endocrinologist Dr Stacy [name] and plans to discuss at next visit  Patient Self Care Activities:  .  Patient will check blood glucose three times weekly , document, and provide at future appointments . Patient will discuss possibly stopping metformin with endocrinologist . Patient will focus on medication adherence by pill box . Patient will take medications as prescribed . Patient will contact provider with any episodes of hypoglycemia . Patient will report any questions or concerns to provider   Initial goal documentation     . GERD: monitor symptoms       CARE PLAN ENTRY  Current Barriers:  . Chronic Disease Management support, education, and care coordination needs related to GERD . Current regimen: omeprazole 40 mg on Mon, Wed, Fri  Pharmacist Clinical Goal(s):  Marland Kitchen Over the next 90 days, patient will work with PharmD and providers towards optimized antacid therapy  Interventions: . Discussed lack of reflux symptoms and ability to use omeprazole as needed instead of  scheduled  Patient Self Care Activities:  . Patient will stop taking omeprazole on scheduled basis . Patient will monitor for symptoms of GERD and take omeprazole as needed  Initial goal documentation    . Overactive bladder: reduce nocturia       CARE PLAN ENTRY  Current Barriers:  . Chronic Disease Management support, education, and care coordination needs related to overactive bladder . Current regimen: oxybutynin 5 mg daily AM  Pharmacist Clinical Goal(s):  Marland Kitchen Over the next 90 days, patient will work with PharmD and providers towards optimized OAB therapy  Interventions: . Discussed benefits of moving oxybutynin to nighttime given reported issues with waking up to go to the bathroom  Patient Self Care Activities:  . Patient will move oxybutynin to bedtime  . Patent will monitor for changes in nocturia  Initial goal documentation    . Smoking cessation       CARE PLAN ENTRY (see longitudinal plan of care for additional care plan information)  Current Barriers:  . Tobacco abuse of 48 years; currently smoking 1/5 ppd . Previous quit attempts, unsuccessful using NRT (gum), bupropion . Reports smoking within 30 minutes of waking up . Reports triggers to smoke include: habit and boredom . Reports motivation to quit smoking includes: improve health . On a scale of 1-10, reports MOTIVATION to quit is 2  Pharmacist Clinical Goal(s):  Marland Kitchen Over the next 90 days, patient will work with PharmD and provider towards tobacco cessation  Interventions: . Comprehensive medication review performed, medication list in electronic medical record updated . Inter-disciplinary care team collaboration (see longitudinal plan of care) . Discussed nicotine replacement - improved chances of quitting with patches + gum over either alone . Discussed Chantix - best chance of quitting based on data o Most common side effects include vivid dreams o More recent studies show no increased risk of  psychiatric effects over placebo  Patient Self Care Activities:  . Patient is not ready to quit today . Patient will contact providers if she decides she wants to quit smoking  Initial goal documentation        Hypertension/Atrial fibrillation   Office blood pressures are  BP Readings from Last 3 Encounters:  10/13/19 116/78  07/28/19 (!) 148/78  07/24/19 118/62   Kidney Function Lab Results  Component Value Date   CREATININE 0.95 07/28/2019   CREATININE 1.09 (H) 04/16/2019   CREATININE 1.05 (H) 08/16/2018      Component Value Date/Time   GFR 56.79 (L) 07/28/2019 0908   GFRNONAA 51 (L) 08/16/2018 0938   GFRNONAA 53 (L) 10/03/2017 1526    Patient has  failed these meds in the past: losartan, propranolol  Patient is currently controlled on the following medications:   dabigatran 150 mg BID  We discussed signs/symptoms of bleeding, patient denies any issues. Pradaxa is also covered 100% by insurance. Pt reports she has successfully come off of BP and HR meds, she does have pacemaker but still has Afib episodes sometimes.  Plan  Continue current medications    Diabetes   Recent Relevant Labs: Lab Results  Component Value Date/Time   HGBA1C 5.7 (A) 10/13/2019 01:28 PM   HGBA1C 5.9 (A) 07/14/2019 01:31 PM   HGBA1C 5.9 (H) 04/09/2019 08:22 AM   HGBA1C 5.3 02/13/2018 12:00 AM   GFR 56.79 (L) 07/28/2019 09:08 AM   GFR 62.47 10/25/2015 07:41 AM   MICROALBUR 0.4 09/18/2018 11:31 AM    Checking BG: 3 times weekly  Recent FBG Readings: 100  Patient has failed these meds in past: n/a Patient is currently controlled on the following medications:   Metformin ER 500 mg qAM   Last diabetic Eye exam:  Lab Results  Component Value Date/Time   HMDIABEYEEXA No Retinopathy 10/07/2018 12:00 AM    Last diabetic Foot exam: No results found for: HMDIABFOOTEX   We discussed: A1c goal < 7%, pt has had A1c in the 5s for a couple years now, she may not need metformin anymore.  IF metformin is stopped may expect a bump up in A1c but would likely still be less than 7%. Patient follows closely with endocrinologist Dr Stacy [name] and wishes to discuss with him at next appt.  Plan  Continue current medications and control with diet and exercise  Patient will discuss stopping metformin with endocrinologist   Hyperlipidemia/CAD   Lipid Panel     Component Value Date/Time   CHOL 114 07/28/2019 0908   TRIG 98.0 07/28/2019 0908   HDL 66.50 07/28/2019 0908   CHOLHDL 2 07/28/2019 0908   VLDL 19.6 07/28/2019 0908   [place] 28 07/28/2019 0908    The ASCVD Risk score (Goff DC Jr., et al., 2013) failed to calculate for the following reasons:   The valid total cholesterol range is 130 to 320 mg/dL   Patient has failed these meds in past: n/a Patient is currently controlled on the following medications:  Atorvastatin 80 mg daily - Su,M,W,F  We discussed:  Cholesterol goals, patient was having some muscle pains so she decreased to 4x weekly dosing. This is acceptable given LDL well below goal.   Plan  Continue current medications and control with diet and exercise   Hypothyroidism   TSH  Date Value Ref Range Status  10/08/2019 3.69 0.40 - 4.50 mIU/L Final    Patient has failed these meds in past: n/a Patient is currently controlled on the following medications:   Synthroid 112 mcg daily except 1.5 tab on Tuesday  We discussed:  TSH is in goal range. Follows closely with endocrine.  Plan  Continue current medications   Bipolar disorder   Patient has failed these meds in past: n/a Patient is currently controlled on the following medications:   Buspirone 15 mg daily  Fluoxetine 20 mg daily  Lithium CR 300 mg daily  We discussed:  Patient does endorse "essential tremor", she has been told it is not likely due to lithium, and patient does not want to stop lithium. Discussed benefits of exercise and physical therapy for managing tremor. If tremor worsens  she will contact provider.  Plan  Continue current medications   Dementia  Patient is currently controlled on the following medications:   Donepezil 10 mg HS  We discussed:  Daughter reports memory issues are stable.  Plan  Continue current medications   GERD   Patient has failed these meds in past: n/a Patient is currently controlled on the following medications:   Omeprazole 40 mg - 3x per week  We discussed:  Pt denies issues with heartburn, she had started taper because of this and now wishes to stop entirely and use PRN only.  Plan  Recommended to stop scheduled omeprazole and take PRN   Overactive bladder   Patient has failed these meds in past: n/a Patient is currently uncontrolled on the following medications:   Oxybutynin 5 mg daily AM  We discussed:  Pt reports she wakes up around 2am to go the bathroom and every few hours after that. Discussed benefits of HS dosing.  Plan  Recommended to move oxybutynin to bedtime due to nocturia  Allergies   Patient has failed these meds in past: n/a Patient is currently controlled on the following medications:   Montelukast 10 mg PRN  Loratadine 10 mg daily  We discussed:  Patient takes loratadine daily and montelukast only for more severe allergy symptoms.  Plan  Continue current medications   Tobacco Abuse   Tobacco Status:  Social History   Tobacco Use  Smoking Status Current Every Day Smoker  . Packs/day: 3.00  . Years: 48.00  . Pack years: 144.00  . Types: Cigarettes  Smokeless Tobacco Never Used  Tobacco Comment   3 cigarettes per day   Patient smokes Within 30 minutes of waking Patient triggers include: habit, boredom Patient is currently unmotivated to quit.  Patient has failed these meds in past: nicotine gum, bupropion  We discussed:  Patient smokes 3 cigarettes per day. She has tried quitting before with gum and bupropion. Discussed benefits of NRT with patches and gum  together, and Chantix.   Plan  Patient will contact provider if she decides she wants to quit.   Topicals   Patient is currently controlled on the following medications:   Diclofenac 1% gel  hydrocortisone 2.5% rectal cream  Ketoconazole 2% cream  Mupirocin 2% ointment  Zaditor eye drops  Systane eye drops  Alra deodarant  We discussed:  Patient is satisfied with current topical regimen and denies issues, uses as needed.   Plan  Continue current medications   Health Maintenance   Patient is currently controlled on the following medications:   Melatonin 5 mg daily - 1/2 tablet HS  Biotin 10,000 mcg daily  Calcium citrate-Vitamin D 1 tablet daily  Multivitamin  Vitamin B6 100 AM and 500 HSmg daily  Vitamin B1 250 HS  Vitamin B12 1000 AM  Ensure - 237 mL BID  Tylenol 500 mg q6h PRN  We discussed:  Patient takes B vitamins to help with tremor. Patient is satisfied with current OTC regimen and denies issues  Plan  Continue current medications    Medication Management   Pt uses [employer] mail order pharmacy for all medications [facility] for acutes.  Uses pill box? Yes - daughter sets up for her Pt endorses 100% compliance  We discussed: all medications are $0 through [facility] mail order, pt is satisfied with pharmacy services and daughter endorses compliance with meds via pill box.  Plan  Continue current medication management strategy    Follow up: 3 month phone visit  Charlene Brooke, PharmD Clinical Pharmacist [facility] [phone]

## 2019-12-03 ENCOUNTER — Ambulatory Visit: Payer: Medicare HMO | Admitting: Pharmacist

## 2019-12-03 ENCOUNTER — Other Ambulatory Visit: Payer: Self-pay

## 2019-12-03 DIAGNOSIS — K219 Gastro-esophageal reflux disease without esophagitis: Secondary | ICD-10-CM

## 2019-12-03 DIAGNOSIS — E119 Type 2 diabetes mellitus without complications: Secondary | ICD-10-CM

## 2019-12-03 DIAGNOSIS — F17200 Nicotine dependence, unspecified, uncomplicated: Secondary | ICD-10-CM

## 2019-12-03 DIAGNOSIS — I480 Paroxysmal atrial fibrillation: Secondary | ICD-10-CM

## 2019-12-03 DIAGNOSIS — N3281 Overactive bladder: Secondary | ICD-10-CM

## 2019-12-03 NOTE — Patient Instructions (Addendum)
Visit Information  Thank you for meeting with me to discuss your medications! I look forward to working with you to achieve your health care goals. Below is a summary of what we talked about during the visit:  Goals Addressed            This Visit's Progress   . Atrial fibrillation: improve safety       CARE PLAN ENTRY  Current Barriers:  . Chronic Disease Management support, education, and care coordination needs related to Atrial Fibrillation . Current regimen: dabigatran 150 mg BID  Pharmacist Clinical Goal(s):  Marland Kitchen Over the next 90 days, patient will work with PharmD and providers towards optimized anticoagulation therapy  Interventions: . Comprehensive medication review performed. . Discussed signs and symptoms of bleeding . Discussed avoiding NSAIDs due to bleeding risk  Patient Self Care Activities:  . Patient will continue to monitor for signs and symptoms of bleeding . Patient will contact pharamacist if copay for Pradaxa suddenly increases due to donut hole  Initial goal documentation    . Diabetes: goal A1c < 7%       CARE PLAN ENTRY (see longitudinal plan of care for additional care plan information)  Current Barriers:  . Diabetes: controlled; complicated by chronic medical conditions including Afib, HLD, CAD, tobacco use Lab Results  Component Value Date   HGBA1C 5.7 (A) 10/13/2019   . Current antihyperglycemic regimen: Metformin ER 500 mg qAM  Pharmacist Clinical Goal(s):  Marland Kitchen Over the next 90 days, patient will work with PharmD and primary care provider to address maintain A1c < 7%  Interventions: . Discussed A1c goal and questionable necessity of metformin o Stopping metformin may lead to higher A1c o Patient follows closely with endocrinologist Dr Tobe Sos and plans to discuss at next visit  Patient Self Care Activities:  . Patient will check blood glucose three times weekly , document, and provide at future appointments . Patient will discuss possibly  stopping metformin with endocrinologist . Patient will contact provider with any episodes of hypoglycemia . Patient will report any questions or concerns to provider   Initial goal documentation     . GERD: monitor symptoms       CARE PLAN ENTRY  Current Barriers:  . Chronic Disease Management support, education, and care coordination needs related to GERD . Current regimen: omeprazole 40 mg on Mon, Wed, Fri  Pharmacist Clinical Goal(s):  Marland Kitchen Over the next 90 days, patient will work with PharmD and providers towards optimized antacid therapy  Interventions: . Discussed lack of reflux symptoms and ability to use omeprazole as needed instead of scheduled  Patient Self Care Activities:  . Patient will stop taking omeprazole on scheduled basis . Patient will monitor for symptoms of GERD and take omeprazole as needed  Initial goal documentation    . Overactive bladder: reduce nocturia       CARE PLAN ENTRY  Current Barriers:  . Chronic Disease Management support, education, and care coordination needs related to overactive bladder . Current regimen: oxybutynin 5 mg daily AM  Pharmacist Clinical Goal(s):  Marland Kitchen Over the next 90 days, patient will work with PharmD and providers towards optimized OAB therapy  Interventions: . Discussed benefits of moving oxybutynin to nighttime given reported issues with waking up to go to the bathroom  Patient Self Care Activities:  . Patient will move oxybutynin to bedtime  . Patent will monitor for changes in nocturia  Initial goal documentation    . Smoking cessation  CARE PLAN ENTRY (see longitudinal plan of care for additional care plan information)  Current Barriers:  . Tobacco abuse of 48 years; currently smoking 1/5 ppd . Previous quit attempts, unsuccessful using NRT (gum), bupropion  Pharmacist Clinical Goal(s):  Marland Kitchen Over the next 90 days, patient will work with PharmD and provider towards tobacco  cessation  Interventions: . Discussed nicotine replacement - improved chances of quitting with patches + gum over either alone . Discussed Chantix - best chance of quitting based on data o Most common side effects include vivid dreams o More recent studies show no increased risk of psychiatric effects over placebo  Patient Self Care Activities:  . Patient is not ready to quit today . Patient will contact providers if she decides she wants to quit smoking  Initial goal documentation       Stacy [name] was given information about Chronic Care Management services today including:  1. CCM service includes personalized support from designated clinical staff supervised by her physician, including individualized plan of care and coordination with other care providers 2. 24/7 contact phone numbers for assistance for urgent and routine care needs. 3. Standard insurance, coinsurance, copays and deductibles apply for chronic care management only during months in which we provide at least 20 minutes of these services. Most insurances cover these services at 100%, however patients may be responsible for any copay, coinsurance and/or deductible if applicable. This service may help you avoid the need for more expensive face-to-face services. 4. Only one practitioner may furnish and bill the service in a calendar month. 5. The patient may stop CCM services at any time (effective at the end of the month) by phone call to the office staff.  Patient agreed to services and verbal consent obtained.   The patient verbalized understanding of instructions provided today and agreed to receive a mailed copy of patient instruction and/or educational materials. Telephone follow up appointment with pharmacy team member scheduled for: 3 months  Charlene Brooke, PharmD Clinical Pharmacist [facility] [phone]    Steps to Quit Smoking Smoking tobacco is the leading cause of  preventable death. It can affect almost every organ in the body. Smoking puts you and those around you at risk for developing many serious chronic diseases. Quitting smoking can be difficult, but it is one of the best things that you can do for your health. It is never too late to quit. How do I get ready to quit? When you decide to quit smoking, create a plan to help you succeed. Before you quit:  Pick a date to quit. Set a date within the next 2 weeks to give you time to prepare.  Write down the reasons why you are quitting. Keep this list in places where you will see it often.  Tell your family, friends, and co-workers that you are quitting. Support from your loved ones can make quitting easier.  Talk with your health care provider about your options for quitting smoking.  Find out what treatment options are covered by your health insurance.  Identify people, places, things, and activities that make you want to smoke (triggers). Avoid them. What first steps can I take to quit smoking?  Throw away all cigarettes at home, at work, and in your car.  Throw away smoking accessories, such as Scientist, research (medical).  Clean your car. Make sure to empty the ashtray.  Clean your home, including curtains and carpets. What strategies can I use to quit smoking?  Talk with your health care provider about combining strategies, such as taking medicines while you are also receiving in-person counseling. Using these two strategies together makes you more likely to succeed in quitting than if you used either strategy on its own.  If you are pregnant or breastfeeding, talk with your health care provider about finding counseling or other support strategies to quit smoking. Do not take medicine to help you quit smoking unless your health care provider tells you to do so. To quit smoking: Quit right away  Quit smoking completely, instead of gradually reducing how much you smoke over a period of time.  Research shows that stopping smoking right away is more successful than gradually quitting.  Attend in-person counseling to help you build problem-solving skills. You are more likely to succeed in quitting if you attend counseling sessions regularly. Even short sessions of 10 minutes can be effective. Take medicine You may take medicines to help you quit smoking. Some medicines require a prescription and some you can purchase over-the-counter. Medicines may have nicotine in them to replace the nicotine in cigarettes. Medicines may:  Help to stop cravings.  Help to relieve withdrawal symptoms. Your health care provider may recommend:  Nicotine patches, gum, or lozenges.  Nicotine inhalers or sprays.  Non-nicotine medicine that is taken by mouth. Find resources Find resources and support systems that can help you to quit smoking and remain smoke-free after you quit. These resources are most helpful when you use them often. They include:  Online chats with a Social worker.  Telephone quitlines.  Printed Furniture conservator/restorer.  Support groups or group counseling.  Text messaging programs.  Mobile phone apps or applications. Use apps that can help you stick to your quit plan by providing reminders, tips, and encouragement. There are many free apps for mobile devices as well as websites. Examples include Quit Guide from the [employer] and smokefree.gov What things can I do to make it easier to quit?   Reach out to your family and friends for support and encouragement. Call telephone quitlines (1-800-QUIT-NOW), reach out to support groups, or work with a counselor for support.  Ask people who smoke to avoid smoking around you.  Avoid places that trigger you to smoke, such as bars, parties, or smoke-break areas at work.  Spend time with people who do not smoke.  Lessen the stress in your life. Stress can be a smoking trigger for some people. To lessen stress, try: ? Exercising  regularly. ? Doing deep-breathing exercises. ? Doing yoga. ? Meditating. ? Performing a body scan. This involves closing your eyes, scanning your body from head to toe, and noticing which parts of your body are particularly tense. Try to relax the muscles in those areas. How will I feel when I quit smoking? Day 1 to 3 weeks Within the first 24 hours of quitting smoking, you may start to feel withdrawal symptoms. These symptoms are usually most noticeable 2-3 days after quitting, but they usually do not last for more than 2-3 weeks. You may experience these symptoms:  Mood swings.  Restlessness, anxiety, or irritability.  Trouble concentrating.  Dizziness.  Strong cravings for sugary foods and nicotine.  Mild weight gain.  Constipation.  Nausea.  Coughing or a sore throat.  Changes in how the medicines that you take for unrelated issues work in your body.  Depression.  Trouble sleeping (insomnia). Week 3 and afterward After the first 2-3 weeks of quitting, you may start to notice more positive results, such  as:  Improved sense of smell and taste.  Decreased coughing and sore throat.  Slower heart rate.  Lower blood pressure.  Clearer skin.  The ability to breathe more easily.  Fewer sick days. Quitting smoking can be very challenging. Do not get discouraged if you are not successful the first time. Some people need to make many attempts to quit before they achieve long-term success. Do your best to stick to your quit plan, and talk with your health care provider if you have any questions or concerns. Summary  Smoking tobacco is the leading cause of preventable death. Quitting smoking is one of the best things that you can do for your health.  When you decide to quit smoking, create a plan to help you succeed.  Quit smoking right away, not slowly over a period of time.  When you start quitting, seek help from your health care provider, family, or friends. This  information is not intended to replace advice given to you by your health care provider. Make sure you discuss any questions you have with your health care provider. Document Revised: 04/18/2019 Document Reviewed: 10/12/2018 Elsevier Patient Education  [place].

## 2019-12-03 NOTE — Addendum Note (Signed)
Addended by: Karle Barr on: 12/03/2019 01:36 PM   Modules accepted: Orders

## 2019-12-04 DIAGNOSIS — M1712 Unilateral primary osteoarthritis, left knee: Secondary | ICD-10-CM | POA: Diagnosis not present

## 2019-12-11 DIAGNOSIS — M1712 Unilateral primary osteoarthritis, left knee: Secondary | ICD-10-CM | POA: Diagnosis not present

## 2019-12-11 DIAGNOSIS — F3174 Bipolar disorder, in full remission, most recent episode manic: Secondary | ICD-10-CM | POA: Diagnosis not present

## 2019-12-24 ENCOUNTER — Other Ambulatory Visit: Payer: Self-pay | Admitting: Internal Medicine

## 2020-01-01 ENCOUNTER — Other Ambulatory Visit: Payer: Self-pay | Admitting: Internal Medicine

## 2020-01-02 NOTE — Telephone Encounter (Signed)
Pt last saw Dr Caryl Comes 02/25/19, last labs 07/28/19 Creat 0.95, age 79, weight 47.3kg, CrCl 35.86, based on specified criteria pt is on appropriate dosage Pradaxa 150mg  BID.

## 2020-01-13 DIAGNOSIS — F3174 Bipolar disorder, in full remission, most recent episode manic: Secondary | ICD-10-CM | POA: Diagnosis not present

## 2020-01-21 NOTE — Progress Notes (Signed)
Radiation Oncology         [phone] ________________________________  Name: Stacy [name] MRN: [id number]  Date: 01/22/2020  DOB: 23-Feb-1941  Follow-Up Visit Note  CC: Binnie Rail, MD  Armandina Gemma, MD    ICD-10-CM   1. Ductal carcinoma in situ (DCIS) of right breast  D05.11     Diagnosis: High-grade, DCIS of right breast, ER/PR (neg) Pathological stage:pTis, pNX  Interval Since Last Radiation: Two years, two months, and one day  Radiation treatment dates:  10/24/2017 - 11/20/2017  Site/dose: 1. Right breast / 2.67 Gy x 15 fractions for a total dose of 40.05 Gy 2. Boost / 2 Gy x 5 fractions, for a total dose of 10 Gy  Narrative:  The patient returns today for routine follow-up. No significant interval history since her last visit with the exception of chronic care management.              On review of systems, she reports feeling well. She denies pain within the left or right breast.  She denies any nipple discharge or bleeding.  Patient did undergo mammography in the summer last year showing benign calcifications.  She is not following up with surgery or medical oncology.  ALLERGIES:  is allergic to penicillins, latex, adhesive [tape], codeine, metronidazole, and other.  Meds: Current Outpatient Medications  Medication Sig Dispense Refill   acetaminophen (TYLENOL) 500 MG tablet Take 500 mg by mouth every 6 (six) hours as needed (for pain.).     Alcohol Swabs (B-D SINGLE USE SWABS REGULAR) PADS USE TWICE DAILY 200 each 2   atorvastatin (LIPITOR) 80 MG tablet Take 1 tablet (80 mg total) by mouth every Monday,Wednesday,Friday, and Sunday at 6 PM. 48 tablet 0   Biotin 10000 MCG TABS Take 10,000 mcg by mouth daily.     busPIRone (BUSPAR) 15 MG tablet Take 15 mg by mouth daily.      CALCIUM CITRATE-VITAMIN D PO Take 1 tablet by mouth daily.      diclofenac Sodium (VOLTAREN) 1 % GEL Apply 4 g topically 4 (four) times daily. 100 g 5   donepezil (ARICEPT)  10 MG tablet TAKE 1 TABLET AT BEDTIME 90 tablet 1   ENSURE (ENSURE) Take 237 mLs by mouth 2 (two) times daily between meals. 42660 mL 3   FLUoxetine (PROZAC) 20 MG tablet Take 20 mg by mouth daily.     glucose blood (ACCU-CHEK AVIVA PLUS) test strip TEST BLOOD SUGAR TWICE DAILY 200 strip 1   hydrocortisone (ANUSOL-HC) 2.5 % rectal cream PLACE 1 APPLICATION RECTALLY 2 (TWO) TIMES DAILY. 30 g 0   ketotifen (ZADITOR) 0.025 % ophthalmic solution Place 1 drop into both eyes as needed.     lithium carbonate (LITHOBID) 300 MG CR tablet Take 300 mg by mouth daily.     loratadine (CLARITIN) 10 MG tablet Take 10 mg by mouth daily.     Melatonin 5 MG TABS Take 1 tablet by mouth daily.      metFORMIN (GLUCOPHAGE-XR) 500 MG 24 hr tablet Take 1 tablet (500 mg total) by mouth daily with breakfast. 90 tablet 3   montelukast (SINGULAIR) 10 MG tablet Take 1 tablet (10 mg total) by mouth at bedtime. 30 tablet 3   Multiple Vitamin (MULTIVITAMIN WITH MINERALS) TABS tablet Take 1 tablet by mouth daily.      non-metallic deodorant (ALRA) MISC Apply 1 application topically daily as needed.     oxybutynin (DITROPAN) 5 MG tablet TAKE 1 TABLET TWICE  DAILY (Patient taking differently: Take 5 mg by mouth at bedtime. ) 180 tablet 1   Polyethyl Glycol-Propyl Glycol (SYSTANE ULTRA) 0.4-0.3 % SOLN Place 1-2 drops into both eyes 3 (three) times daily as needed (for dry/irritated eyes.).     PRADAXA 150 MG CAPS capsule TAKE 1 CAPSULE TWICE DAILY 180 capsule 1   PRODIGY TWIST TOP LANCETS 28G MISC CHECK BLOOD SUGAR EVERY DAY 100 each 1   pyridoxine (B-6) 500 MG tablet 100 mg AM and 500 mg HS     SYNTHROID 112 MCG tablet TAKE 1 TABLET 5 DAYS A WEEK AND 1 AND 1/2 TABLETS 2 DAYS A WEEK (Patient taking differently: Take 112 mcg by mouth. 1 tablet daily except 1.5 tablets once a week) 104 tablet 1   Thiamine HCl (VITAMIN B-1) 250 MG tablet Take 250 mg by mouth at bedtime.     vitamin B-12 (CYANOCOBALAMIN) 1000 MCG  tablet Take 1,000 mcg by mouth daily.     mupirocin ointment (BACTROBAN) 2 % Apply 1 application topically 2 (two) times daily. (Patient not taking: Reported on 12/03/2019) 22 g 0   No current facility-administered medications for this encounter.    Physical Findings: The patient is in no acute distress. Patient is alert and oriented.  height is 5\' 6"  (1.676 m) and weight is 102 lb 4 oz (46.4 kg). Her temporal temperature is 97.5 F (36.4 C) (abnormal). Her blood pressure is 141/63 (abnormal) and her pulse is 59 (abnormal). Her respiration is 18 and oxygen saturation is 100%.   Lungs are clear to auscultation bilaterally. Heart has regular rate and rhythm. No palpable cervical, supraclavicular, or axillary adenopathy. Abdomen soft, non-tender, normal bowel sounds.  Breast Exam Left Breast: No palpable mass, nipple discharge or bleeding. Patient has a pacemaker in the left upper chest.  Right Breast: No palpable mass, nipple discharge or bleeding. Minimal radiation changes.  Lumpectomy scar present in the lower outer quadrant.    Lab Findings: Lab Results  Component Value Date   WBC 6.5 07/28/2019   HGB 14.2 07/28/2019   HCT 42.9 07/28/2019   MCV 96.3 07/28/2019   PLT 185.0 07/28/2019    Radiographic Findings: No results found.  Impression:  High-grade, DCIS of right breast, ER/PR (neg); Pathological stage:pTis, pNX.   No evidence of recurrence on physical exam.  Plan: The patient's next mammogram is scheduled for 07/14/2020. She will follow-up with radiation oncology shortly after on December 16  Total time spent in this encounter was 21 minutes which included reviewing the patient's interval history, imageing results,  physical examination, and documentation.  ____________________________________  Blair Promise, PhD, MD  This document serves as a record of services personally performed by Gery Pray, MD. It was created on his behalf by Clerance Lav, a trained medical  scribe. The creation of this record is based on the scribe's personal observations and the provider's statements to them. This document has been checked and approved by the attending provider.

## 2020-01-22 ENCOUNTER — Ambulatory Visit (INDEPENDENT_AMBULATORY_CARE_PROVIDER_SITE_OTHER): Payer: Medicare HMO | Admitting: "Endocrinology

## 2020-01-22 ENCOUNTER — Other Ambulatory Visit: Payer: Self-pay

## 2020-01-22 ENCOUNTER — Ambulatory Visit
Admission: RE | Admit: 2020-01-22 | Discharge: 2020-01-22 | Disposition: A | Discharge status: Home / Self Care | Payer: Medicare HMO | Source: Ambulatory Visit | Attending: Radiation Oncology | Admitting: Radiation Oncology

## 2020-01-22 ENCOUNTER — Encounter (INDEPENDENT_AMBULATORY_CARE_PROVIDER_SITE_OTHER): Payer: Self-pay | Admitting: "Endocrinology

## 2020-01-22 ENCOUNTER — Encounter: Payer: Self-pay | Admitting: Radiation Oncology

## 2020-01-22 ENCOUNTER — Ambulatory Visit: Payer: Medicare HMO | Admitting: Internal Medicine

## 2020-01-22 VITALS — BP 141/63 | HR 59 | Temp 97.5°F | Resp 18 | Ht 66.0 in | Wt 102.2 lb

## 2020-01-22 VITALS — BP 126/78 | HR 68 | Ht 64.57 in | Wt 100.6 lb

## 2020-01-22 DIAGNOSIS — E119 Type 2 diabetes mellitus without complications: Secondary | ICD-10-CM

## 2020-01-22 DIAGNOSIS — R432 Parageusia: Secondary | ICD-10-CM | POA: Diagnosis not present

## 2020-01-22 DIAGNOSIS — K141 Geographic tongue: Secondary | ICD-10-CM

## 2020-01-22 DIAGNOSIS — Z7984 Long term (current) use of oral hypoglycemic drugs: Secondary | ICD-10-CM | POA: Insufficient documentation

## 2020-01-22 DIAGNOSIS — R634 Abnormal weight loss: Secondary | ICD-10-CM

## 2020-01-22 DIAGNOSIS — Z923 Personal history of irradiation: Secondary | ICD-10-CM | POA: Insufficient documentation

## 2020-01-22 DIAGNOSIS — R251 Tremor, unspecified: Secondary | ICD-10-CM

## 2020-01-22 DIAGNOSIS — E1165 Type 2 diabetes mellitus with hyperglycemia: Secondary | ICD-10-CM | POA: Diagnosis not present

## 2020-01-22 DIAGNOSIS — Z86000 Personal history of in-situ neoplasm of breast: Secondary | ICD-10-CM | POA: Insufficient documentation

## 2020-01-22 DIAGNOSIS — Z08 Encounter for follow-up examination after completed treatment for malignant neoplasm: Secondary | ICD-10-CM | POA: Diagnosis not present

## 2020-01-22 DIAGNOSIS — E063 Autoimmune thyroiditis: Secondary | ICD-10-CM

## 2020-01-22 DIAGNOSIS — M1712 Unilateral primary osteoarthritis, left knee: Secondary | ICD-10-CM | POA: Diagnosis not present

## 2020-01-22 DIAGNOSIS — Z79899 Other long term (current) drug therapy: Secondary | ICD-10-CM | POA: Diagnosis not present

## 2020-01-22 DIAGNOSIS — M6281 Muscle weakness (generalized): Secondary | ICD-10-CM | POA: Diagnosis not present

## 2020-01-22 DIAGNOSIS — D0511 Intraductal carcinoma in situ of right breast: Secondary | ICD-10-CM

## 2020-01-22 DIAGNOSIS — M1711 Unilateral primary osteoarthritis, right knee: Secondary | ICD-10-CM | POA: Diagnosis not present

## 2020-01-22 DIAGNOSIS — I10 Essential (primary) hypertension: Secondary | ICD-10-CM | POA: Diagnosis not present

## 2020-01-22 DIAGNOSIS — M170 Bilateral primary osteoarthritis of knee: Secondary | ICD-10-CM | POA: Diagnosis not present

## 2020-01-22 LAB — POCT GLYCOSYLATED HEMOGLOBIN (HGB A1C): Hemoglobin A1C: 5.8 % — AB (ref 4.0–5.6)

## 2020-01-22 LAB — POCT GLUCOSE (DEVICE FOR HOME USE): POC Glucose: 189 mg/dl — AB (ref 70–99)

## 2020-01-22 NOTE — Patient Instructions (Signed)
Follow up visit in 4 months.  

## 2020-01-22 NOTE — Patient Instructions (Signed)
Coronavirus (COVID-19) Are you at risk?  Are you at risk for the Coronavirus (COVID-19)?  To be considered HIGH RISK for Coronavirus (COVID-19), you have to meet the following criteria:  . Traveled to [place], [place], [place], [place] or [place]; or in the [place] to [place], [place], [place], or [place]; and have fever, cough, and shortness of breath within the last 2 weeks of travel OR . Been in close contact with a person diagnosed with COVID-19 within the last 2 weeks and have fever, cough, and shortness of breath . IF YOU DO NOT MEET THESE CRITERIA, YOU ARE CONSIDERED LOW RISK FOR COVID-19.  What to do if you are HIGH RISK for COVID-19?  . If you are having a medical emergency, call 911. . Seek medical care right away. Before you go to a doctor's office, urgent care or emergency department, call ahead and tell them about your recent travel, contact with someone diagnosed with COVID-19, and your symptoms. You should receive instructions from your physician's office regarding next steps of care.  . When you arrive at healthcare provider, tell the healthcare staff immediately you have returned from visiting [place], [place], [place], [place] or [place]; or traveled in the [place] to [place], [place], [place], or [place]; in the last two weeks or you have been in close contact with a person diagnosed with COVID-19 in the last 2 weeks.   . Tell the health care staff about your symptoms: fever, cough and shortness of breath. . After you have been seen by a medical provider, you will be either: o Tested for (COVID-19) and discharged home on quarantine except to seek medical care if symptoms worsen, and asked to  - Stay home and avoid contact with others until you get your results (4-5 days)  - Avoid travel on public transportation if possible (such as bus, train, or airplane) or o Sent to the Emergency Department by EMS for evaluation, COVID-19 testing, and possible  admission depending on your condition and test results.  What to do if you are LOW RISK for COVID-19?  Reduce your risk of any infection by using the same precautions used for avoiding the common cold or flu:  . Wash your hands often with soap and warm water for at least 20 seconds.  If soap and water are not readily available, use an alcohol-based hand sanitizer with at least 60% alcohol.  . If coughing or sneezing, cover your mouth and nose by coughing or sneezing into the elbow areas of your shirt or coat, into a tissue or into your sleeve (not your hands). . Avoid shaking hands with others and consider head nods or verbal greetings only. . Avoid touching your eyes, nose, or mouth with unwashed hands.  . Avoid close contact with people who are sick. . Avoid places or events with large numbers of people in one location, like concerts or sporting events. . Carefully consider travel plans you have or are making. . If you are planning any travel outside or inside the [place], visit the CDC's Travelers' Health webpage for the latest health notices. . If you have some symptoms but not all symptoms, continue to monitor at home and seek medical attention if your symptoms worsen. . If you are having a medical emergency, call 911.   ADDITIONAL HEALTHCARE OPTIONS FOR PATIENTS   Telehealth / e-Visit: [email or web address]         [facility]: [phone]  [facility]: [phone]                   [facility]: [phone]   

## 2020-01-22 NOTE — Progress Notes (Signed)
Patient in for follow up. 3 years post radiation. Denies any issues or complaints of. Mammogram is up to date.

## 2020-01-22 NOTE — Progress Notes (Signed)
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 looks good today after increasing her B vitamins.    PLAN:  1. Diagnostic: TFTs again prior to next visit 2. Therapeutic:  Eat Right, exercise right (walk 30-60 minutes per day or use her exercise bike). Continue the levothyroxine dose of 1.5 of the 112 mcg tablets per day for one day each week, but take only one tablet per day for the other 6 days each week. Take your calcium, vitamin D, vitamin B6, and other medications as prescribed. Continue to take B6, 600 mg daily. Take more B-complex vitamins. Consider cyproheptadine.  3. Patient education: We discussed all of her problems today as noted above. I again asked her to stop smoking. She smiled at me, but made no commitment to do so. I also asked her to walk every day.   4. Follow-up: 4 months with me. Please schedule FU visits with Dr. Quay Burow and Dr. Dorethea Clan as they desire.  Level of Service: This visit lasted in excess of 55 minutes. More than 50% of the visit was devoted to counseling.   Tillman Sers, MD, CDE Adult and Pediatric Endocrinology 01/22/2020 1:58 PM

## 2020-01-23 LAB — T4, FREE: Free T4: 1.3 ng/dL (ref 0.8–1.8)

## 2020-01-23 LAB — TSH: TSH: 2.56 mIU/L (ref 0.40–4.50)

## 2020-01-23 LAB — T3, FREE: T3, Free: 2.7 pg/mL (ref 2.3–4.2)

## 2020-01-29 ENCOUNTER — Other Ambulatory Visit: Payer: Self-pay | Admitting: Internal Medicine

## 2020-01-29 NOTE — Patient Instructions (Addendum)
  Blood work was ordered.   ° ° °Medications reviewed and updated.  Changes include :   none ° ° ° °Please followup in 6 months ° ° °
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
## 2020-01-30 ENCOUNTER — Ambulatory Visit (INDEPENDENT_AMBULATORY_CARE_PROVIDER_SITE_OTHER): Payer: Medicare HMO | Admitting: Internal Medicine

## 2020-01-30 ENCOUNTER — Encounter: Payer: Self-pay | Admitting: Internal Medicine

## 2020-01-30 ENCOUNTER — Other Ambulatory Visit: Payer: Self-pay

## 2020-01-30 VITALS — BP 130/72 | HR 62 | Temp 98.0°F | Ht 64.57 in | Wt 103.0 lb

## 2020-01-30 DIAGNOSIS — I480 Paroxysmal atrial fibrillation: Secondary | ICD-10-CM

## 2020-01-30 DIAGNOSIS — I2510 Atherosclerotic heart disease of native coronary artery without angina pectoris: Secondary | ICD-10-CM | POA: Diagnosis not present

## 2020-01-30 DIAGNOSIS — F17200 Nicotine dependence, unspecified, uncomplicated: Secondary | ICD-10-CM

## 2020-01-30 DIAGNOSIS — N3281 Overactive bladder: Secondary | ICD-10-CM

## 2020-01-30 DIAGNOSIS — E063 Autoimmune thyroiditis: Secondary | ICD-10-CM | POA: Diagnosis not present

## 2020-01-30 DIAGNOSIS — E119 Type 2 diabetes mellitus without complications: Secondary | ICD-10-CM

## 2020-01-30 DIAGNOSIS — F0390 Unspecified dementia without behavioral disturbance: Secondary | ICD-10-CM | POA: Diagnosis not present

## 2020-01-30 DIAGNOSIS — I10 Essential (primary) hypertension: Secondary | ICD-10-CM | POA: Diagnosis not present

## 2020-01-30 DIAGNOSIS — F03A0 Unspecified dementia, mild, without behavioral disturbance, psychotic disturbance, mood disturbance, and anxiety: Secondary | ICD-10-CM

## 2020-01-30 DIAGNOSIS — E782 Mixed hyperlipidemia: Secondary | ICD-10-CM

## 2020-01-30 DIAGNOSIS — Z9861 Coronary angioplasty status: Secondary | ICD-10-CM

## 2020-01-30 LAB — CBC WITH DIFFERENTIAL/PLATELET
Basophils Absolute: 0.1 10*3/uL (ref 0.0–0.1)
Basophils Relative: 0.9 % (ref 0.0–3.0)
Eosinophils Absolute: 0.1 10*3/uL (ref 0.0–0.7)
Eosinophils Relative: 1 % (ref 0.0–5.0)
HCT: 41.8 % (ref 36.0–46.0)
Hemoglobin: 14 g/dL (ref 12.0–15.0)
Lymphocytes Relative: 15.4 % (ref 12.0–46.0)
Lymphs Abs: 1.5 10*3/uL (ref 0.7–4.0)
MCHC: 33.5 g/dL (ref 30.0–36.0)
MCV: 96.9 fl (ref 78.0–100.0)
Monocytes Absolute: 0.7 10*3/uL (ref 0.1–1.0)
Monocytes Relative: 7.5 % (ref 3.0–12.0)
Neutro Abs: 7.1 10*3/uL (ref 1.4–7.7)
Neutrophils Relative %: 75.2 % (ref 43.0–77.0)
Platelets: 184 10*3/uL (ref 150.0–400.0)
RBC: 4.32 Mil/uL (ref 3.87–5.11)
RDW: 13 % (ref 11.5–15.5)
WBC: 9.4 10*3/uL (ref 4.0–10.5)

## 2020-01-30 LAB — COMPREHENSIVE METABOLIC PANEL
ALT: 19 U/L (ref 0–35)
AST: 22 U/L (ref 0–37)
Albumin: 4.1 g/dL (ref 3.5–5.2)
Alkaline Phosphatase: 51 U/L (ref 39–117)
BUN: 26 mg/dL — ABNORMAL HIGH (ref 6–23)
CO2: 31 mEq/L (ref 19–32)
Calcium: 11.1 mg/dL — ABNORMAL HIGH (ref 8.4–10.5)
Chloride: 102 mEq/L (ref 96–112)
Creatinine, Ser: 1.04 mg/dL (ref 0.40–1.20)
GFR: 51.09 mL/min — ABNORMAL LOW (ref >60.00)
Glucose, Bld: 106 mg/dL — ABNORMAL HIGH (ref 70–99)
Potassium: 4.3 mEq/L (ref 3.5–5.1)
Sodium: 139 mEq/L (ref 135–145)
Total Bilirubin: 0.3 mg/dL (ref 0.2–1.2)
Total Protein: 7 g/dL (ref 6.0–8.3)

## 2020-01-30 LAB — LIPID PANEL
Cholesterol: 112 mg/dL (ref 0–200)
HDL: 65.2 mg/dL (ref >39.00)
LDL Cholesterol: 26 mg/dL (ref 0–99)
NonHDL: 46.88
Total CHOL/HDL Ratio: 2
Triglycerides: 105 mg/dL (ref 0.0–149.0)
VLDL: 21 mg/dL (ref 0.0–40.0)

## 2020-01-30 NOTE — Assessment & Plan Note (Signed)
Chronic Sugars have been well controlled Management per endocrine

## 2020-01-30 NOTE — Assessment & Plan Note (Signed)
Chronic Taking oxybutynin 5 mg at bedtime, which is helping Continue

## 2020-01-30 NOTE — Assessment & Plan Note (Signed)
Chronic Check lipid panel  Continue daily statin Regular exercise and healthy diet encouraged  

## 2020-01-30 NOTE — Assessment & Plan Note (Signed)
Chronic No concerning symptoms of angina Following with cardiology Continue current medications 

## 2020-01-30 NOTE — Assessment & Plan Note (Signed)
Management per endocrine 

## 2020-01-30 NOTE — Assessment & Plan Note (Signed)
Chronic No concerning symptoms Continue current medications CBC, CMP

## 2020-01-30 NOTE — Assessment & Plan Note (Signed)
Chronic Diagnosed by Dr. Si Raider Continue Aricept

## 2020-01-30 NOTE — Assessment & Plan Note (Signed)
Encourage smoking cessation-she is reluctant to quit

## 2020-01-30 NOTE — Assessment & Plan Note (Signed)
Chronic BP well controlled Current regimen effective and well tolerated Continue current medications at current doses cmp  

## 2020-02-04 DIAGNOSIS — M25562 Pain in left knee: Secondary | ICD-10-CM | POA: Diagnosis not present

## 2020-02-13 ENCOUNTER — Ambulatory Visit (INDEPENDENT_AMBULATORY_CARE_PROVIDER_SITE_OTHER): Payer: Medicare HMO | Admitting: *Deleted

## 2020-02-13 DIAGNOSIS — I442 Atrioventricular block, complete: Secondary | ICD-10-CM

## 2020-02-13 LAB — CUP PACEART REMOTE DEVICE CHECK
Battery Impedance: 230 Ohm
Battery Remaining Longevity: 134 mo
Battery Voltage: 2.8 V
Brady Statistic AP VP Percent: 7 %
Brady Statistic AP VS Percent: 45 %
Brady Statistic AS VP Percent: 0 %
Brady Statistic AS VS Percent: 48 %
Date Time Interrogation Session: 20210709073750
Implantable Lead Implant Date: 20010521
Implantable Lead Implant Date: 20010521
Implantable Lead Location: 753859
Implantable Lead Location: 753860
Implantable Lead Model: 4592
Implantable Lead Model: 6940
Implantable Pulse Generator Implant Date: 20161229
Lead Channel Impedance Value: 1055 Ohm
Lead Channel Impedance Value: 629 Ohm
Lead Channel Pacing Threshold Amplitude: 0.625 V
Lead Channel Pacing Threshold Amplitude: 1.25 V
Lead Channel Pacing Threshold Pulse Width: 0.4 ms
Lead Channel Pacing Threshold Pulse Width: 0.4 ms
Lead Channel Setting Pacing Amplitude: 2 V
Lead Channel Setting Pacing Amplitude: 2.75 V
Lead Channel Setting Pacing Pulse Width: 0.4 ms
Lead Channel Setting Sensing Sensitivity: 4 mV

## 2020-02-16 ENCOUNTER — Encounter (INDEPENDENT_AMBULATORY_CARE_PROVIDER_SITE_OTHER): Payer: Self-pay | Admitting: *Deleted

## 2020-02-16 NOTE — Progress Notes (Signed)
Remote pacemaker transmission.   

## 2020-02-19 ENCOUNTER — Telehealth: Payer: Self-pay | Admitting: Internal Medicine

## 2020-02-19 NOTE — Telephone Encounter (Signed)
   Patient wants information on when she received her last shingles vaccine She would also like to know if she should get vaccine again

## 2020-02-20 NOTE — Telephone Encounter (Signed)
Left message on patient's cell phone number today that we do not have a record of a shingles vaccine on her chart. Asked if she may have gone to a pharmacy and had it done at some point as I don't see it noted anywhere on her current vaccine record.

## 2020-02-25 DIAGNOSIS — M25562 Pain in left knee: Secondary | ICD-10-CM | POA: Diagnosis not present

## 2020-02-26 ENCOUNTER — Other Ambulatory Visit: Payer: Self-pay | Admitting: Internal Medicine

## 2020-02-29 ENCOUNTER — Ambulatory Visit
Admission: EM | Admit: 2020-02-29 | Discharge: 2020-02-29 | Disposition: A | Discharge status: Home / Self Care | Payer: Medicare HMO | Attending: Physician Assistant | Admitting: Physician Assistant

## 2020-02-29 ENCOUNTER — Other Ambulatory Visit: Payer: Self-pay

## 2020-02-29 DIAGNOSIS — T148XXA Other injury of unspecified body region, initial encounter: Secondary | ICD-10-CM | POA: Diagnosis not present

## 2020-02-29 NOTE — ED Triage Notes (Signed)
Pt presents for evaluation of a skin tear on her right foot that occurred today.
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
## 2020-02-29 NOTE — Discharge Instructions (Signed)
4 steristrips applied. Try not to get area wet. Keep steristrips on for 7 days if able to. Dress during night time and activity as needed. Monitor for spreading redness, warmth, fever, pus like drainage, follow up for reevaluation.

## 2020-03-03 DIAGNOSIS — F3174 Bipolar disorder, in full remission, most recent episode manic: Secondary | ICD-10-CM | POA: Diagnosis not present

## 2020-03-09 DIAGNOSIS — M25562 Pain in left knee: Secondary | ICD-10-CM | POA: Diagnosis not present

## 2020-03-17 DIAGNOSIS — M25562 Pain in left knee: Secondary | ICD-10-CM | POA: Diagnosis not present

## 2020-03-20 ENCOUNTER — Other Ambulatory Visit (INDEPENDENT_AMBULATORY_CARE_PROVIDER_SITE_OTHER): Payer: Self-pay | Admitting: "Endocrinology

## 2020-03-20 ENCOUNTER — Other Ambulatory Visit: Payer: Self-pay | Admitting: Internal Medicine

## 2020-03-20 DIAGNOSIS — E049 Nontoxic goiter, unspecified: Secondary | ICD-10-CM

## 2020-03-20 DIAGNOSIS — E210 Primary hyperparathyroidism: Secondary | ICD-10-CM

## 2020-03-20 DIAGNOSIS — E211 Secondary hyperparathyroidism, not elsewhere classified: Secondary | ICD-10-CM

## 2020-03-20 DIAGNOSIS — E063 Autoimmune thyroiditis: Secondary | ICD-10-CM

## 2020-03-23 DIAGNOSIS — M25562 Pain in left knee: Secondary | ICD-10-CM | POA: Diagnosis not present

## 2020-03-23 DIAGNOSIS — F3174 Bipolar disorder, in full remission, most recent episode manic: Secondary | ICD-10-CM | POA: Diagnosis not present

## 2020-03-31 DIAGNOSIS — M25562 Pain in left knee: Secondary | ICD-10-CM | POA: Diagnosis not present

## 2020-04-07 DIAGNOSIS — M25562 Pain in left knee: Secondary | ICD-10-CM | POA: Diagnosis not present

## 2020-04-12 ENCOUNTER — Ambulatory Visit
Admission: EM | Admit: 2020-04-12 | Discharge: 2020-04-12 | Discharge status: Against Medical Advice | Payer: Medicare HMO

## 2020-04-12 ENCOUNTER — Other Ambulatory Visit: Payer: Self-pay

## 2020-04-12 NOTE — ED Notes (Signed)
Patient with skin tear to right forearm. Patient does not want to wait. Advised family warm water soap and neosporin. Please return if area become infected.

## 2020-04-13 ENCOUNTER — Ambulatory Visit
Admission: EM | Admit: 2020-04-13 | Discharge: 2020-04-13 | Disposition: A | Discharge status: Home / Self Care | Payer: Medicare HMO

## 2020-04-13 ENCOUNTER — Other Ambulatory Visit: Payer: Self-pay

## 2020-04-13 ENCOUNTER — Encounter: Payer: Self-pay | Admitting: Emergency Medicine

## 2020-04-13 DIAGNOSIS — S51811A Laceration without foreign body of right forearm, initial encounter: Secondary | ICD-10-CM | POA: Diagnosis not present

## 2020-04-13 DIAGNOSIS — J449 Chronic obstructive pulmonary disease, unspecified: Secondary | ICD-10-CM | POA: Diagnosis not present

## 2020-04-13 NOTE — ED Triage Notes (Signed)
Pt here after hitting her right arm on chair with subsequent skin tear to right forearm that happened yesterday; pt here for wound check

## 2020-04-13 NOTE — Discharge Instructions (Addendum)
Keep area(s) clean and dry. °Return for worsening pain, redness, swelling, discharge, fever. °
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
## 2020-04-15 DIAGNOSIS — L570 Actinic keratosis: Secondary | ICD-10-CM | POA: Diagnosis not present

## 2020-04-15 DIAGNOSIS — D2239 Melanocytic nevi of other parts of face: Secondary | ICD-10-CM | POA: Diagnosis not present

## 2020-04-15 DIAGNOSIS — Z85828 Personal history of other malignant neoplasm of skin: Secondary | ICD-10-CM | POA: Diagnosis not present

## 2020-04-15 DIAGNOSIS — L821 Other seborrheic keratosis: Secondary | ICD-10-CM | POA: Diagnosis not present

## 2020-04-15 DIAGNOSIS — F3174 Bipolar disorder, in full remission, most recent episode manic: Secondary | ICD-10-CM | POA: Diagnosis not present

## 2020-04-15 DIAGNOSIS — D1801 Hemangioma of skin and subcutaneous tissue: Secondary | ICD-10-CM | POA: Diagnosis not present

## 2020-04-15 DIAGNOSIS — D692 Other nonthrombocytopenic purpura: Secondary | ICD-10-CM | POA: Diagnosis not present

## 2020-04-26 ENCOUNTER — Other Ambulatory Visit: Payer: Self-pay | Admitting: Internal Medicine

## 2020-05-10 ENCOUNTER — Other Ambulatory Visit: Payer: Self-pay | Admitting: Internal Medicine

## 2020-05-11 NOTE — Telephone Encounter (Addendum)
Pradaxa 150mg  refill request received. Pt is 79 years old, weight-46.7kg, Crea-1.04 on 01/30/2020, last seen by Dr. Caryl Comes on 02/25/2019-NEEDS AN APPT, Diagnosis-Afib, CrCl-32.35ml/min; dose is appropriate per dosing criteria but pt needs an appt.   Pt needs to see Dr. Caryl Comes since she is overdue. Will send a message to EP Scheduler. Pt has an appt with Dr. Dr. Caryl Comes on 07/16/2020 at 1130am, will send a refill until appt.

## 2020-05-13 ENCOUNTER — Telehealth: Payer: Self-pay | Admitting: Internal Medicine

## 2020-05-13 ENCOUNTER — Encounter: Payer: Self-pay | Admitting: Internal Medicine

## 2020-05-13 NOTE — Telephone Encounter (Signed)
Forms have been completed & Placed in providers box to review and sign.  

## 2020-05-13 NOTE — Telephone Encounter (Signed)
I have received these forms.

## 2020-05-13 NOTE — Telephone Encounter (Signed)
Daughter Stacy [name] dropped off [place] paperwork for her Mom.  Stacy [name] would like a copy of the forms when complete. Please call her at [phone]

## 2020-05-14 ENCOUNTER — Ambulatory Visit (INDEPENDENT_AMBULATORY_CARE_PROVIDER_SITE_OTHER): Payer: Medicare HMO

## 2020-05-14 DIAGNOSIS — I442 Atrioventricular block, complete: Secondary | ICD-10-CM | POA: Diagnosis not present

## 2020-05-14 NOTE — Telephone Encounter (Signed)
See other TE.

## 2020-05-14 NOTE — Telephone Encounter (Signed)
Forms have been signed, Faxed, Copy sent to scan &Charged for under daughter.   Daughter Joseph Art has been informed and will pick up original.

## 2020-05-15 LAB — CUP PACEART REMOTE DEVICE CHECK
Battery Impedance: 278 Ohm
Battery Remaining Longevity: 127 mo
Battery Voltage: 2.8 V
Brady Statistic AP VP Percent: 8 %
Brady Statistic AP VS Percent: 43 %
Brady Statistic AS VP Percent: 0 %
Brady Statistic AS VS Percent: 49 %
Date Time Interrogation Session: 20211008074130
Implantable Lead Implant Date: 20010521
Implantable Lead Implant Date: 20010521
Implantable Lead Location: 753859
Implantable Lead Location: 753860
Implantable Lead Model: 4592
Implantable Lead Model: 6940
Implantable Pulse Generator Implant Date: 20161229
Lead Channel Impedance Value: 1087 Ohm
Lead Channel Impedance Value: 619 Ohm
Lead Channel Pacing Threshold Amplitude: 0.5 V
Lead Channel Pacing Threshold Amplitude: 1.25 V
Lead Channel Pacing Threshold Pulse Width: 0.4 ms
Lead Channel Pacing Threshold Pulse Width: 0.4 ms
Lead Channel Setting Pacing Amplitude: 2 V
Lead Channel Setting Pacing Amplitude: 2.75 V
Lead Channel Setting Pacing Pulse Width: 0.4 ms
Lead Channel Setting Sensing Sensitivity: 4 mV

## 2020-05-18 NOTE — Progress Notes (Signed)
Remote pacemaker transmission.   

## 2020-06-01 ENCOUNTER — Telehealth: Payer: Self-pay | Admitting: "Endocrinology

## 2020-06-01 NOTE — Telephone Encounter (Signed)
I called the patient to ask her to come in at 1 PM tomorrow.  Tillman Sers, MD, CDE

## 2020-06-02 ENCOUNTER — Ambulatory Visit (INDEPENDENT_AMBULATORY_CARE_PROVIDER_SITE_OTHER): Payer: Medicare HMO | Admitting: "Endocrinology

## 2020-06-02 ENCOUNTER — Encounter (INDEPENDENT_AMBULATORY_CARE_PROVIDER_SITE_OTHER): Payer: Self-pay | Admitting: "Endocrinology

## 2020-06-02 ENCOUNTER — Other Ambulatory Visit: Payer: Self-pay

## 2020-06-02 VITALS — BP 120/74 | HR 60 | Wt 100.8 lb

## 2020-06-02 DIAGNOSIS — E559 Vitamin D deficiency, unspecified: Secondary | ICD-10-CM | POA: Diagnosis not present

## 2020-06-02 DIAGNOSIS — H9193 Unspecified hearing loss, bilateral: Secondary | ICD-10-CM | POA: Diagnosis not present

## 2020-06-02 DIAGNOSIS — I10 Essential (primary) hypertension: Secondary | ICD-10-CM

## 2020-06-02 DIAGNOSIS — E049 Nontoxic goiter, unspecified: Secondary | ICD-10-CM

## 2020-06-02 DIAGNOSIS — E119 Type 2 diabetes mellitus without complications: Secondary | ICD-10-CM | POA: Diagnosis not present

## 2020-06-02 DIAGNOSIS — E211 Secondary hyperparathyroidism, not elsewhere classified: Secondary | ICD-10-CM

## 2020-06-02 DIAGNOSIS — E063 Autoimmune thyroiditis: Secondary | ICD-10-CM

## 2020-06-02 DIAGNOSIS — R634 Abnormal weight loss: Secondary | ICD-10-CM | POA: Diagnosis not present

## 2020-06-02 DIAGNOSIS — E785 Hyperlipidemia, unspecified: Secondary | ICD-10-CM

## 2020-06-02 DIAGNOSIS — F3170 Bipolar disorder, currently in remission, most recent episode unspecified: Secondary | ICD-10-CM

## 2020-06-02 DIAGNOSIS — R7303 Prediabetes: Secondary | ICD-10-CM

## 2020-06-02 LAB — POCT GLUCOSE (DEVICE FOR HOME USE): POC Glucose: 139 mg/dl — AB (ref 70–99)

## 2020-06-02 LAB — POCT GLYCOSYLATED HEMOGLOBIN (HGB A1C): Hemoglobin A1C: 6 % — AB (ref 4.0–5.6)

## 2020-06-02 NOTE — Patient Instructions (Signed)
Follow up visit in 3 weeks 

## 2020-06-02 NOTE — Progress Notes (Signed)
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 medications as prescribed. Continue to take B6, 600 mg daily. Take more B-complex vitamins. 3. Patient education: We discussed all of her problems today as noted above. I again asked her to stop smoking. She smiled at me, but made no commitment to do so. I also asked her to walk every day.   4. Follow-up: 4 months with me. Please schedule FU visits with Dr. Quay Burow and Dr. Dorethea Clan as they desire.  Level of Service: This visit lasted in excess of 55 minutes. More than 50% of the visit was devoted to counseling.   Tillman Sers, MD, CDE Adult and Pediatric Endocrinology 06/02/2020 1:38 PM

## 2020-06-03 LAB — COMPREHENSIVE METABOLIC PANEL
AG Ratio: 1.8 (calc) (ref 1.0–2.5)
ALT: 16 U/L (ref 6–29)
AST: 19 U/L (ref 10–35)
Albumin: 4.2 g/dL (ref 3.6–5.1)
Alkaline phosphatase (APISO): 46 U/L (ref 37–153)
BUN/Creatinine Ratio: 21 (calc) (ref 6–22)
BUN: 22 mg/dL (ref 7–25)
CO2: 29 mmol/L (ref 20–32)
Calcium: 10.8 mg/dL — ABNORMAL HIGH (ref 8.6–10.4)
Chloride: 106 mmol/L (ref 98–110)
Creat: 1.06 mg/dL — ABNORMAL HIGH (ref 0.60–0.93)
Globulin: 2.3 g/dL (calc) (ref 1.9–3.7)
Glucose, Bld: 106 mg/dL — ABNORMAL HIGH (ref 65–99)
Potassium: 4.1 mmol/L (ref 3.5–5.3)
Sodium: 140 mmol/L (ref 135–146)
Total Bilirubin: 0.4 mg/dL (ref 0.2–1.2)
Total Protein: 6.5 g/dL (ref 6.1–8.1)

## 2020-06-03 LAB — PTH, INTACT AND CALCIUM
Calcium: 10.8 mg/dL — ABNORMAL HIGH (ref 8.6–10.4)
PTH: 19 pg/mL (ref 14–64)

## 2020-06-03 LAB — T4, FREE: Free T4: 1.2 ng/dL (ref 0.8–1.8)

## 2020-06-03 LAB — T3, FREE: T3, Free: 2.5 pg/mL (ref 2.3–4.2)

## 2020-06-03 LAB — LITHIUM LEVEL: Lithium Lvl: 0.8 mmol/L (ref 0.6–1.2)

## 2020-06-03 LAB — VITAMIN D 25 HYDROXY (VIT D DEFICIENCY, FRACTURES): Vit D, 25-Hydroxy: 56 ng/mL (ref 30–100)

## 2020-06-03 LAB — TSH: TSH: 3.52 m[IU]/L (ref 0.40–4.50)

## 2020-06-08 ENCOUNTER — Encounter (INDEPENDENT_AMBULATORY_CARE_PROVIDER_SITE_OTHER): Payer: Self-pay | Admitting: *Deleted

## 2020-06-11 ENCOUNTER — Other Ambulatory Visit: Payer: Self-pay | Admitting: Internal Medicine

## 2020-06-15 DIAGNOSIS — F3174 Bipolar disorder, in full remission, most recent episode manic: Secondary | ICD-10-CM | POA: Diagnosis not present

## 2020-07-14 ENCOUNTER — Other Ambulatory Visit: Payer: Self-pay

## 2020-07-14 ENCOUNTER — Ambulatory Visit
Admission: RE | Admit: 2020-07-14 | Discharge: 2020-07-14 | Disposition: A | Discharge status: Home / Self Care | Payer: Medicare HMO | Source: Ambulatory Visit | Attending: Internal Medicine | Admitting: Internal Medicine

## 2020-07-14 DIAGNOSIS — Z9889 Other specified postprocedural states: Secondary | ICD-10-CM

## 2020-07-14 DIAGNOSIS — R922 Inconclusive mammogram: Secondary | ICD-10-CM | POA: Diagnosis not present

## 2020-07-16 ENCOUNTER — Ambulatory Visit: Payer: Medicare HMO | Admitting: Internal Medicine

## 2020-07-16 ENCOUNTER — Other Ambulatory Visit: Payer: Self-pay

## 2020-07-16 ENCOUNTER — Other Ambulatory Visit: Payer: Self-pay | Admitting: Internal Medicine

## 2020-07-16 ENCOUNTER — Encounter: Payer: Self-pay | Admitting: Internal Medicine

## 2020-07-16 VITALS — BP 120/70 | HR 60 | Ht 66.0 in | Wt 101.6 lb

## 2020-07-16 DIAGNOSIS — I442 Atrioventricular block, complete: Secondary | ICD-10-CM

## 2020-07-16 DIAGNOSIS — Z950 Presence of cardiac pacemaker: Secondary | ICD-10-CM | POA: Diagnosis not present

## 2020-07-16 LAB — CUP PACEART INCLINIC DEVICE CHECK
Battery Impedance: 278 Ohm
Battery Remaining Longevity: 127 mo
Battery Voltage: 2.8 V
Brady Statistic AP VP Percent: 8 %
Brady Statistic AP VS Percent: 43 %
Brady Statistic AS VP Percent: 0 %
Brady Statistic AS VS Percent: 49 %
Date Time Interrogation Session: 20211210154134
Implantable Lead Implant Date: 20010521
Implantable Lead Implant Date: 20010521
Implantable Lead Location: 753859
Implantable Lead Location: 753860
Implantable Lead Model: 4592
Implantable Lead Model: 6940
Implantable Pulse Generator Implant Date: 20161229
Lead Channel Impedance Value: 641 Ohm
Lead Channel Impedance Value: 963 Ohm
Lead Channel Pacing Threshold Amplitude: 0.75 V
Lead Channel Pacing Threshold Amplitude: 1.25 V
Lead Channel Pacing Threshold Pulse Width: 0.4 ms
Lead Channel Pacing Threshold Pulse Width: 0.4 ms
Lead Channel Sensing Intrinsic Amplitude: 11.2 mV
Lead Channel Sensing Intrinsic Amplitude: 4 mV
Lead Channel Setting Pacing Amplitude: 2 V
Lead Channel Setting Pacing Amplitude: 2.75 V
Lead Channel Setting Pacing Pulse Width: 0.4 ms
Lead Channel Setting Sensing Sensitivity: 4 mV

## 2020-07-16 MED ORDER — DABIGATRAN ETEXILATE MESYLATE 150 MG PO CAPS
150.0000 mg | ORAL_CAPSULE | Freq: Two times a day (BID) | ORAL | 3 refills | Status: DC
Start: 2020-07-16 — End: 2021-06-06
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
## 2020-07-16 NOTE — Telephone Encounter (Signed)
Refill was already addressed by Rosann Auerbach, RN at today's OV visit with Dr. Caryl Comes.

## 2020-07-16 NOTE — Patient Instructions (Signed)

## 2020-07-21 ENCOUNTER — Telehealth: Payer: Self-pay | Admitting: Pharmacist

## 2020-07-21 ENCOUNTER — Other Ambulatory Visit: Payer: Self-pay

## 2020-07-21 NOTE — Progress Notes (Signed)
Chronic Care Management Pharmacy Assistant   Name: Tonnya Garbett  MRN: [id number] DOB: 03-25-1941  Reason for Encounter: General Adherence Call   PCP : Binnie Rail, MD  Allergies:   Allergies  Allergen Reactions  . Penicillins Swelling and Rash    THROAT SWELLING PATIENT HAS HAD A PCN REACTION WITH IMMEDIATE RASH, FACIAL/TONGUE/THROAT SWELLING, SOB, OR LIGHTHEADEDNESS WITH HYPOTENSION:  #  #  #  YES  #  #  #   Has patient had a PCN reaction causing severe rash involving mucus membranes or skin necrosis: No Has patient had a PCN reaction that required hospitalization No Has patient had a PCN reaction occurring within the last 10 years: #  #  #  YES  #  #  #   . Latex Dermatitis    UNDOCUMENTED SEVERITY  . Adhesive [Tape] Rash  . Codeine Nausea And Vomiting  . Metronidazole Nausea And Vomiting  . Other Rash    Bandaids cause rash    Medications: Outpatient Encounter Medications as of 07/21/2020  Medication Sig Note  . ACCU-CHEK AVIVA PLUS test strip TEST BLOOD SUGAR TWICE DAILY   . acetaminophen (TYLENOL) 500 MG tablet Take 500 mg by mouth every 6 (six) hours as needed (for pain.).   Marland Kitchen Alcohol Swabs (B-D SINGLE USE SWABS REGULAR) PADS USE TWICE DAILY   . atorvastatin (LIPITOR) 80 MG tablet TAKE 1 TABLET EVERY DAY ON MONDAY, WEDNESDAY, FRIDAY  AND  SUNDAY  AT  6PM   . Biotin 10000 MCG TABS Take 10,000 mcg by mouth daily.   . busPIRone (BUSPAR) 15 MG tablet Take 15 mg by mouth daily.    Marland Kitchen CALCIUM CITRATE-VITAMIN D PO Take 1 tablet by mouth daily.    . dabigatran (PRADAXA) 150 MG CAPS capsule Take 1 capsule (150 mg total) by mouth 2 (two) times daily.   . diclofenac Sodium (VOLTAREN) 1 % GEL Apply 4 g topically 4 (four) times daily. 06/02/2020: PRN  . donepezil (ARICEPT) 10 MG tablet TAKE 1 TABLET AT BEDTIME   . ENSURE (ENSURE) Take 237 mLs by mouth 2 (two) times daily between meals. 09/24/2018: PRN  . FLUoxetine (PROZAC) 20 MG tablet Take 20 mg by mouth daily.    . hydrocortisone (ANUSOL-HC) 2.5 % rectal cream USE 1 APPLICATION RECTALLY TWICE DAILY   . ketotifen (ZADITOR) 0.025 % ophthalmic solution Place 1 drop into both eyes as needed.   . lithium carbonate (LITHOBID) 300 MG CR tablet Take 300 mg by mouth daily. 04/16/2019: Now takes the XR version  . loratadine (CLARITIN) 10 MG tablet Take 10 mg by mouth daily.   . Melatonin 5 MG TABS Take 1 tablet by mouth daily.  04/16/2019: Takes 1/2 tablet  . metFORMIN (GLUCOPHAGE-XR) 500 MG 24 hr tablet TAKE 1 TABLET EVERY DAY WITH BREAKFAST   . Multiple Vitamin (MULTIVITAMIN WITH MINERALS) TABS tablet Take 1 tablet by mouth daily.    . non-metallic deodorant Jethro Poling) MISC Apply 1 application topically daily as needed.   Marland Kitchen oxybutynin (DITROPAN) 5 MG tablet TAKE 1 TABLET TWICE DAILY   . Polyethyl Glycol-Propyl Glycol 0.4-0.3 % SOLN Place 1-2 drops into both eyes 3 (three) times daily as needed (for dry/irritated eyes.).   Marland Kitchen PRODIGY TWIST TOP LANCETS 28G MISC CHECK BLOOD SUGAR EVERY DAY   . pyridoxine (B-6) 500 MG tablet 100 mg AM and 500 mg HS   . SYNTHROID 112 MCG tablet 1 tablet 6 days a week 1.5 tablet 1 day a  week   . Thiamine HCl (VITAMIN B-1) 250 MG tablet Take 250 mg by mouth at bedtime.   . vitamin B-12 (CYANOCOBALAMIN) 1000 MCG tablet Take 1,000 mcg by mouth daily.    No facility-administered encounter medications on file as of 07/21/2020.    Current Diagnosis: Patient Active Problem List   Diagnosis Date Noted  . Physical deconditioning 03/07/2018  . Hearing loss 01/09/2018  . Mild dementia ([place]) 01/09/2018  . Ductal carcinoma in situ (DCIS) of right breast 09/09/2017  . Closed fracture of nasal bones 08/10/2017  . Closed fracture of left orbital floor ([place]) 08/10/2017  . GERD (gastroesophageal reflux disease) 07/12/2016  . Bilateral shoulder pain 04/26/2016  . Polyarthralgia 04/03/2016  . Greater trochanteric bursitis of left hip 06/17/2015  . Allergic rhinitis 08/04/2013  . Hypothyroidism,  acquired, autoimmune 05/10/2013  . OAB (overactive bladder)   . Primary hyperparathyroidism ([place])   . Vitamin D deficiency disease   . Pacemaker-Medtronic-dual-chamber 11/17/2010  . Diabetes ([place]) 09/08/2010  . ATRIAL FIBRILLATION 11/11/2009  . TOBACCO ABUSE 03/22/2009  . CAD S/P percutaneous coronary angioplasty 03/26/2008  . AV block-complete-intermittent 03/26/2008  . Hyperlipidemia 02/25/2008  . Bipolar disorder ([place]) 02/25/2008  . Essential hypertension 02/25/2008  . DIVERTICULOSIS, COLON 02/25/2008    Goals Addressed   None     Follow-Up:  Pharmacist Review   A general adherence call was made to Ms. Vonstein for a wellness call to see how she has been doing since her last visit from the clinical pharmacy [facility]. The patient states that she has been doing well and has no problems. She does states that she takes her medication as directed but she does have the shakes really bad when he wakes up in the morning, but later in the afternoon not so bad. The patient thinks it could be the lithium but is not sure. The patient states that she is taking metformin and blood sugar has been normal, she is not on any special diet. The patient states that she goes to silver sne[employer] a couple times a week and does exercises that she learned in therapy. The patient also states that she still has a runny nose even while taking allergy medicine. The patient states that other than these issues she is doing well. I let the patient know that I will pass along the information to the clinical pharmacist [facility] Ryder.    Wendy Poet, Clinical Pharmacist Assistant [facility]

## 2020-07-21 NOTE — Assessment & Plan Note (Addendum)
Chronic With hyperlipidemia Very well controlled Management per endocrine ?  Need Metformin
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
## 2020-07-21 NOTE — Patient Instructions (Addendum)
° °  Medications changes include :   None   Your prescription(s) have been submitted to your pharmacy. Please take as directed and contact our office if you believe you are having problem(s) with the medication(s).   Please followup in 6 months  

## 2020-07-22 ENCOUNTER — Ambulatory Visit (INDEPENDENT_AMBULATORY_CARE_PROVIDER_SITE_OTHER): Payer: Medicare HMO | Admitting: Internal Medicine

## 2020-07-22 ENCOUNTER — Ambulatory Visit
Admission: RE | Admit: 2020-07-22 | Discharge: 2020-07-22 | Disposition: A | Discharge status: Home / Self Care | Payer: Medicare HMO | Source: Ambulatory Visit | Attending: Radiation Oncology | Admitting: Radiation Oncology

## 2020-07-22 ENCOUNTER — Ambulatory Visit: Payer: Medicare HMO | Admitting: Internal Medicine

## 2020-07-22 ENCOUNTER — Other Ambulatory Visit: Payer: Self-pay

## 2020-07-22 ENCOUNTER — Encounter: Payer: Self-pay | Admitting: Internal Medicine

## 2020-07-22 ENCOUNTER — Encounter: Payer: Self-pay | Admitting: Radiation Oncology

## 2020-07-22 VITALS — BP 118/70 | HR 62 | Temp 98.0°F | Ht 66.0 in | Wt 101.4 lb

## 2020-07-22 DIAGNOSIS — Z79899 Other long term (current) drug therapy: Secondary | ICD-10-CM | POA: Diagnosis not present

## 2020-07-22 DIAGNOSIS — Z86000 Personal history of in-situ neoplasm of breast: Secondary | ICD-10-CM | POA: Diagnosis not present

## 2020-07-22 DIAGNOSIS — Z9861 Coronary angioplasty status: Secondary | ICD-10-CM

## 2020-07-22 DIAGNOSIS — D0511 Intraductal carcinoma in situ of right breast: Secondary | ICD-10-CM | POA: Insufficient documentation

## 2020-07-22 DIAGNOSIS — E7849 Other hyperlipidemia: Secondary | ICD-10-CM

## 2020-07-22 DIAGNOSIS — Z170 Estrogen receptor positive status [ER+]: Secondary | ICD-10-CM | POA: Insufficient documentation

## 2020-07-22 DIAGNOSIS — E063 Autoimmune thyroiditis: Secondary | ICD-10-CM

## 2020-07-22 DIAGNOSIS — I2510 Atherosclerotic heart disease of native coronary artery without angina pectoris: Secondary | ICD-10-CM | POA: Diagnosis not present

## 2020-07-22 DIAGNOSIS — I480 Paroxysmal atrial fibrillation: Secondary | ICD-10-CM | POA: Diagnosis not present

## 2020-07-22 DIAGNOSIS — Z923 Personal history of irradiation: Secondary | ICD-10-CM | POA: Diagnosis not present

## 2020-07-22 DIAGNOSIS — E1169 Type 2 diabetes mellitus with other specified complication: Secondary | ICD-10-CM

## 2020-07-22 DIAGNOSIS — F0390 Unspecified dementia without behavioral disturbance: Secondary | ICD-10-CM

## 2020-07-22 DIAGNOSIS — Z681 Body mass index (BMI) 19 or less, adult: Secondary | ICD-10-CM

## 2020-07-22 DIAGNOSIS — N3281 Overactive bladder: Secondary | ICD-10-CM | POA: Diagnosis not present

## 2020-07-22 DIAGNOSIS — F03A0 Unspecified dementia, mild, without behavioral disturbance, psychotic disturbance, mood disturbance, and anxiety: Secondary | ICD-10-CM

## 2020-07-22 DIAGNOSIS — Z08 Encounter for follow-up examination after completed treatment for malignant neoplasm: Secondary | ICD-10-CM | POA: Diagnosis not present

## 2020-07-22 DIAGNOSIS — Z7984 Long term (current) use of oral hypoglycemic drugs: Secondary | ICD-10-CM | POA: Insufficient documentation

## 2020-07-22 DIAGNOSIS — I10 Essential (primary) hypertension: Secondary | ICD-10-CM | POA: Diagnosis not present

## 2020-07-22 MED ORDER — ATORVASTATIN CALCIUM 80 MG PO TABS
ORAL_TABLET | ORAL | 3 refills | Status: DC
Start: 2020-07-22 — End: 2021-07-18

## 2020-07-22 NOTE — Assessment & Plan Note (Signed)
Chronic No symptoms suggestive of angina Following with cardiology

## 2020-07-22 NOTE — Assessment & Plan Note (Signed)
Chronic °Stable °Continue Aricept 10 mg nightly °

## 2020-07-22 NOTE — Assessment & Plan Note (Signed)
Chronic Clinically euthyroid Management per endocrine

## 2020-07-22 NOTE — Assessment & Plan Note (Signed)
Chronic She has decreased appetite and is not eating much Encouraged her to eat more Weight is stable

## 2020-07-22 NOTE — Assessment & Plan Note (Signed)
Chronic Asymptomatic Following with cardiology On Pradaxa

## 2020-07-22 NOTE — Progress Notes (Signed)
Radiation Oncology         [phone]) [id number] ________________________________  Name: Stacy [name] MRN: [id number]  Date: 07/22/2020  DOB: February 23, 1941  Follow-Up Visit Note  CC: Binnie Rail, MD  Binnie Rail, MD    ICD-10-CM   1. Ductal carcinoma in situ (DCIS) of right breast  D05.11     Diagnosis: High-grade, DCIS of right breast, ER/PR (neg) Pathological stage:pTis, pNX  Interval Since Last Radiation: Two years and eight months  Radiation treatment dates:  10/24/2017 - 11/20/2017  Site/dose: 1. Right breast / 2.67 Gy x 15 fractions for a total dose of 40.05 Gy 2. Boost / 2 Gy x 5 fractions, for a total dose of 10 Gy  Narrative:  The patient returns today for routine follow-up. Since her last visit, she was seen at Urgent Care on two different occasions; once for skin tear on foot and once for wound check. She underwent a bilateral diagnostic mammogram on 07/14/2020 that did not show any mammographic evidence of malignancy.              On review of systems, she reports no pain within the breast areas.  She denies any nipple discharge or bleeding.. She denies right arm swelling or pain.   ALLERGIES:  is allergic to penicillins, latex, adhesive [tape], codeine, metronidazole, and other.  Meds: Current Outpatient Medications  Medication Sig Dispense Refill   ACCU-CHEK AVIVA PLUS test strip TEST BLOOD SUGAR TWICE DAILY 200 strip 1   acetaminophen (TYLENOL) 500 MG tablet Take 500 mg by mouth every 6 (six) hours as needed (for pain.).     Alcohol Swabs (B-D SINGLE USE SWABS REGULAR) PADS USE TWICE DAILY 200 each 2   atorvastatin (LIPITOR) 80 MG tablet TAKE 1 TABLET EVERY DAY ON MONDAY, WEDNESDAY, FRIDAY  AND  SUNDAY  AT  6PM 48 tablet 3   Biotin 10000 MCG TABS Take 10,000 mcg by mouth daily.     busPIRone (BUSPAR) 15 MG tablet Take 15 mg by mouth daily.      CALCIUM CITRATE-VITAMIN D PO Take 1 tablet by mouth daily.      dabigatran (PRADAXA) 150 MG CAPS  capsule Take 1 capsule (150 mg total) by mouth 2 (two) times daily. 180 capsule 3   diclofenac Sodium (VOLTAREN) 1 % GEL Apply 4 g topically 4 (four) times daily. 100 g 5   donepezil (ARICEPT) 10 MG tablet TAKE 1 TABLET AT BEDTIME 90 tablet 1   ENSURE (ENSURE) Take 237 mLs by mouth 2 (two) times daily between meals. 42660 mL 3   FLUoxetine (PROZAC) 20 MG tablet Take 20 mg by mouth daily.     hydrocortisone (ANUSOL-HC) 2.5 % rectal cream USE 1 APPLICATION RECTALLY TWICE DAILY 30 g 0   ketotifen (ZADITOR) 0.025 % ophthalmic solution Place 1 drop into both eyes as needed.     lithium carbonate (LITHOBID) 300 MG CR tablet Take 300 mg by mouth daily.     loratadine (CLARITIN) 10 MG tablet Take 10 mg by mouth daily.     Melatonin 5 MG TABS Take 1 tablet by mouth daily.      metFORMIN (GLUCOPHAGE-XR) 500 MG 24 hr tablet TAKE 1 TABLET EVERY DAY WITH BREAKFAST 90 tablet 3   Multiple Vitamin (MULTIVITAMIN WITH MINERALS) TABS tablet Take 1 tablet by mouth daily.      non-metallic deodorant (ALRA) MISC Apply 1 application topically daily as needed.     oxybutynin (DITROPAN) 5 MG tablet TAKE 1  TABLET TWICE DAILY (Patient taking differently: at bedtime. Patient takes only one pill daily in the evening) 180 tablet 1   Polyethyl Glycol-Propyl Glycol 0.4-0.3 % SOLN Place 1-2 drops into both eyes 3 (three) times daily as needed (for dry/irritated eyes.).     PRODIGY TWIST TOP LANCETS 28G MISC CHECK BLOOD SUGAR EVERY DAY 100 each 1   pyridoxine (B-6) 500 MG tablet 100 mg HS and 500 mg AM     SYNTHROID 112 MCG tablet 1 tablet 6 days a week 1.5 tablet 1 day a week 96 tablet 1   Thiamine HCl (VITAMIN B-1) 250 MG tablet Take 250 mg by mouth at bedtime.     vitamin B-12 (CYANOCOBALAMIN) 1000 MCG tablet Take 1,000 mcg by mouth daily.     No current facility-administered medications for this encounter.    Physical Findings: The patient is in no acute distress. Patient is alert and oriented.   height is 5\' 6"  (1.676 m) and weight is 101 lb 1.6 oz (45.9 kg). Her temporal temperature is 97.5 F (36.4 C) (abnormal). Her blood pressure is 156/71 (abnormal) and her pulse is 60. Her respiration is 18 and oxygen saturation is 98%.   Lungs are clear to auscultation bilaterally. Heart has regular rate and rhythm. No palpable cervical, supraclavicular, or axillary adenopathy. Abdomen soft, non-tender, normal bowel sounds.  Breast Exam Left Breast: No palpable mass, nipple discharge or bleeding. Patient has a pacemaker in the left upper chest.  Right Breast: No palpable mass, nipple discharge or bleeding. Minimal radiation changes.  Lumpectomy scar present in the lower outer quadrant.    Lab Findings: Lab Results  Component Value Date   WBC 9.4 01/30/2020   HGB 14.0 01/30/2020   HCT 41.8 01/30/2020   MCV 96.9 01/30/2020   PLT 184.0 01/30/2020    Radiographic Findings: MM DIAG BREAST TOMO BILATERAL  Result Date: 07/14/2020 CLINICAL DATA:  79 year old female presenting for routine annual surveillance status post right breast lumpectomy in 2019. EXAM: DIGITAL DIAGNOSTIC BILATERAL MAMMOGRAM WITH TOMO AND CAD COMPARISON:  Previous exam(s). ACR Breast Density Category c: The breast tissue is heterogeneously dense, which may obscure small masses. FINDINGS: The right breast lumpectomy site is stable. No suspicious calcifications, masses or areas of distortion are seen in the bilateral breasts. Mammographic images were processed with CAD. IMPRESSION: Stable right breast lumpectomy site. No mammographic evidence of malignancy in the bilateral breasts. RECOMMENDATION: Diagnostic mammogram is suggested in 1 year. (Code:[id number]) I have discussed the findings and recommendations with the patient. If applicable, a reminder letter will be sent to the patient regarding the next appointment. BI-RADS CATEGORY  2: Benign. Electronically Signed   By: Ammie Ferrier M.D.   On: 07/14/2020 10:17   CUP PACEART  INCLINIC DEVICE CHECK  Result Date: 07/16/2020 Pacemaker check in clinic. Normal device function. Thresholds, sensing, impedances consistent with previous measurements. Device programmed to maximize longevity. 275 mode switch (0.3% burden). 80 AHR episodes, most recent 10/26/19 lasting approximately 7.75 hours with a peak A rate of >400, +OAC.  21 high ventricular rate episodes noted, most recent on 06/04/20, 8 beat run of NSVT.  Device programmed at appropriate safety margins. Histogram distribution appropriate for patient activity level. Device programmed to optimize intrinsic conduction. Estimated longevity 10.5years. Patient enrolled in remote follow-up, next scheduled check 08/13/20.  ROV with Dr. Caryl Comes in 1 year.  Patient education completed.Trena Platt, BSN, RN   Impression:  High-grade, DCIS of right breast, ER/PR (neg); Pathological stage:pTis, pNX.   No  evidence of recurrence on physical exam today.  Plan: The patient will follow up with radiation oncology in 6 months.  She is not following with any other physician concerning her breast cancer.  Total time spent in this encounter was 15 minutes which included reviewing the patient's most recent ED visits,mammogram, physical examination, and documentation.  ____________________________________  Blair Promise, PhD, MD  This document serves as a record of services personally performed by Gery Pray, MD. It was created on his behalf by Clerance Lav, a trained medical scribe. The creation of this record is based on the scribe's personal observations and the provider's statements to them. This document has been checked and approved by the attending provider.

## 2020-07-22 NOTE — Assessment & Plan Note (Signed)
Chronic Controlled Nocturia x1 Continue oxybutynin 5 mg BID

## 2020-07-22 NOTE — Progress Notes (Signed)
Patient here for a f/u visit. She denies pain. Reports mammogram on 12/8 wnl. Reports right breast was burning for a few days after.  BP (!) 156/71   Pulse 60   Temp (!) 97.5 F (36.4 C) (Temporal)   Resp 18   Ht 5\' 6"  (1.676 m)   Wt 101 lb 1.6 oz (45.9 kg)   SpO2 98%   BMI 16.32 kg/m   Wt Readings from Last 3 Encounters:  07/22/20 101 lb 1.6 oz (45.9 kg)  07/22/20 101 lb 6.4 oz (46 kg)  07/16/20 101 lb 9.6 oz (46.1 kg)

## 2020-07-22 NOTE — Assessment & Plan Note (Signed)
Chronic Check lipid panel  Continue atorvastatin 80 mg four times a week Regular exercise and healthy diet encouraged

## 2020-08-05 ENCOUNTER — Other Ambulatory Visit: Payer: Self-pay | Admitting: Internal Medicine

## 2020-08-05 DIAGNOSIS — Z9889 Other specified postprocedural states: Secondary | ICD-10-CM

## 2020-08-13 ENCOUNTER — Ambulatory Visit (INDEPENDENT_AMBULATORY_CARE_PROVIDER_SITE_OTHER): Payer: Medicare HMO

## 2020-08-13 DIAGNOSIS — I442 Atrioventricular block, complete: Secondary | ICD-10-CM

## 2020-08-14 LAB — CUP PACEART REMOTE DEVICE CHECK
Battery Impedance: 279 Ohm
Battery Remaining Longevity: 128 mo
Battery Voltage: 2.79 V
Brady Statistic AP VP Percent: 6 %
Brady Statistic AP VS Percent: 43 %
Brady Statistic AS VP Percent: 0 %
Brady Statistic AS VS Percent: 50 %
Date Time Interrogation Session: 20220107095205
Implantable Lead Implant Date: 20010521
Implantable Lead Implant Date: 20010521
Implantable Lead Location: 753859
Implantable Lead Location: 753860
Implantable Lead Model: 4592
Implantable Lead Model: 6940
Implantable Pulse Generator Implant Date: 20161229
Lead Channel Impedance Value: 1059 Ohm
Lead Channel Impedance Value: 686 Ohm
Lead Channel Pacing Threshold Amplitude: 0.5 V
Lead Channel Pacing Threshold Amplitude: 1.5 V
Lead Channel Pacing Threshold Pulse Width: 0.4 ms
Lead Channel Pacing Threshold Pulse Width: 0.4 ms
Lead Channel Setting Pacing Amplitude: 2 V
Lead Channel Setting Pacing Amplitude: 3 V
Lead Channel Setting Pacing Pulse Width: 0.4 ms
Lead Channel Setting Sensing Sensitivity: 4 mV

## 2020-08-18 ENCOUNTER — Other Ambulatory Visit (INDEPENDENT_AMBULATORY_CARE_PROVIDER_SITE_OTHER): Payer: Self-pay | Admitting: "Endocrinology

## 2020-08-18 ENCOUNTER — Other Ambulatory Visit: Payer: Self-pay | Admitting: Internal Medicine

## 2020-08-18 DIAGNOSIS — E210 Primary hyperparathyroidism: Secondary | ICD-10-CM

## 2020-08-18 DIAGNOSIS — E211 Secondary hyperparathyroidism, not elsewhere classified: Secondary | ICD-10-CM

## 2020-08-18 DIAGNOSIS — E049 Nontoxic goiter, unspecified: Secondary | ICD-10-CM

## 2020-08-18 DIAGNOSIS — E063 Autoimmune thyroiditis: Secondary | ICD-10-CM

## 2020-08-25 DIAGNOSIS — F3174 Bipolar disorder, in full remission, most recent episode manic: Secondary | ICD-10-CM | POA: Diagnosis not present

## 2020-08-27 NOTE — Progress Notes (Signed)
Remote pacemaker transmission.   

## 2020-09-14 NOTE — Progress Notes (Signed)
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    PLAN:  1. Diagnostic: TFTs, PTH, calcium, 25-OH vitamin D, CMP, lithium before her next visit.  2. Therapeutic:  Eat Right, exercise right (walk 30-60 minutes per day or use her exercise bike). Continue the levothyroxine dose of 1.5 of the 112 mcg tablets per day for one day each week, but take only one tablet per day for the other 6 days each week. Take your calcium, vitamin D, vitamin B6, and other medications as prescribed. Continue to take B6, 600 mg daily. Take more B-complex vitamins. 3. Patient education: We discussed all of her problems today as noted above. I again asked her to stop smoking. She smiled and said she was not going to stop. I also asked her to walk every day.   4. Follow-up: 3 months with me. Please schedule FU visits with Dr. Quay Burow and Dr. Dorethea Clan as they desire.  Level of Service: This visit lasted in excess of 60 minutes. More than 50% of the visit was devoted to counseling.   Tillman Sers, MD, CDE Adult and Pediatric Endocrinology 09/15/2020 11:59 AM

## 2020-09-15 ENCOUNTER — Other Ambulatory Visit: Payer: Self-pay

## 2020-09-15 ENCOUNTER — Encounter (INDEPENDENT_AMBULATORY_CARE_PROVIDER_SITE_OTHER): Payer: Self-pay | Admitting: "Endocrinology

## 2020-09-15 ENCOUNTER — Ambulatory Visit (INDEPENDENT_AMBULATORY_CARE_PROVIDER_SITE_OTHER): Payer: Medicare HMO | Admitting: "Endocrinology

## 2020-09-15 VITALS — BP 122/78 | HR 64 | Wt 100.6 lb

## 2020-09-15 DIAGNOSIS — E063 Autoimmune thyroiditis: Secondary | ICD-10-CM

## 2020-09-15 DIAGNOSIS — E049 Nontoxic goiter, unspecified: Secondary | ICD-10-CM

## 2020-09-15 DIAGNOSIS — I10 Essential (primary) hypertension: Secondary | ICD-10-CM

## 2020-09-15 DIAGNOSIS — E1165 Type 2 diabetes mellitus with hyperglycemia: Secondary | ICD-10-CM | POA: Diagnosis not present

## 2020-09-15 DIAGNOSIS — F17200 Nicotine dependence, unspecified, uncomplicated: Secondary | ICD-10-CM

## 2020-09-15 DIAGNOSIS — I482 Chronic atrial fibrillation, unspecified: Secondary | ICD-10-CM | POA: Diagnosis not present

## 2020-09-15 DIAGNOSIS — R251 Tremor, unspecified: Secondary | ICD-10-CM | POA: Diagnosis not present

## 2020-09-15 DIAGNOSIS — H833X3 Noise effects on inner ear, bilateral: Secondary | ICD-10-CM | POA: Diagnosis not present

## 2020-09-15 DIAGNOSIS — F3111 Bipolar disorder, current episode manic without psychotic features, mild: Secondary | ICD-10-CM | POA: Diagnosis not present

## 2020-09-15 DIAGNOSIS — R432 Parageusia: Secondary | ICD-10-CM

## 2020-09-15 DIAGNOSIS — E119 Type 2 diabetes mellitus without complications: Secondary | ICD-10-CM

## 2020-09-15 DIAGNOSIS — R231 Pallor: Secondary | ICD-10-CM

## 2020-09-15 DIAGNOSIS — E782 Mixed hyperlipidemia: Secondary | ICD-10-CM

## 2020-09-15 LAB — POCT GLYCOSYLATED HEMOGLOBIN (HGB A1C): Hemoglobin A1C: 6 % — AB (ref 4.0–5.6)

## 2020-09-15 LAB — POCT GLUCOSE (DEVICE FOR HOME USE): POC Glucose: 172 mg/dl — AB (ref 70–99)

## 2020-09-15 NOTE — Patient Instructions (Signed)
Follow up visit in 3 months. Please have fasting lab tests done 1-2 weeks prior to next visit.

## 2020-09-24 ENCOUNTER — Ambulatory Visit
Admission: RE | Admit: 2020-09-24 | Discharge: 2020-09-24 | Disposition: A | Discharge status: Home / Self Care | Payer: Medicare HMO | Source: Ambulatory Visit | Attending: Emergency Medicine | Admitting: Emergency Medicine

## 2020-09-24 ENCOUNTER — Other Ambulatory Visit: Payer: Self-pay

## 2020-09-24 VITALS — BP 115/78 | HR 60 | Temp 97.8°F | Resp 17

## 2020-09-24 DIAGNOSIS — N3001 Acute cystitis with hematuria: Secondary | ICD-10-CM

## 2020-09-24 LAB — POCT URINALYSIS DIP (MANUAL ENTRY)
Bilirubin, UA: NEGATIVE
Glucose, UA: NEGATIVE mg/dL
Ketones, POC UA: NEGATIVE mg/dL
Nitrite, UA: NEGATIVE
Protein Ur, POC: 100 mg/dL — AB
Spec Grav, UA: 1.02 (ref 1.010–1.025)
Urobilinogen, UA: 0.2 E.U./dL
pH, UA: 6.5 (ref 5.0–8.0)

## 2020-09-24 MED ORDER — SULFAMETHOXAZOLE-TRIMETHOPRIM 800-160 MG PO TABS: 1.0000 | ORAL_TABLET | Freq: Two times a day (BID) | ORAL | 0 refills | Status: AC
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
## 2020-09-24 NOTE — Discharge Instructions (Addendum)
Urine showed evidence of infection. We are treating you with bactrim twice daily for 1 week. Be sure to take full course. Stay hydrated- urine should be pale yellow to clear.   Please return or follow up with your primary provider if symptoms not improving with treatment. Please return sooner if you have worsening of symptoms or develop fever, nausea, vomiting, abdominal pain, back pain, lightheadedness, dizziness.  

## 2020-09-24 NOTE — ED Triage Notes (Signed)
Pt present today with dysuria, urine odor, and urine urgency that started Tuesday.

## 2020-09-26 LAB — URINE CULTURE: Culture: >=100000 — AB

## 2020-09-27 ENCOUNTER — Telehealth (HOSPITAL_COMMUNITY): Payer: Self-pay | Admitting: Emergency Medicine

## 2020-09-27 ENCOUNTER — Encounter (HOSPITAL_COMMUNITY): Payer: Self-pay | Admitting: Emergency Medicine

## 2020-09-27 ENCOUNTER — Other Ambulatory Visit: Payer: Self-pay

## 2020-09-27 ENCOUNTER — Telehealth: Payer: Self-pay | Admitting: Internal Medicine

## 2020-09-27 ENCOUNTER — Emergency Department (HOSPITAL_COMMUNITY): Payer: Medicare HMO

## 2020-09-27 ENCOUNTER — Emergency Department (HOSPITAL_COMMUNITY)
Admission: EM | Admit: 2020-09-27 | Discharge: 2020-09-27 | Disposition: A | Discharge status: Home / Self Care | Payer: Medicare HMO | Attending: Emergency Medicine | Admitting: Emergency Medicine

## 2020-09-27 DIAGNOSIS — E119 Type 2 diabetes mellitus without complications: Secondary | ICD-10-CM | POA: Insufficient documentation

## 2020-09-27 DIAGNOSIS — R509 Fever, unspecified: Secondary | ICD-10-CM | POA: Diagnosis not present

## 2020-09-27 DIAGNOSIS — N390 Urinary tract infection, site not specified: Secondary | ICD-10-CM | POA: Diagnosis not present

## 2020-09-27 DIAGNOSIS — Z7984 Long term (current) use of oral hypoglycemic drugs: Secondary | ICD-10-CM | POA: Diagnosis not present

## 2020-09-27 DIAGNOSIS — F17210 Nicotine dependence, cigarettes, uncomplicated: Secondary | ICD-10-CM | POA: Insufficient documentation

## 2020-09-27 DIAGNOSIS — Z79899 Other long term (current) drug therapy: Secondary | ICD-10-CM | POA: Insufficient documentation

## 2020-09-27 DIAGNOSIS — Z91040 Latex allergy status: Secondary | ICD-10-CM | POA: Insufficient documentation

## 2020-09-27 DIAGNOSIS — Z7901 Long term (current) use of anticoagulants: Secondary | ICD-10-CM | POA: Insufficient documentation

## 2020-09-27 DIAGNOSIS — Z85828 Personal history of other malignant neoplasm of skin: Secondary | ICD-10-CM | POA: Diagnosis not present

## 2020-09-27 DIAGNOSIS — I2510 Atherosclerotic heart disease of native coronary artery without angina pectoris: Secondary | ICD-10-CM | POA: Diagnosis not present

## 2020-09-27 DIAGNOSIS — Z853 Personal history of malignant neoplasm of breast: Secondary | ICD-10-CM | POA: Diagnosis not present

## 2020-09-27 DIAGNOSIS — I10 Essential (primary) hypertension: Secondary | ICD-10-CM | POA: Insufficient documentation

## 2020-09-27 DIAGNOSIS — F0390 Unspecified dementia without behavioral disturbance: Secondary | ICD-10-CM | POA: Insufficient documentation

## 2020-09-27 DIAGNOSIS — R531 Weakness: Secondary | ICD-10-CM | POA: Diagnosis not present

## 2020-09-27 DIAGNOSIS — E039 Hypothyroidism, unspecified: Secondary | ICD-10-CM | POA: Insufficient documentation

## 2020-09-27 DIAGNOSIS — J449 Chronic obstructive pulmonary disease, unspecified: Secondary | ICD-10-CM | POA: Diagnosis not present

## 2020-09-27 LAB — COMPREHENSIVE METABOLIC PANEL
ALT: 23 U/L (ref 0–44)
AST: 29 U/L (ref 15–41)
Albumin: 3.5 g/dL (ref 3.5–5.0)
Alkaline Phosphatase: 62 U/L (ref 38–126)
Anion gap: 11 (ref 5–15)
BUN: 29 mg/dL — ABNORMAL HIGH (ref 8–23)
CO2: 20 mmol/L — ABNORMAL LOW (ref 22–32)
Calcium: 10 mg/dL (ref 8.9–10.3)
Chloride: 105 mmol/L (ref 98–111)
Creatinine, Ser: 1.29 mg/dL — ABNORMAL HIGH (ref 0.44–1.00)
GFR, Estimated: 42 mL/min — ABNORMAL LOW (ref >60)
Glucose, Bld: 149 mg/dL — ABNORMAL HIGH (ref 70–99)
Potassium: 3.8 mmol/L (ref 3.5–5.1)
Sodium: 136 mmol/L (ref 135–145)
Total Bilirubin: 0.5 mg/dL (ref 0.3–1.2)
Total Protein: 6.7 g/dL (ref 6.5–8.1)

## 2020-09-27 LAB — URINALYSIS, ROUTINE W REFLEX MICROSCOPIC
Bilirubin Urine: NEGATIVE
Glucose, UA: NEGATIVE mg/dL
Hgb urine dipstick: NEGATIVE
Ketones, ur: NEGATIVE mg/dL
Nitrite: POSITIVE — AB
Protein, ur: 30 mg/dL — AB
Specific Gravity, Urine: 1.011 (ref 1.005–1.030)
WBC, UA: >50 WBC/hpf — ABNORMAL HIGH (ref 0–5)
pH: 6 (ref 5.0–8.0)

## 2020-09-27 LAB — CBC WITH DIFFERENTIAL/PLATELET
Abs Immature Granulocytes: 0.04 10*3/uL (ref 0.00–0.07)
Basophils Absolute: 0 10*3/uL (ref 0.0–0.1)
Basophils Relative: 0 %
Eosinophils Absolute: 0 10*3/uL (ref 0.0–0.5)
Eosinophils Relative: 0 %
HCT: 43.4 % (ref 36.0–46.0)
Hemoglobin: 14.9 g/dL (ref 12.0–15.0)
Immature Granulocytes: 0 %
Lymphocytes Relative: 2 %
Lymphs Abs: 0.2 10*3/uL — ABNORMAL LOW (ref 0.7–4.0)
MCH: 32.8 pg (ref 26.0–34.0)
MCHC: 34.3 g/dL (ref 30.0–36.0)
MCV: 95.6 fL (ref 80.0–100.0)
Monocytes Absolute: 0.1 10*3/uL (ref 0.1–1.0)
Monocytes Relative: 1 %
Neutro Abs: 9.4 10*3/uL — ABNORMAL HIGH (ref 1.7–7.7)
Neutrophils Relative %: 97 %
Platelets: 143 10*3/uL — ABNORMAL LOW (ref 150–400)
RBC: 4.54 MIL/uL (ref 3.87–5.11)
RDW: 12.2 % (ref 11.5–15.5)
WBC: 9.8 10*3/uL (ref 4.0–10.5)
nRBC: 0 % (ref 0.0–0.2)

## 2020-09-27 LAB — LACTIC ACID, PLASMA: Lactic Acid, Venous: 1.9 mmol/L (ref 0.5–1.9)

## 2020-09-27 MED ORDER — NITROFURANTOIN MONOHYD MACRO 100 MG PO CAPS: 100.0000 mg | ORAL_CAPSULE | Freq: Two times a day (BID) | ORAL | 0 refills | Status: DC

## 2020-09-27 MED ORDER — SODIUM CHLORIDE 0.9 % IV BOLUS
500.0000 mL | Freq: Once | INTRAVENOUS | Status: AC
  Administered 2020-09-27: 500 mL via INTRAVENOUS

## 2020-09-27 MED ORDER — ACETAMINOPHEN 325 MG PO TABS
650.0000 mg | ORAL_TABLET | Freq: Once | ORAL | Status: AC
  Administered 2020-09-27: 650 mg via ORAL
  Filled 2020-09-27: qty 2

## 2020-09-27 MED ORDER — ONDANSETRON 4 MG PO TBDP: 4.0000 mg | ORAL_TABLET | Freq: Three times a day (TID) | ORAL | 0 refills | Status: AC | PRN

## 2020-09-27 MED ORDER — CIPROFLOXACIN IN D5W 400 MG/200ML IV SOLN
400.0000 mg | Freq: Once | INTRAVENOUS | Status: AC
  Administered 2020-09-27: 400 mg via INTRAVENOUS
  Filled 2020-09-27: qty 200

## 2020-09-27 NOTE — Telephone Encounter (Signed)
Patients daughter [name] calling, states the patient was diagnosed with a UTI on Friday at urgent care and she is now shaking and very nauseated and daughter was wondering if patient needed to go to ED. Transferred to [facility] for further evaluation.

## 2020-09-27 NOTE — ED Provider Notes (Signed)
  Face-to-face evaluation   History: She presents for evaluation malaise, and no improvement while taking Septra for UTI.  This morning she had shaking chills.  She is nauseated but has not vomited today.  There has been no cough, shortness of breath, focal weakness or paresthesia.  There are no other known modifying factors.  Physical exam: Elderly alert female who is cooperative but confused.  No dysarthria or aphasia.  No respiratory distress.  Abdomen is soft and nontender to palpation.  Medical screening examination/treatment/procedure(s) were conducted as a shared visit with non-physician practitioner(s) and myself.  I personally evaluated the patient during the encounter    Daleen Bo, MD 09/29/20 [phone]

## 2020-09-27 NOTE — ED Notes (Signed)
Patient tolerating food and drink well at this time

## 2020-09-27 NOTE — ED Notes (Signed)
Pt ambulated to the rr with a walker and minimal assistance. Pt has a steady gate and tolerated activity well.
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
## 2020-09-27 NOTE — ED Notes (Signed)
Patient verbalizes understanding of discharge instructions. Opportunity for questioning and answers were provided. Armband removed by staff, pt discharged from ED.  

## 2020-09-27 NOTE — Discharge Instructions (Addendum)
Your workup was reassuring at this time. Please start taking the oral antibiotic tonight as directed and start taking twice a day starting tomorrow.   We have sent your urine for culture and will call in 2-3 days time IF the antibiotic needs to be changed.   Follow up with your PCP in 1 week for recheck of symptoms/recheck of urine.   Return to the ED IMMEDIATELY for any worsening symptoms including new onset confusion, no improvement in symptoms after 48 hours on proper antibiotic, excessive vomiting despite nausea medication, new abdominal pain or back pain, or any other new/concerning symptoms.

## 2020-09-27 NOTE — ED Triage Notes (Signed)
Pt arrives via [facility] from home with known UTI with worsening symptoms today of fever and chills. She was diagnosed last Tuesday 2/15-but states she was been progressively feeling worse and weak.

## 2020-09-27 NOTE — Telephone Encounter (Signed)
Patients daughter called and said that she called 5 and her mother is being taken to the ED.

## 2020-09-30 LAB — URINE CULTURE: Culture: >=100000 — AB

## 2020-10-01 DIAGNOSIS — F3174 Bipolar disorder, in full remission, most recent episode manic: Secondary | ICD-10-CM | POA: Diagnosis not present

## 2020-10-04 ENCOUNTER — Encounter: Payer: Self-pay | Admitting: Internal Medicine

## 2020-10-04 DIAGNOSIS — N3000 Acute cystitis without hematuria: Secondary | ICD-10-CM | POA: Insufficient documentation

## 2020-10-04 MED ORDER — CIPROFLOXACIN HCL 250 MG PO TABS: 250.0000 mg | ORAL_TABLET | Freq: Two times a day (BID) | ORAL | 0 refills | Status: DC

## 2020-10-04 NOTE — Addendum Note (Signed)
Addended by: Binnie Rail on: 10/04/2020 01:21 PM   Modules accepted: Orders

## 2020-10-04 NOTE — Patient Instructions (Addendum)
Complete the cipro.     If you have any urine symptoms let me know and we will recheck the urine.
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
## 2020-10-05 ENCOUNTER — Other Ambulatory Visit: Payer: Self-pay

## 2020-10-05 ENCOUNTER — Ambulatory Visit (INDEPENDENT_AMBULATORY_CARE_PROVIDER_SITE_OTHER): Payer: Medicare HMO | Admitting: Internal Medicine

## 2020-10-05 ENCOUNTER — Encounter: Payer: Self-pay | Admitting: Internal Medicine

## 2020-10-05 VITALS — BP 116/74 | HR 65 | Temp 98.0°F | Ht 66.0 in | Wt 100.2 lb

## 2020-10-05 DIAGNOSIS — N3001 Acute cystitis with hematuria: Secondary | ICD-10-CM

## 2020-10-05 LAB — POC URINALSYSI DIPSTICK (AUTOMATED)
Bilirubin, UA: NEGATIVE
Blood, UA: NEGATIVE
Glucose, UA: NEGATIVE
Ketones, UA: NEGATIVE
Leukocytes, UA: NEGATIVE
Nitrite, UA: NEGATIVE
Protein, UA: NEGATIVE
Spec Grav, UA: >=1.03 — AB (ref 1.010–1.025)
Urobilinogen, UA: 0.2 E.U./dL
pH, UA: 6 (ref 5.0–8.0)

## 2020-10-05 NOTE — Assessment & Plan Note (Signed)
Acute Had IV cipro x 1 in ED, Completed macrobid - only partially treated - likely did not have a long of enough course Will symptomatic after - yesterday started cipro 250 mg BID x 3 days  No symptoms today Urine dip negative for infection Will send for cx Discussed symptoms and risk for future of uti and prevention

## 2020-10-05 NOTE — Addendum Note (Signed)
Addended by: Marcina Millard on: 10/05/2020 04:06 PM   Modules accepted: Orders

## 2020-10-05 NOTE — Addendum Note (Signed)
Addended by: Jacobo Forest on: 10/05/2020 04:10 PM   Modules accepted: Orders

## 2020-10-07 LAB — CULTURE, URINE COMPREHENSIVE: RESULT:: NO GROWTH

## 2020-10-11 DIAGNOSIS — E119 Type 2 diabetes mellitus without complications: Secondary | ICD-10-CM | POA: Diagnosis not present

## 2020-10-11 DIAGNOSIS — H02235 Paralytic lagophthalmos left lower eyelid: Secondary | ICD-10-CM | POA: Diagnosis not present

## 2020-10-11 DIAGNOSIS — H16223 Keratoconjunctivitis sicca, not specified as Sjogren's, bilateral: Secondary | ICD-10-CM | POA: Diagnosis not present

## 2020-10-11 DIAGNOSIS — H524 Presbyopia: Secondary | ICD-10-CM | POA: Diagnosis not present

## 2020-10-11 DIAGNOSIS — Z961 Presence of intraocular lens: Secondary | ICD-10-CM | POA: Diagnosis not present

## 2020-10-11 DIAGNOSIS — H5213 Myopia, bilateral: Secondary | ICD-10-CM | POA: Diagnosis not present

## 2020-10-11 DIAGNOSIS — H52223 Regular astigmatism, bilateral: Secondary | ICD-10-CM | POA: Diagnosis not present

## 2020-10-13 ENCOUNTER — Other Ambulatory Visit: Payer: Self-pay | Admitting: Internal Medicine

## 2020-10-27 DIAGNOSIS — F3174 Bipolar disorder, in full remission, most recent episode manic: Secondary | ICD-10-CM | POA: Diagnosis not present

## 2020-11-12 ENCOUNTER — Ambulatory Visit (INDEPENDENT_AMBULATORY_CARE_PROVIDER_SITE_OTHER): Payer: Medicare HMO

## 2020-11-12 DIAGNOSIS — I442 Atrioventricular block, complete: Secondary | ICD-10-CM

## 2020-11-15 LAB — CUP PACEART REMOTE DEVICE CHECK
Battery Impedance: 327 Ohm
Battery Remaining Longevity: 121 mo
Battery Voltage: 2.79 V
Brady Statistic AP VP Percent: 6 %
Brady Statistic AP VS Percent: 39 %
Brady Statistic AS VP Percent: 0 %
Brady Statistic AS VS Percent: 55 %
Date Time Interrogation Session: 20220408073750
Implantable Lead Implant Date: 20010521
Implantable Lead Implant Date: 20010521
Implantable Lead Location: 753859
Implantable Lead Location: 753860
Implantable Lead Model: 4592
Implantable Lead Model: 6940
Implantable Pulse Generator Implant Date: 20161229
Lead Channel Impedance Value: 1004 Ohm
Lead Channel Impedance Value: 651 Ohm
Lead Channel Pacing Threshold Amplitude: 0.5 V
Lead Channel Pacing Threshold Amplitude: 1.375 V
Lead Channel Pacing Threshold Pulse Width: 0.4 ms
Lead Channel Pacing Threshold Pulse Width: 0.4 ms
Lead Channel Setting Pacing Amplitude: 2 V
Lead Channel Setting Pacing Amplitude: 3.25 V
Lead Channel Setting Pacing Pulse Width: 0.4 ms
Lead Channel Setting Sensing Sensitivity: 4 mV

## 2020-11-17 ENCOUNTER — Telehealth: Payer: Self-pay | Admitting: Internal Medicine

## 2020-11-17 NOTE — Telephone Encounter (Signed)
Patient ordered a Life Alert necklace. She wanted to know if that would interact with her pacemaker. Please advise

## 2020-11-17 NOTE — Telephone Encounter (Signed)
Patient notified that it is OK to use a Life Alert necklace with the device.

## 2020-11-25 NOTE — Progress Notes (Signed)
Remote pacemaker transmission.   

## 2020-11-29 ENCOUNTER — Encounter: Payer: Self-pay | Admitting: Internal Medicine

## 2020-11-29 MED ORDER — DICLOFENAC SODIUM 1 % EX GEL
4.0000 g | Freq: Four times a day (QID) | CUTANEOUS | 5 refills | Status: AC
Start: 2020-11-29 — End: ?

## 2020-12-14 DIAGNOSIS — E063 Autoimmune thyroiditis: Secondary | ICD-10-CM | POA: Diagnosis not present

## 2020-12-14 DIAGNOSIS — I10 Essential (primary) hypertension: Secondary | ICD-10-CM | POA: Diagnosis not present

## 2020-12-14 DIAGNOSIS — E782 Mixed hyperlipidemia: Secondary | ICD-10-CM | POA: Diagnosis not present

## 2020-12-14 DIAGNOSIS — E119 Type 2 diabetes mellitus without complications: Secondary | ICD-10-CM | POA: Diagnosis not present

## 2020-12-15 ENCOUNTER — Telehealth: Payer: Self-pay | Admitting: "Endocrinology

## 2020-12-15 ENCOUNTER — Telehealth (INDEPENDENT_AMBULATORY_CARE_PROVIDER_SITE_OTHER): Payer: Self-pay | Admitting: "Endocrinology

## 2020-12-15 DIAGNOSIS — M25562 Pain in left knee: Secondary | ICD-10-CM | POA: Diagnosis not present

## 2020-12-15 LAB — LIPID PANEL
Cholesterol: 123 mg/dL (ref <200)
HDL: 74 mg/dL (ref >=50)
LDL Cholesterol (Calc): 30 mg/dL (calc)
Non-HDL Cholesterol (Calc): 49 mg/dL (calc) (ref <130)
Total CHOL/HDL Ratio: 1.7 (calc) (ref <5.0)
Triglycerides: 103 mg/dL (ref <150)

## 2020-12-15 LAB — TSH: TSH: 6.1 mIU/L — ABNORMAL HIGH (ref 0.40–4.50)

## 2020-12-15 LAB — COMPREHENSIVE METABOLIC PANEL
AG Ratio: 1.6 (calc) (ref 1.0–2.5)
ALT: 18 U/L (ref 6–29)
AST: 22 U/L (ref 10–35)
Albumin: 4.1 g/dL (ref 3.6–5.1)
Alkaline phosphatase (APISO): 47 U/L (ref 37–153)
BUN/Creatinine Ratio: 20 (calc) (ref 6–22)
BUN: 21 mg/dL (ref 7–25)
CO2: 30 mmol/L (ref 20–32)
Calcium: 10.3 mg/dL (ref 8.6–10.4)
Chloride: 105 mmol/L (ref 98–110)
Creat: 1.07 mg/dL — ABNORMAL HIGH (ref 0.60–0.88)
Globulin: 2.5 g/dL (calc) (ref 1.9–3.7)
Glucose, Bld: 111 mg/dL — ABNORMAL HIGH (ref 65–99)
Potassium: 4.1 mmol/L (ref 3.5–5.3)
Sodium: 140 mmol/L (ref 135–146)
Total Bilirubin: 0.4 mg/dL (ref 0.2–1.2)
Total Protein: 6.6 g/dL (ref 6.1–8.1)

## 2020-12-15 LAB — C-PEPTIDE: C-Peptide: 1.9 ng/mL (ref 0.80–3.85)

## 2020-12-15 LAB — LITHIUM LEVEL: Lithium Lvl: 0.8 mmol/L (ref 0.6–1.2)

## 2020-12-15 LAB — VITAMIN D 25 HYDROXY (VIT D DEFICIENCY, FRACTURES): Vit D, 25-Hydroxy: 48 ng/mL (ref 30–100)

## 2020-12-15 LAB — T3, FREE: T3, Free: 2.6 pg/mL (ref 2.3–4.2)

## 2020-12-15 LAB — T4, FREE: Free T4: 1.1 ng/dL (ref 0.8–1.8)

## 2020-12-15 LAB — PTH, INTACT AND CALCIUM
Calcium: 10.3 mg/dL (ref 8.6–10.4)
PTH: 30 pg/mL (ref 16–77)

## 2020-12-15 NOTE — Telephone Encounter (Signed)
I called Stacy [name]. I reviewed Stacy [name] recent lab results. Stacy [name] TFTs were low, but all Stacy [name] other results were good. I asked Stacy [name] to increase Stacy [name] doses of Synthroid 112 mcg tablets to 1.5 tablets per day on two days each week, such as Sunday and Wednesday , bit take one pill per day on the other 5 days each week Tillman Sers, MD, CDE

## 2020-12-15 NOTE — Telephone Encounter (Signed)
1. Stacy [name]'s daughter, [name] called to ask me to call hr about her mother's lab test results. 2. I returned her call, but no one was available and there was no abilty to leave a voicemail message. Tillman Sers, MD, CDE

## 2020-12-15 NOTE — Telephone Encounter (Signed)
  Who's calling (name and relationship to patient) :[name] / Daughter   Best contact number:[phone]  Provider they see:Dr. Tobe Sos   Reason for call:Ni[place]le called leaving a VM requesting a call back to discuss questions that she has about lab work that was done and going forth with the medication. Please advise      PRESCRIPTION REFILL ONLY  Name of prescription:  Pharmacy:

## 2020-12-15 NOTE — Telephone Encounter (Signed)
Secure chat message sent to Dr. Brennan: ° °

## 2020-12-23 ENCOUNTER — Encounter (INDEPENDENT_AMBULATORY_CARE_PROVIDER_SITE_OTHER): Payer: Self-pay | Admitting: "Endocrinology

## 2020-12-23 ENCOUNTER — Ambulatory Visit (INDEPENDENT_AMBULATORY_CARE_PROVIDER_SITE_OTHER): Payer: Medicare HMO | Admitting: "Endocrinology

## 2020-12-23 ENCOUNTER — Other Ambulatory Visit: Payer: Self-pay

## 2020-12-23 VITALS — BP 140/92 | HR 48 | Wt 99.0 lb

## 2020-12-23 DIAGNOSIS — I951 Orthostatic hypotension: Secondary | ICD-10-CM

## 2020-12-23 DIAGNOSIS — E063 Autoimmune thyroiditis: Secondary | ICD-10-CM | POA: Diagnosis not present

## 2020-12-23 DIAGNOSIS — R634 Abnormal weight loss: Secondary | ICD-10-CM

## 2020-12-23 DIAGNOSIS — F0390 Unspecified dementia without behavioral disturbance: Secondary | ICD-10-CM | POA: Diagnosis not present

## 2020-12-23 DIAGNOSIS — E7800 Pure hypercholesterolemia, unspecified: Secondary | ICD-10-CM

## 2020-12-23 DIAGNOSIS — F03A0 Unspecified dementia, mild, without behavioral disturbance, psychotic disturbance, mood disturbance, and anxiety: Secondary | ICD-10-CM

## 2020-12-23 DIAGNOSIS — R5383 Other fatigue: Secondary | ICD-10-CM | POA: Diagnosis not present

## 2020-12-23 DIAGNOSIS — E210 Primary hyperparathyroidism: Secondary | ICD-10-CM

## 2020-12-23 DIAGNOSIS — F3177 Bipolar disorder, in partial remission, most recent episode mixed: Secondary | ICD-10-CM

## 2020-12-23 DIAGNOSIS — E11649 Type 2 diabetes mellitus with hypoglycemia without coma: Secondary | ICD-10-CM

## 2020-12-23 DIAGNOSIS — I10 Essential (primary) hypertension: Secondary | ICD-10-CM | POA: Diagnosis not present

## 2020-12-23 DIAGNOSIS — R251 Tremor, unspecified: Secondary | ICD-10-CM

## 2020-12-23 DIAGNOSIS — R432 Parageusia: Secondary | ICD-10-CM

## 2020-12-23 DIAGNOSIS — K141 Geographic tongue: Secondary | ICD-10-CM

## 2020-12-23 DIAGNOSIS — E119 Type 2 diabetes mellitus without complications: Secondary | ICD-10-CM

## 2020-12-23 DIAGNOSIS — H8110 Benign paroxysmal vertigo, unspecified ear: Secondary | ICD-10-CM

## 2020-12-23 LAB — POCT GLYCOSYLATED HEMOGLOBIN (HGB A1C): Hemoglobin A1C: 5.5 % (ref 4.0–5.6)

## 2020-12-23 LAB — POCT GLUCOSE (DEVICE FOR HOME USE): POC Glucose: 143 mg/dl — AB (ref 70–99)
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
## 2020-12-23 NOTE — Patient Instructions (Signed)
Follow up visit in 3 months. 

## 2021-01-05 DIAGNOSIS — F3174 Bipolar disorder, in full remission, most recent episode manic: Secondary | ICD-10-CM | POA: Diagnosis not present

## 2021-01-12 ENCOUNTER — Encounter: Payer: Self-pay | Admitting: Radiation Oncology

## 2021-01-14 ENCOUNTER — Other Ambulatory Visit: Payer: Self-pay | Admitting: Internal Medicine

## 2021-01-14 ENCOUNTER — Other Ambulatory Visit (INDEPENDENT_AMBULATORY_CARE_PROVIDER_SITE_OTHER): Payer: Self-pay | Admitting: "Endocrinology

## 2021-01-14 DIAGNOSIS — E049 Nontoxic goiter, unspecified: Secondary | ICD-10-CM

## 2021-01-14 DIAGNOSIS — E063 Autoimmune thyroiditis: Secondary | ICD-10-CM

## 2021-01-14 DIAGNOSIS — E211 Secondary hyperparathyroidism, not elsewhere classified: Secondary | ICD-10-CM

## 2021-01-14 DIAGNOSIS — E210 Primary hyperparathyroidism: Secondary | ICD-10-CM

## 2021-01-18 NOTE — Progress Notes (Signed)
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 Right breast / 2.67 Gy x 15 fractions for a total dose of 40.05 Gy 2. Boost / 2 Gy x 5 fractions, for a total dose of 10 Gy  Narrative:  The patient returns today for routine 6 month follow-up (the patient was last seen here for follow-up on 07/22/20).   Since her last visit, she has had multiple ED visits in regards to a UTI. On 09/24/20, she presented with acute cystitis and hematuria to [facility]. She was sent home with Bactrim and then Macrobid once urine culture confirmed UTI. She soon after presented to the [facility] ED on 09/27/20 with worsening UTI symptoms. The patients daughter reported that the patient seemed to be weak and shaky as well as nauseated. She was treated with tylenol and IV cipro and was then soon after discharged home with zofran.   The patient received a chest CT while in the [facility] ED that showed no abnormal findings.         Upon office visit with Dr. Quay Burow on 10/05/20, it was noted that the patient likely did not receive a long enough course of antibiotics and was given cipro 250 mg BID x 3 days.      The patient otherwise has had no recent imaging, labs, or visits relevant to her breast cancer since she was last seen.  She did undergo a bilateral diagnostic mammogram in December of last year.  This showed no concerning findings in either breast.  Allergies:  is allergic to penicillins, latex, adhesive [tape], codeine, metronidazole, and other.  Meds: Current Outpatient Medications   Medication Sig Dispense Refill   ACCU-CHEK AVIVA PLUS test strip TEST BLOOD SUGAR TWICE DAILY 200 strip 1   Alcohol Swabs (B-D SINGLE USE SWABS REGULAR) PADS USE TWICE DAILY 200 each 2   atorvastatin (LIPITOR) 80 MG tablet TAKE 1 TABLET EVERY DAY ON MONDAY, WEDNESDAY, FRIDAY  AND  SUNDAY  AT  6PM (Patient taking differently: TAKE 1 TABLET EVERY DAY ON MONDAY, WEDNESDAY, FRIDAY  AND  SUNDAY  AT  6PM) 48 tablet 3   Biotin 10000 MCG TABS Take 10,000 mcg by mouth at bedtime.     busPIRone (BUSPAR) 15 MG tablet Take 15 mg by mouth in the morning.     Calcium Carb-Cholecalciferol (CALCIUM+D3 PO) Take 1 tablet by mouth at bedtime.     dabigatran (PRADAXA) 150 MG CAPS capsule Take 1 capsule (150 mg total) by mouth 2 (two) times daily. 180 capsule 3   diclofenac Sodium (VOLTAREN) 1 % GEL Apply 4 g topically 4 (four) times daily. 100 g 5   donepezil (ARICEPT) 10 MG tablet TAKE 1 TABLET AT BEDTIME 90 tablet 1   ENSURE (ENSURE) Take 237 mLs by mouth 2 (two) times daily between meals. 42660 mL 3   FLUoxetine (PROZAC) 20 MG capsule Take 20 mg by mouth in the morning.     hydrocortisone (ANUSOL-HC) 2.5 % rectal cream USE 1 APPLICATION RECTALLY TWICE DAILY 30 g 0  ketotifen (ZADITOR) 0.025 % ophthalmic solution Place 1 drop into both eyes as needed (for allergies).     lithium carbonate (LITHOBID) 300 MG CR tablet Take 300 mg by mouth at bedtime.     loratadine (CLARITIN) 10 MG tablet Take 10 mg by mouth in the morning.     Melatonin 5 MG TABS Take 2.5 mg by mouth at bedtime.     metFORMIN (GLUCOPHAGE-XR) 500 MG 24 hr tablet TAKE 1 TABLET EVERY DAY WITH BREAKFAST 90 tablet 3   Multiple Vitamin (MULTIVITAMIN WITH MINERALS) TABS tablet Take 1 tablet by mouth daily.      non-metallic deodorant (ALRA) MISC Apply 1 application topically daily as needed (for dryness).     ondansetron (ZOFRAN ODT) 4 MG disintegrating tablet Take 1 tablet (4 mg total) by mouth every 8 (eight) hours as needed for nausea or vomiting.  20 tablet 0   oxybutynin (DITROPAN) 5 MG tablet TAKE 1 TABLET TWICE DAILY 180 tablet 1   Polyethyl Glycol-Propyl Glycol 0.4-0.3 % SOLN Place 1-2 drops into both eyes 3 (three) times daily as needed (for dry/irritated eyes.).     PRODIGY TWIST TOP LANCETS 28G MISC CHECK BLOOD SUGAR EVERY DAY 100 each 1   pyridoxine (B-6) 500 MG tablet Take 500 mg by mouth in the morning.     pyridOXINE (VITAMIN B-6) 100 MG tablet Take 100 mg by mouth at bedtime.     SYNTHROID 112 MCG tablet TAKE 1 TABLET 5 DAYS A WEEK AND 1 AND 1/2 TABLETS 2 DAYS A WEEK 96 tablet 1   SYSTANE OVERNIGHT THERAPY 0.3 % GEL ophthalmic ointment Place 1 application into both eyes at bedtime.     Thiamine HCl (VITAMIN B-1) 250 MG tablet Take 250 mg by mouth at bedtime.     acetaminophen (TYLENOL) 500 MG tablet Take 500 mg by mouth every 6 (six) hours as needed (for pain.).     ciprofloxacin (CIPRO) 250 MG tablet Take 1 tablet (250 mg total) by mouth 2 (two) times daily. (Patient not taking: Reported on 01/20/2021) 6 tablet 0   vitamin B-12 (CYANOCOBALAMIN) 1000 MCG tablet Take 1,000-3,000 mcg by mouth in the morning.     No current facility-administered medications for this encounter.    Physical Findings: The patient is in no acute distress. Patient is alert and oriented.  height is 5' 4.5" (1.638 m) and weight is 100 lb 6.4 oz (45.5 kg). Her temperature is 97.7 F (36.5 C). Her blood pressure is 143/68 (abnormal) and her pulse is 60. Her respiration is 20 and oxygen saturation is 97%. .  No significant changes. Lungs are clear to auscultation bilaterally. Heart has regular rate and rhythm. No palpable cervical, supraclavicular, or axillary adenopathy. Abdomen soft, non-tender, normal bowel sounds.  Examination of the left breast reveals no palpable mass nipple discharge or bleeding.  Examination right breast reveals lumpectomy scar in the lower outer quadrant. .  No palpable mass nipple discharge or bleeding.  Pacemaker present in the  left upper chest region.    Lab Findings: Lab Results  Component Value Date   WBC 9.8 09/27/2020   HGB 14.9 09/27/2020   HCT 43.4 09/27/2020   MCV 95.6 09/27/2020   PLT 143 (L) 09/27/2020    Radiographic Findings: No results found.  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  Mammogram in December of last year showed no concerning findings.  Plan: Patient will return for routine follow-up in December.  She will have a mammogram prior to this follow-up appointment.   20 minutes of total time was spent for this patient encounter, including preparation, face-to-face counseling with the patient and coordination of care, physical exam, and documentation of the encounter. ____________________________________  Blair Promise, PhD, MD   This document serves as a record of services personally performed by Gery Pray, MD. It was created on his behalf by Roney Mans, a trained medical scribe. The creation of this record is based on the scribe's personal observations and the provider's statements to them. This document has been checked and approved by the attending provider.

## 2021-01-20 ENCOUNTER — Ambulatory Visit
Admission: RE | Admit: 2021-01-20 | Discharge: 2021-01-20 | Disposition: A | Discharge status: Home / Self Care | Payer: Medicare HMO | Source: Ambulatory Visit | Attending: Radiation Oncology | Admitting: Radiation Oncology

## 2021-01-20 ENCOUNTER — Other Ambulatory Visit: Payer: Self-pay

## 2021-01-20 ENCOUNTER — Encounter: Payer: Self-pay | Admitting: Radiation Oncology

## 2021-01-20 VITALS — BP 143/68 | HR 60 | Temp 97.7°F | Resp 20 | Ht 64.5 in | Wt 100.4 lb

## 2021-01-20 DIAGNOSIS — Z08 Encounter for follow-up examination after completed treatment for malignant neoplasm: Secondary | ICD-10-CM | POA: Diagnosis not present

## 2021-01-20 DIAGNOSIS — Z79899 Other long term (current) drug therapy: Secondary | ICD-10-CM | POA: Diagnosis not present

## 2021-01-20 DIAGNOSIS — Z923 Personal history of irradiation: Secondary | ICD-10-CM | POA: Insufficient documentation

## 2021-01-20 DIAGNOSIS — Z7984 Long term (current) use of oral hypoglycemic drugs: Secondary | ICD-10-CM | POA: Diagnosis not present

## 2021-01-20 DIAGNOSIS — Z791 Long term (current) use of non-steroidal anti-inflammatories (NSAID): Secondary | ICD-10-CM | POA: Diagnosis not present

## 2021-01-20 DIAGNOSIS — Z86000 Personal history of in-situ neoplasm of breast: Secondary | ICD-10-CM | POA: Diagnosis not present

## 2021-01-20 DIAGNOSIS — D0511 Intraductal carcinoma in situ of right breast: Secondary | ICD-10-CM

## 2021-01-20 HISTORY — DX: Personal history of irradiation: Z92.3

## 2021-01-20 NOTE — Progress Notes (Signed)
Stacy [name] is here today for follow up post radiation to the breast.   Breast Side:Right    They completed their radiation on: 11/20/2017  Does the patient complain of any of the following: Post radiation skin issues: Patient reports having some peeling to right breast. Breast Tenderness: no Breast Swelling: no Lymphadema: no Range of Motion limitations: no Fatigue post radiation: Patient reports having moderate fatigue.  Appetite good/fair/poor: Fair, patient drinking premier protein   Additional comments if applicable:  Vitals:   64/84/72 0815  BP: (!) 143/68  Pulse: 60  Resp: 20  Temp: 97.7 F (36.5 C)  SpO2: 97%  Weight: 100 lb 6.4 oz (45.5 kg)  Height: 5' 4.5" (1.638 m)

## 2021-01-24 NOTE — Patient Instructions (Addendum)
   Medications changes include :   none    Please followup in 6 months   
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
## 2021-01-25 ENCOUNTER — Ambulatory Visit (INDEPENDENT_AMBULATORY_CARE_PROVIDER_SITE_OTHER): Payer: Medicare HMO | Admitting: Internal Medicine

## 2021-01-25 ENCOUNTER — Encounter: Payer: Self-pay | Admitting: Internal Medicine

## 2021-01-25 ENCOUNTER — Other Ambulatory Visit: Payer: Self-pay

## 2021-01-25 ENCOUNTER — Encounter (INDEPENDENT_AMBULATORY_CARE_PROVIDER_SITE_OTHER): Payer: Self-pay

## 2021-01-25 VITALS — BP 136/78 | HR 60 | Temp 98.1°F | Ht 64.0 in | Wt 101.0 lb

## 2021-01-25 DIAGNOSIS — F03A0 Unspecified dementia, mild, without behavioral disturbance, psychotic disturbance, mood disturbance, and anxiety: Secondary | ICD-10-CM

## 2021-01-25 DIAGNOSIS — F0390 Unspecified dementia without behavioral disturbance: Secondary | ICD-10-CM

## 2021-01-25 DIAGNOSIS — E063 Autoimmune thyroiditis: Secondary | ICD-10-CM | POA: Diagnosis not present

## 2021-01-25 DIAGNOSIS — E782 Mixed hyperlipidemia: Secondary | ICD-10-CM

## 2021-01-25 DIAGNOSIS — N3281 Overactive bladder: Secondary | ICD-10-CM

## 2021-01-25 DIAGNOSIS — E1169 Type 2 diabetes mellitus with other specified complication: Secondary | ICD-10-CM

## 2021-01-25 MED ORDER — HYDROCORTISONE (PERIANAL) 2.5 % EX CREA: TOPICAL_CREAM | CUTANEOUS | 11 refills | Status: AC

## 2021-01-25 MED ORDER — OXYBUTYNIN CHLORIDE 5 MG PO TABS: 5.0000 mg | ORAL_TABLET | Freq: Every day | ORAL | 1 refills | Status: DC

## 2021-01-25 NOTE — Assessment & Plan Note (Signed)
Chronic Controlled, stable Continue ditropan 5 mg daily

## 2021-01-25 NOTE — Assessment & Plan Note (Signed)
Chronic Check lipid panel  Continue atorvastatin 80 mg daily Regular exercise and healthy diet encouraged  

## 2021-01-25 NOTE — Assessment & Plan Note (Signed)
Chronic Management per endocrine 

## 2021-01-25 NOTE — Assessment & Plan Note (Signed)
Chronic Well controlled Management per endocrine

## 2021-01-25 NOTE — Assessment & Plan Note (Signed)
Chronic Continue aricept 10 mg nightly

## 2021-02-11 ENCOUNTER — Ambulatory Visit (INDEPENDENT_AMBULATORY_CARE_PROVIDER_SITE_OTHER): Payer: Medicare HMO

## 2021-02-11 DIAGNOSIS — I442 Atrioventricular block, complete: Secondary | ICD-10-CM | POA: Diagnosis not present

## 2021-02-13 LAB — CUP PACEART REMOTE DEVICE CHECK
Battery Impedance: 327 Ohm
Battery Remaining Longevity: 123 mo
Battery Voltage: 2.79 V
Brady Statistic AP VP Percent: 6 %
Brady Statistic AP VS Percent: 37 %
Brady Statistic AS VP Percent: 0 %
Brady Statistic AS VS Percent: 57 %
Date Time Interrogation Session: 20220708115747
Implantable Lead Implant Date: 20010521
Implantable Lead Implant Date: 20010521
Implantable Lead Location: 753859
Implantable Lead Location: 753860
Implantable Lead Model: 4592
Implantable Lead Model: 6940
Implantable Pulse Generator Implant Date: 20161229
Lead Channel Impedance Value: 1121 Ohm
Lead Channel Impedance Value: 651 Ohm
Lead Channel Pacing Threshold Amplitude: 0.5 V
Lead Channel Pacing Threshold Amplitude: 1.375 V
Lead Channel Pacing Threshold Pulse Width: 0.4 ms
Lead Channel Pacing Threshold Pulse Width: 0.4 ms
Lead Channel Setting Pacing Amplitude: 2 V
Lead Channel Setting Pacing Amplitude: 2.75 V
Lead Channel Setting Pacing Pulse Width: 0.4 ms
Lead Channel Setting Sensing Sensitivity: 4 mV

## 2021-02-22 DIAGNOSIS — E063 Autoimmune thyroiditis: Secondary | ICD-10-CM | POA: Diagnosis not present

## 2021-02-22 LAB — T3, FREE: T3, Free: 2.6 pg/mL (ref 2.3–4.2)

## 2021-02-22 LAB — T4, FREE: Free T4: 1.3 ng/dL (ref 0.8–1.8)

## 2021-02-22 LAB — TSH: TSH: 4.49 mIU/L (ref 0.40–4.50)

## 2021-02-28 ENCOUNTER — Other Ambulatory Visit: Payer: Self-pay | Admitting: Internal Medicine

## 2021-03-01 ENCOUNTER — Other Ambulatory Visit: Payer: Self-pay

## 2021-03-01 ENCOUNTER — Encounter (INDEPENDENT_AMBULATORY_CARE_PROVIDER_SITE_OTHER): Payer: Self-pay | Admitting: *Deleted

## 2021-03-01 ENCOUNTER — Ambulatory Visit
Admission: RE | Admit: 2021-03-01 | Discharge: 2021-03-01 | Disposition: A | Discharge status: Home / Self Care | Payer: Medicare HMO | Source: Ambulatory Visit | Attending: Student | Admitting: Student

## 2021-03-01 VITALS — BP 155/84 | HR 63 | Temp 97.9°F | Resp 18

## 2021-03-01 DIAGNOSIS — F17210 Nicotine dependence, cigarettes, uncomplicated: Secondary | ICD-10-CM

## 2021-03-01 DIAGNOSIS — J069 Acute upper respiratory infection, unspecified: Secondary | ICD-10-CM | POA: Diagnosis not present

## 2021-03-01 DIAGNOSIS — Z20822 Contact with and (suspected) exposure to covid-19: Secondary | ICD-10-CM | POA: Diagnosis not present

## 2021-03-01 MED ORDER — PREDNISONE 20 MG PO TABS: 20.0000 mg | ORAL_TABLET | Freq: Every day | ORAL | 0 refills | Status: AC
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
## 2021-03-01 NOTE — Discharge Instructions (Addendum)
-  Prednisone, one pill taken with breakfast or lunch x5 days. This will help with cough. I recommend taking this in the morning as it could give you energy.   -Avoid NSAIDs like ibuprofen and alleve, but you can take tylenol (acetaminophen) up to '1000mg'$  3x daily. -Drink plenty of fluids and get plenty of rest -Seek additional medical attention if new symptoms like shortness of breath, weakness, dizziness, chest pain, severe headaches, vision changes.

## 2021-03-01 NOTE — ED Triage Notes (Signed)
Pt here for cough and congestion x 3 days; pt had neg home covid test

## 2021-03-02 LAB — SARS-COV-2, NAA 2 DAY TAT

## 2021-03-02 LAB — NOVEL CORONAVIRUS, NAA: SARS-CoV-2, NAA: NOT DETECTED

## 2021-03-04 NOTE — Progress Notes (Signed)
Remote pacemaker transmission.   

## 2021-03-15 ENCOUNTER — Telehealth: Payer: Self-pay | Admitting: Internal Medicine

## 2021-03-15 NOTE — Telephone Encounter (Signed)
ERROR

## 2021-03-17 ENCOUNTER — Ambulatory Visit (INDEPENDENT_AMBULATORY_CARE_PROVIDER_SITE_OTHER): Payer: Medicare HMO

## 2021-03-17 DIAGNOSIS — Z0000 Encounter for general adult medical examination without abnormal findings: Secondary | ICD-10-CM | POA: Diagnosis not present
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
## 2021-03-17 NOTE — Patient Instructions (Signed)
Stacy [name] , Thank you for taking time to come for your Medicare Wellness Visit. I appreciate your ongoing commitment to your health goals. Please review the following plan we discussed and let me know if I can assist you in the future.   Screening recommendations/referrals: Colonoscopy: not a candidate due to age Mammogram: 07/14/2020; due every year Bone Density: 12/07/2016; due every 5 years Recommended yearly ophthalmology/optometry visit for glaucoma screening and checkup Recommended yearly dental visit for hygiene and checkup  Vaccinations: Influenza vaccine: 05/12/2020; due Fall 2022 Pneumococcal vaccine: 09/08/2011, 06/09/2016, 12/30/2020 Tdap vaccine: 07/27/2017; due every 10 years Shingles vaccine: 11/26/2019, 02/14/2021   Covid-19:08/18/2019, 09/17/2019, 06/01/2020, 12/27/2020  Advanced directives: Yes; documents on file  Conditions/risks identified: Yes; Client understands the importance of follow-up with providers by attending scheduled visits and discussed goals to eat healthier, increase physical activity, exercise the brain, socialize more, get enough sleep and make time for laughter.  Next appointment: Please schedule your next Medicare Wellness Visit with your Nurse Health Advisor in 1 year by calling [phone].   Preventive Care 85 Years and Older, Female Preventive care refers to lifestyle choices and visits with your health care provider that can promote health and wellness. What does preventive care include? A yearly physical exam. This is also called an annual well check. Dental exams once or twice a year. Routine eye exams. Ask your health care provider how often you should have your eyes checked. Personal lifestyle choices, including: Daily care of your teeth and gums. Regular physical activity. Eating a healthy diet. Avoiding tobacco and drug use. Limiting alcohol use. Practicing safe sex. Taking low-dose aspirin every day. Taking vitamin and mineral  supplements as recommended by your health care provider. What happens during an annual well check? The services and screenings done by your health care provider during your annual well check will depend on your age, overall health, lifestyle risk factors, and family history of disease. Counseling  Your health care provider may ask you questions about your: Alcohol use. Tobacco use. Drug use. Emotional well-being. Home and relationship well-being. Sexual activity. Eating habits. History of falls. Memory and ability to understand (cognition). Work and work Statistician. Reproductive health. Screening  You may have the following tests or measurements: Height, weight, and BMI. Blood pressure. Lipid and cholesterol levels. These may be checked every 5 years, or more frequently if you are over 46 years old. Skin check. Lung cancer screening. You may have this screening every year starting at age 79 if you have a 30-pack-year history of smoking and currently smoke or have quit within the past 15 years. Fecal occult blood test (FOBT) of the stool. You may have this test every year starting at age 43. Flexible sigmoidoscopy or colonoscopy. You may have a sigmoidoscopy every 5 years or a colonoscopy every 10 years starting at age 71. Hepatitis C blood test. Hepatitis B blood test. Sexually transmitted disease (STD) testing. Diabetes screening. This is done by checking your blood sugar (glucose) after you have not eaten for a while (fasting). You may have this done every 1-3 years. Bone density scan. This is done to screen for osteoporosis. You may have this done starting at age 49. Mammogram. This may be done every 1-2 years. Talk to your health care provider about how often you should have regular mammograms. Talk with your health care provider about your test results, treatment options, and if necessary, the need for more tests. Vaccines  Your health care provider may recommend certain  vaccines,  such as: Influenza vaccine. This is recommended every year. Tetanus, diphtheria, and acellular pertussis (Tdap, Td) vaccine. You may need a Td booster every 10 years. Zoster vaccine. You may need this after age 15. Pneumococcal 13-valent conjugate (PCV13) vaccine. One dose is recommended after age 8. Pneumococcal polysaccharide (PPSV23) vaccine. One dose is recommended after age 77. Talk to your health care provider about which screenings and vaccines you need and how often you need them. This information is not intended to replace advice given to you by your health care provider. Make sure you discuss any questions you have with your health care provider. Document Released: 08/20/2015 Document Revised: 04/12/2016 Document Reviewed: 05/25/2015 Elsevier Interactive Patient Education  2017 [place] Prevention in the Home Falls can cause injuries. They can happen to people of all ages. There are many things you can do to make your home safe and to help prevent falls. What can I do on the outside of my home? Regularly fix the edges of walkways and driveways and fix any cracks. Remove anything that might make you trip as you walk through a door, such as a raised step or threshold. Trim any bushes or trees on the path to your home. Use bright outdoor lighting. Clear any walking paths of anything that might make someone trip, such as rocks or tools. Regularly check to see if handrails are loose or broken. Make sure that both sides of any steps have handrails. Any raised decks and porches should have guardrails on the edges. Have any leaves, snow, or ice cleared regularly. Use sand or salt on walking paths during winter. Clean up any spills in your garage right away. This includes oil or grease spills. What can I do in the bathroom? Use night lights. Install grab bars by the toilet and in the tub and shower. Do not use towel bars as grab bars. Use non-skid mats or decals in  the tub or shower. If you need to sit down in the shower, use a plastic, non-slip stool. Keep the floor dry. Clean up any water that spills on the floor as soon as it happens. Remove soap buildup in the tub or shower regularly. Attach bath mats securely with double-sided non-slip rug tape. Do not have throw rugs and other things on the floor that can make you trip. What can I do in the bedroom? Use night lights. Make sure that you have a light by your bed that is easy to reach. Do not use any sheets or blankets that are too big for your bed. They should not hang down onto the floor. Have a firm chair that has side arms. You can use this for support while you get dressed. Do not have throw rugs and other things on the floor that can make you trip. What can I do in the kitchen? Clean up any spills right away. Avoid walking on wet floors. Keep items that you use a lot in easy-to-reach places. If you need to reach something above you, use a strong step stool that has a grab bar. Keep electrical cords out of the way. Do not use floor polish or wax that makes floors slippery. If you must use wax, use non-skid floor wax. Do not have throw rugs and other things on the floor that can make you trip. What can I do with my stairs? Do not leave any items on the stairs. Make sure that there are handrails on both sides of the stairs and use  them. Fix handrails that are broken or loose. Make sure that handrails are as long as the stairways. Check any carpeting to make sure that it is firmly attached to the stairs. Fix any carpet that is loose or worn. Avoid having throw rugs at the top or bottom of the stairs. If you do have throw rugs, attach them to the floor with carpet tape. Make sure that you have a light switch at the top of the stairs and the bottom of the stairs. If you do not have them, ask someone to add them for you. What else can I do to help prevent falls? Wear shoes that: Do not have high  heels. Have rubber bottoms. Are comfortable and fit you well. Are closed at the toe. Do not wear sandals. If you use a stepladder: Make sure that it is fully opened. Do not climb a closed stepladder. Make sure that both sides of the stepladder are locked into place. Ask someone to hold it for you, if possible. Clearly mark and make sure that you can see: Any grab bars or handrails. First and last steps. Where the edge of each step is. Use tools that help you move around (mobility aids) if they are needed. These include: Canes. Walkers. Scooters. Crutches. Turn on the lights when you go into a dark area. Replace any light bulbs as soon as they burn out. Set up your furniture so you have a clear path. Avoid moving your furniture around. If any of your floors are uneven, fix them. If there are any pets around you, be aware of where they are. Review your medicines with your doctor. Some medicines can make you feel dizzy. This can increase your chance of falling. Ask your doctor what other things that you can do to help prevent falls. This information is not intended to replace advice given to you by your health care provider. Make sure you discuss any questions you have with your health care provider. Document Released: 05/20/2009 Document Revised: 12/30/2015 Document Reviewed: 08/28/2014 Elsevier Interactive Patient Education  2017 [employer].

## 2021-03-31 ENCOUNTER — Encounter (INDEPENDENT_AMBULATORY_CARE_PROVIDER_SITE_OTHER): Payer: Self-pay | Admitting: "Endocrinology

## 2021-03-31 ENCOUNTER — Ambulatory Visit (INDEPENDENT_AMBULATORY_CARE_PROVIDER_SITE_OTHER): Payer: Medicare HMO | Admitting: "Endocrinology

## 2021-03-31 ENCOUNTER — Other Ambulatory Visit: Payer: Self-pay

## 2021-03-31 VITALS — BP 142/82 | HR 74 | Wt 101.4 lb

## 2021-03-31 DIAGNOSIS — E049 Nontoxic goiter, unspecified: Secondary | ICD-10-CM | POA: Diagnosis not present

## 2021-03-31 DIAGNOSIS — R251 Tremor, unspecified: Secondary | ICD-10-CM | POA: Diagnosis not present

## 2021-03-31 DIAGNOSIS — F3174 Bipolar disorder, in full remission, most recent episode manic: Secondary | ICD-10-CM | POA: Diagnosis not present

## 2021-03-31 DIAGNOSIS — E782 Mixed hyperlipidemia: Secondary | ICD-10-CM

## 2021-03-31 DIAGNOSIS — R634 Abnormal weight loss: Secondary | ICD-10-CM

## 2021-03-31 DIAGNOSIS — I10 Essential (primary) hypertension: Secondary | ICD-10-CM

## 2021-03-31 DIAGNOSIS — K141 Geographic tongue: Secondary | ICD-10-CM

## 2021-03-31 DIAGNOSIS — E11649 Type 2 diabetes mellitus with hypoglycemia without coma: Secondary | ICD-10-CM | POA: Diagnosis not present

## 2021-03-31 DIAGNOSIS — E063 Autoimmune thyroiditis: Secondary | ICD-10-CM | POA: Diagnosis not present

## 2021-03-31 DIAGNOSIS — R413 Other amnesia: Secondary | ICD-10-CM | POA: Diagnosis not present

## 2021-03-31 DIAGNOSIS — E559 Vitamin D deficiency, unspecified: Secondary | ICD-10-CM

## 2021-03-31 DIAGNOSIS — E119 Type 2 diabetes mellitus without complications: Secondary | ICD-10-CM

## 2021-03-31 DIAGNOSIS — E210 Primary hyperparathyroidism: Secondary | ICD-10-CM

## 2021-03-31 LAB — POCT GLYCOSYLATED HEMOGLOBIN (HGB A1C): Hemoglobin A1C: 6.2 % — AB (ref 4.0–5.6)

## 2021-03-31 LAB — POCT GLUCOSE (DEVICE FOR HOME USE): POC Glucose: 192 mg/dl — AB (ref 70–99)

## 2021-03-31 NOTE — Patient Instructions (Signed)
Follow up visit in 3 months. Please obtain lab tests 1-2 weeks prior.
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
## 2021-04-14 DIAGNOSIS — L814 Other melanin hyperpigmentation: Secondary | ICD-10-CM | POA: Diagnosis not present

## 2021-04-14 DIAGNOSIS — F3174 Bipolar disorder, in full remission, most recent episode manic: Secondary | ICD-10-CM | POA: Diagnosis not present

## 2021-04-14 DIAGNOSIS — D225 Melanocytic nevi of trunk: Secondary | ICD-10-CM | POA: Diagnosis not present

## 2021-04-14 DIAGNOSIS — D485 Neoplasm of uncertain behavior of skin: Secondary | ICD-10-CM | POA: Diagnosis not present

## 2021-04-14 DIAGNOSIS — L821 Other seborrheic keratosis: Secondary | ICD-10-CM | POA: Diagnosis not present

## 2021-04-14 DIAGNOSIS — L570 Actinic keratosis: Secondary | ICD-10-CM | POA: Diagnosis not present

## 2021-04-14 DIAGNOSIS — D224 Melanocytic nevi of scalp and neck: Secondary | ICD-10-CM | POA: Diagnosis not present

## 2021-04-14 DIAGNOSIS — Z85828 Personal history of other malignant neoplasm of skin: Secondary | ICD-10-CM | POA: Diagnosis not present

## 2021-04-14 DIAGNOSIS — D1801 Hemangioma of skin and subcutaneous tissue: Secondary | ICD-10-CM | POA: Diagnosis not present

## 2021-04-27 ENCOUNTER — Other Ambulatory Visit: Payer: Self-pay | Admitting: Internal Medicine

## 2021-05-04 ENCOUNTER — Encounter: Payer: Self-pay | Admitting: Internal Medicine

## 2021-05-12 NOTE — Telephone Encounter (Signed)
Renee stopped by to make a copy of older [facility] paperwork to make sure that the same things are on this years paperwork as last years.  Patient highlighted older forms and they were placed in providers box.

## 2021-05-13 ENCOUNTER — Ambulatory Visit (INDEPENDENT_AMBULATORY_CARE_PROVIDER_SITE_OTHER): Payer: Medicare HMO

## 2021-05-13 DIAGNOSIS — I442 Atrioventricular block, complete: Secondary | ICD-10-CM | POA: Diagnosis not present

## 2021-05-16 LAB — CUP PACEART REMOTE DEVICE CHECK
Battery Impedance: 351 Ohm
Battery Remaining Longevity: 118 mo
Battery Voltage: 2.79 V
Brady Statistic AP VP Percent: 7 %
Brady Statistic AP VS Percent: 36 %
Brady Statistic AS VP Percent: 0 %
Brady Statistic AS VS Percent: 57 %
Date Time Interrogation Session: 20221007095902
Implantable Lead Implant Date: 20010521
Implantable Lead Implant Date: 20010521
Implantable Lead Location: 753859
Implantable Lead Location: 753860
Implantable Lead Model: 4592
Implantable Lead Model: 6940
Implantable Pulse Generator Implant Date: 20161229
Lead Channel Impedance Value: 1056 Ohm
Lead Channel Impedance Value: 599 Ohm
Lead Channel Pacing Threshold Amplitude: 0.625 V
Lead Channel Pacing Threshold Amplitude: 1.375 V
Lead Channel Pacing Threshold Pulse Width: 0.4 ms
Lead Channel Pacing Threshold Pulse Width: 0.4 ms
Lead Channel Setting Pacing Amplitude: 2 V
Lead Channel Setting Pacing Amplitude: 3 V
Lead Channel Setting Pacing Pulse Width: 0.4 ms
Lead Channel Setting Sensing Sensitivity: 4 mV

## 2021-05-23 NOTE — Progress Notes (Signed)
Remote pacemaker transmission.   

## 2021-05-25 ENCOUNTER — Telehealth: Payer: Self-pay

## 2021-05-25 NOTE — Progress Notes (Signed)
    Chronic Care Management Pharmacy Assistant   Name: Kyna Blahnik  MRN: [id number] DOB: Jun 20, 1941  Spoke with patient to schedule an appointment with Lois Huxley for medication review. Patient states to call back next month, just reviewed medications with Dr. Quay Burow and does not wish to schedule at this time  [facility] Pharmacist Assistant [phone]

## 2021-06-06 ENCOUNTER — Other Ambulatory Visit: Payer: Self-pay | Admitting: Internal Medicine

## 2021-06-06 NOTE — Telephone Encounter (Signed)
Confirmed with Milas Hock D, okay for pt to stay at current dose of pradaxa.   Refill sent.

## 2021-06-06 NOTE — Telephone Encounter (Signed)
Hx: afib. LOV: klein 07/26/2020, Scr: 1.07, 12/14/2020, Age: 80, weight: 46 kg CrCl: 30 ml/min.

## 2021-06-13 ENCOUNTER — Encounter (INDEPENDENT_AMBULATORY_CARE_PROVIDER_SITE_OTHER): Payer: Self-pay

## 2021-06-23 ENCOUNTER — Telehealth: Payer: Self-pay | Admitting: Internal Medicine

## 2021-06-23 NOTE — Telephone Encounter (Signed)
Patient daughter [name] calling in  [place] says patient is needing new referral submitted for a therapist.. she said there was a referral submitted years ago for patient but they are needing a new one  Please call patient back once referral has been submitted [phone]

## 2021-06-24 ENCOUNTER — Encounter: Payer: Self-pay | Admitting: Internal Medicine

## 2021-06-24 DIAGNOSIS — F3131 Bipolar disorder, current episode depressed, mild: Secondary | ICD-10-CM

## 2021-06-24 NOTE — Telephone Encounter (Signed)
Spoke with daughter today 

## 2021-06-28 ENCOUNTER — Telehealth: Payer: Self-pay | Admitting: *Deleted

## 2021-06-28 DIAGNOSIS — I10 Essential (primary) hypertension: Secondary | ICD-10-CM | POA: Diagnosis not present

## 2021-06-28 DIAGNOSIS — E063 Autoimmune thyroiditis: Secondary | ICD-10-CM | POA: Diagnosis not present

## 2021-06-28 DIAGNOSIS — E782 Mixed hyperlipidemia: Secondary | ICD-10-CM | POA: Diagnosis not present

## 2021-06-28 DIAGNOSIS — E559 Vitamin D deficiency, unspecified: Secondary | ICD-10-CM | POA: Diagnosis not present

## 2021-06-28 DIAGNOSIS — E119 Type 2 diabetes mellitus without complications: Secondary | ICD-10-CM | POA: Diagnosis not present

## 2021-06-28 NOTE — Telephone Encounter (Signed)
CALLED PATIENT TO ASK ABOUT RESCHEDULING FU FOR 07-25-21 DUE TO DR. KINARD BEING OFF, PATIENT AGREED TO COME ON 08-18-21 @ 9:30 AM

## 2021-06-29 ENCOUNTER — Telehealth: Payer: Self-pay

## 2021-06-29 DIAGNOSIS — F3111 Bipolar disorder, current episode manic without psychotic features, mild: Secondary | ICD-10-CM

## 2021-06-29 LAB — LIPID PANEL
Cholesterol: 122 mg/dL (ref <200)
HDL: 68 mg/dL (ref >=50)
LDL Cholesterol (Calc): 33 mg/dL (calc)
Non-HDL Cholesterol (Calc): 54 mg/dL (calc) (ref <130)
Total CHOL/HDL Ratio: 1.8 (calc) (ref <5.0)
Triglycerides: 119 mg/dL (ref <150)

## 2021-06-29 LAB — MICROALBUMIN / CREATININE URINE RATIO
Creatinine, Urine: 115 mg/dL (ref 20–275)
Microalb Creat Ratio: 5 mcg/mg creat (ref <30)
Microalb, Ur: 0.6 mg/dL

## 2021-06-29 LAB — COMPREHENSIVE METABOLIC PANEL
AG Ratio: 1.8 (calc) (ref 1.0–2.5)
ALT: 19 U/L (ref 6–29)
AST: 21 U/L (ref 10–35)
Albumin: 4.2 g/dL (ref 3.6–5.1)
Alkaline phosphatase (APISO): 48 U/L (ref 37–153)
BUN/Creatinine Ratio: 21 (calc) (ref 6–22)
BUN: 20 mg/dL (ref 7–25)
CO2: 31 mmol/L (ref 20–32)
Calcium: 10.5 mg/dL — ABNORMAL HIGH (ref 8.6–10.4)
Chloride: 106 mmol/L (ref 98–110)
Creat: 0.96 mg/dL — ABNORMAL HIGH (ref 0.60–0.95)
Globulin: 2.3 g/dL (calc) (ref 1.9–3.7)
Glucose, Bld: 116 mg/dL — ABNORMAL HIGH (ref 65–99)
Potassium: 4.4 mmol/L (ref 3.5–5.3)
Sodium: 142 mmol/L (ref 135–146)
Total Bilirubin: 0.4 mg/dL (ref 0.2–1.2)
Total Protein: 6.5 g/dL (ref 6.1–8.1)

## 2021-06-29 LAB — TSH: TSH: 4.23 mIU/L (ref 0.40–4.50)

## 2021-06-29 LAB — PTH, INTACT AND CALCIUM
Calcium: 10.5 mg/dL — ABNORMAL HIGH (ref 8.6–10.4)
PTH: 25 pg/mL (ref 16–77)

## 2021-06-29 LAB — T3, FREE: T3, Free: 2.5 pg/mL (ref 2.3–4.2)

## 2021-06-29 LAB — VITAMIN D 25 HYDROXY (VIT D DEFICIENCY, FRACTURES): Vit D, 25-Hydroxy: 57 ng/mL (ref 30–100)

## 2021-06-29 LAB — T4, FREE: Free T4: 1.1 ng/dL (ref 0.8–1.8)

## 2021-06-29 NOTE — Telephone Encounter (Signed)
Referral ordered

## 2021-07-05 NOTE — Telephone Encounter (Signed)
Faxed today

## 2021-07-06 ENCOUNTER — Encounter (INDEPENDENT_AMBULATORY_CARE_PROVIDER_SITE_OTHER): Payer: Self-pay

## 2021-07-06 NOTE — Telephone Encounter (Signed)
My-chart send on yesterday with update

## 2021-07-11 ENCOUNTER — Ambulatory Visit (INDEPENDENT_AMBULATORY_CARE_PROVIDER_SITE_OTHER): Payer: Medicare HMO | Admitting: "Endocrinology

## 2021-07-14 ENCOUNTER — Ambulatory Visit (INDEPENDENT_AMBULATORY_CARE_PROVIDER_SITE_OTHER): Payer: Medicare HMO | Admitting: "Endocrinology

## 2021-07-15 ENCOUNTER — Other Ambulatory Visit: Payer: Self-pay | Admitting: Internal Medicine

## 2021-07-20 ENCOUNTER — Ambulatory Visit
Admission: RE | Admit: 2021-07-20 | Discharge: 2021-07-20 | Disposition: A | Discharge status: Home / Self Care | Payer: Medicare HMO | Source: Ambulatory Visit | Attending: Internal Medicine | Admitting: Internal Medicine

## 2021-07-20 ENCOUNTER — Other Ambulatory Visit: Payer: Self-pay

## 2021-07-20 ENCOUNTER — Other Ambulatory Visit: Payer: Self-pay | Admitting: Internal Medicine

## 2021-07-20 DIAGNOSIS — Z9889 Other specified postprocedural states: Secondary | ICD-10-CM

## 2021-07-20 DIAGNOSIS — Z1231 Encounter for screening mammogram for malignant neoplasm of breast: Secondary | ICD-10-CM | POA: Diagnosis not present

## 2021-07-24 ENCOUNTER — Encounter: Payer: Self-pay | Admitting: Internal Medicine
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
## 2021-07-25 ENCOUNTER — Other Ambulatory Visit: Payer: Self-pay

## 2021-07-25 ENCOUNTER — Encounter: Payer: Self-pay | Admitting: Internal Medicine

## 2021-07-25 ENCOUNTER — Ambulatory Visit: Payer: Self-pay | Admitting: Radiation Oncology

## 2021-07-25 ENCOUNTER — Ambulatory Visit (INDEPENDENT_AMBULATORY_CARE_PROVIDER_SITE_OTHER): Payer: Medicare HMO

## 2021-07-25 ENCOUNTER — Ambulatory Visit (INDEPENDENT_AMBULATORY_CARE_PROVIDER_SITE_OTHER): Payer: Medicare HMO | Admitting: Internal Medicine

## 2021-07-25 VITALS — BP 130/70 | HR 66 | Temp 98.1°F | Ht 64.0 in | Wt 99.8 lb

## 2021-07-25 DIAGNOSIS — E782 Mixed hyperlipidemia: Secondary | ICD-10-CM | POA: Diagnosis not present

## 2021-07-25 DIAGNOSIS — R300 Dysuria: Secondary | ICD-10-CM | POA: Diagnosis not present

## 2021-07-25 DIAGNOSIS — N3281 Overactive bladder: Secondary | ICD-10-CM | POA: Diagnosis not present

## 2021-07-25 DIAGNOSIS — E063 Autoimmune thyroiditis: Secondary | ICD-10-CM

## 2021-07-25 DIAGNOSIS — F3111 Bipolar disorder, current episode manic without psychotic features, mild: Secondary | ICD-10-CM

## 2021-07-25 DIAGNOSIS — F03A0 Unspecified dementia, mild, without behavioral disturbance, psychotic disturbance, mood disturbance, and anxiety: Secondary | ICD-10-CM

## 2021-07-25 DIAGNOSIS — R6884 Jaw pain: Secondary | ICD-10-CM | POA: Diagnosis not present

## 2021-07-25 DIAGNOSIS — R918 Other nonspecific abnormal finding of lung field: Secondary | ICD-10-CM

## 2021-07-25 DIAGNOSIS — R0781 Pleurodynia: Secondary | ICD-10-CM | POA: Insufficient documentation

## 2021-07-25 DIAGNOSIS — R410 Disorientation, unspecified: Secondary | ICD-10-CM

## 2021-07-25 DIAGNOSIS — E1169 Type 2 diabetes mellitus with other specified complication: Secondary | ICD-10-CM

## 2021-07-25 DIAGNOSIS — R071 Chest pain on breathing: Secondary | ICD-10-CM

## 2021-07-25 DIAGNOSIS — R350 Frequency of micturition: Secondary | ICD-10-CM | POA: Insufficient documentation

## 2021-07-25 LAB — CBC WITH DIFFERENTIAL/PLATELET
Basophils Absolute: 0.1 10*3/uL (ref 0.0–0.1)
Basophils Relative: 0.6 % (ref 0.0–3.0)
Eosinophils Absolute: 0.1 10*3/uL (ref 0.0–0.7)
Eosinophils Relative: 0.4 % (ref 0.0–5.0)
HCT: 44.2 % (ref 36.0–46.0)
Hemoglobin: 14.5 g/dL (ref 12.0–15.0)
Lymphocytes Relative: 17.4 % (ref 12.0–46.0)
Lymphs Abs: 2 10*3/uL (ref 0.7–4.0)
MCHC: 32.9 g/dL (ref 30.0–36.0)
MCV: 96.2 fl (ref 78.0–100.0)
Monocytes Absolute: 1 10*3/uL (ref 0.1–1.0)
Monocytes Relative: 9.1 % (ref 3.0–12.0)
Neutro Abs: 8.3 10*3/uL — ABNORMAL HIGH (ref 1.4–7.7)
Neutrophils Relative %: 72.5 % (ref 43.0–77.0)
Platelets: 160 10*3/uL (ref 150.0–400.0)
RBC: 4.59 Mil/uL (ref 3.87–5.11)
RDW: 13 % (ref 11.5–15.5)
WBC: 11.5 10*3/uL — ABNORMAL HIGH (ref 4.0–10.5)

## 2021-07-25 LAB — COMPREHENSIVE METABOLIC PANEL
ALT: 17 U/L (ref 0–35)
AST: 21 U/L (ref 0–37)
Albumin: 4.3 g/dL (ref 3.5–5.2)
Alkaline Phosphatase: 50 U/L (ref 39–117)
BUN: 31 mg/dL — ABNORMAL HIGH (ref 6–23)
CO2: 29 mEq/L (ref 19–32)
Calcium: 11 mg/dL — ABNORMAL HIGH (ref 8.4–10.5)
Chloride: 103 mEq/L (ref 96–112)
Creatinine, Ser: 0.93 mg/dL (ref 0.40–1.20)
GFR: 57.97 mL/min — ABNORMAL LOW (ref >60.00)
Glucose, Bld: 113 mg/dL — ABNORMAL HIGH (ref 70–99)
Potassium: 4.1 mEq/L (ref 3.5–5.1)
Sodium: 139 mEq/L (ref 135–145)
Total Bilirubin: 0.5 mg/dL (ref 0.2–1.2)
Total Protein: 7.4 g/dL (ref 6.0–8.3)

## 2021-07-25 LAB — URINALYSIS, ROUTINE W REFLEX MICROSCOPIC
Bilirubin Urine: NEGATIVE
Hgb urine dipstick: NEGATIVE
Ketones, ur: NEGATIVE
Leukocytes,Ua: NEGATIVE
Nitrite: NEGATIVE
Specific Gravity, Urine: 1.015 (ref 1.000–1.030)
Total Protein, Urine: NEGATIVE
Urine Glucose: NEGATIVE
Urobilinogen, UA: 0.2 (ref 0.0–1.0)
pH: 6.5 (ref 5.0–8.0)

## 2021-07-25 NOTE — Assessment & Plan Note (Signed)
Chronic Regular exercise and healthy diet encouraged Check lipid panel  Continue atorvastatin 80 mg 3 days a week

## 2021-07-25 NOTE — Assessment & Plan Note (Signed)
Chronic Stable Continue Aricept 10 mg nightly

## 2021-07-25 NOTE — Assessment & Plan Note (Signed)
Chronic Management per endocrine Clinically euthyroid

## 2021-07-25 NOTE — Assessment & Plan Note (Signed)
Chronic Stable, controlled Continue Ditropan 5 mg daily

## 2021-07-25 NOTE — Assessment & Plan Note (Signed)
Acute Has had jaw pain, fatigue, decreased stamina among other symptoms, which could be concerning for cardiac cause Denies any chest pain, but does have some chronic shortness of breath and is unsure if it is worse or not EKG today shows atrial paced rhythm with prolonged AV conduction at 60 bpm, poor R wave progression, otherwise normal EKG.  EKG unchanged from previous EKG from 09/2020 except that now she is in a paced rhythm as were before she is not

## 2021-07-25 NOTE — Assessment & Plan Note (Signed)
Chronic Following with psychiatry For daughter wonders about decreasing/taking her off of lithium and will discuss that with psychiatry Given all of her symptoms we will check lithium level

## 2021-07-25 NOTE — Patient Instructions (Addendum)
° °  An EKG was done today.   Have blood work and urine tests done and a chest xray done downstairs.    Medications changes include :   none

## 2021-07-25 NOTE — Assessment & Plan Note (Signed)
Acute Has had some increased confusion recently and is slower to respond ?  Infection,?  Related to dementia Chest x-ray, urine test, CBC, CMP to rule out infection Depending on results may need to evaluate further with other tests

## 2021-07-25 NOTE — Assessment & Plan Note (Signed)
Acute Has had some dysuria and has history of UTIs This could explain some of her symptoms that she is experiencing Urinalysis, urine culture

## 2021-07-25 NOTE — Assessment & Plan Note (Signed)
Chronic Well-controlled Management per endocrine

## 2021-07-25 NOTE — Assessment & Plan Note (Signed)
Acute Before Thanksgiving she fell and injured her right side and it seemed to get better, but she still complains of right rib/right upper quadrant pain when she takes deep breaths, but not with movement or coughing Ribs are nontender to palpation Right upper quadrant is nontender to palpation Will get rib/chest x-ray If negative may need to consider abdominal ultrasound

## 2021-07-26 LAB — URINE CULTURE: Result:: NO GROWTH

## 2021-07-26 LAB — LITHIUM LEVEL: Lithium Lvl: 0.6 mmol/L (ref 0.6–1.2)
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
## 2021-08-03 ENCOUNTER — Other Ambulatory Visit: Payer: Self-pay

## 2021-08-03 ENCOUNTER — Encounter (INDEPENDENT_AMBULATORY_CARE_PROVIDER_SITE_OTHER): Payer: Self-pay | Admitting: "Endocrinology

## 2021-08-03 ENCOUNTER — Ambulatory Visit (INDEPENDENT_AMBULATORY_CARE_PROVIDER_SITE_OTHER): Payer: Medicare HMO | Admitting: "Endocrinology

## 2021-08-03 ENCOUNTER — Telehealth (INDEPENDENT_AMBULATORY_CARE_PROVIDER_SITE_OTHER): Payer: Self-pay | Admitting: "Endocrinology

## 2021-08-03 VITALS — BP 130/70 | HR 72 | Ht 64.0 in | Wt 98.6 lb

## 2021-08-03 DIAGNOSIS — I10 Essential (primary) hypertension: Secondary | ICD-10-CM

## 2021-08-03 DIAGNOSIS — F17200 Nicotine dependence, unspecified, uncomplicated: Secondary | ICD-10-CM

## 2021-08-03 DIAGNOSIS — E049 Nontoxic goiter, unspecified: Secondary | ICD-10-CM

## 2021-08-03 DIAGNOSIS — F3131 Bipolar disorder, current episode depressed, mild: Secondary | ICD-10-CM

## 2021-08-03 DIAGNOSIS — E559 Vitamin D deficiency, unspecified: Secondary | ICD-10-CM

## 2021-08-03 DIAGNOSIS — K141 Geographic tongue: Secondary | ICD-10-CM

## 2021-08-03 DIAGNOSIS — H833X3 Noise effects on inner ear, bilateral: Secondary | ICD-10-CM

## 2021-08-03 DIAGNOSIS — R231 Pallor: Secondary | ICD-10-CM

## 2021-08-03 DIAGNOSIS — R251 Tremor, unspecified: Secondary | ICD-10-CM

## 2021-08-03 DIAGNOSIS — E782 Mixed hyperlipidemia: Secondary | ICD-10-CM

## 2021-08-03 DIAGNOSIS — E1169 Type 2 diabetes mellitus with other specified complication: Secondary | ICD-10-CM

## 2021-08-03 DIAGNOSIS — E063 Autoimmune thyroiditis: Secondary | ICD-10-CM | POA: Diagnosis not present

## 2021-08-03 DIAGNOSIS — E210 Primary hyperparathyroidism: Secondary | ICD-10-CM

## 2021-08-03 DIAGNOSIS — R413 Other amnesia: Secondary | ICD-10-CM

## 2021-08-03 DIAGNOSIS — R432 Parageusia: Secondary | ICD-10-CM

## 2021-08-03 DIAGNOSIS — R634 Abnormal weight loss: Secondary | ICD-10-CM

## 2021-08-03 DIAGNOSIS — R5383 Other fatigue: Secondary | ICD-10-CM

## 2021-08-03 LAB — POCT GLYCOSYLATED HEMOGLOBIN (HGB A1C): HbA1c, POC (prediabetic range): 6 % (ref 5.7–6.4)

## 2021-08-03 LAB — POCT GLUCOSE (DEVICE FOR HOME USE): POC Glucose: 147 mg/dl — AB (ref 70–99)

## 2021-08-03 MED ORDER — LEVOTHYROXINE SODIUM 112 MCG PO TABS: ORAL_TABLET | ORAL | 3 refills | Status: AC

## 2021-08-03 NOTE — Telephone Encounter (Signed)
Secure chat sent to Dr. Tobe Sos

## 2021-08-03 NOTE — Patient Instructions (Signed)
Follow up visit in 3 months. Please repeat lab tests 1-2 weeks prior.  

## 2021-08-03 NOTE — Telephone Encounter (Signed)
°  Who's calling (name and relationship to patient) :Daughter/ Wilhemina Cash contact number:[phone]  Provider they see:Dr.Brennan   Reason for call:Patients daughter called in to let Dr.Brennan know that Brends is taking the Synthroid. She stated that in her appointment today she was unsure what medication to tell Dr. Varney Baas she was taking.      PRESCRIPTION REFILL ONLY  Name of prescription:  Pharmacy:

## 2021-08-05 ENCOUNTER — Ambulatory Visit: Payer: Medicare HMO | Admitting: Internal Medicine

## 2021-08-05 ENCOUNTER — Encounter: Payer: Self-pay | Admitting: Internal Medicine

## 2021-08-05 ENCOUNTER — Other Ambulatory Visit: Payer: Self-pay

## 2021-08-05 VITALS — BP 104/62 | HR 64 | Ht 64.0 in | Wt 99.4 lb

## 2021-08-05 DIAGNOSIS — I4891 Unspecified atrial fibrillation: Secondary | ICD-10-CM

## 2021-08-05 DIAGNOSIS — Z950 Presence of cardiac pacemaker: Secondary | ICD-10-CM | POA: Diagnosis not present

## 2021-08-05 DIAGNOSIS — I442 Atrioventricular block, complete: Secondary | ICD-10-CM | POA: Diagnosis not present
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
## 2021-08-05 NOTE — Patient Instructions (Signed)

## 2021-08-08 ENCOUNTER — Encounter: Payer: Self-pay | Admitting: Internal Medicine

## 2021-08-10 ENCOUNTER — Other Ambulatory Visit: Payer: Self-pay

## 2021-08-10 ENCOUNTER — Ambulatory Visit (INDEPENDENT_AMBULATORY_CARE_PROVIDER_SITE_OTHER)
Admission: RE | Admit: 2021-08-10 | Discharge: 2021-08-10 | Disposition: A | Discharge status: Home / Self Care | Payer: Medicare HMO | Source: Ambulatory Visit | Attending: Internal Medicine | Admitting: Internal Medicine

## 2021-08-10 DIAGNOSIS — J439 Emphysema, unspecified: Secondary | ICD-10-CM | POA: Diagnosis not present

## 2021-08-10 DIAGNOSIS — R071 Chest pain on breathing: Secondary | ICD-10-CM | POA: Diagnosis not present

## 2021-08-10 DIAGNOSIS — R918 Other nonspecific abnormal finding of lung field: Secondary | ICD-10-CM | POA: Diagnosis not present

## 2021-08-10 DIAGNOSIS — R079 Chest pain, unspecified: Secondary | ICD-10-CM | POA: Diagnosis not present

## 2021-08-10 DIAGNOSIS — R911 Solitary pulmonary nodule: Secondary | ICD-10-CM | POA: Diagnosis not present

## 2021-08-11 ENCOUNTER — Telehealth: Payer: Self-pay | Admitting: Internal Medicine

## 2021-08-11 ENCOUNTER — Encounter: Payer: Self-pay | Admitting: Internal Medicine

## 2021-08-11 ENCOUNTER — Ambulatory Visit: Payer: Medicare HMO | Admitting: Radiation Oncology

## 2021-08-11 NOTE — Telephone Encounter (Signed)
Message sent to daughter via mychart and referral ordered for pulmonary

## 2021-08-11 NOTE — Telephone Encounter (Signed)
Ct chest without contrast new right lower lobe nodular density peripheral right lower lobe nodular density malignancy not found Consider  three months chest ct or tissue sample.   [facility] Radiology [phone]

## 2021-08-11 NOTE — Addendum Note (Signed)
Addended by: Binnie Rail on: 08/11/2021 04:25 PM   Modules accepted: Orders

## 2021-08-12 ENCOUNTER — Ambulatory Visit (INDEPENDENT_AMBULATORY_CARE_PROVIDER_SITE_OTHER): Payer: Medicare HMO

## 2021-08-12 DIAGNOSIS — I442 Atrioventricular block, complete: Secondary | ICD-10-CM | POA: Diagnosis not present

## 2021-08-12 LAB — CUP PACEART REMOTE DEVICE CHECK
Battery Impedance: 351 Ohm
Battery Remaining Longevity: 118 mo
Battery Voltage: 2.79 V
Brady Statistic AP VP Percent: 12 %
Brady Statistic AP VS Percent: 32 %
Brady Statistic AS VP Percent: 1 %
Brady Statistic AS VS Percent: 55 %
Date Time Interrogation Session: 20230105174045
Implantable Lead Implant Date: 20010521
Implantable Lead Implant Date: 20010521
Implantable Lead Location: 753859
Implantable Lead Location: 753860
Implantable Lead Model: 4592
Implantable Lead Model: 6940
Implantable Pulse Generator Implant Date: 20161229
Lead Channel Impedance Value: 1078 Ohm
Lead Channel Impedance Value: 599 Ohm
Lead Channel Pacing Threshold Amplitude: 0.5 V
Lead Channel Pacing Threshold Amplitude: 1.375 V
Lead Channel Pacing Threshold Pulse Width: 0.4 ms
Lead Channel Pacing Threshold Pulse Width: 0.4 ms
Lead Channel Setting Pacing Amplitude: 2 V
Lead Channel Setting Pacing Amplitude: 2.75 V
Lead Channel Setting Pacing Pulse Width: 0.4 ms
Lead Channel Setting Sensing Sensitivity: 4 mV

## 2021-08-13 ENCOUNTER — Telehealth: Payer: Medicare HMO | Admitting: Nurse Practitioner

## 2021-08-13 DIAGNOSIS — U071 COVID-19: Secondary | ICD-10-CM | POA: Diagnosis not present

## 2021-08-13 MED ORDER — MOLNUPIRAVIR EUA 200MG CAPSULE: 4.0000 | ORAL_CAPSULE | Freq: Two times a day (BID) | ORAL | 0 refills | Status: AC

## 2021-08-13 MED ORDER — ALBUTEROL SULFATE HFA 108 (90 BASE) MCG/ACT IN AERS: 2.0000 | INHALATION_SPRAY | Freq: Four times a day (QID) | RESPIRATORY_TRACT | 0 refills | Status: AC | PRN

## 2021-08-13 NOTE — Progress Notes (Signed)
Virtual Visit Consent   Stacy [name], you are scheduled for a virtual visit with a [facility] provider today.     Just as with appointments in the office, your consent must be obtained to participate.  Your consent will be active for this visit and any virtual visit you may have with one of our providers in the next 365 days.     If you have a MyChart account, a copy of this consent can be sent to you electronically.  All virtual visits are billed to your insurance company just like a traditional visit in the office.    As this is a virtual visit, video technology does not allow for your provider to perform a traditional examination.  This may limit your provider's ability to fully assess your condition.  If your provider identifies any concerns that need to be evaluated in person or the need to arrange testing (such as labs, EKG, etc.), we will make arrangements to do so.     Although advances in technology are sophisticated, we cannot ensure that it will always work on either your end or our end.  If the connection with a video visit is poor, the visit may have to be switched to a telephone visit.  With either a video or telephone visit, we are not always able to ensure that we have a secure connection.     I need to obtain your verbal consent now.   Are you willing to proceed with your visit today?    Stacy [name] has provided verbal consent on 08/13/2021 for a virtual visit (video or telephone).   Apolonio Schneiders, FNP   Date: 08/13/2021 9:29 AM   Virtual Visit via Video Note   I, Apolonio Schneiders, connected with  Stacy [name]  ([id number], Dec 13, 1940) on 08/13/21 at  9:30 AM EST by a video-enabled telemedicine application and verified that I am speaking with the correct person using two identifiers.  Location: Patient: Virtual Visit Location Patient: Home Provider: Virtual Visit Location Provider: Home Office   I discussed the limitations of evaluation and  management by telemedicine and the availability of in person appointments. The patient expressed understanding and agreed to proceed.    History of Present Illness: Stacy [name] is a 81 y.o. who identifies as a female who was assigned female at birth, and is being seen today after testing positive for COVID today.   She has a cough and congestion today. When she takes a deep breath she has pain in her right lower lobe    Cough today has improved compared to earlier in the week. She has been taking Mucinex for the past week as advised by her PCP.   Has recently had a CT chest with diagnosis of right lower lobe nodular density noted.   She does have emphysema has never used an inhaler.   She has been vaccinated for COVID  First episode of COVID   Feels overall better today compared to earlier in the week  Daughter is a Marine scientist and is staying with her and monitoring symptoms as well   Problems:  Patient Active Problem List   Diagnosis Date Noted   Dysuria 07/25/2021   Confusion 07/25/2021   Rib pain on right side 07/25/2021   Jaw pain 07/25/2021   Acute cystitis 10/04/2020   BMI less than 19,adult 07/22/2020   Physical deconditioning 03/07/2018   Hearing loss 01/09/2018   Mild dementia ([place]) 01/09/2018   Ductal carcinoma in  situ (DCIS) of right breast 09/09/2017   Unintentional weight loss 08/22/2017   Closed fracture of nasal bones 08/10/2017   Closed fracture of left orbital floor ([place]) 08/10/2017   GERD (gastroesophageal reflux disease) 07/12/2016   Bilateral shoulder pain 04/26/2016   Polyarthralgia 04/03/2016   Greater trochanteric bursitis of left hip 06/17/2015   Allergic rhinitis 08/04/2013   Hypothyroidism, acquired, autoimmune 05/10/2013   OAB (overactive bladder)    Primary hyperparathyroidism ([place])    Vitamin D deficiency disease    Pacemaker-Medtronic-dual-chamber 11/17/2010   Diabetes ([place]) 09/08/2010   ATRIAL FIBRILLATION 11/11/2009   TOBACCO ABUSE  03/22/2009   CAD S/P percutaneous coronary angioplasty 03/26/2008   AV block-complete-intermittent 03/26/2008   Hyperlipidemia 02/25/2008   Bipolar disorder ([place]) 02/25/2008   DIVERTICULOSIS, COLON 02/25/2008    Allergies:  Allergies  Allergen Reactions   Penicillins Anaphylaxis, Swelling, Rash and Other (See Comments)    THROAT SWELLING PATIENT HAS HAD A PCN REACTION WITH IMMEDIATE RASH, FACIAL/TONGUE/THROAT SWELLING, SOB, OR LIGHTHEADEDNESS WITH HYPOTENSION:  #  #  #  YES  #  #  #   Has patient had a PCN reaction causing severe rash involving mucus membranes or skin necrosis: No Has patient had a PCN reaction that required hospitalization No Has patient had a PCN reaction occurring within the last 10 years: #  #  #  YES  #  #  #    Latex Dermatitis and Other (See Comments)    UNDOCUMENTED SEVERITY   Adhesive [Tape] Rash   Codeine Nausea And Vomiting   Metronidazole Nausea And Vomiting   Other Rash and Other (See Comments)    Bandaids cause rash   Medications:  Current Outpatient Medications:    ACCU-CHEK AVIVA PLUS test strip, TEST BLOOD SUGAR TWICE DAILY, Disp: 200 strip, Rfl: 1   acetaminophen (TYLENOL) 500 MG tablet, Take 500 mg by mouth every 6 (six) hours as needed (for pain.)., Disp: , Rfl:    Alcohol Swabs (B-D SINGLE USE SWABS REGULAR) PADS, USE TWICE DAILY, Disp: 200 each, Rfl: 2   atorvastatin (LIPITOR) 80 MG tablet, TAKE 1 TABLET DAILY ON MONDAY WEDNESDAY FRIDAY AND SUNDAY AT 6PM, Disp: 48 tablet, Rfl: 3   Biotin 10000 MCG TABS, Take 10,000 mcg by mouth at bedtime., Disp: , Rfl:    busPIRone (BUSPAR) 15 MG tablet, Take 15 mg by mouth in the morning., Disp: , Rfl:    Calcium Carb-Cholecalciferol (CALCIUM+D3 PO), Take 1 tablet by mouth at bedtime., Disp: , Rfl:    dabigatran (PRADAXA) 150 MG CAPS capsule, TAKE 1 CAPSULE TWICE DAILY, Disp: 180 capsule, Rfl: 1   diclofenac Sodium (VOLTAREN) 1 % GEL, Apply 4 g topically 4 (four) times daily., Disp: 100 g, Rfl: 5   donepezil  (ARICEPT) 10 MG tablet, TAKE 1 TABLET AT BEDTIME, Disp: 90 tablet, Rfl: 1   ENSURE (ENSURE), Take 237 mLs by mouth 2 (two) times daily between meals. (Patient taking differently: Take 237 mLs by mouth daily. Premier Protein at bedtime), Disp: 42660 mL, Rfl: 3   FLUoxetine (PROZAC) 20 MG capsule, Take 20 mg by mouth in the morning., Disp: , Rfl:    hydrocortisone (ANUSOL-HC) 2.5 % rectal cream, USE 1 APPLICATION RECTALLY TWICE DAILY, Disp: 30 g, Rfl: 11   ketotifen (ZADITOR) 0.025 % ophthalmic solution, Place 1 drop into both eyes as needed (for allergies)., Disp: , Rfl:    levothyroxine (SYNTHROID) 112 MCG tablet, Take 1.5 tablets per day on 4 days each week and one tablet  per day on three days each week., Disp: 135 tablet, Rfl: 3   lithium carbonate (LITHOBID) 300 MG CR tablet, Take 300 mg by mouth at bedtime., Disp: , Rfl:    loratadine (CLARITIN) 10 MG tablet, Take 10 mg by mouth in the morning., Disp: , Rfl:    Melatonin 5 MG TABS, Take 2.5 mg by mouth at bedtime., Disp: , Rfl:    metFORMIN (GLUCOPHAGE-XR) 500 MG 24 hr tablet, TAKE 1 TABLET EVERY DAY WITH BREAKFAST, Disp: 90 tablet, Rfl: 3   Multiple Vitamin (MULTIVITAMIN WITH MINERALS) TABS tablet, Take 1 tablet by mouth daily. , Disp: , Rfl:    non-metallic deodorant (ALRA) MISC, Apply 1 application topically daily as needed (for dryness)., Disp: , Rfl:    ondansetron (ZOFRAN ODT) 4 MG disintegrating tablet, Take 1 tablet (4 mg total) by mouth every 8 (eight) hours as needed for nausea or vomiting., Disp: 20 tablet, Rfl: 0   oxybutynin (DITROPAN) 5 MG tablet, Take 1 tablet (5 mg total) by mouth daily., Disp: 90 tablet, Rfl: 1   Polyethyl Glycol-Propyl Glycol 0.4-0.3 % SOLN, Place 1-2 drops into both eyes 3 (three) times daily as needed (for dry/irritated eyes.)., Disp: , Rfl:    PRODIGY TWIST TOP LANCETS 28G MISC, CHECK BLOOD SUGAR EVERY DAY, Disp: 100 each, Rfl: 1   pyridoxine (B-6) 500 MG tablet, Take 500 mg by mouth in the morning., Disp: ,  Rfl:    pyridOXINE (VITAMIN B-6) 100 MG tablet, Take 100 mg by mouth at bedtime., Disp: , Rfl:    SYSTANE OVERNIGHT THERAPY 0.3 % GEL ophthalmic ointment, Place 1 application into both eyes at bedtime., Disp: , Rfl:    Thiamine HCl (VITAMIN B-1) 250 MG tablet, Take 250 mg by mouth at bedtime., Disp: , Rfl:    vitamin B-12 (CYANOCOBALAMIN) 1000 MCG tablet, Take 1,000-3,000 mcg by mouth in the morning., Disp: , Rfl:   Observations/Objective: Patient is well-developed, well-nourished in no acute distress.  Resting comfortably at home.  Head is normocephalic, atraumatic.  No labored breathing.  Speech is clear and coherent with logical content.  Patient is alert and oriented at baseline.    Assessment and Plan: 1. COVID-19 Take anti-viral with food.  Continue Mucinex as discussed  Continue to monitor symptoms   - molnupiravir EUA (LAGEVRIO) 200 mg CAPS capsule; Take 4 capsules (800 mg total) by mouth 2 (two) times daily for 5 days.  Dispense: 40 capsule; Refill: 0 - albuterol (VENTOLIN HFA) 108 (90 Base) MCG/ACT inhaler; Inhale 2 puffs into the lungs every 6 (six) hours as needed for wheezing or shortness of breath.  Dispense: 8 g; Refill: 0     Follow Up Instructions: I discussed the assessment and treatment plan with the patient. The patient was provided an opportunity to ask questions and all were answered. The patient agreed with the plan and demonstrated an understanding of the instructions.  A copy of instructions were sent to the patient via MyChart unless otherwise noted below.     The patient was advised to call back or seek an in-person evaluation if the symptoms worsen or if the condition fails to improve as anticipated.  Time:  I spent 20 minutes with the patient via telehealth technology discussing the above problems/concerns.    Apolonio Schneiders, FNP

## 2021-08-14 ENCOUNTER — Encounter: Payer: Self-pay | Admitting: Internal Medicine

## 2021-08-17 ENCOUNTER — Encounter: Payer: Self-pay | Admitting: Internal Medicine

## 2021-08-18 ENCOUNTER — Ambulatory Visit: Payer: Medicare HMO | Admitting: Radiation Oncology

## 2021-08-22 ENCOUNTER — Encounter: Payer: Self-pay | Admitting: Radiation Oncology

## 2021-08-22 ENCOUNTER — Other Ambulatory Visit: Payer: Self-pay

## 2021-08-22 ENCOUNTER — Ambulatory Visit
Admission: RE | Admit: 2021-08-22 | Discharge: 2021-08-22 | Disposition: A | Discharge status: Home / Self Care | Payer: Medicare HMO | Source: Ambulatory Visit | Attending: Radiation Oncology | Admitting: Radiation Oncology

## 2021-08-22 VITALS — BP 141/70 | HR 64 | Temp 97.7°F | Resp 20 | Ht 64.5 in | Wt 97.6 lb

## 2021-08-22 DIAGNOSIS — Z08 Encounter for follow-up examination after completed treatment for malignant neoplasm: Secondary | ICD-10-CM | POA: Diagnosis not present

## 2021-08-22 DIAGNOSIS — Z79899 Other long term (current) drug therapy: Secondary | ICD-10-CM | POA: Diagnosis not present

## 2021-08-22 DIAGNOSIS — I10 Essential (primary) hypertension: Secondary | ICD-10-CM | POA: Insufficient documentation

## 2021-08-22 DIAGNOSIS — E119 Type 2 diabetes mellitus without complications: Secondary | ICD-10-CM | POA: Insufficient documentation

## 2021-08-22 DIAGNOSIS — I4891 Unspecified atrial fibrillation: Secondary | ICD-10-CM | POA: Insufficient documentation

## 2021-08-22 DIAGNOSIS — J439 Emphysema, unspecified: Secondary | ICD-10-CM | POA: Diagnosis not present

## 2021-08-22 DIAGNOSIS — R634 Abnormal weight loss: Secondary | ICD-10-CM | POA: Diagnosis not present

## 2021-08-22 DIAGNOSIS — Z86000 Personal history of in-situ neoplasm of breast: Secondary | ICD-10-CM | POA: Insufficient documentation

## 2021-08-22 DIAGNOSIS — Z923 Personal history of irradiation: Secondary | ICD-10-CM | POA: Insufficient documentation

## 2021-08-22 DIAGNOSIS — R410 Disorientation, unspecified: Secondary | ICD-10-CM | POA: Diagnosis not present

## 2021-08-22 DIAGNOSIS — Z7984 Long term (current) use of oral hypoglycemic drugs: Secondary | ICD-10-CM | POA: Diagnosis not present

## 2021-08-22 DIAGNOSIS — D0511 Intraductal carcinoma in situ of right breast: Secondary | ICD-10-CM

## 2021-08-22 NOTE — Progress Notes (Signed)
Stacy [name] is here today for follow up post radiation to the breast.   Breast Side:right   They completed their radiation on: 11/20/2017   Does the patient complain of any of the following: Post radiation skin issues: n/a Breast Tenderness: denies Breast Swelling: denies Lymphadema: denies Range of Motion limitations: demonstrates full range of motion Fatigue post radiation: "all the time" Appetite good/fair/poor: poor  Additional comments if applicable: new pulmonary nodules  Vitals:   08/22/21 1609  BP: (!) 141/70  Pulse: 64  Resp: 20  Temp: 97.7 F (36.5 C)  SpO2: 99%  Weight: 97 lb 9.6 oz (44.3 kg)  Height: 5' 4.5" (1.638 m)

## 2021-08-22 NOTE — Progress Notes (Signed)
Remote pacemaker transmission.   

## 2021-08-22 NOTE — Progress Notes (Signed)
Radiation Oncology         [phone]) [phone] ________________________________  Name: Stacy [name] MRN: [id number]  Date: 08/22/2021  DOB: [birth year]  Follow-Up Visit Note  CC: Stacy Rail, MD  Stacy Rail, MD    ICD-10-CM   1. Ductal carcinoma in situ (DCIS) of right breast  D05.11       Diagnosis: High-grade, DCIS of right breast, ER/PR (neg) Pathological stage: pTis, pNX    Interval Since Last Radiation: 3 years, and 9 months   Radiation treatment dates:  10/24/2017 - 11/20/2017   Site/dose: 1. Right breast / 2.67 Gy x 15 fractions for a total dose of 40.05 Gy 2. Boost / 2 Gy x 5 fractions, for a total dose of 10 Gy  Narrative:  The patient returns today for routine 6 month follow-up, (she was last seen here for follow up on 01/20/21). Since then, the patient has maintained follow up visits with psychiatry and endo.         During a visit with Dr. Quay [name] (PCP) on 07/25/21, the patient reported that she fell the day before thanksgiving. A week after this, the patient began to notice some pain in her right side which eventually resolved. The patient also reported onset of weakness and lightheadedness following her mammogram on 07/20/21. Per Dr. Quay [name], receiving mammograms always seems to take a lot out of the patient, and she takes a while to recover her strength. Dr. Quay [name] also noted that the patient displayed increased confusion, appeared weaker in general, and lost some weight in the interval due to eating/drinking less. XR of the right rib cage performed during this visit revealed no acute osseous abnormalities, and COPD changes without infiltrate. Although XR did not show any acute findings, Dr. Quay [name] recommended further workup via CT.           Chest CT on 08/10/21 showed new right lower lobe pleural-based nodular densities, with some additional peripheral right lower lobe peripheral nodular densities. The largest area of which was seen to measure 1.9 x 0.7 x 2.2 cm.  Malignancy could not be excluded in respects to this finding. In addition, a new left upper lobe ground-glass nodule measuring 6 mm was appreciated.    Other pertinent imaging this far includes:  --Bilateral screening mammogram on 07/20/21 which demonstrated no mammographic evidence of malignancy.   Of note: The patient tested positive for COVID-19 on 08/13/21. The patient was able to meet that day with Stacy [name] (FNP) via telehealth visit. During which time, the patient reported cough, congestion, and right lower lobe pain when she inhales deeply. Patient noted improvement of symptoms with Mucinex as advised by her PCP. Accordingly, she was prescribed an anti-viral (molnupiravir), and albuterol inhaler for prn use.    She will see Dr. Valeta [name] later this month for evaluation of her new pulmonary nodules.  She denies any pain within either breast nipple discharge or bleeding.  She has chronic fatique and poor appetite.           Allergies:  is allergic to penicillins, latex, adhesive [tape], codeine, metronidazole, and other.  Meds: Current Outpatient Medications  Medication Sig Dispense Refill   ACCU-CHEK AVIVA PLUS test strip TEST BLOOD SUGAR TWICE DAILY 200 strip 1   acetaminophen (TYLENOL) 500 MG tablet Take 500 mg by mouth every 6 (six) hours as needed (for pain.).     Alcohol Swabs (B-D SINGLE USE SWABS REGULAR) PADS USE TWICE DAILY 200 each 2   atorvastatin (LIPITOR)  80 MG tablet TAKE 1 TABLET DAILY ON MONDAY WEDNESDAY FRIDAY AND SUNDAY AT 6PM 48 tablet 3   Biotin 10000 MCG TABS Take 10,000 mcg by mouth at bedtime.     busPIRone (BUSPAR) 15 MG tablet Take 15 mg by mouth in the morning.     Calcium Carb-Cholecalciferol (CALCIUM+D3 PO) Take 1 tablet by mouth at bedtime.     dabigatran (PRADAXA) 150 MG CAPS capsule TAKE 1 CAPSULE TWICE DAILY 180 capsule 1   diclofenac Sodium (VOLTAREN) 1 % GEL Apply 4 g topically 4 (four) times daily. 100 g 5   donepezil (ARICEPT) 10 MG tablet TAKE 1  TABLET AT BEDTIME 90 tablet 1   ENSURE (ENSURE) Take 237 mLs by mouth 2 (two) times daily between meals. (Patient taking differently: Take 237 mLs by mouth daily. Premier Protein at bedtime) 42660 mL 3   FLUoxetine (PROZAC) 20 MG capsule Take 20 mg by mouth in the morning.     ketotifen (ZADITOR) 0.025 % ophthalmic solution Place 1 drop into both eyes as needed (for allergies).     levothyroxine (SYNTHROID) 112 MCG tablet Take 1.5 tablets per day on 4 days each week and one tablet per day on three days each week. 135 tablet 3   lithium carbonate (LITHOBID) 300 MG CR tablet Take 300 mg by mouth at bedtime.     loratadine (CLARITIN) 10 MG tablet Take 10 mg by mouth in the morning.     Melatonin 5 MG TABS Take 2.5 mg by mouth at bedtime.     metFORMIN (GLUCOPHAGE-XR) 500 MG 24 hr tablet TAKE 1 TABLET EVERY DAY WITH BREAKFAST 90 tablet 3   Multiple Vitamin (MULTIVITAMIN WITH MINERALS) TABS tablet Take 1 tablet by mouth daily.      non-metallic deodorant (ALRA) MISC Apply 1 application topically daily as needed (for dryness).     ondansetron (ZOFRAN ODT) 4 MG disintegrating tablet Take 1 tablet (4 mg total) by mouth every 8 (eight) hours as needed for nausea or vomiting. 20 tablet 0   oxybutynin (DITROPAN) 5 MG tablet Take 1 tablet (5 mg total) by mouth daily. 90 tablet 1   Polyethyl Glycol-Propyl Glycol 0.4-0.3 % SOLN Place 1-2 drops into both eyes 3 (three) times daily as needed (for dry/irritated eyes.).     PRODIGY TWIST TOP LANCETS 28G MISC CHECK BLOOD SUGAR EVERY DAY 100 each 1   pyridoxine (B-6) 500 MG tablet Take 500 mg by mouth in the morning.     pyridOXINE (VITAMIN B-6) 100 MG tablet Take 100 mg by mouth at bedtime.     SYSTANE OVERNIGHT THERAPY 0.3 % GEL ophthalmic ointment Place 1 application into both eyes at bedtime.     Thiamine HCl (VITAMIN B-1) 250 MG tablet Take 250 mg by mouth at bedtime.     vitamin B-12 (CYANOCOBALAMIN) 1000 MCG tablet Take 1,000-3,000 mcg by mouth in the morning.      albuterol (VENTOLIN HFA) 108 (90 Base) MCG/ACT inhaler Inhale 2 puffs into the lungs every 6 (six) hours as needed for wheezing or shortness of breath. (Patient not taking: Reported on 08/22/2021) 8 g 0   hydrocortisone (ANUSOL-HC) 2.5 % rectal cream USE 1 APPLICATION RECTALLY TWICE DAILY (Patient not taking: Reported on 08/22/2021) 30 g 11   No current facility-administered medications for this encounter.    Physical Findings: The patient is in no acute distress. Patient is alert and oriented.  height is 5' 4.5" (1.638 m) and weight is 97 lb 9.6 oz (44.3  kg). Her temperature is 97.7 F (36.5 C). Her blood pressure is 141/70 (abnormal) and her pulse is 64. Her respiration is 20 and oxygen saturation is 99%. .   Lungs are clear to auscultation bilaterally. Heart has regular rate and rhythm. No palpable cervical, supraclavicular, or axillary adenopathy. Abdomen soft, non-tender, normal bowel sounds.  Left Breast: no palpable mass, nipple discharge or bleeding.  The right breast shows a lumpectomy scar in the lower outer quadrant.  No palpable or visible signs of recurrence.  No nipple discharge or bleeding.  Pacemaker present in the left upper chest region.    Lab Findings: Lab Results  Component Value Date   WBC 11.5 (H) 07/25/2021   HGB 14.5 07/25/2021   HCT 44.2 07/25/2021   MCV 96.2 07/25/2021   PLT 160.0 07/25/2021    Radiographic Findings: DG Ribs Unilateral W/Chest Right  Result Date: 07/25/2021 CLINICAL DATA:  RIGHT rib pain post fall for 3-4 weeks, history atrial fibrillation, breast cancer, COPD, diabetes mellitus, hypertension, smoker, pacemaker EXAM: RIGHT RIBS AND CHEST - 3+ VIEW COMPARISON:  Chest radiographs 09/27/2020 FINDINGS: LEFT subclavian sequential transvenous pacemaker leads project at RIGHT atrium and RIGHT ventricle. Normal heart size, mediastinal contours, and pulmonary vascularity. Atherosclerotic calcification aorta. Emphysematous and bronchitic changes  consistent with COPD. No pulmonary infiltrate, pleural effusion, or pneumothorax. Bones demineralized. BB placed at site of symptoms lower RIGHT ribs. No rib fracture or bone destruction. IMPRESSION: No acute osseous abnormalities. COPD changes without infiltrate. Aortic Atherosclerosis (ICD10-I70.0) and Emphysema (ICD10-J43.9). Electronically Signed   By: Lavonia Dana M.D.   On: 07/25/2021 10:31   CT Chest Wo Contrast  Result Date: 08/10/2021 CLINICAL DATA:  Chest pain.  [place] backwards 3 weeks ago. EXAM: CT CHEST WITHOUT CONTRAST TECHNIQUE: Multidetector CT imaging of the chest was performed following the standard protocol without IV contrast. COMPARISON:  CT chest abdomen and pelvis 07/28/2017. FINDINGS: Cardiovascular: The heart and aorta are normal in size. There are atherosclerotic calcifications of the aorta and coronary arteries. Left-sided pacemaker is present. There is no pericardial effusion. Mediastinum/Nodes: There are no enlarged mediastinal or hilar lymph nodes. There are calcified precarinal, subcarinal and right hilar lymph nodes. The visualized thyroid gland and esophagus are within normal limits. Lungs/Pleura: Mild emphysematous changes are similar to the prior study. There are scattered calcified granulomas. There is a stable 3 mm nodule in the right lower lobe image 3/109 unchanged from 2018 compatible with benign etiology. There is a new ground-glass nodule in the left upper lobe measuring 6 mm image 3/45. There are new pleural based and parenchymal nodular densities peripherally throughout the right lower lobe. The largest area measures 1.9 x 0.7 x 2.2 cm image 3/73. No pleural effusion or pneumothorax. Upper Abdomen: No acute abnormality. Musculoskeletal: Degenerative changes affect the spine. No acute fractures are seen. IMPRESSION: 1. New right lower lobe pleural-based nodular densities with some additional peripheral right lower lobe peripheral nodular densities. The largest area measures  1.9 x 0.7 x 2.2 cm. Malignancy is not excluded. Consider one of the following in 3 months for both low-risk and high-risk individuals: (a) repeat chest CT, (b) follow-up PET-CT, or (c) tissue sampling. This recommendation follows the consensus statement: Guidelines for Management of Incidental Pulmonary Nodules Detected on CT Images: From the Fleischner Society 2017; Radiology 2017; 284:228-243. 2. New left upper lobe ground-glass nodule measuring 6 mm. Initial follow-up with CT at 6-12 months is recommended to confirm persistence. If persistent, repeat CT is recommended every 2 years until 5  years of stability has been established. This recommendation follows the consensus statement: Guidelines for Management of Incidental Pulmonary Nodules Detected on CT Images: From the Fleischner Society 2017; Radiology 2017; 284:228-243. 3. Stable emphysema. 4. Old granulomatous disease. Electronically Signed   By: Ronney Asters M.D.   On: 08/10/2021 20:49   CUP PACEART REMOTE DEVICE CHECK  Result Date: 08/12/2021 Scheduled remote reviewed. Normal device function.  Next remote 91 days. LH   Impression: High-grade, DCIS of right breast, ER/PR (neg) Pathological stage: pTis, pNX    No evidence of recurrence on exam today.  Recent mammogram also shows no evidence of recurrence or new problem in either breast.  Plan: Routine follow-up in 6 months.  As above she will see Dr. Valeta [name] later this month for evaluation of her pulmonary nodules.   20 minutes of total time was spent for this patient encounter, including preparation, face-to-face counseling with the patient and coordination of care, physical exam, and documentation of the encounter. ____________________________________  Blair Promise, PhD, MD  This document serves as a record of services personally performed by Gery Pray, MD. It was created on his behalf by Roney Mans, a trained medical scribe. The creation of this record is based on the scribe's  personal observations and the provider's statements to them. This document has been checked and approved by the attending provider.

## 2021-08-31 ENCOUNTER — Other Ambulatory Visit: Payer: Self-pay | Admitting: Internal Medicine

## 2021-09-01 ENCOUNTER — Encounter: Payer: Self-pay | Admitting: Internal Medicine

## 2021-09-01 MED ORDER — TRAMADOL HCL 50 MG PO TABS: 50.0000 mg | ORAL_TABLET | Freq: Three times a day (TID) | ORAL | 0 refills | Status: AC | PRN

## 2021-09-02 ENCOUNTER — Other Ambulatory Visit: Payer: Self-pay

## 2021-09-02 ENCOUNTER — Ambulatory Visit: Payer: Medicare HMO | Admitting: Pulmonary Disease

## 2021-09-02 ENCOUNTER — Encounter: Payer: Self-pay | Admitting: Pulmonary Disease

## 2021-09-02 VITALS — BP 134/84 | HR 72 | Temp 97.8°F | Ht 64.0 in | Wt 95.2 lb

## 2021-09-02 DIAGNOSIS — R911 Solitary pulmonary nodule: Secondary | ICD-10-CM

## 2021-09-02 DIAGNOSIS — Z720 Tobacco use: Secondary | ICD-10-CM | POA: Diagnosis not present

## 2021-09-02 DIAGNOSIS — R918 Other nonspecific abnormal finding of lung field: Secondary | ICD-10-CM | POA: Diagnosis not present

## 2021-09-02 DIAGNOSIS — R222 Localized swelling, mass and lump, trunk: Secondary | ICD-10-CM | POA: Diagnosis not present

## 2021-09-02 DIAGNOSIS — F17210 Nicotine dependence, cigarettes, uncomplicated: Secondary | ICD-10-CM

## 2021-09-02 DIAGNOSIS — J449 Chronic obstructive pulmonary disease, unspecified: Secondary | ICD-10-CM | POA: Diagnosis not present

## 2021-09-02 NOTE — Patient Instructions (Addendum)
Thank you for visiting Dr. Valeta Harms at [facility]. Today we recommend the following: Orders Placed This Encounter  Procedures   NM PET Image Initial (PI) Skull Base To Thigh (F-18 FDG)   See me after PET scan complete.   Return in about 13 days (around 09/15/2021) for w/ Dr. Valeta Harms .    Please do your part to reduce the spread of COVID-19.
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
## 2021-09-05 ENCOUNTER — Encounter: Payer: Self-pay | Admitting: Internal Medicine

## 2021-09-05 ENCOUNTER — Ambulatory Visit (INDEPENDENT_AMBULATORY_CARE_PROVIDER_SITE_OTHER): Payer: Medicare HMO | Admitting: Internal Medicine

## 2021-09-05 ENCOUNTER — Other Ambulatory Visit: Payer: Self-pay

## 2021-09-05 VITALS — BP 120/70 | HR 82 | Temp 97.7°F | Ht 64.0 in | Wt 98.0 lb

## 2021-09-05 DIAGNOSIS — R0781 Pleurodynia: Secondary | ICD-10-CM

## 2021-09-05 DIAGNOSIS — E119 Type 2 diabetes mellitus without complications: Secondary | ICD-10-CM | POA: Diagnosis not present

## 2021-09-05 DIAGNOSIS — E1169 Type 2 diabetes mellitus with other specified complication: Secondary | ICD-10-CM | POA: Diagnosis not present

## 2021-09-05 DIAGNOSIS — E063 Autoimmune thyroiditis: Secondary | ICD-10-CM | POA: Diagnosis not present

## 2021-09-05 DIAGNOSIS — S300XXA Contusion of lower back and pelvis, initial encounter: Secondary | ICD-10-CM

## 2021-09-05 DIAGNOSIS — R630 Anorexia: Secondary | ICD-10-CM

## 2021-09-05 DIAGNOSIS — R350 Frequency of micturition: Secondary | ICD-10-CM | POA: Diagnosis not present

## 2021-09-05 DIAGNOSIS — F3131 Bipolar disorder, current episode depressed, mild: Secondary | ICD-10-CM | POA: Diagnosis not present

## 2021-09-05 MED ORDER — CIPROFLOXACIN HCL 250 MG PO TABS
250.0000 mg | ORAL_TABLET | Freq: Two times a day (BID) | ORAL | 0 refills | Status: DC
Start: 2021-09-05 — End: 2021-09-10

## 2021-09-05 NOTE — Assessment & Plan Note (Signed)
Chronic Following with psychiatry and psychology New referral for psychology-already established and needs for insurance purposes

## 2021-09-05 NOTE — Assessment & Plan Note (Signed)
Chronic Managed by endocrine Unlikely that any of her symptoms are currently related to change in thyroid, but will check TSH
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
## 2021-09-05 NOTE — Patient Instructions (Addendum)
° ° °  Blood work was ordered.     Medications changes include :   cipro 250 mg twice daily for 3 days  Your prescription(s) have been submitted to your pharmacy. Please take as directed and contact our office if you believe you are having problem(s) with the medication(s).

## 2021-09-05 NOTE — Assessment & Plan Note (Signed)
Acute Fell last night and has a large hematoma of the right buttock She is on Pradaxa-I do not think we need to stop we will hold this at this time Her daughter will monitor the size of the hematoma Advised this will take weeks to resolve Can apply heat if uncomfortable

## 2021-09-05 NOTE — Assessment & Plan Note (Signed)
Acute Has had some urinary frequency She is a poor historian secondary to dementia so with unknown how long her symptoms have been going on No pain with urination.  Possible dark urine, which may be related to dehydration possible infection Check urinalysis urine culture Will send in Cipro 250 mg twice daily x3 days, but we will wait for the results of the urinalysis before starting the symptoms change or she is not able to give a urine sample

## 2021-09-05 NOTE — Assessment & Plan Note (Addendum)
Chronic With elevated sugars recently - ? Related to infection No change in meds, diet Check a1c Will hold off on adjusting medication since likely from an infection Continue metformin 500 mg XR daily with breakfast Continue to monitor sugars closely

## 2021-09-05 NOTE — Assessment & Plan Note (Signed)
Subacute Likely related to pleural mass at that level Scheduled for PET scan Continue tramadol 25-50 mg 3 times daily as needed this is causing some constipation and just started MiraLAX today

## 2021-09-06 ENCOUNTER — Encounter: Payer: Self-pay | Admitting: Internal Medicine

## 2021-09-06 LAB — URINALYSIS, ROUTINE W REFLEX MICROSCOPIC
Bilirubin Urine: NEGATIVE
Hgb urine dipstick: NEGATIVE
Ketones, ur: NEGATIVE
Leukocytes,Ua: NEGATIVE
Nitrite: NEGATIVE
RBC / HPF: NONE SEEN (ref 0–?)
Specific Gravity, Urine: <=1.005 — AB (ref 1.000–1.030)
Total Protein, Urine: NEGATIVE
Urine Glucose: NEGATIVE
Urobilinogen, UA: 0.2 (ref 0.0–1.0)
pH: 6 (ref 5.0–8.0)

## 2021-09-06 LAB — CBC WITH DIFFERENTIAL/PLATELET
Basophils Absolute: 0.1 10*3/uL (ref 0.0–0.1)
Basophils Relative: 0.6 % (ref 0.0–3.0)
Eosinophils Absolute: 0.3 10*3/uL (ref 0.0–0.7)
Eosinophils Relative: 1.9 % (ref 0.0–5.0)
HCT: 36.6 % (ref 36.0–46.0)
Hemoglobin: 12 g/dL (ref 12.0–15.0)
Lymphocytes Relative: 14.7 % (ref 12.0–46.0)
Lymphs Abs: 2.2 10*3/uL (ref 0.7–4.0)
MCHC: 32.7 g/dL (ref 30.0–36.0)
MCV: 96.2 fl (ref 78.0–100.0)
Monocytes Absolute: 1.2 10*3/uL — ABNORMAL HIGH (ref 0.1–1.0)
Monocytes Relative: 7.7 % (ref 3.0–12.0)
Neutro Abs: 11.5 10*3/uL — ABNORMAL HIGH (ref 1.4–7.7)
Neutrophils Relative %: 75.1 % (ref 43.0–77.0)
Platelets: 145 10*3/uL — ABNORMAL LOW (ref 150.0–400.0)
RBC: 3.8 Mil/uL — ABNORMAL LOW (ref 3.87–5.11)
RDW: 13 % (ref 11.5–15.5)
WBC: 15.3 10*3/uL — ABNORMAL HIGH (ref 4.0–10.5)

## 2021-09-06 LAB — COMPREHENSIVE METABOLIC PANEL
ALT: 20 U/L (ref 0–35)
AST: 27 U/L (ref 0–37)
Albumin: 4.3 g/dL (ref 3.5–5.2)
Alkaline Phosphatase: 44 U/L (ref 39–117)
BUN: 36 mg/dL — ABNORMAL HIGH (ref 6–23)
CO2: 30 mEq/L (ref 19–32)
Calcium: 10.7 mg/dL — ABNORMAL HIGH (ref 8.4–10.5)
Chloride: 94 mEq/L — ABNORMAL LOW (ref 96–112)
Creatinine, Ser: 1.1 mg/dL (ref 0.40–1.20)
GFR: 47.35 mL/min — ABNORMAL LOW (ref >60.00)
Glucose, Bld: 166 mg/dL — ABNORMAL HIGH (ref 70–99)
Potassium: 4.3 mEq/L (ref 3.5–5.1)
Sodium: 131 mEq/L — ABNORMAL LOW (ref 135–145)
Total Bilirubin: 0.4 mg/dL (ref 0.2–1.2)
Total Protein: 7 g/dL (ref 6.0–8.3)

## 2021-09-06 LAB — HEMOGLOBIN A1C: Hgb A1c MFr Bld: 6.2 % (ref 4.6–6.5)

## 2021-09-06 LAB — TSH: TSH: 3.51 u[IU]/mL (ref 0.35–5.50)

## 2021-09-07 ENCOUNTER — Ambulatory Visit (INDEPENDENT_AMBULATORY_CARE_PROVIDER_SITE_OTHER): Payer: Medicare HMO

## 2021-09-07 ENCOUNTER — Encounter: Payer: Self-pay | Admitting: Internal Medicine

## 2021-09-07 DIAGNOSIS — S2241XA Multiple fractures of ribs, right side, initial encounter for closed fracture: Secondary | ICD-10-CM | POA: Diagnosis not present

## 2021-09-07 DIAGNOSIS — R0781 Pleurodynia: Secondary | ICD-10-CM

## 2021-09-07 LAB — URINE CULTURE: Result:: NO GROWTH

## 2021-09-07 MED ORDER — TRAMADOL HCL 50 MG PO TABS: 25.0000 mg | ORAL_TABLET | Freq: Three times a day (TID) | ORAL | 0 refills | Status: DC | PRN

## 2021-09-08 ENCOUNTER — Emergency Department (HOSPITAL_COMMUNITY): Payer: Medicare HMO

## 2021-09-08 ENCOUNTER — Encounter: Payer: Self-pay | Admitting: Internal Medicine

## 2021-09-08 ENCOUNTER — Inpatient Hospital Stay (HOSPITAL_COMMUNITY)
Admission: EM | Admit: 2021-09-08 | Discharge: 2021-09-10 | DRG: 184 | Disposition: A | Discharge status: Home Health | Payer: Medicare HMO | Attending: Internal Medicine | Admitting: Internal Medicine

## 2021-09-08 DIAGNOSIS — R1011 Right upper quadrant pain: Secondary | ICD-10-CM | POA: Diagnosis not present

## 2021-09-08 DIAGNOSIS — Z66 Do not resuscitate: Secondary | ICD-10-CM | POA: Diagnosis not present

## 2021-09-08 DIAGNOSIS — Z7984 Long term (current) use of oral hypoglycemic drugs: Secondary | ICD-10-CM

## 2021-09-08 DIAGNOSIS — R112 Nausea with vomiting, unspecified: Secondary | ICD-10-CM | POA: Diagnosis present

## 2021-09-08 DIAGNOSIS — I5181 Takotsubo syndrome: Secondary | ICD-10-CM | POA: Diagnosis not present

## 2021-09-08 DIAGNOSIS — D62 Acute posthemorrhagic anemia: Secondary | ICD-10-CM | POA: Diagnosis present

## 2021-09-08 DIAGNOSIS — R54 Age-related physical debility: Secondary | ICD-10-CM | POA: Diagnosis present

## 2021-09-08 DIAGNOSIS — Z853 Personal history of malignant neoplasm of breast: Secondary | ICD-10-CM | POA: Diagnosis not present

## 2021-09-08 DIAGNOSIS — F319 Bipolar disorder, unspecified: Secondary | ICD-10-CM | POA: Diagnosis present

## 2021-09-08 DIAGNOSIS — I10 Essential (primary) hypertension: Secondary | ICD-10-CM | POA: Diagnosis present

## 2021-09-08 DIAGNOSIS — R0602 Shortness of breath: Secondary | ICD-10-CM | POA: Diagnosis not present

## 2021-09-08 DIAGNOSIS — I739 Peripheral vascular disease, unspecified: Secondary | ICD-10-CM | POA: Diagnosis not present

## 2021-09-08 DIAGNOSIS — Z20822 Contact with and (suspected) exposure to covid-19: Secondary | ICD-10-CM | POA: Diagnosis not present

## 2021-09-08 DIAGNOSIS — M47812 Spondylosis without myelopathy or radiculopathy, cervical region: Secondary | ICD-10-CM | POA: Diagnosis not present

## 2021-09-08 DIAGNOSIS — I480 Paroxysmal atrial fibrillation: Secondary | ICD-10-CM | POA: Diagnosis present

## 2021-09-08 DIAGNOSIS — T40605A Adverse effect of unspecified narcotics, initial encounter: Secondary | ICD-10-CM | POA: Diagnosis present

## 2021-09-08 DIAGNOSIS — W19XXXS Unspecified fall, sequela: Secondary | ICD-10-CM

## 2021-09-08 DIAGNOSIS — E063 Autoimmune thyroiditis: Secondary | ICD-10-CM | POA: Diagnosis present

## 2021-09-08 DIAGNOSIS — S2241XA Multiple fractures of ribs, right side, initial encounter for closed fracture: Secondary | ICD-10-CM | POA: Diagnosis not present

## 2021-09-08 DIAGNOSIS — I2510 Atherosclerotic heart disease of native coronary artery without angina pectoris: Secondary | ICD-10-CM

## 2021-09-08 DIAGNOSIS — R1110 Vomiting, unspecified: Secondary | ICD-10-CM | POA: Diagnosis present

## 2021-09-08 DIAGNOSIS — Z7189 Other specified counseling: Secondary | ICD-10-CM

## 2021-09-08 DIAGNOSIS — Z79899 Other long term (current) drug therapy: Secondary | ICD-10-CM

## 2021-09-08 DIAGNOSIS — S0990XA Unspecified injury of head, initial encounter: Secondary | ICD-10-CM | POA: Diagnosis not present

## 2021-09-08 DIAGNOSIS — Z79890 Hormone replacement therapy (postmenopausal): Secondary | ICD-10-CM

## 2021-09-08 DIAGNOSIS — S2239XA Fracture of one rib, unspecified side, initial encounter for closed fracture: Secondary | ICD-10-CM | POA: Diagnosis present

## 2021-09-08 DIAGNOSIS — R918 Other nonspecific abnormal finding of lung field: Secondary | ICD-10-CM | POA: Diagnosis present

## 2021-09-08 DIAGNOSIS — I442 Atrioventricular block, complete: Secondary | ICD-10-CM | POA: Diagnosis not present

## 2021-09-08 DIAGNOSIS — Z7901 Long term (current) use of anticoagulants: Secondary | ICD-10-CM

## 2021-09-08 DIAGNOSIS — K5900 Constipation, unspecified: Secondary | ICD-10-CM | POA: Diagnosis present

## 2021-09-08 DIAGNOSIS — U071 COVID-19: Secondary | ICD-10-CM

## 2021-09-08 DIAGNOSIS — Z880 Allergy status to penicillin: Secondary | ICD-10-CM

## 2021-09-08 DIAGNOSIS — F03A3 Unspecified dementia, mild, with mood disturbance: Secondary | ICD-10-CM | POA: Diagnosis not present

## 2021-09-08 DIAGNOSIS — R930 Abnormal findings on diagnostic imaging of skull and head, not elsewhere classified: Secondary | ICD-10-CM | POA: Diagnosis present

## 2021-09-08 DIAGNOSIS — D0511 Intraductal carcinoma in situ of right breast: Secondary | ICD-10-CM | POA: Diagnosis present

## 2021-09-08 DIAGNOSIS — I4891 Unspecified atrial fibrillation: Secondary | ICD-10-CM | POA: Diagnosis present

## 2021-09-08 DIAGNOSIS — D649 Anemia, unspecified: Secondary | ICD-10-CM | POA: Diagnosis not present

## 2021-09-08 DIAGNOSIS — Z833 Family history of diabetes mellitus: Secondary | ICD-10-CM

## 2021-09-08 DIAGNOSIS — W19XXXA Unspecified fall, initial encounter: Secondary | ICD-10-CM | POA: Diagnosis present

## 2021-09-08 DIAGNOSIS — Z8616 Personal history of COVID-19: Secondary | ICD-10-CM

## 2021-09-08 DIAGNOSIS — Z950 Presence of cardiac pacemaker: Secondary | ICD-10-CM | POA: Diagnosis present

## 2021-09-08 DIAGNOSIS — S2231XA Fracture of one rib, right side, initial encounter for closed fracture: Secondary | ICD-10-CM | POA: Diagnosis not present

## 2021-09-08 DIAGNOSIS — Z8249 Family history of ischemic heart disease and other diseases of the circulatory system: Secondary | ICD-10-CM

## 2021-09-08 DIAGNOSIS — S300XXA Contusion of lower back and pelvis, initial encounter: Secondary | ICD-10-CM | POA: Diagnosis present

## 2021-09-08 DIAGNOSIS — Z87891 Personal history of nicotine dependence: Secondary | ICD-10-CM

## 2021-09-08 DIAGNOSIS — J449 Chronic obstructive pulmonary disease, unspecified: Secondary | ICD-10-CM | POA: Diagnosis not present

## 2021-09-08 DIAGNOSIS — Z885 Allergy status to narcotic agent status: Secondary | ICD-10-CM

## 2021-09-08 DIAGNOSIS — W1830XA Fall on same level, unspecified, initial encounter: Secondary | ICD-10-CM | POA: Diagnosis present

## 2021-09-08 DIAGNOSIS — E119 Type 2 diabetes mellitus without complications: Secondary | ICD-10-CM | POA: Diagnosis present

## 2021-09-08 DIAGNOSIS — R0789 Other chest pain: Secondary | ICD-10-CM | POA: Diagnosis not present

## 2021-09-08 DIAGNOSIS — Z91048 Other nonmedicinal substance allergy status: Secondary | ICD-10-CM

## 2021-09-08 DIAGNOSIS — E785 Hyperlipidemia, unspecified: Secondary | ICD-10-CM | POA: Diagnosis present

## 2021-09-08 DIAGNOSIS — Z9861 Coronary angioplasty status: Secondary | ICD-10-CM

## 2021-09-08 DIAGNOSIS — Z923 Personal history of irradiation: Secondary | ICD-10-CM

## 2021-09-08 DIAGNOSIS — Z91040 Latex allergy status: Secondary | ICD-10-CM

## 2021-09-08 DIAGNOSIS — R296 Repeated falls: Secondary | ICD-10-CM | POA: Diagnosis present

## 2021-09-08 DIAGNOSIS — I700 Atherosclerosis of aorta: Secondary | ICD-10-CM | POA: Diagnosis not present

## 2021-09-08 DIAGNOSIS — F03A0 Unspecified dementia, mild, without behavioral disturbance, psychotic disturbance, mood disturbance, and anxiety: Secondary | ICD-10-CM | POA: Diagnosis present

## 2021-09-08 DIAGNOSIS — R1010 Upper abdominal pain, unspecified: Secondary | ICD-10-CM | POA: Diagnosis not present

## 2021-09-08 DIAGNOSIS — E210 Primary hyperparathyroidism: Secondary | ICD-10-CM | POA: Diagnosis present

## 2021-09-08 DIAGNOSIS — Z681 Body mass index (BMI) 19 or less, adult: Secondary | ICD-10-CM

## 2021-09-08 DIAGNOSIS — R079 Chest pain, unspecified: Secondary | ICD-10-CM | POA: Diagnosis not present

## 2021-09-08 DIAGNOSIS — R52 Pain, unspecified: Secondary | ICD-10-CM

## 2021-09-08 DIAGNOSIS — K5903 Drug induced constipation: Secondary | ICD-10-CM | POA: Diagnosis present

## 2021-09-08 DIAGNOSIS — T148XXA Other injury of unspecified body region, initial encounter: Secondary | ICD-10-CM

## 2021-09-08 DIAGNOSIS — Z888 Allergy status to other drugs, medicaments and biological substances status: Secondary | ICD-10-CM

## 2021-09-08 LAB — URINALYSIS, ROUTINE W REFLEX MICROSCOPIC
Bilirubin Urine: NEGATIVE
Glucose, UA: NEGATIVE mg/dL
Hgb urine dipstick: NEGATIVE
Ketones, ur: NEGATIVE mg/dL
Leukocytes,Ua: NEGATIVE
Nitrite: NEGATIVE
Protein, ur: NEGATIVE mg/dL
Specific Gravity, Urine: 1.02 (ref 1.005–1.030)
pH: 7 (ref 5.0–8.0)

## 2021-09-08 LAB — CBC WITH DIFFERENTIAL/PLATELET
Abs Immature Granulocytes: 0.06 10*3/uL (ref 0.00–0.07)
Basophils Absolute: 0 10*3/uL (ref 0.0–0.1)
Basophils Relative: 0 %
Eosinophils Absolute: 0 10*3/uL (ref 0.0–0.5)
Eosinophils Relative: 0 %
HCT: 32.6 % — ABNORMAL LOW (ref 36.0–46.0)
Hemoglobin: 10.8 g/dL — ABNORMAL LOW (ref 12.0–15.0)
Immature Granulocytes: 1 %
Lymphocytes Relative: 11 %
Lymphs Abs: 1.3 10*3/uL (ref 0.7–4.0)
MCH: 32.3 pg (ref 26.0–34.0)
MCHC: 33.1 g/dL (ref 30.0–36.0)
MCV: 97.6 fL (ref 80.0–100.0)
Monocytes Absolute: 0.7 10*3/uL (ref 0.1–1.0)
Monocytes Relative: 6 %
Neutro Abs: 9.5 10*3/uL — ABNORMAL HIGH (ref 1.7–7.7)
Neutrophils Relative %: 82 %
Platelets: 164 10*3/uL (ref 150–400)
RBC: 3.34 MIL/uL — ABNORMAL LOW (ref 3.87–5.11)
RDW: 12.4 % (ref 11.5–15.5)
WBC: 11.6 10*3/uL — ABNORMAL HIGH (ref 4.0–10.5)
nRBC: 0 % (ref 0.0–0.2)

## 2021-09-08 LAB — COMPREHENSIVE METABOLIC PANEL
ALT: 22 U/L (ref 0–44)
AST: 26 U/L (ref 15–41)
Albumin: 3.4 g/dL — ABNORMAL LOW (ref 3.5–5.0)
Alkaline Phosphatase: 42 U/L (ref 38–126)
Anion gap: 9 (ref 5–15)
BUN: 28 mg/dL — ABNORMAL HIGH (ref 8–23)
CO2: 27 mmol/L (ref 22–32)
Calcium: 10.7 mg/dL — ABNORMAL HIGH (ref 8.9–10.3)
Chloride: 101 mmol/L (ref 98–111)
Creatinine, Ser: 1 mg/dL (ref 0.44–1.00)
GFR, Estimated: 57 mL/min — ABNORMAL LOW (ref >60)
Glucose, Bld: 233 mg/dL — ABNORMAL HIGH (ref 70–99)
Potassium: 4.4 mmol/L (ref 3.5–5.1)
Sodium: 137 mmol/L (ref 135–145)
Total Bilirubin: 0.9 mg/dL (ref 0.3–1.2)
Total Protein: 6.2 g/dL — ABNORMAL LOW (ref 6.5–8.1)

## 2021-09-08 LAB — RESP PANEL BY RT-PCR (FLU A&B, COVID) ARPGX2
Influenza A by PCR: NEGATIVE
Influenza B by PCR: NEGATIVE
SARS Coronavirus 2 by RT PCR: NEGATIVE

## 2021-09-08 LAB — LITHIUM LEVEL: Lithium Lvl: 0.6 mmol/L (ref 0.60–1.20)

## 2021-09-08 LAB — CBG MONITORING, ED: Glucose-Capillary: 150 mg/dL — ABNORMAL HIGH (ref 70–99)

## 2021-09-08 MED ORDER — LITHIUM CARBONATE ER 300 MG PO TBCR
300.0000 mg | EXTENDED_RELEASE_TABLET | Freq: Every day | ORAL | Status: DC
  Administered 2021-09-08 – 2021-09-09 (×2): 300 mg via ORAL
  Filled 2021-09-08 (×3): qty 1

## 2021-09-08 MED ORDER — DICLOFENAC SODIUM 1 % EX GEL
1.0000 "application " | Freq: Four times a day (QID) | CUTANEOUS | Status: DC | PRN
  Filled 2021-09-08: qty 100

## 2021-09-08 MED ORDER — ONDANSETRON HCL 4 MG/2ML IJ SOLN
4.0000 mg | Freq: Four times a day (QID) | INTRAMUSCULAR | Status: DC | PRN
  Administered 2021-09-10: 4 mg via INTRAVENOUS
  Filled 2021-09-08: qty 2

## 2021-09-08 MED ORDER — LEVOTHYROXINE SODIUM 112 MCG PO TABS
112.0000 ug | ORAL_TABLET | ORAL | Status: DC
  Administered 2021-09-09: 112 ug via ORAL
  Filled 2021-09-08: qty 1

## 2021-09-08 MED ORDER — METHOCARBAMOL 1000 MG/10ML IJ SOLN
500.0000 mg | Freq: Four times a day (QID) | INTRAVENOUS | Status: DC | PRN
  Filled 2021-09-08: qty 5

## 2021-09-08 MED ORDER — ATORVASTATIN CALCIUM 80 MG PO TABS
80.0000 mg | ORAL_TABLET | ORAL | Status: DC
  Administered 2021-09-09: 80 mg via ORAL
  Filled 2021-09-08: qty 1

## 2021-09-08 MED ORDER — THIAMINE HCL 100 MG PO TABS
250.0000 mg | ORAL_TABLET | Freq: Every day | ORAL | Status: DC
  Administered 2021-09-08 – 2021-09-09 (×2): 250 mg via ORAL
  Filled 2021-09-08 (×2): qty 3

## 2021-09-08 MED ORDER — MORPHINE SULFATE (PF) 4 MG/ML IV SOLN
4.0000 mg | Freq: Once | INTRAVENOUS | Status: AC
  Administered 2021-09-08: 4 mg via INTRAVENOUS
  Filled 2021-09-08: qty 1

## 2021-09-08 MED ORDER — ENSURE MAX PROTEIN PO LIQD
237.0000 mL | Freq: Two times a day (BID) | ORAL | Status: DC
  Administered 2021-09-09 – 2021-09-10 (×3): 237 mL via ORAL
  Filled 2021-09-08 (×4): qty 330

## 2021-09-08 MED ORDER — LORATADINE 10 MG PO TABS
10.0000 mg | ORAL_TABLET | Freq: Every morning | ORAL | Status: DC
  Administered 2021-09-09 – 2021-09-10 (×2): 10 mg via ORAL
  Filled 2021-09-08 (×3): qty 1

## 2021-09-08 MED ORDER — LEVOTHYROXINE SODIUM 112 MCG PO TABS: 112.0000 ug | ORAL_TABLET | ORAL | Status: DC

## 2021-09-08 MED ORDER — ONDANSETRON HCL 4 MG/2ML IJ SOLN
INTRAMUSCULAR | Status: AC
  Administered 2021-09-08: 4 mg via INTRAVENOUS
  Filled 2021-09-08: qty 2

## 2021-09-08 MED ORDER — BISACODYL 10 MG RE SUPP: 10.0000 mg | Freq: Once | RECTAL | Status: DC

## 2021-09-08 MED ORDER — DABIGATRAN ETEXILATE MESYLATE 150 MG PO CAPS
150.0000 mg | ORAL_CAPSULE | Freq: Two times a day (BID) | ORAL | Status: DC
  Administered 2021-09-09: 150 mg via ORAL
  Filled 2021-09-08: qty 1

## 2021-09-08 MED ORDER — VITAMIN B-6 100 MG PO TABS
500.0000 mg | ORAL_TABLET | Freq: Every day | ORAL | Status: DC
  Administered 2021-09-09 – 2021-09-10 (×2): 500 mg via ORAL
  Filled 2021-09-08 (×2): qty 5

## 2021-09-08 MED ORDER — POLYETHYLENE GLYCOL 3350 17 G PO PACK
17.0000 g | PACK | Freq: Two times a day (BID) | ORAL | Status: DC
  Administered 2021-09-08 – 2021-09-09 (×3): 17 g via ORAL
  Filled 2021-09-08 (×3): qty 1

## 2021-09-08 MED ORDER — MELATONIN 5 MG PO TABS
5.0000 mg | ORAL_TABLET | Freq: Every day | ORAL | Status: DC
  Filled 2021-09-08: qty 1

## 2021-09-08 MED ORDER — SENNOSIDES-DOCUSATE SODIUM 8.6-50 MG PO TABS
2.0000 | ORAL_TABLET | Freq: Every evening | ORAL | Status: DC | PRN
  Administered 2021-09-08 – 2021-09-09 (×2): 2 via ORAL
  Filled 2021-09-08 (×2): qty 2

## 2021-09-08 MED ORDER — POLYVINYL ALCOHOL 1.4 % OP SOLN
1.0000 [drp] | Freq: Three times a day (TID) | OPHTHALMIC | Status: DC | PRN
  Filled 2021-09-08: qty 15

## 2021-09-08 MED ORDER — BISACODYL 10 MG RE SUPP
10.0000 mg | Freq: Every day | RECTAL | Status: DC | PRN
  Administered 2021-09-08 – 2021-09-09 (×3): 10 mg via RECTAL
  Filled 2021-09-08 (×2): qty 1

## 2021-09-08 MED ORDER — FLEET ENEMA 7-19 GM/118ML RE ENEM: 1.0000 | ENEMA | Freq: Once | RECTAL | Status: DC | PRN

## 2021-09-08 MED ORDER — LEVOTHYROXINE SODIUM 112 MCG PO TABS
168.0000 ug | ORAL_TABLET | ORAL | Status: DC
  Administered 2021-09-10: 168 ug via ORAL
  Filled 2021-09-08: qty 2

## 2021-09-08 MED ORDER — NALOXONE HCL 0.4 MG/ML IJ SOLN: 0.4000 mg | INTRAMUSCULAR | Status: DC | PRN

## 2021-09-08 MED ORDER — IOHEXOL 350 MG/ML SOLN
80.0000 mL | Freq: Once | INTRAVENOUS | Status: AC | PRN
  Administered 2021-09-08: 80 mL via INTRAVENOUS

## 2021-09-08 MED ORDER — ADULT MULTIVITAMIN W/MINERALS CH
1.0000 | ORAL_TABLET | Freq: Every morning | ORAL | Status: DC
  Administered 2021-09-09 – 2021-09-10 (×2): 1 via ORAL
  Filled 2021-09-08 (×2): qty 1

## 2021-09-08 MED ORDER — ACETAMINOPHEN 500 MG PO TABS
1000.0000 mg | ORAL_TABLET | Freq: Three times a day (TID) | ORAL | Status: DC
  Administered 2021-09-08 – 2021-09-10 (×5): 1000 mg via ORAL
  Filled 2021-09-08 (×5): qty 2

## 2021-09-08 MED ORDER — BUSPIRONE HCL 5 MG PO TABS
15.0000 mg | ORAL_TABLET | Freq: Every morning | ORAL | Status: DC
  Administered 2021-09-09 – 2021-09-10 (×2): 15 mg via ORAL
  Filled 2021-09-08 (×3): qty 3

## 2021-09-08 MED ORDER — ALBUTEROL SULFATE (2.5 MG/3ML) 0.083% IN NEBU: 2.5000 mg | INHALATION_SOLUTION | RESPIRATORY_TRACT | Status: DC | PRN

## 2021-09-08 MED ORDER — OXYBUTYNIN CHLORIDE 5 MG PO TABS
5.0000 mg | ORAL_TABLET | Freq: Two times a day (BID) | ORAL | Status: DC
  Administered 2021-09-08 – 2021-09-09 (×2): 5 mg via ORAL
  Filled 2021-09-08 (×3): qty 1

## 2021-09-08 MED ORDER — TRAMADOL HCL 50 MG PO TABS
50.0000 mg | ORAL_TABLET | Freq: Three times a day (TID) | ORAL | Status: DC | PRN
Start: 2021-09-08 — End: 2021-09-09
  Administered 2021-09-09: 50 mg via ORAL
  Filled 2021-09-08: qty 1

## 2021-09-08 MED ORDER — INSULIN ASPART 100 UNIT/ML IJ SOLN
0.0000 [IU] | Freq: Three times a day (TID) | INTRAMUSCULAR | Status: DC
  Administered 2021-09-09 (×2): 2 [IU] via SUBCUTANEOUS
  Administered 2021-09-10 (×2): 1 [IU] via SUBCUTANEOUS

## 2021-09-08 MED ORDER — FLUOXETINE HCL 20 MG PO CAPS
20.0000 mg | ORAL_CAPSULE | Freq: Every morning | ORAL | Status: DC
  Administered 2021-09-09 – 2021-09-10 (×2): 20 mg via ORAL
  Filled 2021-09-08 (×3): qty 1

## 2021-09-08 MED ORDER — KETOTIFEN FUMARATE 0.025 % OP SOLN
1.0000 [drp] | Freq: Every day | OPHTHALMIC | Status: DC | PRN
  Filled 2021-09-08: qty 5

## 2021-09-08 MED ORDER — DONEPEZIL HCL 10 MG PO TABS
10.0000 mg | ORAL_TABLET | Freq: Every day | ORAL | Status: DC
  Administered 2021-09-08 – 2021-09-09 (×2): 10 mg via ORAL
  Filled 2021-09-08 (×2): qty 1

## 2021-09-08 NOTE — Assessment & Plan Note (Addendum)
Per daughter-size of hematoma has already decreased over the past few days-resuming Pradaxa cautiously-follow closely in the outpatient setting.

## 2021-09-08 NOTE — Assessment & Plan Note (Signed)
Continue statin. 

## 2021-09-08 NOTE — ED Notes (Signed)
Holding soap sud enema until further notice by MD

## 2021-09-08 NOTE — ED Provider Triage Note (Signed)
Emergency Medicine Provider Triage Evaluation Note  Stacy [name] , a 81 y.o. female  was evaluated in triage.  Pt complains of shaking and diaphoresis after she fell twice, once on Sunday and once on Monday.  On a pacemaker and has a history of stroke.  Currently takes Pradaxa.  Ambulates with walker at home, but ability has decreased since falls.  Chest x-ray on Wednesday confirmed 2 broken ribs on the right side of her chest.  Has PET scan this Monday to assess new right lung nodule.  Was on Cipro for elevated WBC thought to be due to UTI, but UA came back negative so was stopped.  Denies loss of consciousness, denies hitting her head but accompanying daughter is unsure.  Review of Systems  Positive: Diaphoresis, shaking, decreased activity, right chest pain, constipation Negative: Nausea, vomiting, diarrhea  Physical Exam  BP (!) 143/72 (BP Location: Left Arm)    Pulse 81    Temp 97.9 F (36.6 C) (Oral)    Resp 16    SpO2 95%  Gen:   Awake, no distress   Resp:  Normal effort  MSK:   Moves extremities without difficulty  Other:  TTP of the right chest notably over the ribs.  Diaphoretic.  Tremor present in all four extremities.  Ecchymosis present on the right mid back.  Medical Decision Making  Medically screening exam initiated at 11:00 AM.  Appropriate orders placed.  Stacy [name] was informed that the remainder of the evaluation will be completed by another provider, this initial triage assessment does not replace that evaluation, and the importance of remaining in the ED until their evaluation is complete.  Labs and images and EKG ordered   Stacy [name], Stacy [name] [birth year] 1122

## 2021-09-08 NOTE — Progress Notes (Signed)
Patient ID: Stacy [name], female   DOB: [birth year], 81 y.o.   MRN: [id number] Head CT reviewed. There is no need for followup. Not clear this is a hemorrhage. Only on once slice.

## 2021-09-08 NOTE — Assessment & Plan Note (Addendum)
Recently diagnosed with a right lung mass-is scheduled for PET scan next week.  She has a history of smoking-and has a history of breast cancer.  Followed by Dr. York Spaniel.

## 2021-09-08 NOTE — Assessment & Plan Note (Signed)
Per patient's daughter-patient was diagnosed with COVID in early [place] completed a 5-day course of Molnuparivir.  She currently does not have any symptoms.

## 2021-09-08 NOTE — ED Triage Notes (Addendum)
Daughter stated, mom has had a lot of shaking and very diaphoretic and having rt. Side pain. Stacy [name] after a fall and broke 2 ribs  9th and 10th rib.  Dr. Lenon Ahmadi, she has a elevated white count but not sure were it comes from

## 2021-09-08 NOTE — Assessment & Plan Note (Signed)
Continue levothyroxine 

## 2021-09-08 NOTE — Assessment & Plan Note (Addendum)
Likely due to narcotics-given 1 dose of Dulcolax suppository with good result-having BMs now with MiraLAX/senna combination.

## 2021-09-08 NOTE — Assessment & Plan Note (Addendum)
History of lumpectomy and radiation in 2019.  Per outpatient radiation oncology note-recent mammography on December 2022 demonstrated no evidence of malignancy.  Follows with radiation oncology-Dr. Dwana Curd.

## 2021-09-08 NOTE — Assessment & Plan Note (Addendum)
Per daughter-has vascular dementia-appears to be mild

## 2021-09-08 NOTE — Assessment & Plan Note (Addendum)
Had 2 episodes of mechanical fall on Sunday and Monday with resultant right gluteal hematoma and fracture of right10th rib.  Evaluated by PT-recommendations were for CIR-however per CIR-patient does not demonstrate medical necessity for CIR admit.  Daughter is at bedside-not keen on going to SNF-they will provide 24/7 care.  Will discharge home with home health services at the request of family.

## 2021-09-08 NOTE — ED Notes (Signed)
Patient nauseated after getting morphine, MD notified, patient medicated as ordered.

## 2021-09-08 NOTE — Assessment & Plan Note (Addendum)
Continue lithium-levels were stable on admission.

## 2021-09-08 NOTE — ED Notes (Signed)
Admitting MD at bedside will finish enema when he is done

## 2021-09-08 NOTE — Assessment & Plan Note (Addendum)
Maintaining sinus rhythm-monitor on telemetry.  She has had frequent falls recently-had a fall around Thanksgiving of last year as well.  Long discussion with 2 daughters at bedside-risk/benefits of anticoagulation discussed-for now since family able to provide 24/7 care-anticoagulation being cautiously continued.  Family understands catastrophic fall risk.  They will discuss further with the cardiologist at the next appointment.
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
## 2021-09-08 NOTE — Assessment & Plan Note (Signed)
ED MD discussed abnormal CT head findings with neurosurgeon-Dr. Cabbell-thought to be artifactual and not thought to have SAH/SDH.

## 2021-09-08 NOTE — Assessment & Plan Note (Addendum)
Likely sustained during her falls either on Sunday or Monday.  Plans are for supportive care-encourage incentive spirometry.  She is opiate nave-and gets drowsy even with tramadol.  Managed with Tylenol, Lidoderm patches-and decrease dose of tramadol 25 mg as needed.

## 2021-09-08 NOTE — ED Notes (Signed)
Patient transported to CT 

## 2021-09-08 NOTE — Assessment & Plan Note (Addendum)
CBG stable with SSI-resume metformin on discharge.   Recent Labs    09/09/21 1651 09/09/21 2055 09/10/21 0729  GLUCAP 181* 152* 136*

## 2021-09-08 NOTE — ED Notes (Signed)
Patient has had multiple falls this past week. Has a head bleed and is awaiting admission. Daughter is at bedside.

## 2021-09-08 NOTE — H&P (Addendum)
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  ASSESSMENT AND PLAN: * Fall- (present on admission) Had 2 episodes of mechanical fall on Sunday and Monday with resultant right gluteal hematoma and right ninth and 10th ribs.  Plans are for supportive care-tramadol for pain control-we will need PT OT evaluation.  Will need to check orthostatic vital signs  Vomiting- (present on admission) Benign abdominal exam-could be from constipation.  Being placed on a bowel regimen-no episodes of vomiting here in the ED.  Plans are to follow closely and use antiemetics as needed  Abnormal CT scan of head- (present on admission) ED MD discussed abnormal CT head findings with neurosurgeon-Dr. Cabbell-thought to be artifactual and not thought to have SAH/SDH.  Closed rib fracture- (present on admission) Likely sustained during her falls either on Sunday or Monday.  Plans are for  supportive care-encourage incentive spirometry.  Using tramadol for pain.  Traumatic hematoma of buttock- (present on admission) Per daughter-size of hematoma has already decreased over the past few days-we will resume Pradaxa starting tomorrow.  Follow clinically.  Acute  posthemorrhagic anemia- (present on admission) Mild drop in hemoglobin-likely due to blood loss from hematoma in the right gluteal area.  This is already at least 78-24 days old-Per daughter size of hematoma is already decreasing.  Cautiously continuing with Pradaxa starting tomorrow-follow serial CBC  Constipation- (present on admission) Likely due to narcotics-per daughter-patient has been ever since she got diagnosed with a right lung mass-she apparently has pleuritic chest pain.  Starting scheduled MiraLAX/senna-we will ask nurse to give 1 dose of Dulcolax suppository today-if she continues to have issues with constipation with these measures-she will need to be started on Movantik.  CAD S/P percutaneous coronary angioplasty No chest pain or shortness of breath-she has a remote history of a PCI.  Suspect she is not on any antiplatelet agents as she is on anticoagulation.  Paroxysmal atrial fibrillation- (present on admission) Maintaining sinus rhythm-monitor on telemetry.  She has had frequent falls recently-had a fall around Thanksgiving of last year as well.  We will have PT/OT assess her and see how she does.  For now cautiously continue with anticoagulation.  History of complete heart block-s/p Pacemaker-Medtronic-dual-chamber implantation in 2016- (present on admission) Monitor on telemetry.  DM-2 (A1c 6.2 on 09/05/2021) Hold metformin-monitor CBGs on SSI.  Hyperlipidemia- (present on admission) Continue statin  2 cm pleural-based density in the right lower lobe on recent CT chest on 08/12/2021- (present on admission) Recently diagnosed with a right lung mass-is scheduled for PET scan next week.  She has a history of  smoking-and has a history of breast cancer.  Followed by Dr. York Spaniel.  Bipolar disorder ([place])- (present on admission) Continue lithium-we will check levels.  Hypothyroidism, acquired, autoimmune- (present on admission) Continue levothyroxine  Ductal carcinoma in situ (DCIS) of right breast- (present on admission) History of lumpectomy and radiation in 2019.  Per outpatient radiation oncology note-recent mammography on December 2022 demonstrated no evidence of malignancy.  Follows with radiation oncology-Dr. Dwana Curd.  Mild dementia ([place])- (present on admission) Per daughter-has vascular dementia-appears to be mild-maintain delirium precautions for  Recent COVID-19 virus infection Per patient's daughter-patient was diagnosed with COVID in early January-and completed a 5-day course of Molnuparivir.  She currently does not have any symptoms.  Further plan will depend as patient's clinical course evolves and further radiologic and laboratory data become available. Patient will be monitored closely.  Above noted plan was discussed with patient/daughter face to face at bedside, they were in agreement.   CONSULTS: None   DVT Prophylaxis: Pradaxa  Code Status: Full Code  Disposition Plan:  Discharge back home vs SNF possibly in 1-2 days, depending on clinical course  Admission status: Observation going to tele  Total time spent  55 minutes.Greater than 50% of this time was spent in counseling, explanation of diagnosis, planning of further management, and coordination of care.  Severity of illness: The appropriate patient status for this patient is OBSERVATION. Observation status is judged to be reasonable and necessary in order to provide the required intensity of service to ensure the patient's safety. The patient's presenting symptoms, physical exam findings, and initial radiographic and laboratory data in the context of their medical condition is felt to place them at  decreased risk for further clinical deterioration. Furthermore, it is anticipated that the patient will be medically stable for discharge from the hospital within 2 midnights of admission. The following factors support the patient status of observation.   " The patient's presenting symptoms include fall. " The physical exam findings include right gluteal hematoma. "  The initial radiographic and laboratory data are right ninth/10th rib fracture, slight drop in hemoglobin.   Oren Binet [facility] Pager [phone]  If 7PM-7AM, please contact night-coverage  Please page via [email or web address]  Go to [email or web address] and use [place]'s universal password to access. If you do not have the password, please contact the hospital operator.  Locate the [facility] provider you are looking for under [facility] and page to a number that you can be directly reached. If you still have difficulty reaching the provider, please page the [facility] (Director on Call) for the Hospitalists listed on amion for assistance.  09/08/2021, 6:33 PM

## 2021-09-08 NOTE — Assessment & Plan Note (Signed)
No chest pain or shortness of breath-she has a remote history of a PCI.  Suspect she is not on any antiplatelet agents as she is on anticoagulation.

## 2021-09-08 NOTE — Assessment & Plan Note (Addendum)
Resolved-either from constipation or from narcotics that she received in ED.  Benign abdominal exam.

## 2021-09-08 NOTE — Assessment & Plan Note (Addendum)
Mild drop in hemoglobin-likely due to blood loss from hematoma in the right gluteal area.  This is already at least 5-25 days old-Per daughter size of hematoma is already decreasing.  Cautiously continuing with Pradaxa starting tomorrow-follow CBC periodically in the outpatient setting

## 2021-09-08 NOTE — ED Notes (Signed)
Abdominal pain no BM for a week, rib fx with entire pelvis area and buttock and to the left lateral lower leg with hematoma from home, fall from Sunday twice and Monday

## 2021-09-08 NOTE — Assessment & Plan Note (Addendum)
Monitored on telemetry.

## 2021-09-09 DIAGNOSIS — I4891 Unspecified atrial fibrillation: Secondary | ICD-10-CM | POA: Diagnosis not present

## 2021-09-09 DIAGNOSIS — R930 Abnormal findings on diagnostic imaging of skull and head, not elsewhere classified: Secondary | ICD-10-CM | POA: Diagnosis not present

## 2021-09-09 DIAGNOSIS — D62 Acute posthemorrhagic anemia: Secondary | ICD-10-CM | POA: Diagnosis not present

## 2021-09-09 DIAGNOSIS — S2231XA Fracture of one rib, right side, initial encounter for closed fracture: Secondary | ICD-10-CM

## 2021-09-09 DIAGNOSIS — W19XXXS Unspecified fall, sequela: Secondary | ICD-10-CM | POA: Diagnosis not present

## 2021-09-09 DIAGNOSIS — D649 Anemia, unspecified: Secondary | ICD-10-CM | POA: Diagnosis not present

## 2021-09-09 LAB — CBC
HCT: 30.7 % — ABNORMAL LOW (ref 36.0–46.0)
Hemoglobin: 9.9 g/dL — ABNORMAL LOW (ref 12.0–15.0)
MCH: 31.5 pg (ref 26.0–34.0)
MCHC: 32.2 g/dL (ref 30.0–36.0)
MCV: 97.8 fL (ref 80.0–100.0)
Platelets: 168 10*3/uL (ref 150–400)
RBC: 3.14 MIL/uL — ABNORMAL LOW (ref 3.87–5.11)
RDW: 12.6 % (ref 11.5–15.5)
WBC: 12 10*3/uL — ABNORMAL HIGH (ref 4.0–10.5)
nRBC: 0 % (ref 0.0–0.2)

## 2021-09-09 LAB — COMPREHENSIVE METABOLIC PANEL
ALT: 19 U/L (ref 0–44)
AST: 22 U/L (ref 15–41)
Albumin: 3.1 g/dL — ABNORMAL LOW (ref 3.5–5.0)
Alkaline Phosphatase: 38 U/L (ref 38–126)
Anion gap: 7 (ref 5–15)
BUN: 25 mg/dL — ABNORMAL HIGH (ref 8–23)
CO2: 27 mmol/L (ref 22–32)
Calcium: 10.3 mg/dL (ref 8.9–10.3)
Chloride: 102 mmol/L (ref 98–111)
Creatinine, Ser: 1.1 mg/dL — ABNORMAL HIGH (ref 0.44–1.00)
GFR, Estimated: 51 mL/min — ABNORMAL LOW (ref >60)
Glucose, Bld: 198 mg/dL — ABNORMAL HIGH (ref 70–99)
Potassium: 4.6 mmol/L (ref 3.5–5.1)
Sodium: 136 mmol/L (ref 135–145)
Total Bilirubin: 0.8 mg/dL (ref 0.3–1.2)
Total Protein: 5.8 g/dL — ABNORMAL LOW (ref 6.5–8.1)

## 2021-09-09 LAB — GLUCOSE, CAPILLARY
Glucose-Capillary: 152 mg/dL — ABNORMAL HIGH (ref 70–99)
Glucose-Capillary: 118 mg/dL — ABNORMAL HIGH (ref 70–99)
Glucose-Capillary: 181 mg/dL — ABNORMAL HIGH (ref 70–99)
Glucose-Capillary: 152 mg/dL — ABNORMAL HIGH (ref 70–99)

## 2021-09-09 MED ORDER — TRAMADOL HCL 50 MG PO TABS
25.0000 mg | ORAL_TABLET | Freq: Three times a day (TID) | ORAL | Status: DC | PRN
  Administered 2021-09-10: 25 mg via ORAL
  Filled 2021-09-09: qty 1

## 2021-09-09 MED ORDER — DICLOFENAC SODIUM 1 % EX GEL
1.0000 "application " | Freq: Four times a day (QID) | CUTANEOUS | Status: DC | PRN
  Administered 2021-09-09 – 2021-09-10 (×3): 1 via TOPICAL
  Filled 2021-09-09: qty 100

## 2021-09-09 MED ORDER — LIDOCAINE 5 % EX PTCH
2.0000 | MEDICATED_PATCH | CUTANEOUS | Status: DC
  Administered 2021-09-09 – 2021-09-10 (×2): 2 via TRANSDERMAL
  Filled 2021-09-09 (×2): qty 2

## 2021-09-09 MED ORDER — DABIGATRAN ETEXILATE MESYLATE 75 MG PO CAPS
75.0000 mg | ORAL_CAPSULE | Freq: Two times a day (BID) | ORAL | Status: DC
  Administered 2021-09-09 – 2021-09-10 (×2): 75 mg via ORAL
  Filled 2021-09-09 (×3): qty 1

## 2021-09-09 NOTE — Progress Notes (Addendum)
°  Inpatient Rehab Admissions Coordinator :  Per  [facility] recommendations patient was screened for CIR candidacy by Danne Baxter RN MSN. Patient does not appear to demonstrate the medical neccesity for a [place] /CIR admit.  She is under observations status. I will not place a Rehab Consult. Recommend other Rehab Venues to be pursued. Please contact me with any questions.  Danne Baxter RN MSN Admissions Coordinator [phone]

## 2021-09-09 NOTE — TOC Initial Note (Signed)
Transition of Care [facility]) - Initial/Assessment Note    Patient Details  Name: Stacy [name] MRN: [id number] Date of Birth: 1941/08/01  Transition of Care [facility]) CM/SW Contact:    Stacy Meeker, RN Phone Number: 09/09/2021, 4:32 PM  Clinical Narrative:   Patient is a recently diagnosed with a right lower lobe lung mass-presented to the ED with 2 episodes of recent fall-and associated right gluteal hematoma/right ninth and 10th rib fracture-along with worsening pain/vomiting/constipation and mild acute blood loss anemia.  Case manager spoke with patient's daughter, Stacy [name] [phone] to discuss [place] and DME needs. Provided Medicare.gov information. Referral for Home Health PT/OT/Aide called rto Stacy [name], [place] Liaison. DME  will be delivered to patient room. Stacy [name] wants her mom evaluated for CIR and states that if not approved she will go home with [facility]. No further needs identified at this time, [facility] team will continue to monitor.      Expected Discharge Plan: [place] Barriers to Discharge: Continued Medical Work up   Patient Goals and CMS Choice   CMS Medicare.gov Compare Post Acute Care list provided to:: Patient Represenative (must comment) Choice offered to / list presented to : Adult Children  Expected Discharge Plan and Services Expected Discharge Plan: [place]   Discharge Planning Services: CM Consult Post Acute Care Choice: Home Health, Durable Medical Equipment Living arrangements for the past 2 months: Single Family Home                 DME Arranged: [place] DME Agency: [employer] Date DME Agency Contacted: 09/09/21 Time DME Agency Contacted: [phone] Representative spoke with at DME Agency: [place]: PT, OT, Nurse's Aide [place] Agency: [place] Date [place]: 09/09/21 Time [place]: 1632 Representative spoke with at [place]  Arrangements/Services Living arrangements for the past 2 months: [place] Lives with:: Adult Children Patient language and need for interpreter reviewed:: Yes Do you feel safe going back to the place where you live?: Yes      Need for Family Participation in Patient Care: Yes (Comment) Care giver support system in place?: Yes (comment)   Criminal Activity/Legal Involvement Pertinent to Current Situation/Hospitalization: No - Comment as needed  Activities of Daily Living      Permission Sought/Granted                  Emotional Assessment         Alcohol / Substance Use: Not Applicable Psych Involvement: No (comment)  Admission diagnosis:  Hematoma [T14.8XXA] Pain [R52] Fall [W19.XXXA] Constipation, unspecified constipation type [K59.00] Anemia, unspecified type [D64.9] Patient Active Problem List   Diagnosis Date Noted   Fall 09/08/2021   Acute posthemorrhagic anemia 09/08/2021   Closed rib fracture 09/08/2021   2 cm pleural-based density in the right lower lobe on recent CT chest on 08/12/2021 09/08/2021   Constipation 09/08/2021   Abnormal CT scan of head 09/08/2021   Recent COVID-19 virus infection 09/08/2021   Vomiting 09/08/2021   Traumatic hematoma of buttock 09/05/2021   Urinary frequency 07/25/2021   Confusion 07/25/2021   Pleuritic chest pain 07/25/2021   Jaw pain 07/25/2021   BMI less than 19,adult 07/22/2020   Physical deconditioning 03/07/2018   Hearing loss 01/09/2018   Mild dementia ([place]) 01/09/2018   Ductal carcinoma in situ (DCIS) of right breast 09/09/2017   Unintentional weight loss 08/22/2017   Closed fracture  of nasal bones 08/10/2017   Closed fracture of left orbital floor ([place]) 08/10/2017   GERD (gastroesophageal reflux disease) 07/12/2016   Bilateral shoulder pain 04/26/2016   Polyarthralgia 04/03/2016   Greater trochanteric bursitis of left hip 06/17/2015   Allergic rhinitis 08/04/2013   Hypothyroidism, acquired,  autoimmune 05/10/2013   OAB (overactive bladder)    Primary hyperparathyroidism ([place])    Vitamin D deficiency disease    History of complete heart block-s/p Pacemaker-Medtronic-dual-chamber implantation in 2016 11/17/2010   DM-2 (A1c 6.2 on 09/05/2021) 09/08/2010   Paroxysmal atrial fibrillation 11/11/2009   TOBACCO ABUSE 03/22/2009   CAD S/P percutaneous coronary angioplasty 03/26/2008   AV block-complete-intermittent 03/26/2008   Hyperlipidemia 02/25/2008   Bipolar disorder ([place]) 02/25/2008   DIVERTICULOSIS, COLON 02/25/2008   PCP:  Binnie Rail, MD Pharmacy:   [facility] [place], [address] [place] [address] Phone: [phone] Fax: [phone]  [place], [place] - [address] [place] [address] Phone: [phone] Fax: [phone]     Social Determinants of Health (SDOH) Interventions    Readmission Risk Interventions No flowsheet data found.

## 2021-09-09 NOTE — Evaluation (Signed)
Occupational Therapy Evaluation Patient Details Name: Stacy [name] MRN: [id number] DOB: 07/24/1941 Today's Date: 09/09/2021   History of Present Illness Pt is an 81 y/o female who presented to ED after 2 recent falls with R sided gluteal hematoma, pain in R lower chest and vomiting. Pt found to have R 9th/10th rib fx, head CT negative. PMH: a fib, complete heart block-s/p PPM implantation, DM-2, ductal carcinoma in situ-s/p lumpectomy/radiation, recently diagnosed with right lung mass-scheduled for PET scan next week   Clinical Impression   PTA, pt lives with daughter and typically Independent with ADLs/mobility until recent falls. Family has encouraged pt to use RW for mobility recently. Pt presents now with deficits in strength, endurance, standing balance, pain and cognition. Pt overall Min A for bed mobility and transfers with posterior lean noted. Unable to progress mobility at this time d/t no RW in room at time of eval. Pt requires Min A for UB ADLs and overall Mod A for LB ADLs. Pt's daughter works, reports her sister will be in town to hopefully assist with pt's care at discharge. If 24/7 initial family support able to be obtained, recommend HHOT follow-up at DC. If increased support not able to be provided, would recommend SNF rehab.  SpO2 91-93% on RA BP supine: 132/61 (79) BP sitting: 124/67 (83) BP standing: 123/58 (72)     Recommendations for follow up therapy are one component of a multi-disciplinary discharge planning process, led by the attending physician.  Recommendations may be updated based on patient status, additional functional criteria and insurance authorization.   Follow Up Recommendations  Home health OT (pending ability to get 24/7 support at home)    Assistance Recommended at Discharge Frequent or constant Supervision/Assistance  Patient can return home with the following A lot of help with bathing/dressing/bathroom;Assistance with cooking/housework;A  little help with walking and/or transfers;Direct supervision/assist for medications management;Help with stairs or ramp for entrance;Assist for transportation    Functional Status Assessment  Patient has had a recent decline in their functional status and demonstrates the ability to make significant improvements in function in a reasonable and predictable amount of time.  Equipment Recommendations  None recommended by OT    Recommendations for Other Services       Precautions / Restrictions Precautions Precautions: Fall;Other (comment) Precaution Comments: R rib fx (9/10) Restrictions Weight Bearing Restrictions: No      Mobility Bed Mobility Overal bed mobility: Needs Assistance Bed Mobility: Supine to Sit, Sit to Supine     Supine to sit: Min assist Sit to supine: Min guard   General bed mobility comments: Min A to lift trunk d/t rib discomfort, able to get back in bed without assist    Transfers Overall transfer level: Needs assistance Equipment used: 1 person hand held assist Transfers: Sit to/from Stand Sit to Stand: Min assist           General transfer comment: steadying assist, posterior lean noted      Balance Overall balance assessment: Needs assistance, History of Falls Sitting-balance support: No upper extremity supported, Feet supported Sitting balance-Leahy Scale: Fair     Standing balance support: Single extremity supported, Bilateral upper extremity supported, During functional activity Standing balance-Leahy Scale: Poor Standing balance comment: reliant on UE support in standing                           ADL either performed or assessed with clinical judgement   ADL Overall ADL's :  Needs assistance/impaired Eating/Feeding: Set up;Sitting   Grooming: Set up;Sitting   Upper Body Bathing: Minimal assistance;Sitting   Lower Body Bathing: Moderate assistance;Sit to/from stand   Upper Body Dressing : Set up;Sitting   Lower Body  Dressing: Moderate assistance;Sit to/from stand   Toilet Transfer: Minimal assistance;Stand-pivot   Toileting- Clothing Manipulation and Hygiene: Moderate assistance;Sit to/from stand         General ADL Comments: Limited by expected soreness from falls/fx - posterior lean in standing and poor strength/endurance. No RW in room on entry - located and provided for next session to further assess mobility. Discussed fall prevention strategies (use of BSC at night or adequate lighting for bathroom mobility path)     Vision Ability to See in Adequate Light: 1 Impaired Patient Visual Report: No change from baseline Vision Assessment?: No apparent visual deficits     Perception     Praxis      Pertinent Vitals/Pain Pain Assessment Pain Assessment: Faces Faces Pain Scale: Hurts little more Pain Location: R side Pain Descriptors / Indicators: Grimacing, Guarding Pain Intervention(s): Monitored during session, Limited activity within patient's tolerance     Hand Dominance Right   Extremity/Trunk Assessment Upper Extremity Assessment Upper Extremity Assessment: Generalized weakness   Lower Extremity Assessment Lower Extremity Assessment: Defer to PT evaluation   Cervical / Trunk Assessment Cervical / Trunk Assessment: Other exceptions Cervical / Trunk Exceptions: R rib fx   Communication Communication Communication: No difficulties   Cognition Arousal/Alertness: Awake/alert, Lethargic Behavior During Therapy: Flat affect Overall Cognitive Status: Impaired/Different from baseline Area of Impairment: Attention, Memory, Following commands, Safety/judgement, Awareness, Problem solving                   Current Attention Level: Selective Memory: Decreased short-term memory Following Commands: Follows one step commands with increased time Safety/Judgement: Decreased awareness of deficits, Decreased awareness of safety Awareness: Emergent Problem Solving: Slow processing,  Difficulty sequencing, Decreased initiation, Requires verbal cues, Requires tactile cues General Comments: lethargic this AM, had recently received morphine for pain. increased time to follow commands, daughter assisting with PLOF/answering questions     General Comments       Exercises     Shoulder Instructions      Home Living Family/patient expects to be discharged to:: Private residence Living Arrangements: Children Available Help at Discharge: Family;Available PRN/intermittently Type of Home: House Home Access: Stairs to enter Entrance Stairs-Number of Steps: 4 + 1 Entrance Stairs-Rails: Right Home Layout: Two level;Able to live on main level with bedroom/bathroom     Bathroom Shower/Tub: Occupational psychologist: Standard (BSC over it now)     Home Equipment: Conservation officer, nature (2 wheels);BSC/3in1;Shower seat;Hand held shower head;Other (comment)   Additional Comments: adjustable bed; daughter is an Therapist, sports at [employer] - works approx3 13 hr shifts per week. her sister is flying in from [place] to assist with pt at home initially      Prior Functioning/Environment Prior Level of Function : Needs assist;History of Falls (last six months)       Physical Assist : ADLs (physical)   ADLs (physical): IADLs Mobility Comments: typically no use of AD but recently encouraged to use RW after fall prior to Thanksgiving 2022. ADLs Comments: Typically able to complete ADLs Independently though increasing difficulty after falls leading to admission.        OT Problem List: Decreased strength;Decreased activity tolerance;Impaired balance (sitting and/or standing);Decreased cognition;Decreased safety awareness;Decreased knowledge of use of DME or AE;Pain  OT Treatment/Interventions: Self-care/ADL training;Therapeutic exercise;Energy conservation;DME and/or AE instruction;Therapeutic activities;Patient/family education;Balance training    OT Goals(Current goals can  be found in the care plan section) Acute Rehab OT Goals Patient Stated Goal: for pt to have BM, pain control, be able to gradually improve strength/mobility OT Goal Formulation: With patient Time For Goal Achievement: 09/23/21 Potential to Achieve Goals: Good  OT Frequency: Min 2X/week    Co-evaluation              AM-PAC OT "6 Clicks" Daily Activity     Outcome Measure Help from another person eating meals?: A Little Help from another person taking care of personal grooming?: A Little Help from another person toileting, which includes using toliet, bedpan, or urinal?: A Lot Help from another person bathing (including washing, rinsing, drying)?: A Lot Help from another person to put on and taking off regular upper body clothing?: A Little Help from another person to put on and taking off regular lower body clothing?: A Lot 6 Click Score: 15   End of Session Nurse Communication: Mobility status  Activity Tolerance: Patient tolerated treatment well;Patient limited by lethargy Patient left: in bed;with call bell/phone within reach;with bed alarm set;with family/visitor present  OT Visit Diagnosis: Unsteadiness on feet (R26.81);Other abnormalities of gait and mobility (R26.89);Muscle weakness (generalized) (M62.81);History of falling (Z91.81)                Time: 3254-9826 OT Time Calculation (min): 18 min Charges:  OT General Charges $OT Visit: 1 Visit OT Evaluation $OT Eval Low Complexity: 1 Low  Malachy Chamber, OTR/L Acute Rehab Services Office: [phone]   Layla Maw 09/09/2021, 7:48 AM

## 2021-09-09 NOTE — Progress Notes (Addendum)
PROGRESS NOTE        PATIENT DETAILS Name: Stacy [name] Age: 81 y.o. Sex: female Date of Birth: September 07, 1940 Admit Date: 09/08/2021 Admitting Physician Evalee Mutton Kristeen Mans, MD PZW:CHENI, Claudina Lick, MD  Brief Summary: Patient is a 81 y.o.  female with history of PAF on anticoagulation with Pradaxa, complete heart block-s/p PPM implantation, DM-2, ductal carcinoma in situ-s/p lumpectomy/radiation, recently diagnosed with a right lower lobe lung mass-presented to the ED with 2 episodes of recent fall-and associated right gluteal hematoma/right ninth and 10th rib fracture-along with worsening pain/vomiting/constipation and mild acute blood loss anemia.  See below for further details.   Significant Hospital events: 2/2>> admit to [facility] for frequent falls/right gluteal hematoma/ninth/10th rib fracture, nausea/vomiting  Significant imaging studies: 2/2>> CTA chest: No PE, subacute right 10th rib fracture.  Interval increase in right lower lobe pleural-based opacities. 2/2>> CT abdomen/pelvis: Right gluteal hematoma 6.4 x 3.3 x 3.5 cm.  Large amount of retained colonic stool. 2/2>> CT C-spine: No fracture/dislocation 2/2>> CT head: 4 mm subtle density in the anterior left sylvian fissure in the frontal lobe-artifactual per neurosurgery.  Significant microbiology data: 2/2>> COVID/influenza PCR  Procedures: None  Consults:  2/2 >>telephone consult with neurosurgery by ED MD-no need to repeat CT head.   Subjective: Lying comfortably in bed-denies any chest pain or shortness of breath.  Continues to have pain mostly in her right sided chest area.  Somewhat drowsy after she was given tramadol earlier this morning.  Had BM-although very small earlier this morning.  Objective: Vitals: Blood pressure 135/69, pulse 64, temperature 98.3 F (36.8 C), temperature source Oral, resp. rate 16, height 5\' 4"  (1.626 m), weight 44.8 kg, SpO2 93 %.   Exam: Gen Exam:Alert  awake-not in any distress HEENT:atraumatic, normocephalic Chest: B/L clear to auscultation anteriorly CVS:S1S2 regular Abdomen:soft non tender, non distended Extremities:no edema Neurology: Non focal Skin: no rash  Pertinent Labs/Radiology: CBC Latest Ref Rng & Units 09/09/2021 09/08/2021 09/05/2021  WBC 4.0 - 10.5 K/uL 12.0(H) 11.6(H) 15.3(H)  Hemoglobin 12.0 - 15.0 g/dL 9.9(L) 10.8(L) 12.0  Hematocrit 36.0 - 46.0 % 30.7(L) 32.6(L) 36.6  Platelets 150 - 400 K/uL 168 164 145.0(L)    Lab Results  Component Value Date   NA 136 09/09/2021   K 4.6 09/09/2021   CL 102 09/09/2021   CO2 27 09/09/2021      Assessment/Plan: * Fall- (present on admission) Had 2 episodes of mechanical fall on Sunday and Monday with resultant right gluteal hematoma and fracture of right10th rib.  Plans are for supportive care-orthostatic vital signs were negative.  Vomiting- (present on admission) Resolved-either from constipation or from narcotics that she received in ED.  Benign abdominal exam.  Abnormal CT scan of head- (present on admission) ED MD discussed abnormal CT head findings with neurosurgeon-Dr. Cabbell-thought to be artifactual and not thought to have SAH/SDH.  Closed rib fracture- (present on admission) Likely sustained during her falls either on Sunday or Monday.  Plans are for supportive care-encourage incentive spirometry.  She was given tramadol earlier this morning and is very drowsy-continue scheduled Tylenol, decrease tramadol to 25 mg-add Lidoderm patches.  Given advanced age and frailty-needs inpatient optimization of her regimen before consideration of discharge.  Patient is opiate nave.  Traumatic hematoma of buttock- (present on admission) Per daughter-size of hematoma has already decreased over the past few days-resuming  Pradaxa cautiously-follow.    Acute posthemorrhagic anemia- (present on admission) Mild drop in hemoglobin-likely due to blood loss from hematoma in the right  gluteal area.  This is already at least 64-22 days old-Per daughter size of hematoma is already decreasing.  Cautiously continuing with Pradaxa starting tomorrow-follow serial CBC  Constipation- (present on admission) Likely due to narcotics-head small BM with MiraLAX/senna-will give 1 dose of Dulcolax suppository.  Reassess on 2/4.  CAD S/P percutaneous coronary angioplasty No chest pain or shortness of breath-she has a remote history of a PCI.  Suspect she is not on any antiplatelet agents as she is on anticoagulation.  Paroxysmal atrial fibrillation- (present on admission) Maintaining sinus rhythm-monitor on telemetry.  She has had frequent falls recently-had a fall around Thanksgiving of last year as well.  Await PT/OT eval-family planning to provide 24/7 care-hence should be okay to continue anticoagulation cautiously.  History of complete heart block-s/p Pacemaker-[employer]-dual-chamber implantation in 2016- (present on admission) Monitor on telemetry.  DM-2 (A1c 6.2 on 09/05/2021) CBG stable with SSI-resume metformin on discharge.   Recent Labs    09/08/21 2231 09/09/21 0840  GLUCAP 150* 152*    Hyperlipidemia- (present on admission) Continue statin  2 cm pleural-based density in the right lower lobe on recent CT chest on 08/12/2021- (present on admission) Recently diagnosed with a right lung mass-is scheduled for PET scan next week.  She has a history of smoking-and has a history of breast cancer.  Followed by Dr. York Spaniel.  Bipolar disorder ([place])- (present on admission) Continue lithium-levels were stable on admission.  Hypothyroidism, acquired, autoimmune- (present on admission) Continue levothyroxine  Ductal carcinoma in situ (DCIS) of right breast- (present on admission) History of lumpectomy and radiation in 2019.  Per outpatient radiation oncology note-recent mammography on December 2022 demonstrated no evidence of malignancy.  Follows with radiation  oncology-Dr. Dwana Curd.  Mild dementia ([place])- (present on admission) Per daughter-has vascular dementia-appears to be mild-maintain delirium precautions for  Recent COVID-19 virus infection Per patient's daughter-patient was diagnosed with COVID in early January-and completed a 5-day course of Molnuparivir.  She currently does not have any symptoms.  BMI: Estimated body mass index is 16.95 kg/m as calculated from the following:   Height as of this encounter: 5\' 4"  (1.626 m).   Weight as of this encounter: 44.8 kg.   Code status:   Code Status: Full Code   DVT Prophylaxis: dabigatran (PRADAXA) capsule 75 mg     Family Communication: Daughter at bedside  Disposition Plan: Status is: Observation The patient remains OBS appropriate and will d/c before 2 midnights.  Ambulate with PT-optimize narcotics/pain control (very drowsy this morning)-we will need to ensure safe disposition on discharge.    Planned Discharge Destination:Home health vs SNF (if family unable to provide 24/7 care)   Diet: Diet Order             Diet heart healthy/carb modified Room service appropriate? Yes; Fluid consistency: Thin  Diet effective now                     Antimicrobial agents: Anti-infectives (From admission, onward)    None        MEDICATIONS: Scheduled Meds:  acetaminophen  1,000 mg Oral Q8H   atorvastatin  80 mg Oral Once per day on Sun Mon Wed Fri   busPIRone  15 mg Oral q AM   dabigatran  75 mg Oral BID   donepezil  10 mg Oral QHS   FLUoxetine  20 mg Oral q AM   insulin aspart  0-9 Units Subcutaneous TID WC   levothyroxine  112 mcg Oral Once per day on Mon Wed Fri   [START ON 09/10/2021] levothyroxine  168 mcg Oral Once per day on Sun Tue Thu Sat   lidocaine  2 patch Transdermal Q24H   lithium carbonate  300 mg Oral QHS   loratadine  10 mg Oral q AM   melatonin  5 mg Oral QHS   multivitamin with minerals  1 tablet Oral q morning   oxybutynin  5 mg Oral BID    polyethylene glycol  17 g Oral BID   Ensure Max Protein  237 mL Oral BID BM   pyridoxine  500 mg Oral Daily   vitamin B-1  250 mg Oral QHS   Continuous Infusions:  methocarbamol (ROBAXIN) IV     PRN Meds:.albuterol, bisacodyl, diclofenac Sodium, ketotifen, methocarbamol (ROBAXIN) IV, naLOXone (NARCAN)  injection, ondansetron (ZOFRAN) IV, polyvinyl alcohol, senna-docusate, traMADol   I have personally reviewed following labs and imaging studies  LABORATORY DATA: CBC: Recent Labs  Lab 09/05/21 1640 09/08/21 1122 09/09/21 0105  WBC 15.3* 11.6* 12.0*  NEUTROABS 11.5* 9.5*  --   HGB 12.0 10.8* 9.9*  HCT 36.6 32.6* 30.7*  MCV 96.2 97.6 97.8  PLT 145.0* 164 350    Basic Metabolic Panel: Recent Labs  Lab 09/05/21 1640 09/08/21 1122 09/09/21 0105  NA 131* 137 136  K 4.3 4.4 4.6  CL 94* 101 102  CO2 30 27 27   GLUCOSE 166* 233* 198*  BUN 36* 28* 25*  CREATININE 1.10 1.00 1.10*  CALCIUM 10.7* 10.7* 10.3    GFR: Estimated Creatinine Clearance: 28.8 mL/min (A) (by C-G formula based on SCr of 1.1 mg/dL (H)).  Liver Function Tests: Recent Labs  Lab 09/05/21 1640 09/08/21 1122 09/09/21 0105  AST 27 26 22   ALT 20 22 19   ALKPHOS 44 42 38  BILITOT 0.4 0.9 0.8  PROT 7.0 6.2* 5.8*  ALBUMIN 4.3 3.4* 3.1*   No results for input(s): LIPASE, AMYLASE in the last 168 hours. No results for input(s): AMMONIA in the last 168 hours.  Coagulation Profile: No results for input(s): INR, PROTIME in the last 168 hours.  Cardiac Enzymes: No results for input(s): CKTOTAL, CKMB, CKMBINDEX, TROPONINI in the last 168 hours.  BNP (last 3 results) No results for input(s): PROBNP in the last 8760 hours.  Lipid Profile: No results for input(s): CHOL, HDL, LDLCALC, TRIG, CHOLHDL, LDLDIRECT in the last 72 hours.  Thyroid Function Tests: No results for input(s): TSH, T4TOTAL, FREET4, T3FREE, THYROIDAB in the last 72 hours.  Anemia Panel: No results for input(s): VITAMINB12, FOLATE,  FERRITIN, TIBC, IRON, RETICCTPCT in the last 72 hours.  Urine analysis:    Component Value Date/Time   COLORURINE YELLOW 09/08/2021 [place] 09/08/2021 1119   LABSPEC 1.020 09/08/2021 1119   PHURINE 7.0 09/08/2021 1119   GLUCOSEU NEGATIVE 09/08/2021 1119   GLUCOSEU NEGATIVE 09/05/2021 1640   HGBUR NEGATIVE 09/08/2021 1119   BILIRUBINUR NEGATIVE 09/08/2021 1119   BILIRUBINUR negative 10/05/2020 1511   KETONESUR NEGATIVE 09/08/2021 1119   PROTEINUR NEGATIVE 09/08/2021 1119   UROBILINOGEN 0.2 09/05/2021 1640   NITRITE NEGATIVE 09/08/2021 1119   LEUKOCYTESUR NEGATIVE 09/08/2021 1119    Sepsis Labs: Lactic Acid, Venous    Component Value Date/Time   LATICACIDVEN 1.9 09/27/2020 1026    MICROBIOLOGY: Recent Results (from the past 240 hour(s))  Urine Culture  Status: None   Collection Time: 09/05/21  4:40 PM   Specimen: Blood  Result Value Ref Range Status   Source: NOT GIVEN  Final   Status: FINAL  Final   Result: No Growth  Final  Resp Panel by RT-PCR (Flu A&B, Covid) Nasopharyngeal Swab     Status: None   Collection Time: 09/08/21  7:44 PM   Specimen: Nasopharyngeal Swab; Nasopharyngeal(NP) swabs in vial transport medium  Result Value Ref Range Status   SARS Coronavirus 2 by RT PCR NEGATIVE NEGATIVE Final    Comment: (NOTE) SARS-CoV-2 target nucleic acids are NOT DETECTED.  The SARS-CoV-2 RNA is generally detectable in upper respiratory specimens during the acute phase of infection. The lowest concentration of SARS-CoV-2 viral copies this assay can detect is 138 copies/mL. A negative result does not preclude SARS-Cov-2 infection and should not be used as the sole basis for treatment or other patient management decisions. A negative result may occur with  improper specimen collection/handling, submission of specimen other than nasopharyngeal swab, presence of viral mutation(s) within the areas targeted by this assay, and inadequate number of  viral copies(<138 copies/mL). A negative result must be combined with clinical observations, patient history, and epidemiological information. The expected result is Negative.  Fact Sheet for Patients:  EntrepreneurPulse.com.au  Fact Sheet for Healthcare Providers:  IncredibleEmployment.be  This test is no t yet approved or cleared by the [place] FDA and  has been authorized for detection and/or diagnosis of SARS-CoV-2 by FDA under an Emergency Use Authorization (EUA). This EUA will remain  in effect (meaning this test can be used) for the duration of the COVID-19 declaration under Section 564(b)(1) of the Act, 21 U.S.C.section 360bbb-3(b)(1), unless the authorization is terminated  or revoked sooner.       Influenza A by PCR NEGATIVE NEGATIVE Final   Influenza B by PCR NEGATIVE NEGATIVE Final    Comment: (NOTE) The Xpert Xpress SARS-CoV-2/FLU/RSV plus assay is intended as an aid in the diagnosis of influenza from Nasopharyngeal swab specimens and should not be used as a sole basis for treatment. Nasal washings and aspirates are unacceptable for Xpert Xpress SARS-CoV-2/FLU/RSV testing.  Fact Sheet for Patients: EntrepreneurPulse.com.au  Fact Sheet for Healthcare Providers: IncredibleEmployment.be  This test is not yet approved or cleared by the [place] FDA and has been authorized for detection and/or diagnosis of SARS-CoV-2 by FDA under an Emergency Use Authorization (EUA). This EUA will remain in effect (meaning this test can be used) for the duration of the COVID-19 declaration under Section 564(b)(1) of the Act, 21 U.S.C. section 360bbb-3(b)(1), unless the authorization is terminated or revoked.  Performed at [facility], [place] [address] [place], [place] [address]     RADIOLOGY STUDIES/RESULTS: DG Chest 2 View  Result Date: 09/08/2021 CLINICAL DATA:  Shortness of breath  fracture right ribs EXAM: CHEST - 2 VIEW COMPARISON:  09/07/2021 FINDINGS: Cardiac size is within normal limits. Low position of diaphragms suggests COPD. There is slightly displaced fracture in the lateral aspect of right tenth rib. Linear densities seen in the right lower lung fields appear less prominent. There are no new infiltrates or signs of pulmonary edema. Left lateral CP angle is clear. There is no pneumothorax. Pacemaker battery is seen in the left infraclavicular region with tips leads in the right atrium and right ventricle. There is evidence of ununited fracture in the shaft of right clavicle. Pacemaker battery is seen in the left infraclavicular region. IMPRESSION: COPD. Increased interstitial markings  in the right lower lung fields may suggest scarring and possibly atelectasis. There is improvement in aeration of right lower lung fields. No new focal infiltrates are seen. Fracture is seen in the lateral aspect of right tenth rib. Blunting of right lateral CP angles may suggest pleural thickening or minimal effusion. Electronically Signed   By: Elmer Picker M.D.   On: 09/08/2021 12:06   DG Abd 1 View  Result Date: 09/08/2021 CLINICAL DATA:  Right upper quadrant abdominal pain for the past 3 days. EXAM: ABDOMEN - 1 VIEW COMPARISON:  Abdomen and pelvis CT dated 03/23/2008. FINDINGS: Normal bowel gas pattern. Prominent stool throughout the colon. Mild lumbar and lower thoracic spine scoliosis and degenerative changes. Atheromatous arterial calcifications. IMPRESSION: No acute abnormality.  Prominent stool throughout the colon. Electronically Signed   By: Claudie Revering M.D.   On: 09/08/2021 13:12   CT Head Wo Contrast  Result Date: 09/08/2021 CLINICAL DATA:  Trauma EXAM: CT HEAD WITHOUT CONTRAST TECHNIQUE: Contiguous axial images were obtained from the base of the skull through the vertex without intravenous contrast. RADIATION DOSE REDUCTION: This exam was performed according to the  departmental dose-optimization program which includes automated exposure control, adjustment of the mA and/or kV according to patient size and/or use of iterative reconstruction technique. COMPARISON:  08/11/2013 FINDINGS: Brain: In the image 14 of series 3, there is 4 mm subtle focus of increased density in the left frontal lobe adjacent to the anterior left sylvian fissure. There is no surrounding edema or mass effect. Cortical sulci are prominent. There is possible small old lacunar infarct in basal ganglia on the left side. Vascular: There are scattered arterial calcifications. Skull: No fracture is seen. Sinuses/Orbits: There is mucosal thickening in the right frontal sinus and posterior sphenoid sinus. Other: None IMPRESSION: There is new 4 mm area of subtle increased density in the anterior left sylvian fissure in the left frontal lobe. This may suggest partial volume averaging artifact or small focus of subarachnoid hemorrhage. There is no focal edema or mass effect. There are no epidural or subdural fluid collections. Short-term follow-up CT or MRI may be considered. Atrophy.  Small-vessel disease. Imaging findings were discussed with Dr. Tinnie Gens by telephone call. Electronically Signed   By: Elmer Picker M.D.   On: 09/08/2021 13:00   CT Angio Chest PE W and/or Wo Contrast  Result Date: 09/08/2021 CLINICAL DATA:  Pulmonary embolism suspected. Concern for retroperitoneal bleed. EXAM: CT ANGIOGRAPHY CHEST CT ABDOMEN AND PELVIS WITH CONTRAST TECHNIQUE: Multidetector CT imaging of the chest was performed using the standard protocol during bolus administration of intravenous contrast. Multiplanar CT image reconstructions and MIPs were obtained to evaluate the vascular anatomy. Multidetector CT imaging of the abdomen and pelvis was performed using the standard protocol during bolus administration of intravenous contrast. RADIATION DOSE REDUCTION: This exam was performed according to the departmental  dose-optimization program which includes automated exposure control, adjustment of the mA and/or kV according to patient size and/or use of iterative reconstruction technique. CONTRAST:  Intravenous contrast COMPARISON:  None. CT chest dated August 10, 2021 FINDINGS: CTA CHEST FINDINGS Cardiovascular: Satisfactory opacification of the pulmonary arteries to the segmental level. No evidence of pulmonary embolism. Normal heart size. Mild coronary artery atherosclerotic calcifications. No pericardial effusion. Mediastinum/Nodes: No enlarged mediastinal, hilar, or axillary lymph nodes. Thyroid gland, trachea, and esophagus demonstrate no significant findings. Bilateral calcified mediastinal lymph nodes. Lungs/Pleura: There are right lower lobe peribronchovascular and pleural-based opacities as well as scattered nodular opacities in the  right lower lobe with right basilar atelectasis. These no densities has increased in size and number since prior CT examination and are highly suspicious for malignant process. Moderate emphysematous changes of bilateral upper lobes. No pleural effusion or pneumothorax. Upper Abdomen: No acute abnormality. Musculoskeletal: Mildly displaced subacute fracture of the right tenth rib, unchanged. Multilevel degenerative disc disease with subchondral sclerosis. No suspicious osseous lesion. Review of the MIP images confirms the above findings. CT ABDOMEN and PELVIS FINDINGS Hepatobiliary: No focal liver abnormality is seen. No gallstones, gallbladder wall thickening, or biliary dilatation. Pancreas: Unremarkable. No pancreatic ductal dilatation or surrounding inflammatory changes. Spleen: Normal in size without focal abnormality. Adrenals/Urinary Tract: Adrenal glands are unremarkable. Kidneys are normal, without renal calculi, focal lesion, or hydronephrosis. Bladder is unremarkable. Stomach/Bowel: Stomach is within normal limits. Appendix appears normal. No evidence of bowel wall thickening,  distention, or inflammatory changes. Large amount of retained colonic stool suggesting constipation. Vascular/Lymphatic: Aortic atherosclerosis. No enlarged abdominal or pelvic lymph nodes. Reproductive: Status post hysterectomy. No adnexal masses. Other: No abdominal wall hernia or abnormality. No abdominopelvic ascites. Musculoskeletal: Right gluteal region soft tissue density measuring 6.4 x 3.3 x 3.5 cm likely representing hematoma. Multilevel degenerative disc disease of the lumbar spine. IMPRESSION: 1. Interval increase in right lower lobe peribronchovascular and pleural-based opacities concerning for primary lung malignancy/metastatic disease. Differential includes infectious/inflammatory process. Follow-up examination to resolution is recommended. 2.  No evidence of pulmonary embolism. 3.  Advanced emphysematous changes. 4. Subacute fracture of the right tenth rib, as seen on prior examination. 5. Right gluteal region subcutaneous soft tissue structure, likely hematoma measuring approximately 6.4 x 3.3 x 3.5 cm. No evidence of retroperitoneal hematoma. 6.  Large amount of retained colonic stool suggesting constipation. Findings were reported to Dr. Debe Coder at approximately 4:20 p.m. on 09/08/2021. Electronically Signed   By: Keane Police D.O.   On: 09/08/2021 16:21   CT Cervical Spine Wo Contrast  Result Date: 09/08/2021 CLINICAL DATA:  Trauma, fall EXAM: CT CERVICAL SPINE WITHOUT CONTRAST TECHNIQUE: Multidetector CT imaging of the cervical spine was performed without intravenous contrast. Multiplanar CT image reconstructions were also generated. RADIATION DOSE REDUCTION: This exam was performed according to the departmental dose-optimization program which includes automated exposure control, adjustment of the mA and/or kV according to patient size and/or use of iterative reconstruction technique. COMPARISON:  None. FINDINGS: Alignment: Alignment of posterior margins of vertebral bodies is unremarkable.  Skull base and vertebrae: No recent fracture is seen. There is 5 mm smooth marginated calcification posterior to the spinous process of C5 vertebra suggesting ligament calcification from previous injury. Degenerative changes are noted in the cervical spine, more prominent at C4-C5, C5-C6 and C6-C7 levels. Soft tissues and spinal canal: There is no significant spinal stenosis. Disc levels: There is encroachment of neural foramina from C4-C7 levels. Upper chest: Unremarkable. Other: Thyroid appears smaller than usual in size. IMPRESSION: No recent fracture is seen in the cervical spine. Cervical spondylosis with encroachment of neural foramina from C4-C7 levels. Electronically Signed   By: Elmer Picker M.D.   On: 09/08/2021 13:03   CT ABDOMEN PELVIS W CONTRAST  Result Date: 09/09/2021 CLINICAL DATA:  Pulmonary embolism suspected. Concern for retroperitoneal bleed. EXAM: CT ANGIOGRAPHY CHEST CT ABDOMEN AND PELVIS WITH CONTRAST TECHNIQUE: Multidetector CT imaging of the chest was performed using the standard protocol during bolus administration of intravenous contrast. Multiplanar CT image reconstructions and MIPs were obtained to evaluate the vascular anatomy. Multidetector CT imaging of the abdomen and pelvis was performed  using the standard protocol during bolus administration of intravenous contrast. RADIATION DOSE REDUCTION: This exam was performed according to the departmental dose-optimization program which includes automated exposure control, adjustment of the mA and/or kV according to patient size and/or use of iterative reconstruction technique. CONTRAST:  Intravenous contrast COMPARISON:  None. CT chest dated August 10, 2021 FINDINGS: CTA CHEST FINDINGS Cardiovascular: Satisfactory opacification of the pulmonary arteries to the segmental level. No evidence of pulmonary embolism. Normal heart size. Mild coronary artery atherosclerotic calcifications. No pericardial effusion. Mediastinum/Nodes: No  enlarged mediastinal, hilar, or axillary lymph nodes. Thyroid gland, trachea, and esophagus demonstrate no significant findings. Bilateral calcified mediastinal lymph nodes. Lungs/Pleura: There are right lower lobe peribronchovascular and pleural-based opacities as well as scattered nodular opacities in the right lower lobe with right basilar atelectasis. These no densities has increased in size and number since prior CT examination and are highly suspicious for malignant process. Moderate emphysematous changes of bilateral upper lobes. No pleural effusion or pneumothorax. Upper Abdomen: No acute abnormality. Musculoskeletal: Mildly displaced subacute fracture of the right tenth rib, unchanged. Multilevel degenerative disc disease with subchondral sclerosis. No suspicious osseous lesion. Review of the MIP images confirms the above findings. CT ABDOMEN and PELVIS FINDINGS Hepatobiliary: No focal liver abnormality is seen. No gallstones, gallbladder wall thickening, or biliary dilatation. Pancreas: Unremarkable. No pancreatic ductal dilatation or surrounding inflammatory changes. Spleen: Normal in size without focal abnormality. Adrenals/Urinary Tract: Adrenal glands are unremarkable. Kidneys are normal, without renal calculi, focal lesion, or hydronephrosis. Bladder is unremarkable. Stomach/Bowel: Stomach is within normal limits. Appendix appears normal. No evidence of bowel wall thickening, distention, or inflammatory changes. Large amount of retained colonic stool suggesting constipation. Vascular/Lymphatic: Aortic atherosclerosis. No enlarged abdominal or pelvic lymph nodes. Reproductive: Status post hysterectomy. No adnexal masses. Other: No abdominal wall hernia or abnormality. No abdominopelvic ascites. Musculoskeletal: Right gluteal region soft tissue density measuring 6.4 x 3.3 x 3.5 cm likely representing hematoma. Multilevel degenerative disc disease of the lumbar spine. IMPRESSION: 1. Interval increase in  right lower lobe peribronchovascular and pleural-based opacities concerning for primary lung malignancy/metastatic disease. Differential includes infectious/inflammatory process. Follow-up examination to resolution is recommended. 2.  No evidence of pulmonary embolism. 3.  Advanced emphysematous changes. 4. Subacute fracture of the right tenth rib, as seen on prior examination. 5. Right gluteal region subcutaneous soft tissue structure, likely hematoma measuring approximately 6.4 x 3.3 x 3.5 cm. No evidence of retroperitoneal hematoma. 6.  Large amount of retained colonic stool suggesting constipation. Findings were reported to Dr. Debe Coder at approximately 4:20 p.m. on 09/08/2021. Electronically Signed   By: Keane Police D.O.   On: 09/09/2021 09:40     LOS: 0 days   Oren Binet, MD  [facility]    To contact the attending provider between 7A-7P or the covering provider during after hours 7P-7A, please log into the web site [email or web address] and access using universal [place] password for that web site. If you do not have the password, please call the hospital operator.  09/09/2021, 12:54 PM

## 2021-09-09 NOTE — Hospital Course (Signed)
Patient is a 81 y.o.  female with history of PAF on anticoagulation with Pradaxa, complete heart block-s/p PPM implantation, DM-2, ductal carcinoma in situ-s/p lumpectomy/radiation, recently diagnosed with a right lower lobe lung mass-presented to the ED with 2 episodes of recent fall-and associated right gluteal hematoma/right ninth and 10th rib fracture-along with worsening pain/vomiting/constipation and mild acute blood loss anemia.  See below for further details.   Significant Hospital events: 2/2>> admit to [facility] for frequent falls/right gluteal hematoma/ninth/10th rib fracture, nausea/vomiting  Significant imaging studies: 2/2>> CTA chest: No PE, subacute right 10th rib fracture.  Interval increase in right lower lobe pleural-based opacities. 2/2>> CT abdomen/pelvis: Right gluteal hematoma 6.4 x 3.3 x 3.5 cm.  Large amount of retained colonic stool. 2/2>> CT C-spine: No fracture/dislocation 2/2>> CT head: 4 mm subtle density in the anterior left sylvian fissure in the frontal lobe-artifactual per neurosurgery.  Significant microbiology data: 2/2>> COVID/influenza PCR  Procedures: None  Consults:  2/2 >>telephone consult with neurosurgery by ED MD-no need to repeat CT head.

## 2021-09-09 NOTE — Evaluation (Signed)
Physical Therapy Evaluation Patient Details Name: Stacy [name] MRN: [id number] DOB: January 28, 1941 Today's Date: 09/09/2021  History of Present Illness  Pt is an 81 y/o female who presented to ED 2/2 after 2 recent falls with R sided gluteal hematoma, pain in R lower chest and vomiting. Pt found to have R 9th/10th rib fx, head CT negative. PMH: a fib, complete heart block-s/p PPM implantation, DM-2, ductal carcinoma in situ-s/p lumpectomy/radiation, recently diagnosed with right lung mass-scheduled for PET scan next week  Clinical Impression  Pt presents with new need for O2, able to maintain O2 sats, BP and HR within normal limits despite her mult conditions.  Have requested a CIR consult due to weakness and change in functional mobility status for this pt, who will be cared for by family for the meantime when getting home.  Her need for strengthening, for stability of gait and to increase standing endurance and balance will make her a good CIR candidate.  Family is expecting to take her home if this is not approved, and will therefore recommend next session to review stairs if pt is able.  Follow for acute PT goals as are outlined below.     Recommendations for follow up therapy are one component of a multi-disciplinary discharge planning process, led by the attending physician.  Recommendations may be updated based on patient status, additional functional criteria and insurance authorization.  Follow Up Recommendations Acute inpatient rehab (3hours/day)    Assistance Recommended at Discharge Frequent or constant Supervision/Assistance  Patient can return home with the following  A lot of help with walking and/or transfers;A lot of help with bathing/dressing/bathroom;Assistance with cooking/housework;Direct supervision/assist for medications management;Assist for transportation;Help with stairs or ramp for entrance    Equipment Recommendations None recommended by PT  Recommendations for  Other Services  Rehab consult    Functional Status Assessment Patient has had a recent decline in their functional status and demonstrates the ability to make significant improvements in function in a reasonable and predictable amount of time.     Precautions / Restrictions Precautions Precautions: Fall;Other (comment) Precaution Comments: R rib fx (9/10) Restrictions Weight Bearing Restrictions: No Other Position/Activity Restrictions: monitor for R hip hematoma and listing to R side      Mobility  Bed Mobility Overal bed mobility: Needs Assistance Bed Mobility: Supine to Sit, Sit to Supine     Supine to sit: Min assist Sit to supine: Mod assist   General bed mobility comments: mod assist due to pain and struggle with LE"s    Transfers Overall transfer level: Needs assistance Equipment used: Rolling walker (2 wheels), 1 person hand held assist Transfers: Sit to/from Stand Sit to Stand: Min assist           General transfer comment: posterior and R sideway leaning    Ambulation/Gait Ambulation/Gait assistance: Min assist Gait Distance (Feet): 8 Feet Assistive device: Rolling walker (2 wheels), 1 person hand held assist Gait Pattern/deviations: Step-to pattern, Decreased stride length, Shuffle Gait velocity: reduced Gait velocity interpretation: <1.31 ft/sec, indicative of household ambulator Pre-gait activities: standing balance cues on RW and assist to support with UE General Gait Details: pt is weak but with help to shift walker can take sidesteps and leans into bed for support posteriorly  Stairs            Wheelchair Mobility    Modified Rankin (Stroke Patients Only)       Balance Overall balance assessment: Needs assistance, History of Falls Sitting-balance support: Feet supported  Sitting balance-Leahy Scale: Fair     Standing balance support: Bilateral upper extremity supported, During functional activity Standing balance-Leahy Scale:  Poor                               Pertinent Vitals/Pain Pain Assessment Pain Assessment: Faces Faces Pain Scale: Hurts even more Pain Location: R hip and bruised back Pain Descriptors / Indicators: Grimacing, Guarding Pain Intervention(s): Limited activity within patient's tolerance, Repositioned, Monitored during session    Home Living Family/patient expects to be discharged to:: Private residence Living Arrangements: Children Available Help at Discharge: Family;Available PRN/intermittently Type of Home: House Home Access: Stairs to enter Entrance Stairs-Rails: Right Entrance Stairs-Number of Steps: 4 + 1   Home Layout: Two level;Able to live on main level with bedroom/bathroom Home Equipment: Rolling Walker (2 wheels);BSC/3in1;Shower seat;Hand held shower head;Other (comment);Cane - single point;Rollator (4 wheels) Additional Comments: pt has daughter who is cardiac RN at [employer] and another daughter coming from [facility] [facility] to help with mother's care    Prior Function Prior Level of Function : Needs assist;History of Falls (last six months)       Physical Assist : Mobility (physical) (no AD needed)     Mobility Comments: has walked without AD but has fallen       Hand Dominance   Dominant Hand: Right    Extremity/Trunk Assessment   Upper Extremity Assessment Upper Extremity Assessment: Defer to OT evaluation    Lower Extremity Assessment Lower Extremity Assessment: Generalized weakness    Cervical / Trunk Assessment Cervical / Trunk Assessment: Other exceptions Cervical / Trunk Exceptions: R rib fx  Communication   Communication: No difficulties  Cognition Arousal/Alertness: Awake/alert Behavior During Therapy: Flat affect Overall Cognitive Status: Impaired/Different from baseline Area of Impairment: Problem solving, Awareness, Safety/judgement, Following commands, Memory, Attention                   Current Attention Level: Selective Memory:  Decreased short-term memory Following Commands: Follows one step commands with increased time Safety/Judgement: Decreased awareness of deficits, Decreased awareness of safety Awareness: Intellectual, Emergent Problem Solving: Slow processing, Difficulty sequencing, Requires verbal cues, Requires tactile cues General Comments: required reminders for her control of standing balance with daughter remaining at her side        General Comments General comments (skin integrity, edema, etc.): pt is assisted to stand and wgt shift, to move walker and practice her sidesteps on RW.  Check of BP reveals no signs of orthostasis, no low sats and no HR abnomralities    Exercises     Assessment/Plan    PT Assessment Patient needs continued PT services  PT Problem List Decreased strength;Decreased range of motion;Decreased activity tolerance;Decreased balance;Decreased mobility;Decreased coordination;Decreased cognition;Decreased knowledge of use of DME;Decreased safety awareness;Decreased skin integrity;Pain       PT Treatment Interventions DME instruction;Gait training;Stair training;Functional mobility training;Therapeutic activities;Therapeutic exercise;Balance training;Neuromuscular re-education;Patient/family education    PT Goals (Current goals can be found in the Care Plan section)  Acute Rehab PT Goals Patient Stated Goal: none stated PT Goal Formulation: With patient/family Time For Goal Achievement: 09/23/21 Potential to Achieve Goals: Good    Frequency Min 3X/week     Co-evaluation               AM-PAC PT "6 Clicks" Mobility  Outcome Measure Help needed turning from your back to your side while in a flat bed without using bedrails?: A Little Help  needed moving from lying on your back to sitting on the side of a flat bed without using bedrails?: A Little Help needed moving to and from a bed to a chair (including a wheelchair)?: A Little Help needed standing up from a chair  using your arms (e.g., wheelchair or bedside chair)?: A Little Help needed to walk in hospital room?: A Lot Help needed climbing 3-5 steps with a railing? : Total 6 Click Score: 15    End of Session Equipment Utilized During Treatment: Gait belt Activity Tolerance: Patient limited by fatigue;Treatment limited secondary to medical complications (Comment) Patient left: in bed;with call bell/phone within reach;with bed alarm set;with family/visitor present Nurse Communication: Mobility status PT Visit Diagnosis: Unsteadiness on feet (R26.81);Muscle weakness (generalized) (M62.81);Difficulty in walking, not elsewhere classified (R26.2);Pain Pain - Right/Left: Right Pain - part of body: Hip (back)    Time: 1459-1530 PT Time Calculation (min) (ACUTE ONLY): 31 min   Charges:   PT Evaluation $PT Eval Moderate Complexity: 1 Mod PT Treatments $Therapeutic Activity: 8-22 mins       Ramond Dial 09/09/2021, 6:57 PM  Mee Hives, PT PhD Acute Rehab Dept. Number: [place] [facility] [place]

## 2021-09-10 DIAGNOSIS — E785 Hyperlipidemia, unspecified: Secondary | ICD-10-CM | POA: Diagnosis present

## 2021-09-10 DIAGNOSIS — W1830XA Fall on same level, unspecified, initial encounter: Secondary | ICD-10-CM | POA: Diagnosis present

## 2021-09-10 DIAGNOSIS — I480 Paroxysmal atrial fibrillation: Secondary | ICD-10-CM | POA: Diagnosis present

## 2021-09-10 DIAGNOSIS — Z66 Do not resuscitate: Secondary | ICD-10-CM | POA: Diagnosis not present

## 2021-09-10 DIAGNOSIS — Z853 Personal history of malignant neoplasm of breast: Secondary | ICD-10-CM | POA: Diagnosis not present

## 2021-09-10 DIAGNOSIS — I442 Atrioventricular block, complete: Secondary | ICD-10-CM | POA: Diagnosis present

## 2021-09-10 DIAGNOSIS — R54 Age-related physical debility: Secondary | ICD-10-CM | POA: Diagnosis present

## 2021-09-10 DIAGNOSIS — W19XXXS Unspecified fall, sequela: Secondary | ICD-10-CM | POA: Diagnosis not present

## 2021-09-10 DIAGNOSIS — R296 Repeated falls: Secondary | ICD-10-CM | POA: Diagnosis present

## 2021-09-10 DIAGNOSIS — Z950 Presence of cardiac pacemaker: Secondary | ICD-10-CM | POA: Diagnosis not present

## 2021-09-10 DIAGNOSIS — S2231XA Fracture of one rib, right side, initial encounter for closed fracture: Secondary | ICD-10-CM | POA: Diagnosis not present

## 2021-09-10 DIAGNOSIS — S2241XA Multiple fractures of ribs, right side, initial encounter for closed fracture: Secondary | ICD-10-CM | POA: Diagnosis present

## 2021-09-10 DIAGNOSIS — I5181 Takotsubo syndrome: Secondary | ICD-10-CM | POA: Diagnosis present

## 2021-09-10 DIAGNOSIS — R112 Nausea with vomiting, unspecified: Secondary | ICD-10-CM | POA: Diagnosis present

## 2021-09-10 DIAGNOSIS — F319 Bipolar disorder, unspecified: Secondary | ICD-10-CM | POA: Diagnosis present

## 2021-09-10 DIAGNOSIS — D649 Anemia, unspecified: Secondary | ICD-10-CM | POA: Diagnosis not present

## 2021-09-10 DIAGNOSIS — Z8616 Personal history of COVID-19: Secondary | ICD-10-CM | POA: Diagnosis not present

## 2021-09-10 DIAGNOSIS — E119 Type 2 diabetes mellitus without complications: Secondary | ICD-10-CM | POA: Diagnosis present

## 2021-09-10 DIAGNOSIS — Z9861 Coronary angioplasty status: Secondary | ICD-10-CM | POA: Diagnosis not present

## 2021-09-10 DIAGNOSIS — Z7189 Other specified counseling: Secondary | ICD-10-CM

## 2021-09-10 DIAGNOSIS — I2510 Atherosclerotic heart disease of native coronary artery without angina pectoris: Secondary | ICD-10-CM | POA: Diagnosis present

## 2021-09-10 DIAGNOSIS — Z681 Body mass index (BMI) 19 or less, adult: Secondary | ICD-10-CM | POA: Diagnosis not present

## 2021-09-10 DIAGNOSIS — S300XXA Contusion of lower back and pelvis, initial encounter: Secondary | ICD-10-CM | POA: Diagnosis present

## 2021-09-10 DIAGNOSIS — D62 Acute posthemorrhagic anemia: Secondary | ICD-10-CM | POA: Diagnosis present

## 2021-09-10 DIAGNOSIS — F03A3 Unspecified dementia, mild, with mood disturbance: Secondary | ICD-10-CM | POA: Diagnosis present

## 2021-09-10 DIAGNOSIS — E063 Autoimmune thyroiditis: Secondary | ICD-10-CM | POA: Diagnosis present

## 2021-09-10 DIAGNOSIS — R930 Abnormal findings on diagnostic imaging of skull and head, not elsewhere classified: Secondary | ICD-10-CM | POA: Diagnosis not present

## 2021-09-10 DIAGNOSIS — I10 Essential (primary) hypertension: Secondary | ICD-10-CM | POA: Diagnosis present

## 2021-09-10 DIAGNOSIS — I4891 Unspecified atrial fibrillation: Secondary | ICD-10-CM | POA: Diagnosis not present

## 2021-09-10 DIAGNOSIS — Z20822 Contact with and (suspected) exposure to covid-19: Secondary | ICD-10-CM | POA: Diagnosis present

## 2021-09-10 LAB — GLUCOSE, CAPILLARY
Glucose-Capillary: 136 mg/dL — ABNORMAL HIGH (ref 70–99)
Glucose-Capillary: 128 mg/dL — ABNORMAL HIGH (ref 70–99)

## 2021-09-10 MED ORDER — TRAMADOL HCL 50 MG PO TABS: 25.0000 mg | ORAL_TABLET | Freq: Three times a day (TID) | ORAL | 0 refills | Status: AC | PRN

## 2021-09-10 MED ORDER — LIDOCAINE 5 % EX PTCH: 2.0000 | MEDICATED_PATCH | CUTANEOUS | 2 refills | Status: AC

## 2021-09-10 MED ORDER — SENNOSIDES-DOCUSATE SODIUM 8.6-50 MG PO TABS: 2.0000 | ORAL_TABLET | Freq: Every evening | ORAL | 2 refills | Status: AC | PRN

## 2021-09-10 MED ORDER — POLYETHYLENE GLYCOL 3350 17 G PO PACK: 17.0000 g | PACK | Freq: Two times a day (BID) | ORAL | 2 refills | Status: AC

## 2021-09-10 MED ORDER — BISACODYL 10 MG RE SUPP: 10.0000 mg | Freq: Every day | RECTAL | 0 refills | Status: AC | PRN

## 2021-09-10 MED ORDER — ACETAMINOPHEN 500 MG PO TABS: 1000.0000 mg | ORAL_TABLET | Freq: Three times a day (TID) | ORAL | 0 refills | Status: AC

## 2021-09-10 MED ORDER — DABIGATRAN ETEXILATE MESYLATE 75 MG PO CAPS: 75.0000 mg | ORAL_CAPSULE | Freq: Two times a day (BID) | ORAL | 2 refills | Status: DC

## 2021-09-10 MED ORDER — OXYBUTYNIN CHLORIDE 5 MG PO TABS
5.0000 mg | ORAL_TABLET | Freq: Every day | ORAL | Status: DC
  Filled 2021-09-10: qty 1

## 2021-09-10 NOTE — TOC Transition Note (Signed)
Transition of Care [facility]) - CM/SW Discharge Note   Patient Details  Name: Audriana Aldama MRN: [id number] Date of Birth: August 14, 1940  Transition of Care [facility]) CM/SW Contact:  Carles Collet, RN Phone Number: 09/10/2021, 10:38 AM   Clinical Narrative:   Damaris Schooner w daughter re DC plan. She will transport home, understands Alvis Lemmings will provide [facility] services, and is requesting WC as ordered. WC will be delivered to room prior to DC. [facility] notified of DC No other TOC needs identified.     Final next level of care: Home w Home Health Services Barriers to Discharge: Continued Medical Work up   Patient Goals and CMS Choice   CMS Medicare.gov Compare Post Acute Care list provided to:: Patient Represenative (must comment) Choice offered to / list presented to : Adult Children  Discharge Placement                       Discharge Plan and Services   Discharge Planning Services: CM Consult Post Acute Care Choice: Home Health, Durable Medical Equipment          DME Arranged: Walker rolling DME Agency: [employer] Date DME Agency Contacted: 09/09/21 Time DME Agency Contacted: [phone] Representative spoke with at DME Agency: [place]: PT, OT, Nurse's Aide [place] Agency: [place] Date [place]: 09/09/21 Time [place]: [id number] Representative spoke with at [place]: [place] ([place]) Interventions     Readmission Risk Interventions No flowsheet data found.

## 2021-09-10 NOTE — Discharge Summary (Signed)
PATIENT DETAILS Name: Stacy [name] Age: 81 y.o. Sex: female Date of Birth: 1941-05-24 MRN: [id number]. Admitting Physician: Jonetta Osgood, MD [id number], Claudina Lick, MD  Admit Date: 09/08/2021 Discharge date: 09/10/2021  Recommendations for Outpatient Follow-up:  Follow up with PCP in 1-2 weeks Please obtain CMP/CBC in one week Please follow up on the following pending results:  Admitted From:  Home   Disposition: Home health   Discharge Condition: fair  CODE STATUS:   Code Status: DNR   Diet recommendation:  Diet Order             Diet - low sodium heart healthy           Diet heart healthy/carb modified Room service appropriate? Yes; Fluid consistency: Thin  Diet effective now                    Brief Summary: Patient is a 81 y.o.  female with history of PAF on anticoagulation with Pradaxa, complete heart block-s/p PPM implantation, DM-2, ductal carcinoma in situ-s/p lumpectomy/radiation, recently diagnosed with a right lower lobe lung mass-presented to the ED with 2 episodes of recent fall-and associated right gluteal hematoma/right ninth and 10th rib fracture-along with worsening pain/vomiting/constipation and mild acute blood loss anemia.  See below for further details.   Significant Hospital events: 2/2>> admit to [facility] for frequent falls/right gluteal hematoma/ninth/10th rib fracture, nausea/vomiting  Significant imaging studies: 2/2>> CTA chest: No PE, subacute right 10th rib fracture.  Interval increase in right lower lobe pleural-based opacities. 2/2>> CT abdomen/pelvis: Right gluteal hematoma 6.4 x 3.3 x 3.5 cm.  Large amount of retained colonic stool. 2/2>> CT C-spine: No fracture/dislocation 2/2>> CT head: 4 mm subtle density in the anterior left sylvian fissure in the frontal lobe-artifactual per neurosurgery.  Significant microbiology data: 2/2>> COVID/influenza PCR  Procedures: None  Consults:  2/2 >>telephone consult with  neurosurgery by ED MD-no need to repeat CT head.   Brief Hospital Course: * Fall- (present on admission) Had 2 episodes of mechanical fall on Sunday and Monday with resultant right gluteal hematoma and fracture of right10th rib.  Evaluated by PT-recommendations were for CIR-however per CIR-patient does not demonstrate medical necessity for CIR admit.  Daughter is at bedside-not keen on going to SNF-they will provide 24/7 care.  Will discharge home with home health services at the request of family.  Vomiting- (present on admission) Resolved-either from constipation or from narcotics that she received in ED.  Benign abdominal exam.  Abnormal CT scan of head- (present on admission) ED MD discussed abnormal CT head findings with neurosurgeon-Dr. Cabbell-thought to be artifactual and not thought to have SAH/SDH.  Closed rib fracture- (present on admission) Likely sustained during her falls either on Sunday or Monday.  Plans are for supportive care-encourage incentive spirometry.  She is opiate nave-and gets drowsy even with tramadol.  Managed with Tylenol, Lidoderm patches-and decrease dose of tramadol 25 mg as needed.    Traumatic hematoma of buttock- (present on admission) Per daughter-size of hematoma has already decreased over the past few days-resuming Pradaxa cautiously-follow closely in the outpatient setting.    Acute posthemorrhagic anemia- (present on admission) Mild drop in hemoglobin-likely due to blood loss from hematoma in the right gluteal area.  This is already at least 38-37 days old-Per daughter size of hematoma is already decreasing.  Cautiously continuing with Pradaxa starting tomorrow-follow CBC periodically in the outpatient setting  Constipation- (present on admission) Likely due to narcotics-given 1 dose of Dulcolax  suppository with good result-having BMs now with MiraLAX/senna combination.    CAD S/P percutaneous coronary angioplasty No chest pain or shortness of  breath-she has a remote history of a PCI.  Suspect she is not on any antiplatelet agents as she is on anticoagulation.  Paroxysmal atrial fibrillation- (present on admission) Maintaining sinus rhythm-monitor on telemetry.  She has had frequent falls recently-had a fall around Thanksgiving of last year as well.  Long discussion with 2 daughters at bedside-risk/benefits of anticoagulation discussed-for now since family able to provide 24/7 care-anticoagulation being cautiously continued.  Family understands catastrophic fall risk.  They will discuss further with the cardiologist at the next appointment.  History of complete heart block-s/p Pacemaker-Medtronic-dual-chamber implantation in 2016- (present on admission) Monitored on telemetry.  DM-2 (A1c 6.2 on 09/05/2021) CBG stable with SSI-resume metformin on discharge.   Recent Labs    09/09/21 1651 09/09/21 2055 09/10/21 0729  GLUCAP 181* 152* 136*    Hyperlipidemia- (present on admission) Continue statin  2 cm pleural-based density in the right lower lobe on recent CT chest on 08/12/2021- (present on admission) Recently diagnosed with a right lung mass-is scheduled for PET scan next week.  She has a history of smoking-and has a history of breast cancer.  Followed by Dr. York Spaniel.  Bipolar disorder ([place])- (present on admission) Continue lithium-levels were stable on admission.  Hypothyroidism, acquired, autoimmune- (present on admission) Continue levothyroxine  Ductal carcinoma in situ (DCIS) of right breast- (present on admission) History of lumpectomy and radiation in 2019.  Per outpatient radiation oncology note-recent mammography on December 2022 demonstrated no evidence of malignancy.  Follows with radiation oncology-Dr. Dwana Curd.  Mild dementia ([place])- (present on admission) Per daughter-has vascular dementia-appears to be mild  DNR (do not resuscitate) discussion Multiple discussions during this hospitalization-given  poor overall prognosis frailty/advanced age-patient/family agreeable to transition to DNR status.  They will discuss with cardiology at the next visit regarding pacemaker.  Recent COVID-19 virus infection Per patient's daughter-patient was diagnosed with COVID in early January-and completed a 5-day course of Molnuparivir.  She currently does not have any symptoms.  BMI: Estimated body mass index is 16.95 kg/m as calculated from the following:   Height as of this encounter: 5\' 4"  (1.626 m).   Weight as of this encounter: 44.8 kg.   Discharge Diagnoses:  Principal Problem:   Fall Active Problems:   Vomiting   Closed rib fracture   Abnormal CT scan of head   Traumatic hematoma of buttock   Acute posthemorrhagic anemia   Constipation   CAD S/P percutaneous coronary angioplasty   Paroxysmal atrial fibrillation   History of complete heart block-s/p Pacemaker-Medtronic-dual-chamber implantation in 2016   Hyperlipidemia   DM-2 (A1c 6.2 on 09/05/2021)   2 cm pleural-based density in the right lower lobe on recent CT chest on 08/12/2021   Bipolar disorder ([place])   Hypothyroidism, acquired, autoimmune   Ductal carcinoma in situ (DCIS) of right breast   Mild dementia ([place])   Recent COVID-19 virus infection   DNR (do not resuscitate) discussion   Discharge Instructions:  Activity:  As tolerated with Full fall precautions use walker/cane & assistance as needed   Discharge Instructions     Diet - low sodium heart healthy   Complete by: As directed    Discharge instructions   Complete by: As directed    Follow with Primary MD  Binnie Rail, MD in 1-2 weeks  Please get a complete blood count and chemistry panel checked by  your Primary MD at your next visit, and again as instructed by your Primary MD.  Get Medicines reviewed and adjusted: Please take all your medications with you for your next visit with your Primary MD  Laboratory/radiological data: Please request your Primary MD  to go over all hospital tests and procedure/radiological results at the follow up, please ask your Primary MD to get all Hospital records sent to his/her office.  In some cases, they will be blood work, cultures and biopsy results pending at the time of your discharge. Please request that your primary care M.D. follows up on these results.  Also Note the following: If you experience worsening of your admission symptoms, develop shortness of breath, life threatening emergency, suicidal or homicidal thoughts you must seek medical attention immediately by calling 911 or calling your MD immediately  if symptoms less severe.  You must read complete instructions/literature along with all the possible adverse reactions/side effects for all the Medicines you take and that have been prescribed to you. Take any new Medicines after you have completely understood and accpet all the possible adverse reactions/side effects.   Do not drive when taking Pain medications or sleeping medications (Benzodaizepines)  Do not take more than prescribed Pain, Sleep and Anxiety Medications. It is not advisable to combine anxiety,sleep and pain medications without talking with your primary care practitioner  Special Instructions: If you have smoked or chewed Tobacco  in the last 2 yrs please stop smoking, stop any regular Alcohol  and or any Recreational drug use.  Wear Seat belts while driving.  Please note: You were cared for by a hospitalist during your hospital stay. Once you are discharged, your primary care physician will handle any further medical issues. Please note that NO REFILLS for any discharge medications will be authorized once you are discharged, as it is imperative that you return to your primary care physician (or establish a relationship with a primary care physician if you do not have one) for your post hospital discharge needs so that they can reassess your need for medications and monitor your lab  values.   Increase activity slowly   Complete by: As directed       Allergies as of 09/10/2021       Reactions   Penicillins Anaphylaxis, Swelling, Rash   THROAT SWELLING PATIENT HAS HAD A PCN REACTION WITH IMMEDIATE RASH, FACIAL/TONGUE/THROAT SWELLING, SOB, OR LIGHTHEADEDNESS WITH HYPOTENSION:  yes Has patient had a PCN reaction causing severe rash involving mucus membranes or skin necrosis: No Has patient had a PCN reaction that required hospitalization No Has patient had a PCN reaction occurring within the last 10 years: yes   Latex Dermatitis, Other (See Comments)   UNDOCUMENTED SEVERITY   Adhesive [tape] Rash   Codeine Nausea And Vomiting   Metronidazole Nausea And Vomiting   Other Rash, Other (See Comments)   Bandaids cause rash        Medication List     STOP taking these medications    ciprofloxacin 250 MG tablet Commonly known as: Cipro       TAKE these medications    Accu-Chek Aviva Plus test strip Generic drug: glucose blood TEST BLOOD SUGAR TWICE DAILY   acetaminophen 500 MG tablet Commonly known as: TYLENOL Take 2 tablets (1,000 mg total) by mouth every 8 (eight) hours. What changed:  how much to take when to take this reasons to take this   albuterol 108 (90 Base) MCG/ACT inhaler Commonly known as: VENTOLIN HFA  Inhale 2 puffs into the lungs every 6 (six) hours as needed for wheezing or shortness of breath.   atorvastatin 80 MG tablet Commonly known as: LIPITOR TAKE 1 TABLET DAILY ON MONDAY WEDNESDAY FRIDAY AND SUNDAY AT 6PM What changed:  how much to take how to take this when to take this additional instructions   B-D SINGLE USE SWABS REGULAR Pads USE TWICE DAILY   Biotin 10000 MCG Tabs Take 10,000 mcg by mouth at bedtime.   bisacodyl 10 MG suppository Commonly known as: DULCOLAX Place 1 suppository (10 mg total) rectally daily as needed for moderate constipation.   busPIRone 15 MG tablet Commonly known as: BUSPAR Take 15 mg by  mouth in the morning.   Calcium Carb-Cholecalciferol 600-20 MG-MCG Tabs Take 1 tablet by mouth at bedtime.   dabigatran 75 MG Caps capsule Commonly known as: PRADAXA Take 1 capsule (75 mg total) by mouth 2 (two) times daily. What changed:  medication strength how much to take   diclofenac Sodium 1 % Gel Commonly known as: VOLTAREN Apply 4 g topically 4 (four) times daily. What changed:  how much to take when to take this reasons to take this   donepezil 10 MG tablet Commonly known as: ARICEPT TAKE 1 TABLET AT BEDTIME   Ensure Take 237 mLs by mouth 2 (two) times daily between meals.   FLUoxetine 20 MG capsule Commonly known as: PROZAC Take 20 mg by mouth in the morning.   hydrocortisone 2.5 % rectal cream Commonly known as: ANUSOL-HC USE 1 APPLICATION RECTALLY TWICE DAILY What changed:  how much to take how to take this when to take this reasons to take this additional instructions   ketotifen 0.025 % ophthalmic solution Commonly known as: ZADITOR Place 1 drop into both eyes daily as needed (allergies/itching).   levothyroxine 112 MCG tablet Commonly known as: SYNTHROID Take 1.5 tablets per day on 4 days each week and one tablet per day on three days each week. What changed:  how much to take how to take this when to take this additional instructions   lidocaine 5 % Commonly known as: LIDODERM Place 2 patches onto the skin daily. Remove & Discard patch within 12 hours or as directed by MD   lithium carbonate 300 MG CR tablet Commonly known as: LITHOBID Take 300 mg by mouth at bedtime.   loratadine 10 MG tablet Commonly known as: CLARITIN Take 10 mg by mouth in the morning.   melatonin 5 MG Tabs Take 5 mg by mouth at bedtime.   metFORMIN 500 MG 24 hr tablet Commonly known as: GLUCOPHAGE-XR TAKE 1 TABLET EVERY DAY WITH BREAKFAST What changed: See the new instructions.   multivitamin with minerals Tabs tablet Take 1 tablet by mouth every  morning.   non-metallic deodorant Misc Commonly known as: ALRA Apply 1 application topically daily as needed (for dryness).   ondansetron 4 MG disintegrating tablet Commonly known as: Zofran ODT Take 1 tablet (4 mg total) by mouth every 8 (eight) hours as needed for nausea or vomiting.   oxybutynin 5 MG tablet Commonly known as: DITROPAN TAKE 1 TABLET TWICE DAILY What changed: when to take this   Polyethyl Glycol-Propyl Glycol 0.4-0.3 % Soln Place 1-2 drops into both eyes 3 (three) times daily as needed (for dry/irritated eyes.).   polyethylene glycol 17 g packet Commonly known as: MIRALAX / GLYCOLAX Take 17 g by mouth 2 (two) times daily.   Prodigy Twist Top Lancets 28G Misc CHECK BLOOD SUGAR EVERY  DAY   pyridoxine 500 MG tablet Commonly known as: B-6 Take 500 mg by mouth in the morning.   pyridOXINE 100 MG tablet Commonly known as: VITAMIN B-6 Take 100 mg by mouth at bedtime.   senna-docusate 8.6-50 MG tablet Commonly known as: Senokot-S Take 2 tablets by mouth at bedtime as needed for mild constipation.   Systane Overnight Therapy 0.3 % Gel ophthalmic ointment Generic drug: hypromellose Place 1 application into both eyes at bedtime.   traMADol 50 MG tablet Commonly known as: ULTRAM Take 0.5 tablets (25 mg total) by mouth every 8 (eight) hours as needed for severe pain. What changed: how much to take   vitamin B-1 250 MG tablet Take 250 mg by mouth at bedtime.   vitamin B-12 1000 MCG tablet Commonly known as: CYANOCOBALAMIN Take 1,000 mcg by mouth in the morning.               Durable Medical Equipment  (From admission, onward)           Start     Ordered   09/10/21 0939  For home use only DME standard manual wheelchair with seat cushion  Once       Comments: Patient suffers from physical debility which impairs their ability to perform daily activities like bathing, dressing, feeding, grooming, and toileting in the home.  A cane, crutch, or  walker will not resolve issue with performing activities of daily living. A wheelchair will allow patient to safely perform daily activities. Patient can safely propel the wheelchair in the home or has a caregiver who can provide assistance. Length of need Lifetime. Accessories: elevating leg rests (ELRs), wheel locks, extensions and anti-tippers.   09/10/21 0939   09/09/21 1639  For home use only DME Walker rolling  Once       Question Answer Comment  Walker: With 5 Inch Wheels   Patient needs a walker to treat with the following condition Generalized weakness      09/09/21 1639            Follow-up Information     Binnie Rail, MD Follow up in 1 week(s).   Specialty: Internal Medicine Contact information: [place] [address] [phone]         Deboraha Sprang, MD Follow up in 1 week(s).   Specialty: Cardiology Contact information: [address] [place] [address] [phone]                Allergies  Allergen Reactions   Penicillins Anaphylaxis, Swelling and Rash    THROAT SWELLING PATIENT HAS HAD A PCN REACTION WITH IMMEDIATE RASH, FACIAL/TONGUE/THROAT SWELLING, SOB, OR LIGHTHEADEDNESS WITH HYPOTENSION:  yes Has patient had a PCN reaction causing severe rash involving mucus membranes or skin necrosis: No Has patient had a PCN reaction that required hospitalization No Has patient had a PCN reaction occurring within the last 10 years: yes   Latex Dermatitis and Other (See Comments)    UNDOCUMENTED SEVERITY   Adhesive [Tape] Rash   Codeine Nausea And Vomiting   Metronidazole Nausea And Vomiting   Other Rash and Other (See Comments)    Bandaids cause rash     Other Procedures/Studies: DG Chest 2 View  Result Date: 09/08/2021 CLINICAL DATA:  Shortness of breath fracture right ribs EXAM: CHEST - 2 VIEW COMPARISON:  09/07/2021 FINDINGS: Cardiac size is within normal limits. Low position of diaphragms suggests  COPD. There is slightly displaced fracture in  the lateral aspect of right tenth rib. Linear densities seen in the right lower lung fields appear less prominent. There are no new infiltrates or signs of pulmonary edema. Left lateral CP angle is clear. There is no pneumothorax. Pacemaker battery is seen in the left infraclavicular region with tips leads in the right atrium and right ventricle. There is evidence of ununited fracture in the shaft of right clavicle. Pacemaker battery is seen in the left infraclavicular region. IMPRESSION: COPD. Increased interstitial markings in the right lower lung fields may suggest scarring and possibly atelectasis. There is improvement in aeration of right lower lung fields. No new focal infiltrates are seen. Fracture is seen in the lateral aspect of right tenth rib. Blunting of right lateral CP angles may suggest pleural thickening or minimal effusion. Electronically Signed   By: Elmer Picker M.D.   On: 09/08/2021 12:06   DG Ribs Bilateral W/Chest  Result Date: 09/07/2021 CLINICAL DATA:  81 year old female status post fall 2 days ago with lower rib pain. EXAM: BILATERAL RIBS AND CHEST - 4+ VIEW COMPARISON:  Chest CT 08/10/2021 and earlier. FINDINGS: Chronic pulmonary hyperinflation, centrilobular emphysema demonstrated on CT last month. Increased peripheral right lower lung reticulonodular opacity since December, probably not significantly changed from the CT last month. No right side pneumothorax. No definite effusion. Mediastinal contours remain normal with stable left chest cardiac pacemaker. Left lung remains clear. Chronic right clavicle fracture with displacement and nonunion. Mildly displaced right lateral 9th rib fracture. Nondisplaced adjacent right lateral 10th rib fracture. Some left rib detail obscured by the pacemaker. No acute left rib fracture identified. Other visible osseous structures appear intact. Negative visible bowel gas. IMPRESSION: 1. Mildly  displaced 9th and nondisplaced 10th right lateral rib fractures. 2. Emphysema with ongoing right mid and lower lung reticulonodular opacity not significantly changed from the chest CT last month (please see that report) . No pneumothorax or pleural effusion. Electronically Signed   By: Genevie Ann M.D.   On: 09/07/2021 10:06   DG Abd 1 View  Result Date: 09/08/2021 CLINICAL DATA:  Right upper quadrant abdominal pain for the past 3 days. EXAM: ABDOMEN - 1 VIEW COMPARISON:  Abdomen and pelvis CT dated 03/23/2008. FINDINGS: Normal bowel gas pattern. Prominent stool throughout the colon. Mild lumbar and lower thoracic spine scoliosis and degenerative changes. Atheromatous arterial calcifications. IMPRESSION: No acute abnormality.  Prominent stool throughout the colon. Electronically Signed   By: Claudie Revering M.D.   On: 09/08/2021 13:12   CT Head Wo Contrast  Result Date: 09/08/2021 CLINICAL DATA:  Trauma EXAM: CT HEAD WITHOUT CONTRAST TECHNIQUE: Contiguous axial images were obtained from the base of the skull through the vertex without intravenous contrast. RADIATION DOSE REDUCTION: This exam was performed according to the departmental dose-optimization program which includes automated exposure control, adjustment of the mA and/or kV according to patient size and/or use of iterative reconstruction technique. COMPARISON:  08/11/2013 FINDINGS: Brain: In the image 14 of series 3, there is 4 mm subtle focus of increased density in the left frontal lobe adjacent to the anterior left sylvian fissure. There is no surrounding edema or mass effect. Cortical sulci are prominent. There is possible small old lacunar infarct in basal ganglia on the left side. Vascular: There are scattered arterial calcifications. Skull: No fracture is seen. Sinuses/Orbits: There is mucosal thickening in the right frontal sinus and posterior sphenoid sinus. Other: None IMPRESSION: There is new 4 mm area of subtle increased density in the anterior  left sylvian  fissure in the left frontal lobe. This may suggest partial volume averaging artifact or small focus of subarachnoid hemorrhage. There is no focal edema or mass effect. There are no epidural or subdural fluid collections. Short-term follow-up CT or MRI may be considered. Atrophy.  Small-vessel disease. Imaging findings were discussed with Dr. Tinnie Gens by telephone call. Electronically Signed   By: Elmer Picker M.D.   On: 09/08/2021 13:00   CT Angio Chest PE W and/or Wo Contrast  Result Date: 09/08/2021 CLINICAL DATA:  Pulmonary embolism suspected. Concern for retroperitoneal bleed. EXAM: CT ANGIOGRAPHY CHEST CT ABDOMEN AND PELVIS WITH CONTRAST TECHNIQUE: Multidetector CT imaging of the chest was performed using the standard protocol during bolus administration of intravenous contrast. Multiplanar CT image reconstructions and MIPs were obtained to evaluate the vascular anatomy. Multidetector CT imaging of the abdomen and pelvis was performed using the standard protocol during bolus administration of intravenous contrast. RADIATION DOSE REDUCTION: This exam was performed according to the departmental dose-optimization program which includes automated exposure control, adjustment of the mA and/or kV according to patient size and/or use of iterative reconstruction technique. CONTRAST:  Intravenous contrast COMPARISON:  None. CT chest dated August 10, 2021 FINDINGS: CTA CHEST FINDINGS Cardiovascular: Satisfactory opacification of the pulmonary arteries to the segmental level. No evidence of pulmonary embolism. Normal heart size. Mild coronary artery atherosclerotic calcifications. No pericardial effusion. Mediastinum/Nodes: No enlarged mediastinal, hilar, or axillary lymph nodes. Thyroid gland, trachea, and esophagus demonstrate no significant findings. Bilateral calcified mediastinal lymph nodes. Lungs/Pleura: There are right lower lobe peribronchovascular and pleural-based opacities as well as  scattered nodular opacities in the right lower lobe with right basilar atelectasis. These no densities has increased in size and number since prior CT examination and are highly suspicious for malignant process. Moderate emphysematous changes of bilateral upper lobes. No pleural effusion or pneumothorax. Upper Abdomen: No acute abnormality. Musculoskeletal: Mildly displaced subacute fracture of the right tenth rib, unchanged. Multilevel degenerative disc disease with subchondral sclerosis. No suspicious osseous lesion. Review of the MIP images confirms the above findings. CT ABDOMEN and PELVIS FINDINGS Hepatobiliary: No focal liver abnormality is seen. No gallstones, gallbladder wall thickening, or biliary dilatation. Pancreas: Unremarkable. No pancreatic ductal dilatation or surrounding inflammatory changes. Spleen: Normal in size without focal abnormality. Adrenals/Urinary Tract: Adrenal glands are unremarkable. Kidneys are normal, without renal calculi, focal lesion, or hydronephrosis. Bladder is unremarkable. Stomach/Bowel: Stomach is within normal limits. Appendix appears normal. No evidence of bowel wall thickening, distention, or inflammatory changes. Large amount of retained colonic stool suggesting constipation. Vascular/Lymphatic: Aortic atherosclerosis. No enlarged abdominal or pelvic lymph nodes. Reproductive: Status post hysterectomy. No adnexal masses. Other: No abdominal wall hernia or abnormality. No abdominopelvic ascites. Musculoskeletal: Right gluteal region soft tissue density measuring 6.4 x 3.3 x 3.5 cm likely representing hematoma. Multilevel degenerative disc disease of the lumbar spine. IMPRESSION: 1. Interval increase in right lower lobe peribronchovascular and pleural-based opacities concerning for primary lung malignancy/metastatic disease. Differential includes infectious/inflammatory process. Follow-up examination to resolution is recommended. 2.  No evidence of pulmonary embolism. 3.   Advanced emphysematous changes. 4. Subacute fracture of the right tenth rib, as seen on prior examination. 5. Right gluteal region subcutaneous soft tissue structure, likely hematoma measuring approximately 6.4 x 3.3 x 3.5 cm. No evidence of retroperitoneal hematoma. 6.  Large amount of retained colonic stool suggesting constipation. Findings were reported to Dr. Debe Coder at approximately 4:20 p.m. on 09/08/2021. Electronically Signed   By: Keane Police D.O.   On: 09/08/2021 16:21  CT Cervical Spine Wo Contrast  Result Date: 09/08/2021 CLINICAL DATA:  Trauma, fall EXAM: CT CERVICAL SPINE WITHOUT CONTRAST TECHNIQUE: Multidetector CT imaging of the cervical spine was performed without intravenous contrast. Multiplanar CT image reconstructions were also generated. RADIATION DOSE REDUCTION: This exam was performed according to the departmental dose-optimization program which includes automated exposure control, adjustment of the mA and/or kV according to patient size and/or use of iterative reconstruction technique. COMPARISON:  None. FINDINGS: Alignment: Alignment of posterior margins of vertebral bodies is unremarkable. Skull base and vertebrae: No recent fracture is seen. There is 5 mm smooth marginated calcification posterior to the spinous process of C5 vertebra suggesting ligament calcification from previous injury. Degenerative changes are noted in the cervical spine, more prominent at C4-C5, C5-C6 and C6-C7 levels. Soft tissues and spinal canal: There is no significant spinal stenosis. Disc levels: There is encroachment of neural foramina from C4-C7 levels. Upper chest: Unremarkable. Other: Thyroid appears smaller than usual in size. IMPRESSION: No recent fracture is seen in the cervical spine. Cervical spondylosis with encroachment of neural foramina from C4-C7 levels. Electronically Signed   By: Elmer Picker M.D.   On: 09/08/2021 13:03   CT ABDOMEN PELVIS W CONTRAST  Result Date:  09/09/2021 CLINICAL DATA:  Pulmonary embolism suspected. Concern for retroperitoneal bleed. EXAM: CT ANGIOGRAPHY CHEST CT ABDOMEN AND PELVIS WITH CONTRAST TECHNIQUE: Multidetector CT imaging of the chest was performed using the standard protocol during bolus administration of intravenous contrast. Multiplanar CT image reconstructions and MIPs were obtained to evaluate the vascular anatomy. Multidetector CT imaging of the abdomen and pelvis was performed using the standard protocol during bolus administration of intravenous contrast. RADIATION DOSE REDUCTION: This exam was performed according to the departmental dose-optimization program which includes automated exposure control, adjustment of the mA and/or kV according to patient size and/or use of iterative reconstruction technique. CONTRAST:  Intravenous contrast COMPARISON:  None. CT chest dated August 10, 2021 FINDINGS: CTA CHEST FINDINGS Cardiovascular: Satisfactory opacification of the pulmonary arteries to the segmental level. No evidence of pulmonary embolism. Normal heart size. Mild coronary artery atherosclerotic calcifications. No pericardial effusion. Mediastinum/Nodes: No enlarged mediastinal, hilar, or axillary lymph nodes. Thyroid gland, trachea, and esophagus demonstrate no significant findings. Bilateral calcified mediastinal lymph nodes. Lungs/Pleura: There are right lower lobe peribronchovascular and pleural-based opacities as well as scattered nodular opacities in the right lower lobe with right basilar atelectasis. These no densities has increased in size and number since prior CT examination and are highly suspicious for malignant process. Moderate emphysematous changes of bilateral upper lobes. No pleural effusion or pneumothorax. Upper Abdomen: No acute abnormality. Musculoskeletal: Mildly displaced subacute fracture of the right tenth rib, unchanged. Multilevel degenerative disc disease with subchondral sclerosis. No suspicious osseous  lesion. Review of the MIP images confirms the above findings. CT ABDOMEN and PELVIS FINDINGS Hepatobiliary: No focal liver abnormality is seen. No gallstones, gallbladder wall thickening, or biliary dilatation. Pancreas: Unremarkable. No pancreatic ductal dilatation or surrounding inflammatory changes. Spleen: Normal in size without focal abnormality. Adrenals/Urinary Tract: Adrenal glands are unremarkable. Kidneys are normal, without renal calculi, focal lesion, or hydronephrosis. Bladder is unremarkable. Stomach/Bowel: Stomach is within normal limits. Appendix appears normal. No evidence of bowel wall thickening, distention, or inflammatory changes. Large amount of retained colonic stool suggesting constipation. Vascular/Lymphatic: Aortic atherosclerosis. No enlarged abdominal or pelvic lymph nodes. Reproductive: Status post hysterectomy. No adnexal masses. Other: No abdominal wall hernia or abnormality. No abdominopelvic ascites. Musculoskeletal: Right gluteal region soft tissue density measuring 6.4 x  3.3 x 3.5 cm likely representing hematoma. Multilevel degenerative disc disease of the lumbar spine. IMPRESSION: 1. Interval increase in right lower lobe peribronchovascular and pleural-based opacities concerning for primary lung malignancy/metastatic disease. Differential includes infectious/inflammatory process. Follow-up examination to resolution is recommended. 2.  No evidence of pulmonary embolism. 3.  Advanced emphysematous changes. 4. Subacute fracture of the right tenth rib, as seen on prior examination. 5. Right gluteal region subcutaneous soft tissue structure, likely hematoma measuring approximately 6.4 x 3.3 x 3.5 cm. No evidence of retroperitoneal hematoma. 6.  Large amount of retained colonic stool suggesting constipation. Findings were reported to Dr. Debe Coder at approximately 4:20 p.m. on 09/08/2021. Electronically Signed   By: Keane Police D.O.   On: 09/09/2021 09:40   CUP PACEART REMOTE DEVICE  CHECK  Result Date: 08/12/2021 Scheduled remote reviewed. Normal device function.  Next remote 91 days. LH    TODAY-DAY OF DISCHARGE:  Subjective:   Stacy [name] today has no headache,no chest abdominal pain,no new weakness tingling or numbness, feels much better wants to go home today.   Objective:   Blood pressure 135/63, pulse 64, temperature 97.9 F (36.6 C), temperature source Axillary, resp. rate 15, height 5\' 4"  (1.626 m), weight 44.8 kg, SpO2 93 %. No intake or output data in the 24 hours ending 09/10/21 0956 Filed Weights   09/08/21 2300  Weight: 44.8 kg    Exam: Awake Alert, Oriented *3, No new F.N deficits, Normal affect Ruby.AT,PERRAL Supple Neck,No JVD, No cervical lymphadenopathy appriciated.  Symmetrical Chest wall movement, Good air movement bilaterally, CTAB RRR,No Gallops,Rubs or new Murmurs, No Parasternal Heave +ve B.Sounds, Abd Soft, Non tender, No organomegaly appriciated, No rebound -guarding or rigidity. No Cyanosis, Clubbing or edema, No new Rash or bruise   PERTINENT RADIOLOGIC STUDIES: DG Chest 2 View  Result Date: 09/08/2021 CLINICAL DATA:  Shortness of breath fracture right ribs EXAM: CHEST - 2 VIEW COMPARISON:  09/07/2021 FINDINGS: Cardiac size is within normal limits. Low position of diaphragms suggests COPD. There is slightly displaced fracture in the lateral aspect of right tenth rib. Linear densities seen in the right lower lung fields appear less prominent. There are no new infiltrates or signs of pulmonary edema. Left lateral CP angle is clear. There is no pneumothorax. Pacemaker battery is seen in the left infraclavicular region with tips leads in the right atrium and right ventricle. There is evidence of ununited fracture in the shaft of right clavicle. Pacemaker battery is seen in the left infraclavicular region. IMPRESSION: COPD. Increased interstitial markings in the right lower lung fields may suggest scarring and possibly atelectasis. There  is improvement in aeration of right lower lung fields. No new focal infiltrates are seen. Fracture is seen in the lateral aspect of right tenth rib. Blunting of right lateral CP angles may suggest pleural thickening or minimal effusion. Electronically Signed   By: Elmer Picker M.D.   On: 09/08/2021 12:06   DG Abd 1 View  Result Date: 09/08/2021 CLINICAL DATA:  Right upper quadrant abdominal pain for the past 3 days. EXAM: ABDOMEN - 1 VIEW COMPARISON:  Abdomen and pelvis CT dated 03/23/2008. FINDINGS: Normal bowel gas pattern. Prominent stool throughout the colon. Mild lumbar and lower thoracic spine scoliosis and degenerative changes. Atheromatous arterial calcifications. IMPRESSION: No acute abnormality.  Prominent stool throughout the colon. Electronically Signed   By: Claudie Revering M.D.   On: 09/08/2021 13:12   CT Head Wo Contrast  Result Date: 09/08/2021 CLINICAL DATA:  Trauma EXAM: CT  HEAD WITHOUT CONTRAST TECHNIQUE: Contiguous axial images were obtained from the base of the skull through the vertex without intravenous contrast. RADIATION DOSE REDUCTION: This exam was performed according to the departmental dose-optimization program which includes automated exposure control, adjustment of the mA and/or kV according to patient size and/or use of iterative reconstruction technique. COMPARISON:  08/11/2013 FINDINGS: Brain: In the image 14 of series 3, there is 4 mm subtle focus of increased density in the left frontal lobe adjacent to the anterior left sylvian fissure. There is no surrounding edema or mass effect. Cortical sulci are prominent. There is possible small old lacunar infarct in basal ganglia on the left side. Vascular: There are scattered arterial calcifications. Skull: No fracture is seen. Sinuses/Orbits: There is mucosal thickening in the right frontal sinus and posterior sphenoid sinus. Other: None IMPRESSION: There is new 4 mm area of subtle increased density in the anterior left  sylvian fissure in the left frontal lobe. This may suggest partial volume averaging artifact or small focus of subarachnoid hemorrhage. There is no focal edema or mass effect. There are no epidural or subdural fluid collections. Short-term follow-up CT or MRI may be considered. Atrophy.  Small-vessel disease. Imaging findings were discussed with Dr. Tinnie Gens by telephone call. Electronically Signed   By: Elmer Picker M.D.   On: 09/08/2021 13:00   CT Angio Chest PE W and/or Wo Contrast  Result Date: 09/08/2021 CLINICAL DATA:  Pulmonary embolism suspected. Concern for retroperitoneal bleed. EXAM: CT ANGIOGRAPHY CHEST CT ABDOMEN AND PELVIS WITH CONTRAST TECHNIQUE: Multidetector CT imaging of the chest was performed using the standard protocol during bolus administration of intravenous contrast. Multiplanar CT image reconstructions and MIPs were obtained to evaluate the vascular anatomy. Multidetector CT imaging of the abdomen and pelvis was performed using the standard protocol during bolus administration of intravenous contrast. RADIATION DOSE REDUCTION: This exam was performed according to the departmental dose-optimization program which includes automated exposure control, adjustment of the mA and/or kV according to patient size and/or use of iterative reconstruction technique. CONTRAST:  Intravenous contrast COMPARISON:  None. CT chest dated August 10, 2021 FINDINGS: CTA CHEST FINDINGS Cardiovascular: Satisfactory opacification of the pulmonary arteries to the segmental level. No evidence of pulmonary embolism. Normal heart size. Mild coronary artery atherosclerotic calcifications. No pericardial effusion. Mediastinum/Nodes: No enlarged mediastinal, hilar, or axillary lymph nodes. Thyroid gland, trachea, and esophagus demonstrate no significant findings. Bilateral calcified mediastinal lymph nodes. Lungs/Pleura: There are right lower lobe peribronchovascular and pleural-based opacities as well as scattered  nodular opacities in the right lower lobe with right basilar atelectasis. These no densities has increased in size and number since prior CT examination and are highly suspicious for malignant process. Moderate emphysematous changes of bilateral upper lobes. No pleural effusion or pneumothorax. Upper Abdomen: No acute abnormality. Musculoskeletal: Mildly displaced subacute fracture of the right tenth rib, unchanged. Multilevel degenerative disc disease with subchondral sclerosis. No suspicious osseous lesion. Review of the MIP images confirms the above findings. CT ABDOMEN and PELVIS FINDINGS Hepatobiliary: No focal liver abnormality is seen. No gallstones, gallbladder wall thickening, or biliary dilatation. Pancreas: Unremarkable. No pancreatic ductal dilatation or surrounding inflammatory changes. Spleen: Normal in size without focal abnormality. Adrenals/Urinary Tract: Adrenal glands are unremarkable. Kidneys are normal, without renal calculi, focal lesion, or hydronephrosis. Bladder is unremarkable. Stomach/Bowel: Stomach is within normal limits. Appendix appears normal. No evidence of bowel wall thickening, distention, or inflammatory changes. Large amount of retained colonic stool suggesting constipation. Vascular/Lymphatic: Aortic atherosclerosis. No enlarged abdominal or  pelvic lymph nodes. Reproductive: Status post hysterectomy. No adnexal masses. Other: No abdominal wall hernia or abnormality. No abdominopelvic ascites. Musculoskeletal: Right gluteal region soft tissue density measuring 6.4 x 3.3 x 3.5 cm likely representing hematoma. Multilevel degenerative disc disease of the lumbar spine. IMPRESSION: 1. Interval increase in right lower lobe peribronchovascular and pleural-based opacities concerning for primary lung malignancy/metastatic disease. Differential includes infectious/inflammatory process. Follow-up examination to resolution is recommended. 2.  No evidence of pulmonary embolism. 3.  Advanced  emphysematous changes. 4. Subacute fracture of the right tenth rib, as seen on prior examination. 5. Right gluteal region subcutaneous soft tissue structure, likely hematoma measuring approximately 6.4 x 3.3 x 3.5 cm. No evidence of retroperitoneal hematoma. 6.  Large amount of retained colonic stool suggesting constipation. Findings were reported to Dr. Debe Coder at approximately 4:20 p.m. on 09/08/2021. Electronically Signed   By: Keane Police D.O.   On: 09/08/2021 16:21   CT Cervical Spine Wo Contrast  Result Date: 09/08/2021 CLINICAL DATA:  Trauma, fall EXAM: CT CERVICAL SPINE WITHOUT CONTRAST TECHNIQUE: Multidetector CT imaging of the cervical spine was performed without intravenous contrast. Multiplanar CT image reconstructions were also generated. RADIATION DOSE REDUCTION: This exam was performed according to the departmental dose-optimization program which includes automated exposure control, adjustment of the mA and/or kV according to patient size and/or use of iterative reconstruction technique. COMPARISON:  None. FINDINGS: Alignment: Alignment of posterior margins of vertebral bodies is unremarkable. Skull base and vertebrae: No recent fracture is seen. There is 5 mm smooth marginated calcification posterior to the spinous process of C5 vertebra suggesting ligament calcification from previous injury. Degenerative changes are noted in the cervical spine, more prominent at C4-C5, C5-C6 and C6-C7 levels. Soft tissues and spinal canal: There is no significant spinal stenosis. Disc levels: There is encroachment of neural foramina from C4-C7 levels. Upper chest: Unremarkable. Other: Thyroid appears smaller than usual in size. IMPRESSION: No recent fracture is seen in the cervical spine. Cervical spondylosis with encroachment of neural foramina from C4-C7 levels. Electronically Signed   By: Elmer Picker M.D.   On: 09/08/2021 13:03   CT ABDOMEN PELVIS W CONTRAST  Result Date: 09/09/2021 CLINICAL DATA:   Pulmonary embolism suspected. Concern for retroperitoneal bleed. EXAM: CT ANGIOGRAPHY CHEST CT ABDOMEN AND PELVIS WITH CONTRAST TECHNIQUE: Multidetector CT imaging of the chest was performed using the standard protocol during bolus administration of intravenous contrast. Multiplanar CT image reconstructions and MIPs were obtained to evaluate the vascular anatomy. Multidetector CT imaging of the abdomen and pelvis was performed using the standard protocol during bolus administration of intravenous contrast. RADIATION DOSE REDUCTION: This exam was performed according to the departmental dose-optimization program which includes automated exposure control, adjustment of the mA and/or kV according to patient size and/or use of iterative reconstruction technique. CONTRAST:  Intravenous contrast COMPARISON:  None. CT chest dated August 10, 2021 FINDINGS: CTA CHEST FINDINGS Cardiovascular: Satisfactory opacification of the pulmonary arteries to the segmental level. No evidence of pulmonary embolism. Normal heart size. Mild coronary artery atherosclerotic calcifications. No pericardial effusion. Mediastinum/Nodes: No enlarged mediastinal, hilar, or axillary lymph nodes. Thyroid gland, trachea, and esophagus demonstrate no significant findings. Bilateral calcified mediastinal lymph nodes. Lungs/Pleura: There are right lower lobe peribronchovascular and pleural-based opacities as well as scattered nodular opacities in the right lower lobe with right basilar atelectasis. These no densities has increased in size and number since prior CT examination and are highly suspicious for malignant process. Moderate emphysematous changes of bilateral upper lobes. No pleural  effusion or pneumothorax. Upper Abdomen: No acute abnormality. Musculoskeletal: Mildly displaced subacute fracture of the right tenth rib, unchanged. Multilevel degenerative disc disease with subchondral sclerosis. No suspicious osseous lesion. Review of the MIP images  confirms the above findings. CT ABDOMEN and PELVIS FINDINGS Hepatobiliary: No focal liver abnormality is seen. No gallstones, gallbladder wall thickening, or biliary dilatation. Pancreas: Unremarkable. No pancreatic ductal dilatation or surrounding inflammatory changes. Spleen: Normal in size without focal abnormality. Adrenals/Urinary Tract: Adrenal glands are unremarkable. Kidneys are normal, without renal calculi, focal lesion, or hydronephrosis. Bladder is unremarkable. Stomach/Bowel: Stomach is within normal limits. Appendix appears normal. No evidence of bowel wall thickening, distention, or inflammatory changes. Large amount of retained colonic stool suggesting constipation. Vascular/Lymphatic: Aortic atherosclerosis. No enlarged abdominal or pelvic lymph nodes. Reproductive: Status post hysterectomy. No adnexal masses. Other: No abdominal wall hernia or abnormality. No abdominopelvic ascites. Musculoskeletal: Right gluteal region soft tissue density measuring 6.4 x 3.3 x 3.5 cm likely representing hematoma. Multilevel degenerative disc disease of the lumbar spine. IMPRESSION: 1. Interval increase in right lower lobe peribronchovascular and pleural-based opacities concerning for primary lung malignancy/metastatic disease. Differential includes infectious/inflammatory process. Follow-up examination to resolution is recommended. 2.  No evidence of pulmonary embolism. 3.  Advanced emphysematous changes. 4. Subacute fracture of the right tenth rib, as seen on prior examination. 5. Right gluteal region subcutaneous soft tissue structure, likely hematoma measuring approximately 6.4 x 3.3 x 3.5 cm. No evidence of retroperitoneal hematoma. 6.  Large amount of retained colonic stool suggesting constipation. Findings were reported to Dr. Debe Coder at approximately 4:20 p.m. on 09/08/2021. Electronically Signed   By: Keane Police D.O.   On: 09/09/2021 09:40     PERTINENT LAB RESULTS: CBC: Recent Labs     09/08/21 1122 09/09/21 0105  WBC 11.6* 12.0*  HGB 10.8* 9.9*  HCT 32.6* 30.7*  PLT 164 168   CMET CMP     Component Value Date/Time   NA 136 09/09/2021 0105   NA 141 08/16/2018 0938   K 4.6 09/09/2021 0105   CL 102 09/09/2021 0105   CO2 27 09/09/2021 0105   GLUCOSE 198 (H) 09/09/2021 0105   BUN 25 (H) 09/09/2021 0105   BUN 21 08/16/2018 0938   CREATININE 1.10 (H) 09/09/2021 0105   CREATININE 0.96 (H) 06/28/2021 1005   CALCIUM 10.3 09/09/2021 0105   CALCIUM 10.4 10/25/2011 1405   PROT 5.8 (L) 09/09/2021 0105   PROT 6.2 08/16/2018 0938   ALBUMIN 3.1 (L) 09/09/2021 0105   ALBUMIN 4.3 08/16/2018 0938   AST 22 09/09/2021 0105   AST 22 10/03/2017 1526   ALT 19 09/09/2021 0105   ALT 22 10/03/2017 1526   ALKPHOS 38 09/09/2021 0105   BILITOT 0.8 09/09/2021 0105   BILITOT 0.3 08/16/2018 0938   BILITOT 0.3 10/03/2017 1526   GFRNONAA 51 (L) 09/09/2021 0105   GFRNONAA 53 (L) 10/03/2017 1526   GFRAA 59 (L) 08/16/2018 0938   GFRAA >60 10/03/2017 1526    GFR Estimated Creatinine Clearance: 28.8 mL/min (A) (by C-G formula based on SCr of 1.1 mg/dL (H)). No results for input(s): LIPASE, AMYLASE in the last 72 hours. No results for input(s): CKTOTAL, CKMB, CKMBINDEX, TROPONINI in the last 72 hours. Invalid input(s): POCBNP No results for input(s): DDIMER in the last 72 hours. No results for input(s): HGBA1C in the last 72 hours. No results for input(s): CHOL, HDL, LDLCALC, TRIG, CHOLHDL, LDLDIRECT in the last 72 hours. No results for input(s): TSH, T4TOTAL, T3FREE, THYROIDAB in  the last 72 hours.  Invalid input(s): FREET3 No results for input(s): VITAMINB12, FOLATE, FERRITIN, TIBC, IRON, RETICCTPCT in the last 72 hours. Coags: No results for input(s): INR in the last 72 hours.  Invalid input(s): PT Microbiology: Recent Results (from the past 240 hour(s))  Urine Culture     Status: None   Collection Time: 09/05/21  4:40 PM   Specimen: Blood  Result Value Ref Range Status    Source: NOT GIVEN  Final   Status: FINAL  Final   Result: No Growth  Final  Resp Panel by RT-PCR (Flu A&B, Covid) Nasopharyngeal Swab     Status: None   Collection Time: 09/08/21  7:44 PM   Specimen: Nasopharyngeal Swab; Nasopharyngeal(NP) swabs in vial transport medium  Result Value Ref Range Status   SARS Coronavirus 2 by RT PCR NEGATIVE NEGATIVE Final    Comment: (NOTE) SARS-CoV-2 target nucleic acids are NOT DETECTED.  The SARS-CoV-2 RNA is generally detectable in upper respiratory specimens during the acute phase of infection. The lowest concentration of SARS-CoV-2 viral copies this assay can detect is 138 copies/mL. A negative result does not preclude SARS-Cov-2 infection and should not be used as the sole basis for treatment or other patient management decisions. A negative result may occur with  improper specimen collection/handling, submission of specimen other than nasopharyngeal swab, presence of viral mutation(s) within the areas targeted by this assay, and inadequate number of viral copies(<138 copies/mL). A negative result must be combined with clinical observations, patient history, and epidemiological information. The expected result is Negative.  Fact Sheet for Patients:  EntrepreneurPulse.com.au  Fact Sheet for Healthcare Providers:  IncredibleEmployment.be  This test is no t yet approved or cleared by the [place] FDA and  has been authorized for detection and/or diagnosis of SARS-CoV-2 by FDA under an Emergency Use Authorization (EUA). This EUA will remain  in effect (meaning this test can be used) for the duration of the COVID-19 declaration under Section 564(b)(1) of the Act, 21 U.S.C.section 360bbb-3(b)(1), unless the authorization is terminated  or revoked sooner.       Influenza A by PCR NEGATIVE NEGATIVE Final   Influenza B by PCR NEGATIVE NEGATIVE Final    Comment: (NOTE) The Xpert Xpress SARS-CoV-2/FLU/RSV  plus assay is intended as an aid in the diagnosis of influenza from Nasopharyngeal swab specimens and should not be used as a sole basis for treatment. Nasal washings and aspirates are unacceptable for Xpert Xpress SARS-CoV-2/FLU/RSV testing.  Fact Sheet for Patients: EntrepreneurPulse.com.au  Fact Sheet for Healthcare Providers: IncredibleEmployment.be  This test is not yet approved or cleared by the [place] FDA and has been authorized for detection and/or diagnosis of SARS-CoV-2 by FDA under an Emergency Use Authorization (EUA). This EUA will remain in effect (meaning this test can be used) for the duration of the COVID-19 declaration under Section 564(b)(1) of the Act, 21 U.S.C. section 360bbb-3(b)(1), unless the authorization is terminated or revoked.  Performed at [facility], Phil [address] [place], [place] [address]     FURTHER DISCHARGE INSTRUCTIONS:  Get Medicines reviewed and adjusted: Please take all your medications with you for your next visit with your Primary MD  Laboratory/radiological data: Please request your Primary MD to go over all hospital tests and procedure/radiological results at the follow up, please ask your Primary MD to get all Hospital records sent to his/her office.  In some cases, they will be blood work, cultures and biopsy results pending at the time of  your discharge. Please request that your primary care M.D. goes through all the records of your hospital data and follows up on these results.  Also Note the following: If you experience worsening of your admission symptoms, develop shortness of breath, life threatening emergency, suicidal or homicidal thoughts you must seek medical attention immediately by calling 911 or calling your MD immediately  if symptoms less severe.  You must read complete instructions/literature along with all the possible adverse reactions/side effects for all the  Medicines you take and that have been prescribed to you. Take any new Medicines after you have completely understood and accpet all the possible adverse reactions/side effects.   Do not drive when taking Pain medications or sleeping medications (Benzodaizepines)  Do not take more than prescribed Pain, Sleep and Anxiety Medications. It is not advisable to combine anxiety,sleep and pain medications without talking with your primary care practitioner  Special Instructions: If you have smoked or chewed Tobacco  in the last 2 yrs please stop smoking, stop any regular Alcohol  and or any Recreational drug use.  Wear Seat belts while driving.  Please note: You were cared for by a hospitalist during your hospital stay. Once you are discharged, your primary care physician will handle any further medical issues. Please note that NO REFILLS for any discharge medications will be authorized once you are discharged, as it is imperative that you return to your primary care physician (or establish a relationship with a primary care physician if you do not have one) for your post hospital discharge needs so that they can reassess your need for medications and monitor your lab values.  Total Time spent coordinating discharge including counseling, education and face to face time equals greater than 30 minutes.  SignedOren Binet 09/10/2021 9:56 AM

## 2021-09-10 NOTE — Progress Notes (Signed)
Nsg Discharge Note  Admit Date:  09/08/2021 Discharge date: 09/10/2021   Stacy [name] to be D/C'd Home per MD order.  AVS completed. . Patient/caregiver able to verbalize understanding.  Discharge Medication: Allergies as of 09/10/2021       Reactions   Penicillins Anaphylaxis, Swelling, Rash   THROAT SWELLING PATIENT HAS HAD A PCN REACTION WITH IMMEDIATE RASH, FACIAL/TONGUE/THROAT SWELLING, SOB, OR LIGHTHEADEDNESS WITH HYPOTENSION:  yes Has patient had a PCN reaction causing severe rash involving mucus membranes or skin necrosis: No Has patient had a PCN reaction that required hospitalization No Has patient had a PCN reaction occurring within the last 10 years: yes   Latex Dermatitis, Other (See Comments)   UNDOCUMENTED SEVERITY   Adhesive [tape] Rash   Codeine Nausea And Vomiting   Metronidazole Nausea And Vomiting   Other Rash, Other (See Comments)   Bandaids cause rash        Medication List     STOP taking these medications    ciprofloxacin 250 MG tablet Commonly known as: Cipro       TAKE these medications    Accu-Chek Aviva Plus test strip Generic drug: glucose blood TEST BLOOD SUGAR TWICE DAILY   acetaminophen 500 MG tablet Commonly known as: TYLENOL Take 2 tablets (1,000 mg total) by mouth every 8 (eight) hours. What changed:  how much to take when to take this reasons to take this   albuterol 108 (90 Base) MCG/ACT inhaler Commonly known as: VENTOLIN HFA Inhale 2 puffs into the lungs every 6 (six) hours as needed for wheezing or shortness of breath.   atorvastatin 80 MG tablet Commonly known as: LIPITOR TAKE 1 TABLET DAILY ON MONDAY WEDNESDAY FRIDAY AND SUNDAY AT 6PM What changed:  how much to take how to take this when to take this additional instructions   B-D SINGLE USE SWABS REGULAR Pads USE TWICE DAILY   Biotin 10000 MCG Tabs Take 10,000 mcg by mouth at bedtime.   bisacodyl 10 MG suppository Commonly known as:  DULCOLAX Place 1 suppository (10 mg total) rectally daily as needed for moderate constipation.   busPIRone 15 MG tablet Commonly known as: BUSPAR Take 15 mg by mouth in the morning.   Calcium Carb-Cholecalciferol 600-20 MG-MCG Tabs Take 1 tablet by mouth at bedtime.   dabigatran 75 MG Caps capsule Commonly known as: PRADAXA Take 1 capsule (75 mg total) by mouth 2 (two) times daily. What changed:  medication strength how much to take   diclofenac Sodium 1 % Gel Commonly known as: VOLTAREN Apply 4 g topically 4 (four) times daily. What changed:  how much to take when to take this reasons to take this   donepezil 10 MG tablet Commonly known as: ARICEPT TAKE 1 TABLET AT BEDTIME   Ensure Take 237 mLs by mouth 2 (two) times daily between meals.   FLUoxetine 20 MG capsule Commonly known as: PROZAC Take 20 mg by mouth in the morning.   hydrocortisone 2.5 % rectal cream Commonly known as: ANUSOL-HC USE 1 APPLICATION RECTALLY TWICE DAILY What changed:  how much to take how to take this when to take this reasons to take this additional instructions   ketotifen 0.025 % ophthalmic solution Commonly known as: ZADITOR Place 1 drop into both eyes daily as needed (allergies/itching).   levothyroxine 112 MCG tablet Commonly known as: SYNTHROID Take 1.5 tablets per day on 4 days each week and one tablet per day on three days each week. What changed:  how  much to take how to take this when to take this additional instructions   lidocaine 5 % Commonly known as: LIDODERM Place 2 patches onto the skin daily. Remove & Discard patch within 12 hours or as directed by MD   lithium carbonate 300 MG CR tablet Commonly known as: LITHOBID Take 300 mg by mouth at bedtime.   loratadine 10 MG tablet Commonly known as: CLARITIN Take 10 mg by mouth in the morning.   melatonin 5 MG Tabs Take 5 mg by mouth at bedtime.   metFORMIN 500 MG 24 hr tablet Commonly known as:  GLUCOPHAGE-XR TAKE 1 TABLET EVERY DAY WITH BREAKFAST What changed: See the new instructions.   multivitamin with minerals Tabs tablet Take 1 tablet by mouth every morning.   non-metallic deodorant Misc Commonly known as: ALRA Apply 1 application topically daily as needed (for dryness).   ondansetron 4 MG disintegrating tablet Commonly known as: Zofran ODT Take 1 tablet (4 mg total) by mouth every 8 (eight) hours as needed for nausea or vomiting.   oxybutynin 5 MG tablet Commonly known as: DITROPAN TAKE 1 TABLET TWICE DAILY What changed: when to take this   Polyethyl Glycol-Propyl Glycol 0.4-0.3 % Soln Place 1-2 drops into both eyes 3 (three) times daily as needed (for dry/irritated eyes.).   polyethylene glycol 17 g packet Commonly known as: MIRALAX / GLYCOLAX Take 17 g by mouth 2 (two) times daily.   Prodigy Twist Top Lancets 28G Misc CHECK BLOOD SUGAR EVERY DAY   pyridoxine 500 MG tablet Commonly known as: B-6 Take 500 mg by mouth in the morning.   pyridOXINE 100 MG tablet Commonly known as: VITAMIN B-6 Take 100 mg by mouth at bedtime.   senna-docusate 8.6-50 MG tablet Commonly known as: Senokot-S Take 2 tablets by mouth at bedtime as needed for mild constipation.   Systane Overnight Therapy 0.3 % Gel ophthalmic ointment Generic drug: hypromellose Place 1 application into both eyes at bedtime.   traMADol 50 MG tablet Commonly known as: ULTRAM Take 0.5 tablets (25 mg total) by mouth every 8 (eight) hours as needed for severe pain. What changed: how much to take   vitamin B-1 250 MG tablet Take 250 mg by mouth at bedtime.   vitamin B-12 1000 MCG tablet Commonly known as: CYANOCOBALAMIN Take 1,000 mcg by mouth in the morning.               Durable Medical Equipment  (From admission, onward)           Start     Ordered   09/10/21 0939  For home use only DME standard manual wheelchair with seat cushion  Once       Comments: Patient suffers from  physical debility which impairs their ability to perform daily activities like bathing, dressing, feeding, grooming, and toileting in the home.  A cane, crutch, or walker will not resolve issue with performing activities of daily living. A wheelchair will allow patient to safely perform daily activities. Patient can safely propel the wheelchair in the home or has a caregiver who can provide assistance. Length of need Lifetime. Accessories: elevating leg rests (ELRs), wheel locks, extensions and anti-tippers.   09/10/21 0939   09/09/21 1639  For home use only DME Walker rolling  Once       Question Answer Comment  Walker: With [place]   Patient needs a walker to treat with the following condition Generalized weakness      09/09/21 1639  Discharge Assessment: Vitals:   09/10/21 0800 09/10/21 0802  BP: 135/63 135/63  Pulse: 64 64  Resp: 14 15  Temp:    SpO2: 93% 93%   Skin clean, dry and intact without evidence of skin break down, no evidence of skin tears noted. IV catheter discontinued intact. Site without signs and symptoms of complications - no redness or edema noted at insertion site, patient denies c/o pain - only slight tenderness at site.  Dressing with slight pressure applied.  D/c Instructions-Education: Discharge instructions given to patient/family with verbalized understanding. D/c education completed with patient/family including follow up instructions, medication list, d/c activities limitations if indicated, with other d/c instructions as indicated by MD - patient able to verbalize understanding, all questions fully answered. Patient instructed to return to ED, call 911, or call MD for any changes in condition.  Patient escorted via [place], and D/C home via private auto.  Stacy [name], Stacy Schimke, RN 09/10/2021 1:41 PM

## 2021-09-10 NOTE — Progress Notes (Signed)
Physical Therapy Treatment Patient Details Name: Stacy [name] MRN: [id number] DOB: 08-11-1940 Today's Date: 09/10/2021   History of Present Illness Pt is an 81 y/o female who presented to ED 2/2 after 2 recent falls with R sided gluteal hematoma, pain in R lower chest and vomiting. Pt found to have R 9th/10th rib fx, head CT negative. PMH: a fib, complete heart block-s/p PPM implantation, DM-2, ductal carcinoma in situ-s/p lumpectomy/radiation, recently diagnosed with right lung mass-scheduled for PET scan next week    PT Comments    Patient progressing towards physical therapy goals. Session focused on RW adjustment for home and transfers to/from [facility]. Patient required up to [facility] for mobility with RW. Constant cueing for hand placement during ascent/descents. Daughters present and supportive. Updated recommendation to HHPT at discharge.     Recommendations for follow up therapy are one component of a multi-disciplinary discharge planning process, led by the attending physician.  Recommendations may be updated based on patient status, additional functional criteria and insurance authorization.  Follow Up Recommendations  Home health PT     Assistance Recommended at Discharge Frequent or constant Supervision/Assistance  Patient can return home with the following A lot of help with walking and/or transfers;A lot of help with bathing/dressing/bathroom;Assistance with cooking/housework;Direct supervision/assist for medications management;Assist for transportation;Help with stairs or ramp for entrance   Equipment Recommendations  Wheelchair (measurements PT);Wheelchair cushion (measurements PT)    Recommendations for Other Services       Precautions / Restrictions Precautions Precautions: Fall;Other (comment) Precaution Comments: R rib fx (9/10) Restrictions Weight Bearing Restrictions: No     Mobility  Bed Mobility Overal bed mobility: Needs Assistance Bed Mobility: Supine  to Sit, Sit to Supine     Supine to sit: Supervision Sit to supine: Supervision        Transfers Overall transfer level: Needs assistance Equipment used: Rolling Treylin Burtch (2 wheels) Transfers: Sit to/from Stand, Bed to chair/wheelchair/BSC Sit to Stand: Min assist   Step pivot transfers: Min guard       General transfer comment: cues for hand placement. Initial posterior lean. MinA to boost up and steady. Able to take pivotal steps towards [facility] and back to bed    Ambulation/Gait                   Stairs             Wheelchair Mobility    Modified Rankin (Stroke Patients Only)       Balance Overall balance assessment: Needs assistance, History of Falls Sitting-balance support: Feet supported Sitting balance-Leahy Scale: Fair     Standing balance support: Bilateral upper extremity supported, During functional activity Standing balance-Leahy Scale: Poor Standing balance comment: reliant on UE support in standing                            Cognition Arousal/Alertness: Awake/alert Behavior During Therapy: Flat affect Overall Cognitive Status: Impaired/Different from baseline Area of Impairment: Problem solving, Awareness, Safety/judgement, Following commands, Memory, Attention                   Current Attention Level: Selective Memory: Decreased short-term memory Following Commands: Follows one step commands with increased time Safety/Judgement: Decreased awareness of deficits, Decreased awareness of safety Awareness: Intellectual, Emergent Problem Solving: Slow processing, Difficulty sequencing, Requires verbal cues, Requires tactile cues          Exercises      General Comments  Pertinent Vitals/Pain Pain Assessment Pain Assessment: Faces Faces Pain Scale: Hurts even more Pain Location: R flank Pain Descriptors / Indicators: Grimacing, Guarding Pain Intervention(s): Monitored during session, Repositioned     Home Living                          Prior Function            PT Goals (current goals can now be found in the care plan section) Acute Rehab PT Goals Patient Stated Goal: none stated PT Goal Formulation: With patient/family Time For Goal Achievement: 09/23/21 Potential to Achieve Goals: Good Progress towards PT goals: Progressing toward goals    Frequency    Min 3X/week      PT Plan Discharge plan needs to be updated    Co-evaluation              AM-PAC PT "6 Clicks" Mobility   Outcome Measure  Help needed turning from your back to your side while in a flat bed without using bedrails?: A Little Help needed moving from lying on your back to sitting on the side of a flat bed without using bedrails?: A Little Help needed moving to and from a bed to a chair (including a wheelchair)?: A Little Help needed standing up from a chair using your arms (e.g., wheelchair or bedside chair)?: A Little Help needed to walk in hospital room?: A Lot Help needed climbing 3-5 steps with a railing? : Total 6 Click Score: 15    End of Session Equipment Utilized During Treatment: Gait belt Activity Tolerance: Patient tolerated treatment well Patient left: in bed;with call bell/phone within reach;with nursing/sitter in room;with family/visitor present Nurse Communication: Mobility status PT Visit Diagnosis: Unsteadiness on feet (R26.81);Muscle weakness (generalized) (M62.81);Difficulty in walking, not elsewhere classified (R26.2);Pain Pain - Right/Left: Right     Time: 3474-2595 PT Time Calculation (min) (ACUTE ONLY): 21 min  Charges:  $Therapeutic Activity: 8-22 mins                     Taiten Brawn A. Gilford Rile PT, DPT Acute Rehabilitation Services Pager [phone] Office [phone]    Stacy [name] 09/10/2021, 11:53 AM

## 2021-09-10 NOTE — Care Management (Signed)
°  °  Durable Medical Equipment  (From admission, onward)           Start     Ordered   09/10/21 0939  For home use only DME standard manual wheelchair with seat cushion  Once       Comments: Patient suffers from physical debility which impairs their ability to perform daily activities like bathing, dressing, feeding, grooming, and toileting in the home.  A cane, crutch, or walker will not resolve issue with performing activities of daily living. A wheelchair will allow patient to safely perform daily activities. Patient can safely propel the wheelchair in the home or has a caregiver who can provide assistance. Length of need Lifetime. Accessories: elevating leg rests (ELRs), wheel locks, extensions and anti-tippers.   09/10/21 0939   09/09/21 1639  For home use only DME Walker rolling  Once       Question Answer Comment  Walker: With [place]   Patient needs a walker to treat with the following condition Generalized weakness      09/09/21 1639

## 2021-09-10 NOTE — Assessment & Plan Note (Signed)
Multiple discussions during this hospitalization-given poor overall prognosis frailty/advanced age-patient/family agreeable to transition to DNR status.  They will discuss with cardiology at the next visit regarding pacemaker.

## 2021-09-12 ENCOUNTER — Encounter: Payer: Self-pay | Admitting: Internal Medicine

## 2021-09-12 ENCOUNTER — Other Ambulatory Visit: Payer: Self-pay

## 2021-09-12 ENCOUNTER — Ambulatory Visit (HOSPITAL_COMMUNITY)
Admission: RE | Admit: 2021-09-12 | Discharge: 2021-09-12 | Disposition: A | Discharge status: Home / Self Care | Payer: Medicare HMO | Source: Ambulatory Visit | Attending: Pulmonary Disease | Admitting: Pulmonary Disease

## 2021-09-12 ENCOUNTER — Telehealth: Payer: Self-pay

## 2021-09-12 DIAGNOSIS — J90 Pleural effusion, not elsewhere classified: Secondary | ICD-10-CM | POA: Diagnosis not present

## 2021-09-12 DIAGNOSIS — R911 Solitary pulmonary nodule: Secondary | ICD-10-CM | POA: Insufficient documentation

## 2021-09-12 DIAGNOSIS — K5730 Diverticulosis of large intestine without perforation or abscess without bleeding: Secondary | ICD-10-CM | POA: Diagnosis not present

## 2021-09-12 DIAGNOSIS — I2510 Atherosclerotic heart disease of native coronary artery without angina pectoris: Secondary | ICD-10-CM | POA: Diagnosis not present

## 2021-09-12 DIAGNOSIS — K8020 Calculus of gallbladder without cholecystitis without obstruction: Secondary | ICD-10-CM | POA: Diagnosis not present

## 2021-09-12 LAB — GLUCOSE, CAPILLARY: Glucose-Capillary: 137 mg/dL — ABNORMAL HIGH (ref 70–99)

## 2021-09-12 MED ORDER — FLUDEOXYGLUCOSE F - 18 (FDG) INJECTION
5.5000 | Freq: Once | INTRAVENOUS | Status: AC | PRN
  Administered 2021-09-12: 5.11 via INTRAVENOUS

## 2021-09-12 NOTE — Telephone Encounter (Signed)
Pt's daughter calling to check status of mychart response  Daughter states she received a message from provider's [place] stating pt needs a f/u w/ provider. Informed daughter provider does not have an Environmental consultant bey the name of Rise Paganini  Daughter requesting a c/b  See below

## 2021-09-12 NOTE — Telephone Encounter (Addendum)
Transition Care Management Unsuccessful Follow-up Telephone Call  Date of discharge and from where:  [facility] 09-10-21 Dx: fall   Attempts:  1st Attempt  Reason for unsuccessful TCM follow-up call:  Left voice message  Per daughter Suanne Marker- spoke with Dr Quay Burow and was told patient did not need to be seen- no TCM was performed

## 2021-09-14 ENCOUNTER — Other Ambulatory Visit: Payer: Self-pay

## 2021-09-14 MED ORDER — DABIGATRAN ETEXILATE MESYLATE 75 MG PO CAPS: 75.0000 mg | ORAL_CAPSULE | Freq: Two times a day (BID) | ORAL | 2 refills | Status: AC

## 2021-09-14 NOTE — Telephone Encounter (Signed)
Prescription refill request for Pradaxa received.  Indication: Afib  Last office visit: 08/05/21 Caryl Comes)  Weight: 44.8kg Age: 81 Scr: 1.10 (09/09/21)  CrCl: 28.85  Per note on 09/12/21, appropriate dose. Refill sent to requested pharmacy.

## 2021-09-15 ENCOUNTER — Ambulatory Visit: Payer: Medicare HMO | Admitting: Pulmonary Disease

## 2021-09-15 ENCOUNTER — Telehealth: Payer: Self-pay | Admitting: Pulmonary Disease

## 2021-09-15 ENCOUNTER — Other Ambulatory Visit: Payer: Self-pay

## 2021-09-15 ENCOUNTER — Encounter: Payer: Self-pay | Admitting: Pulmonary Disease

## 2021-09-15 ENCOUNTER — Encounter: Payer: Self-pay | Admitting: Internal Medicine

## 2021-09-15 VITALS — BP 150/72 | HR 78 | Temp 97.6°F | Ht 64.0 in | Wt 95.0 lb

## 2021-09-15 DIAGNOSIS — R911 Solitary pulmonary nodule: Secondary | ICD-10-CM

## 2021-09-15 DIAGNOSIS — C7951 Secondary malignant neoplasm of bone: Secondary | ICD-10-CM

## 2021-09-15 DIAGNOSIS — J910 Malignant pleural effusion: Secondary | ICD-10-CM

## 2021-09-15 DIAGNOSIS — Z7901 Long term (current) use of anticoagulants: Secondary | ICD-10-CM | POA: Diagnosis not present

## 2021-09-15 DIAGNOSIS — R599 Enlarged lymph nodes, unspecified: Secondary | ICD-10-CM

## 2021-09-15 DIAGNOSIS — R942 Abnormal results of pulmonary function studies: Secondary | ICD-10-CM

## 2021-09-15 DIAGNOSIS — R222 Localized swelling, mass and lump, trunk: Secondary | ICD-10-CM

## 2021-09-15 MED ORDER — OXYCODONE-ACETAMINOPHEN 5-325 MG PO TABS: 1.0000 | ORAL_TABLET | Freq: Three times a day (TID) | ORAL | 0 refills | Status: AC | PRN

## 2021-09-15 NOTE — Patient Instructions (Addendum)
Thank you for visiting Dr. Valeta Harms at [facility]. Today we recommend the following:  Stop pradaxa today  Plans for thoracentesis on Monday   Return in about 4 days (around 09/19/2021).    Please do your part to reduce the spread of COVID-19.

## 2021-09-15 NOTE — Telephone Encounter (Signed)
Rx for percocet was sent to pharmacy for pt as well as the tramadol. Called pt's pharmacy and spoke with Stanton Kidney, pharmacist who stated she had called and spoken to pt's daughter about her new diagnosis of lung cancer and said that all was good to go with the meds for pt. Nothing further needed.
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
## 2021-09-16 ENCOUNTER — Telehealth: Payer: Self-pay | Admitting: Internal Medicine

## 2021-09-16 NOTE — Telephone Encounter (Signed)
Spoke with [place] today

## 2021-09-16 NOTE — Telephone Encounter (Signed)
Stacy [name] w/ [facility] states pts family is requesting provider to be the attending hospice provider, pt is receiving hospice at home  Phone [phone]

## 2021-09-18 ENCOUNTER — Encounter: Payer: Self-pay | Admitting: Internal Medicine

## 2021-09-19 ENCOUNTER — Ambulatory Visit: Payer: Medicare HMO | Admitting: Pulmonary Disease

## 2021-09-19 ENCOUNTER — Telehealth: Payer: Self-pay | Admitting: Pulmonary Disease

## 2021-09-19 NOTE — Telephone Encounter (Signed)
Dr Caryl Comes has called pt's daughter to personally discuss

## 2021-09-20 ENCOUNTER — Encounter (INDEPENDENT_AMBULATORY_CARE_PROVIDER_SITE_OTHER): Payer: Self-pay | Admitting: "Endocrinology

## 2021-09-20 ENCOUNTER — Telehealth (INDEPENDENT_AMBULATORY_CARE_PROVIDER_SITE_OTHER): Payer: Self-pay | Admitting: "Endocrinology

## 2021-09-20 NOTE — Telephone Encounter (Signed)
This is an FYI to the provider.

## 2021-09-20 NOTE — Telephone Encounter (Signed)
Stacy [name]'s daughter, Stacy [name], called to tell us that Stacy [name] has stage IV lung cancer metastatic to lymph nodes and bones. Stacy [name] has decided not to have any treatment. Stacy [name] is in hospice now and is declining rapidly. She is not comprehending much. Stacy [name] wanted me to know about Stacy [name]'s status and stated that she appreciated al the care I have given to mom over the years. I called Stacy [name] to express my condolences. I asked her to call me if she needs to talk. She thanked me.  Tillman Sers, MD, CDCES

## 2021-10-05 DEATH — deceased

## 2021-11-14 ENCOUNTER — Ambulatory Visit (INDEPENDENT_AMBULATORY_CARE_PROVIDER_SITE_OTHER): Payer: Medicare HMO | Admitting: "Endocrinology

## 2021-11-15 ENCOUNTER — Ambulatory Visit (INDEPENDENT_AMBULATORY_CARE_PROVIDER_SITE_OTHER): Payer: Medicare HMO | Admitting: "Endocrinology

## 2022-01-23 ENCOUNTER — Ambulatory Visit: Payer: Medicare HMO | Admitting: Internal Medicine

## 2022-02-27 ENCOUNTER — Ambulatory Visit: Payer: Self-pay | Admitting: Radiation Oncology
